# Patient Record
Sex: Female | Born: 1935 | Race: White | Hispanic: No | Smoking: Former smoker
Health system: Southern US, Community
[De-identification: ages and names within clinical notes are randomized; demographics above are authoritative.]

## PROBLEM LIST (undated history)

## (undated) DIAGNOSIS — I255 Ischemic cardiomyopathy: Secondary | ICD-10-CM

## (undated) DIAGNOSIS — I4891 Unspecified atrial fibrillation: Secondary | ICD-10-CM

## (undated) DIAGNOSIS — J449 Chronic obstructive pulmonary disease, unspecified: Secondary | ICD-10-CM

## (undated) DIAGNOSIS — I219 Acute myocardial infarction, unspecified: Secondary | ICD-10-CM

## (undated) DIAGNOSIS — B009 Herpesviral infection, unspecified: Secondary | ICD-10-CM

## (undated) DIAGNOSIS — D649 Anemia, unspecified: Secondary | ICD-10-CM

## (undated) DIAGNOSIS — I2510 Atherosclerotic heart disease of native coronary artery without angina pectoris: Secondary | ICD-10-CM

## (undated) DIAGNOSIS — E785 Hyperlipidemia, unspecified: Secondary | ICD-10-CM

## (undated) HISTORY — DX: Chronic obstructive pulmonary disease, unspecified: J44.9

## (undated) HISTORY — DX: Herpesviral infection, unspecified: B00.9

## (undated) HISTORY — DX: Anemia, unspecified: D64.9

## (undated) HISTORY — DX: Atherosclerotic heart disease of native coronary artery without angina pectoris: I25.10

## (undated) HISTORY — DX: Acute myocardial infarction, unspecified: I21.9

## (undated) HISTORY — PX: CATARACT EXTRACTION: SUR2

## (undated) HISTORY — PX: BREAST EXCISIONAL BIOPSY: SUR124

## (undated) HISTORY — DX: Unspecified atrial fibrillation: I48.91

## (undated) HISTORY — DX: Hyperlipidemia, unspecified: E78.5

## (undated) HISTORY — DX: Ischemic cardiomyopathy: I25.5

---

## 1993-07-27 DIAGNOSIS — I2510 Atherosclerotic heart disease of native coronary artery without angina pectoris: Secondary | ICD-10-CM

## 1993-07-27 HISTORY — DX: Atherosclerotic heart disease of native coronary artery without angina pectoris: I25.10

## 2004-10-16 ENCOUNTER — Ambulatory Visit: Payer: Self-pay | Admitting: Ophthalmology

## 2004-10-22 ENCOUNTER — Ambulatory Visit: Payer: Self-pay | Admitting: Ophthalmology

## 2005-01-23 ENCOUNTER — Ambulatory Visit: Payer: Self-pay | Admitting: Unknown Physician Specialty

## 2005-07-23 ENCOUNTER — Ambulatory Visit: Payer: Self-pay | Admitting: Gastroenterology

## 2006-01-24 HISTORY — PX: CAROTID ENDARTERECTOMY: SUR193

## 2006-02-18 ENCOUNTER — Ambulatory Visit: Payer: Self-pay | Admitting: Cardiology

## 2006-03-31 ENCOUNTER — Ambulatory Visit: Payer: Self-pay | Admitting: Cardiology

## 2006-04-12 ENCOUNTER — Ambulatory Visit: Payer: Self-pay | Admitting: Vascular Surgery

## 2006-04-14 ENCOUNTER — Other Ambulatory Visit: Payer: Self-pay

## 2006-04-14 ENCOUNTER — Ambulatory Visit: Payer: Self-pay | Admitting: Vascular Surgery

## 2006-04-19 ENCOUNTER — Inpatient Hospital Stay: Payer: Self-pay | Admitting: Vascular Surgery

## 2006-05-25 ENCOUNTER — Ambulatory Visit: Payer: Self-pay | Admitting: Unknown Physician Specialty

## 2007-05-27 ENCOUNTER — Ambulatory Visit: Payer: Self-pay | Admitting: Unknown Physician Specialty

## 2008-01-24 ENCOUNTER — Ambulatory Visit: Payer: Self-pay | Admitting: Unknown Physician Specialty

## 2008-10-03 ENCOUNTER — Ambulatory Visit: Payer: Self-pay | Admitting: Unknown Physician Specialty

## 2010-01-10 ENCOUNTER — Ambulatory Visit: Payer: Self-pay | Admitting: Unknown Physician Specialty

## 2010-06-26 DIAGNOSIS — I255 Ischemic cardiomyopathy: Secondary | ICD-10-CM

## 2010-06-26 HISTORY — DX: Ischemic cardiomyopathy: I25.5

## 2011-04-14 ENCOUNTER — Ambulatory Visit: Payer: Self-pay | Admitting: Unknown Physician Specialty

## 2011-04-14 LAB — HM MAMMOGRAPHY: HM Mammogram: NORMAL

## 2011-07-31 ENCOUNTER — Ambulatory Visit (INDEPENDENT_AMBULATORY_CARE_PROVIDER_SITE_OTHER): Payer: Medicare Other | Admitting: Internal Medicine

## 2011-07-31 ENCOUNTER — Encounter: Payer: Self-pay | Admitting: Internal Medicine

## 2011-07-31 DIAGNOSIS — IMO0001 Reserved for inherently not codable concepts without codable children: Secondary | ICD-10-CM

## 2011-07-31 DIAGNOSIS — I252 Old myocardial infarction: Secondary | ICD-10-CM | POA: Insufficient documentation

## 2011-07-31 DIAGNOSIS — Z1239 Encounter for other screening for malignant neoplasm of breast: Secondary | ICD-10-CM

## 2011-07-31 DIAGNOSIS — N813 Complete uterovaginal prolapse: Secondary | ICD-10-CM

## 2011-07-31 DIAGNOSIS — Z1211 Encounter for screening for malignant neoplasm of colon: Secondary | ICD-10-CM

## 2011-07-31 DIAGNOSIS — I2510 Atherosclerotic heart disease of native coronary artery without angina pectoris: Secondary | ICD-10-CM | POA: Insufficient documentation

## 2011-07-31 DIAGNOSIS — M791 Myalgia, unspecified site: Secondary | ICD-10-CM

## 2011-07-31 DIAGNOSIS — B009 Herpesviral infection, unspecified: Secondary | ICD-10-CM

## 2011-07-31 DIAGNOSIS — Z124 Encounter for screening for malignant neoplasm of cervix: Secondary | ICD-10-CM

## 2011-07-31 DIAGNOSIS — E785 Hyperlipidemia, unspecified: Secondary | ICD-10-CM

## 2011-07-31 DIAGNOSIS — D649 Anemia, unspecified: Secondary | ICD-10-CM

## 2011-07-31 MED ORDER — LISINOPRIL 40 MG PO TABS: 40.0000 mg | ORAL_TABLET | Freq: Every day | ORAL | Status: DC

## 2011-07-31 MED ORDER — ESTRADIOL 0.1 MG/GM VA CREA: 2.0000 g | TOPICAL_CREAM | VAGINAL | Status: DC

## 2011-07-31 MED ORDER — VITAMIN D3 75 MCG (3000 UT) PO TABS: 1.0000 | ORAL_TABLET | Freq: Every day | ORAL | Status: DC

## 2011-07-31 MED ORDER — ACYCLOVIR 400 MG PO TABS: 400.0000 mg | ORAL_TABLET | Freq: Every day | ORAL | Status: DC

## 2011-07-31 MED ORDER — PRAVASTATIN SODIUM 40 MG PO TABS: 40.0000 mg | ORAL_TABLET | Freq: Every day | ORAL | Status: DC

## 2011-07-31 MED ORDER — ISOSORBIDE MONONITRATE ER 30 MG PO TB24: 30.0000 mg | ORAL_TABLET | Freq: Every day | ORAL | Status: DC

## 2011-07-31 MED ORDER — FUROSEMIDE 20 MG PO TABS: 20.0000 mg | ORAL_TABLET | Freq: Every day | ORAL | Status: DC

## 2011-07-31 NOTE — Assessment & Plan Note (Addendum)
Last PAP June 2012,  [place],  Normal.  Has a pessary,put in by Patton Salles.

## 2011-07-31 NOTE — Assessment & Plan Note (Addendum)
With prior breast cyst taken out by [facility],, Last mammogram June 2012 at her annual physical

## 2011-07-31 NOTE — Progress Notes (Signed)
  Subjective:    Patient ID: Audrey Duran, female    DOB: 19-Aug-1934, 77 y.o.   MRN: [id number]  HPI   Audrey Duran is a  77 yo white female with history of glaucoma and CAD s/p AMI in 1994, managed medically by cardiologist Edmonia Lynch,  who is transferring care from [facility] due to loss of PCP [name].  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 Patton Salles.  She had not considered surgical intervention until now. She denies hematuria , frequency and back pain, but is aware of the pessary constantly and did not anticipate this feeling or discomnfort.  She denies chest pain, dyspnea with exertion, nausea, joint pain, and vision changes.     Past Medical History  Diagnosis Date  . Coronary artery disease 1995    s/p AMI , no history of stents,  [name]  . Myocardial infarction   . Hyperlipidemia    No current outpatient prescriptions on file prior to visit.    Review of Systems  Constitutional: Negative for fever, chills and unexpected weight change.  HENT: Negative for hearing loss, ear pain, nosebleeds, congestion, sore throat, facial swelling, rhinorrhea, sneezing, mouth sores, trouble swallowing, neck pain, neck stiffness, voice change, postnasal drip, sinus pressure, tinnitus and ear discharge.   Eyes: Negative for pain, discharge, redness and visual disturbance.  Respiratory: Negative for cough, chest tightness, shortness of breath, wheezing and stridor.   Cardiovascular: Negative for chest pain, palpitations and leg swelling.  Genitourinary: Positive for pelvic pain.  Musculoskeletal: Negative for myalgias and arthralgias.  Skin: Negative for color change and rash.  Neurological: Negative for  dizziness, weakness, light-headedness and headaches.  Hematological: Negative for adenopathy.       Objective:   Physical Exam  Constitutional: She is oriented to person, place, and time. She appears well-developed and well-nourished.  HENT:  Mouth/Throat: Oropharynx is clear and moist.  Eyes: EOM are normal. Pupils are equal, round, and reactive to light. No scleral icterus.  Neck: Normal range of motion. Neck supple. No JVD present. No thyromegaly present.  Cardiovascular: Normal rate, regular rhythm, normal heart sounds and intact distal pulses.   Pulmonary/Chest: Effort normal and breath sounds normal.  Abdominal: Soft. Bowel sounds are normal. She exhibits no mass. There is no tenderness.  Musculoskeletal: Normal range of motion. She exhibits no edema.  Lymphadenopathy:    She has no cervical adenopathy.  Neurological: She is alert and oriented to person, place, and time.  Skin: Skin is warm and dry.  Psychiatric: She has a normal mood and affect.          Assessment & Plan:

## 2011-07-31 NOTE — Patient Instructions (Signed)
I [facility] because [facility]

## 2011-08-01 LAB — COMPREHENSIVE METABOLIC PANEL
ALT: 13 U/L (ref 0–35)
AST: 19 U/L (ref 0–37)
Albumin: 4.1 g/dL (ref 3.5–5.2)
CO2: 19 mEq/L (ref 19–32)
Calcium: 10.3 mg/dL (ref 8.4–10.5)
Creat: 1.38 mg/dL — ABNORMAL HIGH (ref 0.50–1.10)
Total Protein: 7.1 g/dL (ref 6.0–8.3)

## 2011-08-01 LAB — CBC WITH DIFFERENTIAL/PLATELET
Basophils Relative: 1 % (ref 0–1)
Eosinophils Absolute: 0.2 10*3/uL (ref 0.0–0.7)
Eosinophils Relative: 2 % (ref 0–5)
HCT: 39.2 % (ref 36.0–46.0)
Hemoglobin: 12 g/dL (ref 12.0–15.0)
MCH: 29.2 pg (ref 26.0–34.0)
MCHC: 30.6 g/dL (ref 30.0–36.0)
MCV: 95.4 fL (ref 78.0–100.0)
Monocytes Absolute: 0.8 10*3/uL (ref 0.1–1.0)
Monocytes Relative: 8 % (ref 3–12)

## 2011-08-01 LAB — LIPID PANEL
HDL: 53 mg/dL (ref >39)
LDL Cholesterol: 92 mg/dL (ref 0–99)
Total CHOL/HDL Ratio: 2.9 Ratio
Triglycerides: 57 mg/dL (ref <150)

## 2011-08-01 LAB — IRON AND TIBC: TIBC: 341 ug/dL (ref 250–470)

## 2011-08-02 ENCOUNTER — Encounter: Payer: Self-pay | Admitting: Internal Medicine

## 2011-08-02 DIAGNOSIS — N813 Complete uterovaginal prolapse: Secondary | ICD-10-CM | POA: Insufficient documentation

## 2011-08-02 NOTE — Assessment & Plan Note (Signed)
Managed with pessary placement Sept 2012, by Patton Salles. Due to constant discomfort, we discussed urogynecologic evaluation by Dr. Doy Hutching at [facility].

## 2011-08-02 NOTE — Assessment & Plan Note (Signed)
Colnoscopy was done in 2007 at [facility], records requested.

## 2011-08-02 NOTE — Assessment & Plan Note (Addendum)
She takes pravastatin and had fasting lipids done this summer at [facility], which were repeated today.  LDL is 92.  LFTS are normal.  Denies myalgias.  Will discuss increasing ose of pravastatin to 80 mg for goal of 70

## 2011-08-02 NOTE — Assessment & Plan Note (Signed)
With prior AMI, managed medically per patient.  Records requesting . She is taking an ACE Inhibitor, aspirin, a statin, and a venodilator (Imdur) but no beta blocker due to resting bradycardia . Records requested.

## 2011-08-03 ENCOUNTER — Telehealth: Payer: Self-pay | Admitting: *Deleted

## 2011-08-03 DIAGNOSIS — B009 Herpesviral infection, unspecified: Secondary | ICD-10-CM

## 2011-08-03 MED ORDER — ACYCLOVIR 400 MG PO TABS: 400.0000 mg | ORAL_TABLET | Freq: Every day | ORAL | Status: DC

## 2011-08-03 NOTE — Telephone Encounter (Signed)
Advised pharmacy.

## 2011-08-03 NOTE — Telephone Encounter (Signed)
Audrey Duran is asking for clarification on zovirax script.  Script that was sent in has to take one tablet by mouth daily and also says to take 2 daily. Please advise.

## 2011-08-03 NOTE — Telephone Encounter (Signed)
Should be on e daily .  thanks

## 2011-08-10 ENCOUNTER — Other Ambulatory Visit: Payer: Self-pay | Admitting: *Deleted

## 2011-08-10 MED ORDER — PRAVASTATIN SODIUM 80 MG PO TABS: 80.0000 mg | ORAL_TABLET | Freq: Every day | ORAL | Status: DC

## 2011-08-18 ENCOUNTER — Other Ambulatory Visit: Payer: Self-pay | Admitting: *Deleted

## 2011-08-18 MED ORDER — PRAVASTATIN SODIUM 80 MG PO TABS: 80.0000 mg | ORAL_TABLET | Freq: Every day | ORAL | Status: DC

## 2011-08-18 NOTE — Telephone Encounter (Signed)
Faxed request from [facility] for 90 day supply.

## 2011-08-24 ENCOUNTER — Telehealth: Payer: Self-pay | Admitting: *Deleted

## 2011-08-24 NOTE — Telephone Encounter (Signed)
Pt called stating that when she got her pravastatin script it was for 80 mg's.  She says she has always taken 40 mg's.  Advised her that per her last office note Dr. Darrick Huntsman had wanted her to increase to 17 mg's daily to get her LDL down to 70.  Pt agreed, will take 80 mg's.

## 2011-10-08 ENCOUNTER — Other Ambulatory Visit: Payer: Self-pay | Admitting: Internal Medicine

## 2011-10-08 DIAGNOSIS — B009 Herpesviral infection, unspecified: Secondary | ICD-10-CM

## 2011-10-08 MED ORDER — ACYCLOVIR 400 MG PO TABS: 400.0000 mg | ORAL_TABLET | Freq: Every day | ORAL | Status: DC

## 2011-11-09 ENCOUNTER — Other Ambulatory Visit (INDEPENDENT_AMBULATORY_CARE_PROVIDER_SITE_OTHER): Payer: Medicare Other | Admitting: *Deleted

## 2011-11-09 DIAGNOSIS — E785 Hyperlipidemia, unspecified: Secondary | ICD-10-CM

## 2011-11-09 DIAGNOSIS — N289 Disorder of kidney and ureter, unspecified: Secondary | ICD-10-CM

## 2011-11-09 LAB — LIPID PANEL
Cholesterol: 131 mg/dL (ref 0–200)
LDL Cholesterol: 79 mg/dL (ref 0–99)
Triglycerides: 56 mg/dL (ref 0.0–149.0)
VLDL: 11.2 mg/dL (ref 0.0–40.0)

## 2011-11-09 LAB — COMPREHENSIVE METABOLIC PANEL
AST: 15 U/L (ref 0–37)
Alkaline Phosphatase: 57 U/L (ref 39–117)
BUN: 26 mg/dL — ABNORMAL HIGH (ref 6–23)
Creatinine, Ser: 1.1 mg/dL (ref 0.4–1.2)
Total Bilirubin: 0.4 mg/dL (ref 0.3–1.2)

## 2011-12-03 ENCOUNTER — Telehealth: Payer: Self-pay | Admitting: Internal Medicine

## 2011-12-03 DIAGNOSIS — K649 Unspecified hemorrhoids: Secondary | ICD-10-CM

## 2011-12-03 MED ORDER — HYDROCORTISONE ACETATE 25 MG RE SUPP
25.0000 mg | Freq: Two times a day (BID) | RECTAL | Status: AC
Start: 2011-12-03 — End: 2011-12-13

## 2011-12-03 NOTE — Telephone Encounter (Signed)
I have reviewed her prior office visit from January  and there is no mention of zanax on her medication list.  So I do not give out prescriptions for controlled substances over the phone.  I will be happy to dicuss this at her next visit. I will send an rx for suppositories to her pharmacy,

## 2011-12-03 NOTE — Telephone Encounter (Signed)
Patient is needing suppositories for hemorrhoids and a prescription for xanax 0.5 mg .

## 2011-12-04 MED ORDER — ALPRAZOLAM 0.5 MG PO TABS: 0.5000 mg | ORAL_TABLET | Freq: Every evening | ORAL | Status: DC | PRN

## 2011-12-04 NOTE — Telephone Encounter (Signed)
Patient stated Dr. Lin Givens prescribed xanax and she had a refill at the pharmacy so she picked that up as well as the suppositories.  I have added it to her med list.

## 2011-12-09 ENCOUNTER — Telehealth: Payer: Self-pay | Admitting: Internal Medicine

## 2011-12-09 MED ORDER — ISOSORBIDE MONONITRATE ER 60 MG PO TB24: 60.0000 mg | ORAL_TABLET | Freq: Every day | ORAL | Status: DC

## 2011-12-09 NOTE — Telephone Encounter (Signed)
Patient is asking for a refill, but has been taking two a day instead of one a day. Is it okay to fill?

## 2011-12-09 NOTE — Telephone Encounter (Signed)
Yes, i changed dose to 60 mg ,  #90 sent to [facility]

## 2011-12-09 NOTE — Telephone Encounter (Signed)
[phone] Pt called she is out of her isosorpmonotab 30mg   The rx says to take 1 day but pt has been taking 2 a day Pt is completely out of her meds [facility] [address] Please let pt know when this is called in

## 2011-12-10 ENCOUNTER — Telehealth: Payer: Self-pay | Admitting: Internal Medicine

## 2011-12-10 MED ORDER — ISOSORBIDE MONONITRATE ER 60 MG PO TB24: 60.0000 mg | ORAL_TABLET | Freq: Every day | ORAL | Status: DC

## 2011-12-10 NOTE — Telephone Encounter (Signed)
I tried calling patient to let her know I have sent in her Rx to [facility], but wanted to speak with her to ask if she needed a 30 day supply sent to [facility].  A voicemail was not set up, I will try calling again.

## 2011-12-10 NOTE — Telephone Encounter (Signed)
Patient has been taking her Generic Imdur 60 mg 24 hr. tab twice a day because she has always taken them like that ,but the prescription was written for daily and she has ran out of her medication and would like someone to call [facility] to let them know either the prescription was written wrong or let her know that the prescription has been changed and to take only one a day. Either way she will need a refill.

## 2011-12-16 NOTE — Telephone Encounter (Signed)
Patient stated the mail order has already come so she did not need the 30 day supply

## 2012-01-08 ENCOUNTER — Other Ambulatory Visit (INDEPENDENT_AMBULATORY_CARE_PROVIDER_SITE_OTHER): Payer: Medicare Other | Admitting: *Deleted

## 2012-01-08 DIAGNOSIS — N390 Urinary tract infection, site not specified: Secondary | ICD-10-CM

## 2012-01-08 LAB — POCT URINALYSIS DIPSTICK
Glucose, UA: NEGATIVE
Nitrite, UA: NEGATIVE
Urobilinogen, UA: 0.2

## 2012-01-10 LAB — URINE CULTURE

## 2012-01-11 ENCOUNTER — Other Ambulatory Visit: Payer: Self-pay | Admitting: Internal Medicine

## 2012-01-11 MED ORDER — CIPROFLOXACIN HCL 250 MG PO TABS: 250.0000 mg | ORAL_TABLET | Freq: Two times a day (BID) | ORAL | Status: AC

## 2012-01-13 ENCOUNTER — Encounter: Payer: Self-pay | Admitting: Internal Medicine

## 2012-01-13 ENCOUNTER — Ambulatory Visit (INDEPENDENT_AMBULATORY_CARE_PROVIDER_SITE_OTHER): Payer: Medicare Other | Admitting: Internal Medicine

## 2012-01-13 VITALS — BP 130/76 | HR 65 | Temp 97.5°F | Resp 16 | Wt 156.5 lb

## 2012-01-13 DIAGNOSIS — N390 Urinary tract infection, site not specified: Secondary | ICD-10-CM

## 2012-01-13 DIAGNOSIS — N760 Acute vaginitis: Secondary | ICD-10-CM

## 2012-01-13 MED ORDER — METRONIDAZOLE 500 MG PO TABS: 500.0000 mg | ORAL_TABLET | Freq: Two times a day (BID) | ORAL | Status: AC

## 2012-01-13 MED ORDER — ESTRADIOL 10 MCG VA TABS: 1.0000 | ORAL_TABLET | VAGINAL | Status: DC

## 2012-01-13 NOTE — Patient Instructions (Signed)
I am treating you with metronidazole for a vaginal infection for one week.  Once you have finished the antibiotic, you can start the estrogen vaginal tablet instead of the cream.

## 2012-01-14 LAB — POCT URINALYSIS DIPSTICK
Ketones, UA: NEGATIVE
Protein, UA: NEGATIVE
Spec Grav, UA: 1.01
pH, UA: 5.5

## 2012-01-17 ENCOUNTER — Encounter: Payer: Self-pay | Admitting: Internal Medicine

## 2012-01-17 DIAGNOSIS — I480 Paroxysmal atrial fibrillation: Secondary | ICD-10-CM | POA: Insufficient documentation

## 2012-01-17 DIAGNOSIS — D649 Anemia, unspecified: Secondary | ICD-10-CM | POA: Insufficient documentation

## 2012-01-17 DIAGNOSIS — B009 Herpesviral infection, unspecified: Secondary | ICD-10-CM | POA: Insufficient documentation

## 2012-01-17 DIAGNOSIS — J449 Chronic obstructive pulmonary disease, unspecified: Secondary | ICD-10-CM | POA: Insufficient documentation

## 2012-01-17 DIAGNOSIS — N952 Postmenopausal atrophic vaginitis: Secondary | ICD-10-CM | POA: Insufficient documentation

## 2012-01-17 DIAGNOSIS — I255 Ischemic cardiomyopathy: Secondary | ICD-10-CM | POA: Insufficient documentation

## 2012-01-17 NOTE — Assessment & Plan Note (Signed)
Secondary to fomite (pessary). Vaginal culture sent.  Will treat with metronidazole for one week.

## 2012-01-17 NOTE — Progress Notes (Signed)
Patient ID: Audrey Duran, female   DOB: May 18, 1935, 77 y.o.   MRN: [id number]   Patient Active Problem List  Diagnosis  . Coronary artery disease  . Myocardial infarction  . Hyperlipidemia  . Screening for breast cancer  . Screening for cervical cancer  . Screening for colon cancer  . 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 Vaginitis    Subjective:  CC:   Chief Complaint  Patient presents with  . Dysuria  . Urinary Frequency    HPI:   Audrey Duran a 77 y.o. female who presents Persistent vaginal discomfort .  Patient had her pessary removed by Dr. Greggory Keen and vaginal irritation was noted at the time but no antibiotcs were given. Patient has submitted a urine in recetn days which was negative for infection but returns for continued symptomss of burning .    Past Medical History  Diagnosis Date  . Coronary artery disease 1995    s/p AMI , no history of stents,  [name]  . Myocardial infarction   . Hyperlipidemia     History reviewed. No pertinent past surgical history.       The following portions of the patient's history were reviewed and updated as appropriate: Allergies, current medications, and problem list.    Review of Systems:  The remainder of a comprehensive review of systems was negative except those addressed in the HPI,     History   Social History  . Marital Status: Divorced    Spouse Name: N/A    Number of Children: N/A  . Years of Education: N/A   Occupational History  . Not on file.   Social History Main Topics  . Smoking status: Former Smoker    Quit date: 01/13/1992  . Smokeless tobacco: Never Used  . Alcohol Use: No  . Drug Use: No  . Sexually Active: Not on file   Other Topics Concern  . Not on file   Social History Narrative  . No narrative on file    Objective:  BP 130/76  Pulse 65  Temp 97.5 F (36.4 C) (Oral)  Resp 16  Wt 156 lb 8 oz (70.988 kg)  SpO2 97%  General appearance:  alert, cooperative and appears stated age Ears: normal TM's and external ear canals both ears Throat: lips, mucosa, and tongue normal; teeth and gums normal Neck: no adenopathy, no carotid bruit, supple, symmetrical, trachea midline and thyroid not enlarged, symmetric, no tenderness/mass/nodules Back: symmetric, no curvature. ROM normal. No CVA tenderness. Lungs: clear to auscultation bilaterally Heart: regular rate and rhythm, S1, S2 normal, no murmur, click, rub or gallop Abdomen: soft, non-tender; bowel sounds normal; no masses,  no organomegaly Pulses: 2+ and symmetric Skin: Skin color, texture, turgor normal. No rashes or lesions Lymph nodes: Cervical, supraclavicular, and axillary nodes normal. Pelvic,  Patchy erythema of vaginal mucosa with yellow discharge.   Assessment and Plan:  Vaginitis Secondary to fomite (pessary). Vaginal culture sent.  Will treat with metronidazole for one week.    Updated Medication List Outpatient Encounter Prescriptions as of 01/13/2012  Medication Sig Dispense Refill  . acyclovir (ZOVIRAX) 400 MG tablet Take 1 tablet (400 mg total) by mouth daily.  90 tablet  3  . aspirin 81 MG tablet Take 160 mg by mouth daily.        . carbonyl iron (FEOSOL) 45 MG TABS Take one by mouth daily      . Cholecalciferol (VITAMIN D3) 3000 UNITS TABS Take 1 tablet by  mouth daily. Take one by mouth daily  90 tablet  3  . ciprofloxacin (CIPRO) 250 MG tablet Take 1 tablet (250 mg total) by mouth 2 (two) times daily.  6 tablet  0  . cyanocobalamin 1000 MCG tablet Take one by mouth daily       . furosemide (LASIX) 20 MG tablet Take 1 tablet (20 mg total) by mouth daily. Take one half tablet by mouth daily  90 tablet  3  . isosorbide mononitrate (IMDUR) 60 MG 24 hr tablet Take 1 tablet (60 mg total) by mouth daily.  90 tablet  3  . lisinopril (PRINIVIL,ZESTRIL) 40 MG tablet Take 1 tablet (40 mg total) by mouth daily.  90 tablet  3  . magnesium oxide (MAG-OX) 400 MG tablet Take  400 mg by mouth daily.        . Omega-3 Fatty Acids (FISH OIL) 1200 MG CAPS Take one by mouth daily       . pravastatin (PRAVACHOL) 80 MG tablet Take 1 tablet (80 mg total) by mouth daily.  90 tablet  3  . TIMOLOL MALEATE PF OP Use one drop both eyes 2 times a day       . DISCONTD: estradiol (ESTRACE VAGINAL) 0.1 MG/GM vaginal cream Place 2 g vaginally 2 (two) times a week. Use 2 times a week  42.5 g  3  . Estradiol (VAGIFEM) 10 MCG TABS Place 1 tablet (10 mcg total) vaginally every 3 (three) days.  10 tablet  3  . metroNIDAZOLE (FLAGYL) 500 MG tablet Take 1 tablet (500 mg total) by mouth 2 (two) times daily.  14 tablet  0  . DISCONTD: pravastatin (PRAVACHOL) 80 MG tablet Take 1 tablet (80 mg total) by mouth daily.  90 tablet  3     Orders Placed This Encounter  Procedures  . Urine Culture  . Culture, routine-genital  . POCT Urinalysis Dipstick    No Follow-up on file.

## 2012-01-19 ENCOUNTER — Telehealth: Payer: Self-pay | Admitting: Internal Medicine

## 2012-01-19 NOTE — Telephone Encounter (Signed)
Pts drug store called and told her the med that dr Darrick Huntsman called in last Friday to [facility] would cost her 175  For 24 pills.  Pt wanted to know if she could use the estrogen cream that she already has or can you call her in something else

## 2012-01-19 NOTE — Telephone Encounter (Signed)
Yes use the estrogen cream she has.  3 times weekly

## 2012-01-19 NOTE — Telephone Encounter (Signed)
Patient notified

## 2012-01-25 HISTORY — PX: JOINT REPLACEMENT: SHX530

## 2012-02-16 ENCOUNTER — Inpatient Hospital Stay: Payer: Self-pay | Admitting: Specialist

## 2012-02-16 LAB — CBC WITH DIFFERENTIAL/PLATELET
Basophil #: 0 10*3/uL (ref 0.0–0.1)
HCT: 36.9 % (ref 35.0–47.0)
HGB: 11.7 g/dL — ABNORMAL LOW (ref 12.0–16.0)
Lymphocyte #: 0.9 10*3/uL — ABNORMAL LOW (ref 1.0–3.6)
MCH: 29.4 pg (ref 26.0–34.0)
MCV: 93 fL (ref 80–100)
Monocyte #: 0.6 x10 3/mm (ref 0.2–0.9)
Monocyte %: 3.5 %
Neutrophil #: 16.7 10*3/uL — ABNORMAL HIGH (ref 1.4–6.5)
Platelet: 182 10*3/uL (ref 150–440)
RDW: 13.7 % (ref 11.5–14.5)
WBC: 18.2 10*3/uL — ABNORMAL HIGH (ref 3.6–11.0)

## 2012-02-16 LAB — BASIC METABOLIC PANEL
Anion Gap: 6 — ABNORMAL LOW (ref 7–16)
BUN: 30 mg/dL — ABNORMAL HIGH (ref 7–18)
Chloride: 102 mmol/L (ref 98–107)
EGFR (Non-African Amer.): 43 — ABNORMAL LOW
Glucose: 148 mg/dL — ABNORMAL HIGH (ref 65–99)
Osmolality: 275 (ref 275–301)
Potassium: 4.8 mmol/L (ref 3.5–5.1)
Sodium: 133 mmol/L — ABNORMAL LOW (ref 136–145)

## 2012-02-16 LAB — CK: CK, Total: 79 U/L (ref 21–215)

## 2012-02-17 LAB — URINALYSIS, COMPLETE
Hyaline Cast: 4
Ketone: NEGATIVE
Nitrite: NEGATIVE
Ph: 5 (ref 4.5–8.0)
Protein: NEGATIVE
RBC,UR: 3 /HPF (ref 0–5)
WBC UR: 25 /HPF (ref 0–5)

## 2012-02-17 LAB — BASIC METABOLIC PANEL
BUN: 26 mg/dL — ABNORMAL HIGH (ref 7–18)
Creatinine: 1.3 mg/dL (ref 0.60–1.30)
EGFR (Non-African Amer.): 40 — ABNORMAL LOW
Glucose: 116 mg/dL — ABNORMAL HIGH (ref 65–99)
Osmolality: 270 (ref 275–301)

## 2012-02-17 LAB — CBC WITH DIFFERENTIAL/PLATELET
Eosinophil #: 0 10*3/uL (ref 0.0–0.7)
Eosinophil %: 0.3 %
HCT: 35.4 % (ref 35.0–47.0)
Lymphocyte #: 0.9 10*3/uL — ABNORMAL LOW (ref 1.0–3.6)
Lymphocyte %: 6.6 %
MCV: 91 fL (ref 80–100)
Monocyte %: 5.6 %
Neutrophil #: 11.2 10*3/uL — ABNORMAL HIGH (ref 1.4–6.5)
Neutrophil %: 87.5 %
RBC: 3.87 10*6/uL (ref 3.80–5.20)
RDW: 13.5 % (ref 11.5–14.5)
WBC: 12.8 10*3/uL — ABNORMAL HIGH (ref 3.6–11.0)

## 2012-02-17 LAB — HEMOGLOBIN A1C: Hemoglobin A1C: 5.7 % (ref 4.2–6.3)

## 2012-02-17 LAB — PROTIME-INR: Prothrombin Time: 13.7 secs (ref 11.5–14.7)

## 2012-02-17 LAB — CK TOTAL AND CKMB (NOT AT ARMC): CK-MB: 0.9 ng/mL (ref 0.5–3.6)

## 2012-02-17 LAB — MAGNESIUM: Magnesium: 1.5 mg/dL — ABNORMAL LOW

## 2012-02-17 LAB — TROPONIN I: Troponin-I: <0.02 ng/mL

## 2012-02-18 LAB — CBC WITH DIFFERENTIAL/PLATELET
Basophil %: 0.2 %
Eosinophil #: 0.1 10*3/uL (ref 0.0–0.7)
Eosinophil %: 0.9 %
HCT: 29.8 % — ABNORMAL LOW (ref 35.0–47.0)
HGB: 9.6 g/dL — ABNORMAL LOW (ref 12.0–16.0)
Lymphocyte #: 1 10*3/uL (ref 1.0–3.6)
Lymphocyte %: 7.5 %
MCHC: 32.2 g/dL (ref 32.0–36.0)
MCV: 93 fL (ref 80–100)
Monocyte %: 7.6 %
Neutrophil #: 11.3 10*3/uL — ABNORMAL HIGH (ref 1.4–6.5)
Neutrophil %: 83.8 %
RBC: 3.21 10*6/uL — ABNORMAL LOW (ref 3.80–5.20)

## 2012-02-18 LAB — BASIC METABOLIC PANEL
Calcium, Total: 8.5 mg/dL (ref 8.5–10.1)
Chloride: 102 mmol/L (ref 98–107)
Co2: 24 mmol/L (ref 21–32)
Creatinine: 1.27 mg/dL (ref 0.60–1.30)
EGFR (Non-African Amer.): 42 — ABNORMAL LOW
Glucose: 110 mg/dL — ABNORMAL HIGH (ref 65–99)
Potassium: 4.7 mmol/L (ref 3.5–5.1)
Sodium: 133 mmol/L — ABNORMAL LOW (ref 136–145)

## 2012-02-19 ENCOUNTER — Encounter: Payer: Self-pay | Admitting: Internal Medicine

## 2012-02-19 LAB — HEMOGLOBIN: HGB: 9.1 g/dL — ABNORMAL LOW (ref 12.0–16.0)

## 2012-02-25 ENCOUNTER — Encounter: Payer: Self-pay | Admitting: Internal Medicine

## 2012-04-11 ENCOUNTER — Encounter: Payer: Self-pay | Admitting: Specialist

## 2012-04-25 ENCOUNTER — Encounter: Payer: Self-pay | Admitting: Internal Medicine

## 2012-04-25 ENCOUNTER — Ambulatory Visit (INDEPENDENT_AMBULATORY_CARE_PROVIDER_SITE_OTHER): Payer: Medicare Other | Admitting: Internal Medicine

## 2012-04-25 VITALS — BP 138/72 | HR 60 | Temp 97.6°F | Ht 63.5 in | Wt 148.5 lb

## 2012-04-25 DIAGNOSIS — Z1239 Encounter for other screening for malignant neoplasm of breast: Secondary | ICD-10-CM

## 2012-04-25 DIAGNOSIS — E785 Hyperlipidemia, unspecified: Secondary | ICD-10-CM

## 2012-04-25 DIAGNOSIS — I2510 Atherosclerotic heart disease of native coronary artery without angina pectoris: Secondary | ICD-10-CM

## 2012-04-25 DIAGNOSIS — D649 Anemia, unspecified: Secondary | ICD-10-CM

## 2012-04-25 DIAGNOSIS — Z1211 Encounter for screening for malignant neoplasm of colon: Secondary | ICD-10-CM

## 2012-04-25 DIAGNOSIS — Z0000 Encounter for general adult medical examination without abnormal findings: Secondary | ICD-10-CM | POA: Insufficient documentation

## 2012-04-25 DIAGNOSIS — R5383 Other fatigue: Secondary | ICD-10-CM

## 2012-04-25 DIAGNOSIS — E559 Vitamin D deficiency, unspecified: Secondary | ICD-10-CM

## 2012-04-25 LAB — CBC WITH DIFFERENTIAL/PLATELET
Basophils Relative: 0.5 % (ref 0.0–3.0)
Eosinophils Absolute: 0.3 10*3/uL (ref 0.0–0.7)
Eosinophils Relative: 3.7 % (ref 0.0–5.0)
Hemoglobin: 12.1 g/dL (ref 12.0–15.0)
Lymphocytes Relative: 22.4 % (ref 12.0–46.0)
MCHC: 32.3 g/dL (ref 30.0–36.0)
MCV: 90.7 fl (ref 78.0–100.0)
Neutro Abs: 5.3 10*3/uL (ref 1.4–7.7)
RBC: 4.12 Mil/uL (ref 3.87–5.11)
WBC: 8.1 10*3/uL (ref 4.5–10.5)

## 2012-04-25 LAB — TSH: TSH: 3.28 u[IU]/mL (ref 0.35–5.50)

## 2012-04-25 LAB — COMPREHENSIVE METABOLIC PANEL
BUN: 15 mg/dL (ref 6–23)
CO2: 26 mEq/L (ref 19–32)
Creatinine, Ser: 1.2 mg/dL (ref 0.4–1.2)
GFR: 47.48 mL/min — ABNORMAL LOW (ref >60.00)
Glucose, Bld: 103 mg/dL — ABNORMAL HIGH (ref 70–99)
Total Bilirubin: 0.4 mg/dL (ref 0.3–1.2)
Total Protein: 7.1 g/dL (ref 6.0–8.3)

## 2012-04-25 LAB — LIPID PANEL
Cholesterol: 149 mg/dL (ref 0–200)
HDL: 48.9 mg/dL (ref >39.00)
Triglycerides: 75 mg/dL (ref 0.0–149.0)
VLDL: 15 mg/dL (ref 0.0–40.0)

## 2012-04-25 NOTE — Assessment & Plan Note (Signed)
She is currently asymptomatic and went to her total hip replacement in July with no perioperative complications.

## 2012-04-25 NOTE — Assessment & Plan Note (Signed)
Colonoscopy was normal except for large internal hemorrhoids December 2006. She will be due for repeat in 2016 if she chooses repeat

## 2012-04-25 NOTE — Assessment & Plan Note (Signed)
Breast exam was normal.  Mammogram ordered  

## 2012-04-25 NOTE — Progress Notes (Signed)
Patient ID: Audrey Duran, female   DOB: 1934/11/09, 77 y.o.   MRN: [id number]  The patient is here for annual Medicare wellness examination and management of other chronic and acute problems. She suffered a right fracture while filling up her lawn mower in July and underwent a ORIF on  02/16/12 Audrey Duran. The incident occurred at 7 PM and she was down  on the cement for 3 hrs until somebody came to her rescue. Still having pain in the morning worse when she first gets up a week and resolves by 10:30 or 11. She takes 2 ibuprofen and occasional nighttime Tylenol. Prior trial of tramadol caused nausea. She notes that her leg feels better when she has the electric blanket on but has not tried a heating pad. She gets in 3 weeks at [facility] for inpatient rehabilitation followed by home physical therapy and is still performing exercises given to her by the physical therapist. She has not fallen since July.  She does live alone has not purchased  A [employer] Alert yet.    The risk factors are reflected in the social history.  The roster of all physicians providing medical care to patient - is listed in the Snapshot section of the chart.  Activities of daily living:  The patient is 100% independent in all ADLs: dressing, toileting, feeding as well as independent mobility  Home safety : The patient has smoke detectors in the home. They wear seatbelts.  There are no firearms at home. There is no violence in the home.   There is no risks for hepatitis, STDs or HIV. There is no   history of blood transfusion. They have no travel history to infectious disease endemic areas of the world.  The patient has seen their dentist in the last six month. They have seen their eye doctor in the last year. They admit to slight hearing difficulty with regard to whispered voices and some television programs.  They have deferred audiologic testing in the last year.  They do not  have excessive sun exposure. Discussed the need for  sun protection: hats, long sleeves and use of sunscreen if there is significant sun exposure.   Diet: the importance of a healthy diet is discussed. They do have a healthy diet.  The benefits of regular aerobic exercise were discussed. She walks 4 times per week ,  20 minutes.   Depression screen: there are no signs or vegative symptoms of depression- irritability, change in appetite, anhedonia, sadness/tearfullness.  Cognitive assessment: the patient manages all their financial and personal affairs and is actively engaged. They could relate day,date,year and events; recalled 2/3 objects at 3 minutes; performed clock-face test normally.  The following portions of the patient's history were reviewed and updated as appropriate: allergies, current medications, past family history, past medical history,  past surgical history, past social history  and problem list.  Visual acuity was not assessed per patient preference since she has regular follow up with her ophthalmologist. Hearing and body mass index were assessed and reviewed.   During the course of the visit the patient was educated and counseled about appropriate screening and preventive services including : fall prevention , diabetes screening, nutrition counseling, colorectal cancer screening, and recommended immunizations.    Objective  BP 138/72  Pulse 60  Temp 97.6 F (36.4 C) (Oral)  Ht 5' 3.5" (1.613 m)  Wt 148 lb 8 oz (67.359 kg)  BMI 25.89 kg/m2  SpO2 95%  General appearance: alert, cooperative and appears  stated age Ears: normal TM's and external ear canals both ears Throat: lips, mucosa, and tongue normal; teeth and gums normal Neck: no adenopathy, no carotid bruit, supple, symmetrical, trachea midline and thyroid not enlarged, symmetric, no tenderness/mass/nodules Back: symmetric, no curvature. ROM normal. No CVA tenderness Breasts: breasts appear normal, no suspicious masses, no skin or nipple changes or axillary  nodes.. Lungs: clear to auscultation bilaterally Heart: regular rate and rhythm, S1, S2 normal, no murmur, click, rub or gallop Abdomen: soft, non-tender; bowel sounds normal; no masses,  no organomegaly Pulses: 2+ and symmetric Skin: Skin color, texture, turgor normal. No rashes or lesions Pelvic : deferred due to hip pain  Lymph nodes: Cervical, supraclavicular, and axillary nodes normal.  Assessment and plan  Hyperlipidemia  well controlled currently. LDL is 85. No changes to medication.  Coronary artery disease She is currently asymptomatic and went to her total hip replacement in July with no perioperative complications.  Screening for colon cancer Colonoscopy was normal except for large internal hemorrhoids December 2006. She will be due for repeat in 2016 if she chooses repeat  Routine general medical examination at a health care facility Breast exam was normal. Mammogram ordered.   Updated Medication List Outpatient Encounter Prescriptions as of 04/25/2012  Medication Sig Dispense Refill  . acyclovir (ZOVIRAX) 400 MG tablet Take 1 tablet (400 mg total) by mouth daily.  90 tablet  3  . aspirin 81 MG tablet Take 160 mg by mouth daily.        . carbonyl iron (FEOSOL) 45 MG TABS Take one by mouth daily      . Cholecalciferol (VITAMIN D3) 3000 UNITS TABS Take 1 tablet by mouth daily. Take one by mouth daily  90 tablet  3  . cyanocobalamin 1000 MCG tablet Take one by mouth daily       . Estradiol (VAGIFEM) 10 MCG TABS Place 1 tablet (10 mcg total) vaginally every 3 (three) days.  10 tablet  3  . furosemide (LASIX) 20 MG tablet Take 1 tablet (20 mg total) by mouth daily. Take one half tablet by mouth daily  90 tablet  3  . isosorbide mononitrate (IMDUR) 60 MG 24 hr tablet Take 1 tablet (60 mg total) by mouth daily.  90 tablet  3  . lisinopril (PRINIVIL,ZESTRIL) 40 MG tablet Take 1 tablet (40 mg total) by mouth daily.  90 tablet  3  . magnesium oxide (MAG-OX) 400 MG tablet Take 400  mg by mouth daily.        . Omega-3 Fatty Acids (FISH OIL) 1200 MG CAPS Take one by mouth daily       . pravastatin (PRAVACHOL) 80 MG tablet Take 1 tablet (80 mg total) by mouth daily.  90 tablet  3  . TIMOLOL MALEATE PF OP Use one drop both eyes 2 times a day       . traMADol (ULTRAM) 50 MG tablet

## 2012-04-25 NOTE — Assessment & Plan Note (Signed)
well controlled currently. LDL is 85. No changes to medication.

## 2012-04-25 NOTE — Patient Instructions (Signed)
You can try taking 3 ibuprofen and 1  tylenol at bedtime and once again during the day .  Please try using Debrox drops in your right ear to soften your ear wax.  Return for an ear wax flushing if no results in a week or so.

## 2012-04-26 ENCOUNTER — Other Ambulatory Visit: Payer: Medicare Other

## 2012-04-26 ENCOUNTER — Encounter: Payer: Self-pay | Admitting: Specialist

## 2012-04-26 NOTE — Addendum Note (Signed)
Addended by: Mauri Reading on: 04/26/2012 12:26 PM   Modules accepted: Orders

## 2012-04-28 LAB — URINE CULTURE

## 2012-04-29 NOTE — Progress Notes (Signed)
Quick Note:  Patient Informed and voiced understanding ______ 

## 2012-05-20 LAB — HM MAMMOGRAPHY: HM Mammogram: NORMAL

## 2012-05-25 ENCOUNTER — Ambulatory Visit: Payer: Self-pay | Admitting: Internal Medicine

## 2012-06-03 ENCOUNTER — Encounter: Payer: Self-pay | Admitting: Internal Medicine

## 2012-06-20 ENCOUNTER — Encounter: Payer: Self-pay | Admitting: Internal Medicine

## 2012-06-20 ENCOUNTER — Ambulatory Visit (INDEPENDENT_AMBULATORY_CARE_PROVIDER_SITE_OTHER): Payer: Medicare Other | Admitting: Internal Medicine

## 2012-06-20 VITALS — BP 138/64 | HR 58 | Temp 97.7°F | Resp 12 | Ht 64.0 in | Wt 148.8 lb

## 2012-06-20 DIAGNOSIS — I4891 Unspecified atrial fibrillation: Secondary | ICD-10-CM

## 2012-06-20 DIAGNOSIS — E785 Hyperlipidemia, unspecified: Secondary | ICD-10-CM

## 2012-06-20 DIAGNOSIS — S72009A Fracture of unspecified part of neck of unspecified femur, initial encounter for closed fracture: Secondary | ICD-10-CM

## 2012-06-20 DIAGNOSIS — Z13820 Encounter for screening for osteoporosis: Secondary | ICD-10-CM

## 2012-06-20 DIAGNOSIS — Z9181 History of falling: Secondary | ICD-10-CM

## 2012-06-20 DIAGNOSIS — M8580 Other specified disorders of bone density and structure, unspecified site: Secondary | ICD-10-CM | POA: Insufficient documentation

## 2012-06-20 MED ORDER — ALENDRONATE SODIUM 70 MG PO TABS
70.0000 mg | ORAL_TABLET | ORAL | Status: DC
Start: 2012-06-20 — End: 2014-11-07

## 2012-06-20 NOTE — Assessment & Plan Note (Signed)
Patient her strongly to obtain the  [employer] alert and to wear it  24/7 since she lives alone and spent 3 hours on the ground after fracturing her hip in September

## 2012-06-20 NOTE — Assessment & Plan Note (Signed)
LDL is 85 on current dose of statin. No changes today. Liver function tests are due.

## 2012-06-20 NOTE — Assessment & Plan Note (Signed)
Suspected, given her recent hip fracture which occurred while putting gas in her lawnmower. It is unclear whether he broke and which caused her fall or whether she fell and broke her hip. She is requesting therapy. We discussed starting alendronate. Baseline DEXA scan ordered. A

## 2012-06-20 NOTE — Assessment & Plan Note (Signed)
Rate control. No changes today. No anticoagulation given fall risk.

## 2012-06-20 NOTE — Progress Notes (Signed)
Patient ID: Audrey Duran, female   DOB: January 28, 1935, 77 y.o.   MRN: [id number] Patient Active Problem List  Diagnosis  . Coronary artery disease  . Myocardial infarction  . Hyperlipidemia  . Screening for breast cancer  . Screening for cervical cancer  . Screening for colon cancer  . 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 Vaginitis  . Atrial fibrillation  . Anemia  . Recurrent HSV (herpes simplex virus)  . COPD (chronic obstructive pulmonary disease)  . Ischemic cardiomyopathy  . Routine general medical examination at a health care facility  . Osteoporosis screening  . At high risk for falls    Subjective:  CC:   Chief Complaint  Patient presents with  . Follow-up    HPI:   Audrey Duran a 77 y.o. female who presents for follow up on chronic issues,  Including atrial fibrillation  cardiomyopathy and hyperlipidemia.  She is still limping from recent hip fracture in Sept which required ORIF and rehab,  Still doing home exercises. . Wants to start medication for osteoporosis.  Has not had a bone density test in several years.  Has not purchased a [employer] yet despite suffering for 3 hours  After her fall because she was alone and could not reach her phone.  Has no cell phone either .   Past Medical History  Diagnosis Date  . Coronary artery disease 1995    s/p AMI , no history of stents,  [name]  . Myocardial infarction   . Atrial fibrillation   . Anemia   . Hyperlipidemia   . Recurrent HSV (herpes simplex virus)   . COPD (chronic obstructive pulmonary disease)   . Ischemic cardiomyopathy Dec 2011    ETT Sestamibi study apical scar, no ischemia, [name]    Past Surgical History  Procedure Date  . Cataract extraction 2004, 2006  . Carotid endarterectomy July 2007    Dew, right carotid  . Joint replacement July 2013    total hip , Hyacinth Meeker    The following portions of the patient's history were reviewed and updated as appropriate:  Allergies, current medications, and problem list.    Review of Systems:   Patient denies headache, fevers, malaise, unintentional weight loss, skin rash, eye pain, sinus congestion and sinus pain, sore throat, dysphagia,  hemoptysis , cough, dyspnea, wheezing, chest pain, palpitations, orthopnea, edema, abdominal pain, nausea, melena, diarrhea, constipation, flank pain, dysuria, hematuria, urinary  Frequency, nocturia, numbness, tingling, seizures,  Focal weakness, Loss of consciousness,  Tremor, insomnia, depression, anxiety, and suicidal ideation.        History   Social History  . Marital Status: Divorced    Spouse Name: N/A    Number of Children: N/A  . Years of Education: N/A   Occupational History  . Not on file.   Social History Main Topics  . Smoking status: Former Smoker    Quit date: 01/13/1992  . Smokeless tobacco: Never Used  . Alcohol Use: No  . Drug Use: No  . Sexually Active: Not on file   Other Topics Concern  . Not on file   Social History Narrative  . No narrative on file    Objective:  BP 138/64  Pulse 58  Temp 97.7 F (36.5 C) (Oral)  Resp 12  Ht 5\' 4"  (1.626 m)  Wt 148 lb 12 oz (67.473 kg)  BMI 25.53 kg/m2  SpO2 97%  General appearance: alert, cooperative and appears stated age Ears: normal TM's and external ear  canals both ears Throat: lips, mucosa, and tongue normal; teeth and gums normal Neck: no adenopathy, no carotid bruit, supple, symmetrical, trachea midline and thyroid not enlarged, symmetric, no tenderness/mass/nodules Back: symmetric, no curvature. ROM normal. No CVA tenderness. Lungs: clear to auscultation bilaterally Heart: regular rate and rhythm, S1, S2 normal, no murmur, click, rub or gallop Abdomen: soft, non-tender; bowel sounds normal; no masses,  no organomegaly Pulses: 2+ and symmetric Skin: Skin color, texture, turgor normal. No rashes or lesions Lymph nodes: Cervical, supraclavicular, and axillary nodes  normal.  Assessment and Plan:  Atrial fibrillation Rate control. No changes today. No anticoagulation given fall risk.  Hyperlipidemia LDL is 85 on current dose of statin. No changes today. Liver function tests are due.  Osteoporosis screening Suspected, given her recent hip fracture which occurred while putting gas in her lawnmower. It is unclear whether he broke and which caused her fall or whether she fell and broke her hip. She is requesting therapy. We discussed starting alendronate. Baseline DEXA scan ordered. A  At high risk for falls Patient her strongly to obtain the  [employer] alert and to wear it  24/7 since she lives alone and spent 3 hours on the ground after fracturing her hip in September   Updated Medication List Outpatient Encounter Prescriptions as of 06/20/2012  Medication Sig Dispense Refill  . acyclovir (ZOVIRAX) 400 MG tablet Take 1 tablet (400 mg total) by mouth daily.  90 tablet  3  . aspirin 81 MG tablet Take 160 mg by mouth daily.        . carbonyl iron (FEOSOL) 45 MG TABS Take one by mouth daily      . Cholecalciferol (VITAMIN D3) 3000 UNITS TABS Take 1 tablet by mouth daily. Take one by mouth daily  90 tablet  3  . cyanocobalamin 1000 MCG tablet Take one by mouth daily       . Estradiol (VAGIFEM) 10 MCG TABS Place 1 tablet (10 mcg total) vaginally every 3 (three) days.  10 tablet  3  . furosemide (LASIX) 20 MG tablet Take 1 tablet (20 mg total) by mouth daily. Take one half tablet by mouth daily  90 tablet  3  . isosorbide mononitrate (IMDUR) 60 MG 24 hr tablet Take 1 tablet (60 mg total) by mouth daily.  90 tablet  3  . lisinopril (PRINIVIL,ZESTRIL) 40 MG tablet Take 1 tablet (40 mg total) by mouth daily.  90 tablet  3  . magnesium oxide (MAG-OX) 400 MG tablet Take 400 mg by mouth daily.        . Omega-3 Fatty Acids (FISH OIL) 1200 MG CAPS Take one by mouth daily       . pravastatin (PRAVACHOL) 80 MG tablet Take 1 tablet (80 mg total) by mouth daily.  90  tablet  3  . TIMOLOL MALEATE PF OP Use one drop both eyes 2 times a day       . traMADol (ULTRAM) 50 MG tablet       . alendronate (FOSAMAX) 70 MG tablet Take 1 tablet (70 mg total) by mouth every 7 (seven) days. Take with a full glass of water on an empty stomach.  4 tablet  11     Orders Placed This Encounter  Procedures  . DG Bone Density  . HM MAMMOGRAPHY    No Follow-up on file.

## 2012-08-01 ENCOUNTER — Other Ambulatory Visit: Payer: Self-pay | Admitting: Internal Medicine

## 2012-08-01 NOTE — Telephone Encounter (Signed)
Estrace Vag Cream 0.1 mg/gm  Place 2 grams vaginally 2 times a week  # 90 day supply   Pravastatin 80 mg tab   Take 1 by mouth daily  # 90

## 2012-08-02 NOTE — Telephone Encounter (Signed)
LMOVM for pt to return call regarding if patient is on estradiol or estrace.

## 2012-08-05 ENCOUNTER — Telehealth: Payer: Self-pay | Admitting: Internal Medicine

## 2012-08-05 NOTE — Telephone Encounter (Signed)
Have called the pt on three encounters to get this information.

## 2012-08-05 NOTE — Telephone Encounter (Signed)
[facility]  estradiol (ESTRACE VAGINAL) 0.1 MG/GM vaginal cream  #90  pravastatin (PRAVACHOL) 40 MG tablet   #90

## 2012-08-06 MED ORDER — ESTRADIOL 10 MCG VA TABS: 1.0000 | ORAL_TABLET | VAGINAL | Status: DC

## 2012-08-06 MED ORDER — PRAVASTATIN SODIUM 80 MG PO TABS: 80.0000 mg | ORAL_TABLET | Freq: Every day | ORAL | Status: DC

## 2012-08-06 NOTE — Telephone Encounter (Signed)
Ok to refill the estradiol cream for 90 days?

## 2012-08-06 NOTE — Telephone Encounter (Signed)
Refilled,

## 2012-08-12 ENCOUNTER — Other Ambulatory Visit: Payer: Self-pay | Admitting: Internal Medicine

## 2012-08-12 DIAGNOSIS — B009 Herpesviral infection, unspecified: Secondary | ICD-10-CM

## 2012-08-12 MED ORDER — FUROSEMIDE 20 MG PO TABS: 20.0000 mg | ORAL_TABLET | Freq: Every day | ORAL | Status: DC

## 2012-08-12 MED ORDER — LISINOPRIL 40 MG PO TABS: 40.0000 mg | ORAL_TABLET | Freq: Every day | ORAL | Status: DC

## 2012-08-12 MED ORDER — ACYCLOVIR 400 MG PO TABS: 400.0000 mg | ORAL_TABLET | Freq: Every day | ORAL | Status: DC

## 2012-08-12 NOTE — Telephone Encounter (Signed)
lisinopril (PRINIVIL,ZESTRIL) 40 MG tablet  # 90  furosemide (LASIX) 20 MG tablet  #90  acyclovir (ZOVIRAX) 400 MG tablet  #90

## 2012-08-12 NOTE — Telephone Encounter (Signed)
meds filled

## 2012-08-17 ENCOUNTER — Other Ambulatory Visit: Payer: Self-pay | Admitting: General Practice

## 2012-08-17 DIAGNOSIS — B009 Herpesviral infection, unspecified: Secondary | ICD-10-CM

## 2012-08-17 MED ORDER — ACYCLOVIR 400 MG PO TABS: 400.0000 mg | ORAL_TABLET | Freq: Every day | ORAL | Status: DC

## 2012-08-19 ENCOUNTER — Telehealth: Payer: Self-pay | Admitting: Internal Medicine

## 2012-08-19 NOTE — Telephone Encounter (Signed)
Ryan calling, needs verbal authorization/clarification for refill for Lasix 20 mg.  Instructions state "take 1 daily. Take 1/2 tablet daily".   [phone] or leave msg on [phone]

## 2012-08-19 NOTE — Telephone Encounter (Signed)
Form fille d out already 1/2 tablet daily

## 2012-08-19 NOTE — Telephone Encounter (Signed)
Please verify the dosage.

## 2012-09-20 ENCOUNTER — Ambulatory Visit (INDEPENDENT_AMBULATORY_CARE_PROVIDER_SITE_OTHER): Payer: Medicare Other | Admitting: Internal Medicine

## 2012-09-20 ENCOUNTER — Encounter: Payer: Self-pay | Admitting: Internal Medicine

## 2012-09-20 VITALS — BP 130/70 | HR 58 | Temp 97.9°F | Resp 16 | Wt 147.5 lb

## 2012-09-20 DIAGNOSIS — M810 Age-related osteoporosis without current pathological fracture: Secondary | ICD-10-CM

## 2012-09-20 DIAGNOSIS — E785 Hyperlipidemia, unspecified: Secondary | ICD-10-CM

## 2012-09-20 LAB — COMPREHENSIVE METABOLIC PANEL
AST: 15 U/L (ref 0–37)
Albumin: 3.6 g/dL (ref 3.5–5.2)
BUN: 35 mg/dL — ABNORMAL HIGH (ref 6–23)
CO2: 22 mEq/L (ref 19–32)
Calcium: 9.9 mg/dL (ref 8.4–10.5)
Chloride: 110 mEq/L (ref 96–112)
GFR: 38.3 mL/min — ABNORMAL LOW (ref >60.00)
Potassium: 5.7 mEq/L — ABNORMAL HIGH (ref 3.5–5.1)

## 2012-09-20 LAB — CBC WITH DIFFERENTIAL/PLATELET
Eosinophils Absolute: 0.8 10*3/uL — ABNORMAL HIGH (ref 0.0–0.7)
Eosinophils Relative: 8.9 % — ABNORMAL HIGH (ref 0.0–5.0)
MCHC: 33.3 g/dL (ref 30.0–36.0)
MCV: 91.4 fl (ref 78.0–100.0)
Monocytes Absolute: 0.6 10*3/uL (ref 0.1–1.0)
Neutrophils Relative %: 60.5 % (ref 43.0–77.0)
Platelets: 206 10*3/uL (ref 150.0–400.0)
WBC: 8.9 10*3/uL (ref 4.5–10.5)

## 2012-09-20 LAB — MAGNESIUM: Magnesium: 1.6 mg/dL (ref 1.5–2.5)

## 2012-09-20 MED ORDER — ESTRADIOL 10 MCG VA TABS: 1.0000 | ORAL_TABLET | VAGINAL | Status: DC

## 2012-09-20 MED ORDER — ALPRAZOLAM 0.5 MG PO TABS: 0.5000 mg | ORAL_TABLET | Freq: Every evening | ORAL | Status: DC | PRN

## 2012-09-20 MED ORDER — PRAVASTATIN SODIUM 80 MG PO TABS: 80.0000 mg | ORAL_TABLET | Freq: Every day | ORAL | Status: DC

## 2012-09-20 NOTE — Assessment & Plan Note (Addendum)
Currently asymptomatic .  Has appt with [name] for preoperative clearance.   Continue asa, statin and ARB

## 2012-09-20 NOTE — Patient Instructions (Addendum)
You need to walk every day to keep your hip joint in good condition and keep your muscles strong.   Take the 3 ibuprofen and a tylenol one hour before you exercise.   Pleas return the stool samples as directed.  Do not use immodium until we have contacted you with the results

## 2012-09-20 NOTE — Progress Notes (Signed)
Patient ID: Audrey Duran, female   DOB: 1934/11/25, 78 y.o.   MRN: [id number]  Patient Active Problem List  Diagnosis  . Coronary artery disease  . Myocardial infarction  . Hyperlipidemia  . Screening for breast cancer  . Screening for cervical cancer  . Screening for colon cancer  . 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 Vaginitis  . Atrial fibrillation  . Anemia  . Recurrent HSV (herpes simplex virus)  . COPD (chronic obstructive pulmonary disease)  . Ischemic cardiomyopathy  . Routine general medical examination at a health care facility  . Osteoporosis screening  . At high risk for falls  . Anxiety state, unspecified    Subjective:  CC:   Chief Complaint  Patient presents with  . Follow-up    HPI:   Audrey Duran a 78 y.o. female who presents 1) Hip pain. Since her fall resultlng in right hip replacement 6 months ago , she has not recovered fully.   With regard to walking  Still has a limp,  Trying to exercise but limited by pain in the right inguinal area which has been worse since the weather went bad for the past week..  She is taking  3 ibuprofren once or twice daily and 2 tylenol daily .  Has not been back to see the orthopedi t  2) emotional distress secondary to conflict with daughter.  She is divorced from dtrs father.  She has given her several family heirlooms that her daughter has sold rather than keep and she is very upset about it .  She has been having difficulty initiating sleep, and has tried benadryl without relief.  She is requesting  alprazolam.   3) Going to have a bladder surgery and hysterectomy by De[facility]co. .  Needs cardiology clearance,  Has appt with [name].  Has frequent UTIs .  Using  vagifem tablet started last week .  \\  4) CAD:  Asymptomatic, No recent chest pain.   5) Seen at walk in clinic for diarrhea including nocturnal episodes several weeks ago .  Still having loose stools , like water thus far today she  has had 2 occurring dailly  Since last week   Past Medical History  Diagnosis Date  . Coronary artery disease 1995    s/p AMI , no history of stents,  [name]  . Myocardial infarction   . Atrial fibrillation   . Anemia   . Hyperlipidemia   . Recurrent HSV (herpes simplex virus)   . COPD (chronic obstructive pulmonary disease)   . Ischemic cardiomyopathy Dec 2011    ETT Sestamibi study apical scar, no ischemia, [name]    Past Surgical History  Procedure Laterality Date  . Cataract extraction  2004, 2006  . Carotid endarterectomy  July 2007    Dew, right carotid  . Joint replacement  July 2013    total hip , Hyacinth Meeker       The following portions of the patient's history were reviewed and updated as appropriate: Allergies, current medications, and problem list.    Review of Systems:   Patient denies headache, fevers, malaise, unintentional weight loss, skin rash, eye pain, sinus congestion and sinus pain, sore throat, dysphagia,  hemoptysis , cough, dyspnea, wheezing, chest pain, palpitations, orthopnea, edema, abdominal pain, nausea, melena, diarrhea, constipation, flank pain, dysuria, hematuria, urinary  Frequency, nocturia, numbness, tingling, seizures,  Focal weakness, Loss of consciousness,  Tremor, insomnia, depression, anxiety, and suicidal ideation.     History  Social History  . Marital Status: Divorced    Spouse Name: N/A    Number of Children: N/A  . Years of Education: N/A   Occupational History  . Not on file.   Social History Main Topics  . Smoking status: Former Smoker    Quit date: 01/13/1992  . Smokeless tobacco: Never Used  . Alcohol Use: No  . Drug Use: No  . Sexually Active: Not on file   Other Topics Concern  . Not on file   Social History Narrative  . No narrative on file    Objective:  BP 130/70  Pulse 58  Temp(Src) 97.9 F (36.6 C) (Oral)  Resp 16  Wt 147 lb 8 oz (66.906 kg)  BMI 25.31 kg/m2  SpO2 96%  General  appearance: alert, cooperative and appears stated age Ears: normal TM's and external ear canals both ears Throat: lips, mucosa, and tongue normal; teeth and gums normal Neck: no adenopathy, no carotid bruit, supple, symmetrical, trachea midline and thyroid not enlarged, symmetric, no tenderness/mass/nodules Back: symmetric, no curvature. ROM normal. No CVA tenderness. Lungs: clear to auscultation bilaterally Heart: regular rate and rhythm, S1, S2 normal, no murmur, click, rub or gallop Abdomen: soft, non-tender; bowel sounds normal; no masses,  no organomegaly Pulses: 2+ and symmetric Skin: Skin color, texture, turgor normal. No rashes or lesions Lymph nodes: Cervical, supraclavicular, and axillary nodes normal.  Assessment and Plan:  Coronary artery disease Currently asymptomatic .  Has appt with [name] for preoperative clearance.   Continue asa, statin and ARB  Hyperlipidemia Currently on pravastatin. But according to new guidelines she needs atorvastatin or rosuvastatin but has a history of intolerance to simvastatin,  Cystocele or rectocele with complete uterine prolapse She is concerned about having her surgery on her bladder which will require hysterectomy as well.  Risks and benefits discussed   Anxiety state, unspecified Secondary to conflict with daughter.  Prn alprazolam   A total of 40 minutes was spent with patient more than half of which was spent in counseling, reviewing records from other providers and coordination of care.  Updated Medication List Outpatient Encounter Prescriptions as of 09/20/2012  Medication Sig Dispense Refill  . acyclovir (ZOVIRAX) 400 MG tablet Take 1 tablet (400 mg total) by mouth daily.  90 tablet  3  . alendronate (FOSAMAX) 70 MG tablet Take 1 tablet (70 mg total) by mouth every 7 (seven) days. Take with a full glass of water on an empty stomach.  4 tablet  11  . aspirin 81 MG tablet Take 160 mg by mouth daily.        . carbonyl iron  (FEOSOL) 45 MG TABS Take one by mouth daily      . Cholecalciferol (VITAMIN D3) 3000 UNITS TABS Take 1 tablet by mouth daily. Take one by mouth daily  90 tablet  3  . cyanocobalamin 1000 MCG tablet Take one by mouth daily       . Estradiol (VAGIFEM) 10 MCG TABS Place 1 tablet (10 mcg total) vaginally every 3 (three) days.  30 tablet  3  . furosemide (LASIX) 20 MG tablet Take 1 tablet (20 mg total) by mouth daily. Take one half tablet by mouth daily  90 tablet  3  . isosorbide mononitrate (IMDUR) 60 MG 24 hr tablet Take 1 tablet (60 mg total) by mouth daily.  90 tablet  3  . lisinopril (PRINIVIL,ZESTRIL) 40 MG tablet Take 1 tablet (40 mg total) by mouth daily.  90 tablet  3  . magnesium oxide (MAG-OX) 400 MG tablet Take 400 mg by mouth daily.        . Omega-3 Fatty Acids (FISH OIL) 1200 MG CAPS Take one by mouth daily       . pravastatin (PRAVACHOL) 80 MG tablet Take 1 tablet (80 mg total) by mouth daily.  90 tablet  3  . TIMOLOL MALEATE PF OP Use one drop both eyes 2 times a day       . traMADol (ULTRAM) 50 MG tablet       . [DISCONTINUED] pravastatin (PRAVACHOL) 80 MG tablet Take 1 tablet (80 mg total) by mouth daily.  90 tablet  3  . ALPRAZolam (XANAX) 0.5 MG tablet Take 1 tablet (0.5 mg total) by mouth at bedtime as needed for sleep.  30 tablet  3  . Estradiol 10 MCG TABS Place 1 tablet (10 mcg total) vaginally every 3 (three) days.  10 tablet  3   No facility-administered encounter medications on file as of 09/20/2012.     Orders Placed This Encounter  Procedures  . Fecal fat, qualtitative  . Stool C-Diff Toxin Assay  . Stool Giardia/Cryptosporidium  . Stool Culture  . Stool, WBC/Lactoferrin  . Comprehensive metabolic panel  . Sedimentation rate  . CBC with Differential  . Magnesium  . Vitamin D 25 hydroxy    Return in about 4 weeks (around 10/18/2012).

## 2012-09-20 NOTE — Assessment & Plan Note (Signed)
Secondary to conflict with daughter.  Prn alprazolam

## 2012-09-20 NOTE — Assessment & Plan Note (Addendum)
Currently on pravastatin. But according to new guidelines she needs atorvastatin or rosuvastatin but has a history of intolerance to simvastatin,

## 2012-09-20 NOTE — Assessment & Plan Note (Signed)
She is concerned about having her surgery on her bladder which will require hysterectomy as well.  Risks and benefits discussed

## 2012-09-21 ENCOUNTER — Other Ambulatory Visit: Payer: Self-pay | Admitting: *Deleted

## 2012-09-21 ENCOUNTER — Other Ambulatory Visit (INDEPENDENT_AMBULATORY_CARE_PROVIDER_SITE_OTHER): Payer: Medicare Other

## 2012-09-21 ENCOUNTER — Emergency Department: Payer: Self-pay | Admitting: Emergency Medicine

## 2012-09-21 DIAGNOSIS — E875 Hyperkalemia: Secondary | ICD-10-CM | POA: Insufficient documentation

## 2012-09-21 LAB — COMPREHENSIVE METABOLIC PANEL
Albumin: 3.2 g/dL — ABNORMAL LOW (ref 3.4–5.0)
Alkaline Phosphatase: 63 U/L (ref 50–136)
BUN: 48 mg/dL — ABNORMAL HIGH (ref 7–18)
Bilirubin,Total: 0.2 mg/dL (ref 0.2–1.0)
Chloride: 112 mmol/L — ABNORMAL HIGH (ref 98–107)
Co2: 21 mmol/L (ref 21–32)
EGFR (Non-African Amer.): 30 — ABNORMAL LOW
Osmolality: 288 (ref 275–301)
SGPT (ALT): 17 U/L (ref 12–78)
Sodium: 138 mmol/L (ref 136–145)
Total Protein: 6.9 g/dL (ref 6.4–8.2)

## 2012-09-21 LAB — CBC
HCT: 34.9 % — ABNORMAL LOW (ref 35.0–47.0)
HGB: 11.4 g/dL — ABNORMAL LOW (ref 12.0–16.0)
MCHC: 32.7 g/dL (ref 32.0–36.0)
Platelet: 195 10*3/uL (ref 150–440)
RBC: 3.75 10*6/uL — ABNORMAL LOW (ref 3.80–5.20)
RDW: 13.2 % (ref 11.5–14.5)
WBC: 8.4 10*3/uL (ref 3.6–11.0)

## 2012-09-21 LAB — BASIC METABOLIC PANEL
Calcium: 9.5 mg/dL (ref 8.4–10.5)
Creatinine, Ser: 1.6 mg/dL — ABNORMAL HIGH (ref 0.4–1.2)

## 2012-09-21 LAB — VITAMIN D 25 HYDROXY (VIT D DEFICIENCY, FRACTURES): Vit D, 25-Hydroxy: 45 ng/mL (ref 30–89)

## 2012-09-21 NOTE — Addendum Note (Signed)
Addended by: Montine Circle D on: 09/21/2012 10:12 AM   Modules accepted: Orders

## 2012-09-21 NOTE — Addendum Note (Signed)
Addended by: Sherlene Shams on: 09/21/2012 07:07 AM   Modules accepted: Orders

## 2012-09-22 LAB — FECAL LACTOFERRIN, QUANT: Lactoferrin: POSITIVE

## 2012-09-22 LAB — GIARDIA/CRYPTOSPORIDIUM (EIA): Giardia Screen (EIA): NEGATIVE

## 2012-09-23 LAB — FECAL FAT, QUALITATIVE
Fat Qual Neutral, Stl: NORMAL
Fat Qual Total, Stl: NORMAL

## 2012-09-25 LAB — STOOL CULTURE

## 2012-09-26 ENCOUNTER — Telehealth: Payer: Self-pay | Admitting: *Deleted

## 2012-09-26 ENCOUNTER — Ambulatory Visit: Payer: Self-pay | Admitting: Internal Medicine

## 2012-09-26 NOTE — Telephone Encounter (Signed)
Patient lab appointment scheduled for 09-30-12

## 2012-09-26 NOTE — Telephone Encounter (Signed)
Patient walked into office asking about her labs this morning will call after mammogram

## 2012-09-29 ENCOUNTER — Ambulatory Visit (INDEPENDENT_AMBULATORY_CARE_PROVIDER_SITE_OTHER): Payer: Medicare Other | Admitting: *Deleted

## 2012-09-29 ENCOUNTER — Other Ambulatory Visit (INDEPENDENT_AMBULATORY_CARE_PROVIDER_SITE_OTHER): Payer: Medicare Other

## 2012-09-29 DIAGNOSIS — R5381 Other malaise: Secondary | ICD-10-CM

## 2012-09-29 LAB — BASIC METABOLIC PANEL
Calcium: 9.5 mg/dL (ref 8.4–10.5)
GFR: 46.97 mL/min — ABNORMAL LOW (ref >60.00)
Potassium: 5.4 mEq/L — ABNORMAL HIGH (ref 3.5–5.1)
Sodium: 135 mEq/L (ref 135–145)

## 2012-09-29 MED ORDER — MAGNESIUM SULFATE 50 % IJ SOLN
1.0000 g | Freq: Once | INTRAMUSCULAR | Status: AC
  Administered 2012-09-29 (×2): 1 g via INTRAMUSCULAR

## 2012-09-30 ENCOUNTER — Other Ambulatory Visit: Payer: Medicare Other

## 2012-09-30 MED ORDER — SODIUM POLYSTYRENE SULFONATE 15 GM/60ML PO SUSP: 15.0000 g | Freq: Once | ORAL | Status: DC

## 2012-09-30 NOTE — Addendum Note (Signed)
Addended by: Sherlene Shams on: 09/30/2012 12:35 AM   Modules accepted: Orders

## 2012-10-03 ENCOUNTER — Encounter: Payer: Self-pay | Admitting: Internal Medicine

## 2012-10-03 DIAGNOSIS — Z13820 Encounter for screening for osteoporosis: Secondary | ICD-10-CM

## 2012-10-07 ENCOUNTER — Telehealth: Payer: Self-pay | Admitting: Internal Medicine

## 2012-10-07 MED ORDER — SODIUM POLYSTYRENE SULFONATE 15 GM/60ML PO SUSP: 15.0000 g | Freq: Once | ORAL | Status: DC

## 2012-10-07 NOTE — Telephone Encounter (Signed)
Med filled on 3/14.

## 2012-10-07 NOTE — Telephone Encounter (Signed)
Please call into [facility] on [address].  [facility] is having a problem getting this medication to her. Patient wants to pick up sometime today  sodium polystyrene (KAYEXALATE) 15 GM/60ML suspension

## 2012-10-10 ENCOUNTER — Telehealth: Payer: Self-pay | Admitting: Internal Medicine

## 2012-10-10 NOTE — Telephone Encounter (Signed)
Caller: Audrey Duran/Patient; Phone: [phone]; Reason for Call: She was verifying she was just to take her kayexelate one time I verified in EMR that is what she was to do.  She also said she had a couple loose stools I told her that is expected with that medicine She also wanted to verify her upcoming appts which we did

## 2012-10-14 ENCOUNTER — Other Ambulatory Visit (INDEPENDENT_AMBULATORY_CARE_PROVIDER_SITE_OTHER): Payer: Medicare Other

## 2012-10-14 DIAGNOSIS — E875 Hyperkalemia: Secondary | ICD-10-CM

## 2012-10-14 LAB — BASIC METABOLIC PANEL
Chloride: 111 mEq/L (ref 96–112)
GFR: 50.89 mL/min — ABNORMAL LOW (ref >60.00)
Potassium: 4.9 mEq/L (ref 3.5–5.1)
Sodium: 137 mEq/L (ref 135–145)

## 2012-10-18 ENCOUNTER — Other Ambulatory Visit: Payer: Self-pay | Admitting: General Practice

## 2012-10-18 ENCOUNTER — Encounter: Payer: Self-pay | Admitting: Internal Medicine

## 2012-10-18 ENCOUNTER — Ambulatory Visit (INDEPENDENT_AMBULATORY_CARE_PROVIDER_SITE_OTHER): Payer: Medicare Other | Admitting: Internal Medicine

## 2012-10-18 VITALS — BP 128/72 | HR 56 | Temp 97.5°F | Resp 15 | Wt 146.0 lb

## 2012-10-18 DIAGNOSIS — E875 Hyperkalemia: Secondary | ICD-10-CM

## 2012-10-18 DIAGNOSIS — B009 Herpesviral infection, unspecified: Secondary | ICD-10-CM

## 2012-10-18 DIAGNOSIS — T679XXA Effect of heat and light, unspecified, initial encounter: Secondary | ICD-10-CM

## 2012-10-18 DIAGNOSIS — N183 Chronic kidney disease, stage 3 unspecified: Secondary | ICD-10-CM

## 2012-10-18 DIAGNOSIS — I2510 Atherosclerotic heart disease of native coronary artery without angina pectoris: Secondary | ICD-10-CM

## 2012-10-18 LAB — BASIC METABOLIC PANEL
CO2: 19 mEq/L (ref 19–32)
Calcium: 9.6 mg/dL (ref 8.4–10.5)
Chloride: 109 mEq/L (ref 96–112)
Potassium: 4.8 mEq/L (ref 3.5–5.1)
Sodium: 136 mEq/L (ref 135–145)

## 2012-10-18 LAB — TSH: TSH: 1.67 u[IU]/mL (ref 0.35–5.50)

## 2012-10-18 MED ORDER — FUROSEMIDE 20 MG PO TABS: 20.0000 mg | ORAL_TABLET | Freq: Every day | ORAL | Status: DC

## 2012-10-18 MED ORDER — AMLODIPINE BESYLATE 5 MG PO TABS: 5.0000 mg | ORAL_TABLET | Freq: Every day | ORAL | Status: DC

## 2012-10-18 MED ORDER — ACYCLOVIR 400 MG PO TABS: 400.0000 mg | ORAL_TABLET | Freq: Every day | ORAL | Status: DC

## 2012-10-18 NOTE — Patient Instructions (Signed)
Stop the lisinopril bc it may be causing your potassium elevations.   I will send amlodipine to your pharmacy to take instead for your blood pressure 5 mg daily   We are rechecking your potassium today  We will send your lab results to Dr Darrold Junker   Return in one week for bmet

## 2012-10-18 NOTE — Patient Instructions (Addendum)
Stop the lisinopril bc it may be causing your potassium elevations.   I will send amlodipine to your pharmacy to take instead for your blood pressure 5 mg daily   We are rechecking your potassium today

## 2012-10-18 NOTE — Progress Notes (Signed)
Patient ID: Audrey Duran, female   DOB: 04-03-1935, 78 y.o.   MRN: [id number]    Patient Active Problem List  Diagnosis  . Coronary artery disease  . Myocardial infarction  . Hyperlipidemia  . Screening for breast cancer  . Screening for cervical cancer  . Screening for colon cancer  . 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 Vaginitis  . Atrial fibrillation  . Anemia  . Recurrent HSV (herpes simplex virus)  . COPD (chronic obstructive pulmonary disease)  . Ischemic cardiomyopathy  . Routine general medical examination at a health care facility  . Osteoporosis screening  . At high risk for falls  . Anxiety state, unspecified  . Hypomagnesemia  . Eosinophilia  . Hyperkalemia  . CKD (chronic kidney disease) stage 3, GFR 30-59 ml/min    Subjective:  CC:   Chief Complaint  Patient presents with  . Follow-up    HPI:   Audrey Duran a 78 y.o. female who presents Followup on multiple conditions including diarrhea and recurrent hyperkalemia.Marland Kitchen  she has not take any Kayexalate in over 3 days because her pharmacy ran out of it. Her last dose was last Saturday. She is taking lisinopril daily would not tramadol. She's been using ibuprofen and Tylenol for pain and taking magnesium oxide for leg cramps.   2) Diarrhea has finally resolved without intervention . First stool of the day sis soft but not liquid.  She denies abdominal cramping nausea and hematochezia. She has had no weight loss.    Past Medical History  Diagnosis Date  . Coronary artery disease 1995    s/p AMI , no history of stents,  [name]  . Myocardial infarction   . Atrial fibrillation   . Anemia   . Hyperlipidemia   . Recurrent HSV (herpes simplex virus)   . COPD (chronic obstructive pulmonary disease)   . Ischemic cardiomyopathy Dec 2011    ETT Sestamibi study apical scar, no ischemia, [name]    Past Surgical History  Procedure Laterality Date  . Cataract extraction  2004,  2006  . Carotid endarterectomy  July 2007    Dew, right carotid  . Joint replacement  July 2013    total hip , Hyacinth Meeker       The following portions of the patient's history were reviewed and updated as appropriate: Allergies, current medications, and problem list.    Review of Systems:   Patient denies headache, fevers, malaise, unintentional weight loss, skin rash, eye pain, sinus congestion and sinus pain, sore throat, dysphagia,  hemoptysis , cough, dyspnea, wheezing, chest pain, palpitations, orthopnea, edema, abdominal pain, nausea, melena, diarrhea, constipation, flank pain, dysuria, hematuria, urinary  Frequency, nocturia, numbness, tingling, seizures,  Focal weakness, Loss of consciousness,  Tremor, insomnia, depression, anxiety, and suicidal ideation.     History   Social History  . Marital Status: Divorced    Spouse Name: N/A    Number of Children: N/A  . Years of Education: N/A   Occupational History  . Not on file.   Social History Main Topics  . Smoking status: Former Smoker    Quit date: 01/13/1992  . Smokeless tobacco: Never Used  . Alcohol Use: No  . Drug Use: No  . Sexually Active: Not on file   Other Topics Concern  . Not on file   Social History Narrative  . No narrative on file    Objective:  BP 128/72  Pulse 56  Temp(Src) 97.5 F (36.4 C) (Oral)  Resp  15  Wt 146 lb (66.225 kg)  BMI 25.05 kg/m2  SpO2 98%  General appearance: alert, cooperative and appears stated age Ears: normal TM's and external ear canals both ears Throat: lips, mucosa, and tongue normal; teeth and gums normal Neck: no adenopathy, no carotid bruit, supple, symmetrical, trachea midline and thyroid not enlarged, symmetric, no tenderness/mass/nodules Back: symmetric, no curvature. ROM normal. No CVA tenderness. Lungs: clear to auscultation bilaterally Heart: regular rate and rhythm, S1, S2 normal, no murmur, click, rub or gallop Abdomen: soft, non-tender; bowel sounds  normal; no masses,  no organomegaly Pulses: 2+ and symmetric Skin: Skin color, texture, turgor normal. No rashes or lesions Lymph nodes: Cervical, supraclavicular, and axillary nodes normal.  Assessment and Plan:  Coronary artery disease Followed by Dr. Beola Cord shows. She's on the appropriate medications. However lisinopril had to be stopped today due to recurrent hyperkalemia.  Hyperkalemia Recurrent, first on the treated in ER second time with home Kayexalate. She is taking lisinopril which may be the cause. This is been stopped today. Repeat potassium to today was normal. She should stay off of the lisinopril and start amlodipine for blood pressure. Her kidney function has bumped a little bit. So I do want her to have a  repeat metabolic panel in one week   CKD (chronic kidney disease) stage 3, GFR 30-59 ml/min Review of past creatinines show that she has had a decreased GFR for some time. Referral to nephrology for evaluation.   Updated Medication List Outpatient Encounter Prescriptions as of 10/18/2012  Medication Sig Dispense Refill  . acyclovir (ZOVIRAX) 400 MG tablet Take 1 tablet (400 mg total) by mouth daily.  90 tablet  3  . alendronate (FOSAMAX) 70 MG tablet Take 1 tablet (70 mg total) by mouth every 7 (seven) days. Take with a full glass of water on an empty stomach.  4 tablet  11  . ALPRAZolam (XANAX) 0.5 MG tablet Take 1 tablet (0.5 mg total) by mouth at bedtime as needed for sleep.  30 tablet  3  . aspirin 81 MG tablet Take 160 mg by mouth daily.        . carbonyl iron (FEOSOL) 45 MG TABS Take one by mouth daily      . Cholecalciferol (VITAMIN D3) 3000 UNITS TABS Take 1 tablet by mouth daily. Take one by mouth daily  90 tablet  3  . cyanocobalamin 1000 MCG tablet Take one by mouth daily       . Estradiol (VAGIFEM) 10 MCG TABS Place 1 tablet (10 mcg total) vaginally every 3 (three) days.  30 tablet  3  . Estradiol 10 MCG TABS Place 1 tablet (10 mcg total) vaginally every 3  (three) days.  10 tablet  3  . isosorbide mononitrate (IMDUR) 60 MG 24 hr tablet Take 1 tablet (60 mg total) by mouth daily.  90 tablet  3  . magnesium oxide (MAG-OX) 400 MG tablet Take 400 mg by mouth daily.        . Omega-3 Fatty Acids (FISH OIL) 1200 MG CAPS Take one by mouth daily       . pravastatin (PRAVACHOL) 80 MG tablet Take 1 tablet (80 mg total) by mouth daily.  90 tablet  3  . sodium polystyrene (KAYEXALATE) 15 GM/60ML suspension Take 60 mLs (15 g total) by mouth once.  500 mL  0  . TIMOLOL MALEATE PF OP Use one drop both eyes 2 times a day       . [DISCONTINUED]  acyclovir (ZOVIRAX) 400 MG tablet Take 1 tablet (400 mg total) by mouth daily.  90 tablet  3  . [DISCONTINUED] furosemide (LASIX) 20 MG tablet Take 1 tablet (20 mg total) by mouth daily. Take one half tablet by mouth daily  90 tablet  3  . [DISCONTINUED] furosemide (LASIX) 20 MG tablet Take 1 tablet (20 mg total) by mouth daily. Take one half tablet by mouth daily  90 tablet  3  . [DISCONTINUED] furosemide (LASIX) 20 MG tablet Take 1 tablet (20 mg total) by mouth daily. Take one half tablet by mouth daily  90 tablet  3  . [DISCONTINUED] lisinopril (PRINIVIL,ZESTRIL) 40 MG tablet Take 1 tablet (40 mg total) by mouth daily.  90 tablet  3  . [DISCONTINUED] traMADol (ULTRAM) 50 MG tablet       . amLODipine (NORVASC) 5 MG tablet Take 1 tablet (5 mg total) by mouth daily.  30 tablet  0   No facility-administered encounter medications on file as of 10/18/2012.     Orders Placed This Encounter  Procedures  . Basic metabolic panel  . TSH  . Basic metabolic panel  . Ambulatory referral to Nephrology    No Follow-up on file.

## 2012-10-19 ENCOUNTER — Encounter: Payer: Self-pay | Admitting: Internal Medicine

## 2012-10-19 DIAGNOSIS — N1831 Chronic kidney disease, stage 3a: Secondary | ICD-10-CM | POA: Insufficient documentation

## 2012-10-19 NOTE — Assessment & Plan Note (Signed)
Followed by Dr. Beola Cord shows. She's on the appropriate medications. However lisinopril had to be stopped today due to recurrent hyperkalemia.

## 2012-10-19 NOTE — Assessment & Plan Note (Signed)
Review of past creatinines show that she has had a decreased GFR for some time. Referral to nephrology for evaluation.

## 2012-10-19 NOTE — Assessment & Plan Note (Addendum)
Recurrent, first on the treated in ER second time with home Kayexalate. She is taking lisinopril which may be the cause. This is been stopped today. Repeat potassium to today was normal. She should stay off of the lisinopril and start amlodipine for blood pressure. Her kidney function has bumped a little bit. So I do want her to have a  repeat metabolic panel in one week

## 2012-10-21 ENCOUNTER — Encounter: Payer: Self-pay | Admitting: Internal Medicine

## 2012-11-01 ENCOUNTER — Other Ambulatory Visit (INDEPENDENT_AMBULATORY_CARE_PROVIDER_SITE_OTHER): Payer: Medicare Other

## 2012-11-01 ENCOUNTER — Other Ambulatory Visit: Payer: Self-pay | Admitting: General Practice

## 2012-11-01 LAB — BASIC METABOLIC PANEL
Calcium: 9.5 mg/dL (ref 8.4–10.5)
Chloride: 110 mEq/L (ref 96–112)
Creatinine, Ser: 1 mg/dL (ref 0.4–1.2)
GFR: 54.85 mL/min — ABNORMAL LOW (ref >60.00)
Sodium: 138 mEq/L (ref 135–145)

## 2012-11-01 MED ORDER — FUROSEMIDE 20 MG PO TABS: 10.0000 mg | ORAL_TABLET | Freq: Every day | ORAL | Status: DC

## 2012-11-02 ENCOUNTER — Other Ambulatory Visit: Payer: Self-pay | Admitting: *Deleted

## 2012-11-02 DIAGNOSIS — B009 Herpesviral infection, unspecified: Secondary | ICD-10-CM

## 2012-11-02 MED ORDER — ACYCLOVIR 400 MG PO TABS: 400.0000 mg | ORAL_TABLET | Freq: Every day | ORAL | Status: DC

## 2012-11-02 MED ORDER — ISOSORBIDE MONONITRATE ER 60 MG PO TB24: 60.0000 mg | ORAL_TABLET | Freq: Every day | ORAL | Status: DC

## 2012-11-02 NOTE — Telephone Encounter (Signed)
Refill request  Estrace Vaginal cream 0.1 mg  Place 2 grams vaginally 2 times a week

## 2012-11-08 ENCOUNTER — Other Ambulatory Visit: Payer: Self-pay | Admitting: *Deleted

## 2012-11-08 DIAGNOSIS — B009 Herpesviral infection, unspecified: Secondary | ICD-10-CM

## 2012-11-08 NOTE — Telephone Encounter (Signed)
Refill Request  Estrace Vaginal cream 0.1 MG / GM  Place 2 Grams vaginally 2 times a week

## 2012-11-09 MED ORDER — ACYCLOVIR 400 MG PO TABS: 400.0000 mg | ORAL_TABLET | Freq: Every day | ORAL | Status: DC

## 2012-11-09 MED ORDER — ESTRADIOL 0.1 MG/GM VA CREA: 1.0000 g | TOPICAL_CREAM | VAGINAL | Status: DC

## 2012-11-09 NOTE — Telephone Encounter (Signed)
Vaginal cream refill sent to mail order pharmacy

## 2012-11-09 NOTE — Addendum Note (Signed)
Addended by: Sherlene Shams on: 11/09/2012 03:32 PM   Modules accepted: Orders, Medications

## 2012-11-14 ENCOUNTER — Encounter: Payer: Self-pay | Admitting: *Deleted

## 2012-11-17 ENCOUNTER — Telehealth: Payer: Self-pay | Admitting: Internal Medicine

## 2012-11-17 ENCOUNTER — Other Ambulatory Visit: Payer: Self-pay | Admitting: Internal Medicine

## 2012-11-17 DIAGNOSIS — B009 Herpesviral infection, unspecified: Secondary | ICD-10-CM

## 2012-11-17 MED ORDER — ISOSORBIDE MONONITRATE ER 60 MG PO TB24: 60.0000 mg | ORAL_TABLET | Freq: Every day | ORAL | Status: DC

## 2012-11-17 MED ORDER — ACYCLOVIR 400 MG PO TABS: 400.0000 mg | ORAL_TABLET | Freq: Every day | ORAL | Status: DC

## 2012-11-17 MED ORDER — AMLODIPINE BESYLATE 5 MG PO TABS: 5.0000 mg | ORAL_TABLET | Freq: Every day | ORAL | Status: DC

## 2012-11-17 NOTE — Telephone Encounter (Signed)
Patient would like refill amlodipine sent to [facility] for thirty days and to mail order for other refills.

## 2012-11-17 NOTE — Telephone Encounter (Signed)
Medication sent to pharmacy electronically tried to notify patient left voice mail to call office.

## 2012-11-17 NOTE — Telephone Encounter (Signed)
[facility] [address]  °Pt needs her bp meds Didn't know the name  °Pt stated she is completely out of her meds  °Please advise pt when this is called in  ° ° °

## 2012-11-17 NOTE — Telephone Encounter (Signed)
Patient notified script sent to local pharmacy.

## 2012-11-17 NOTE — Telephone Encounter (Signed)
[facility] [address]  Pt needs her bp meds Didn't know the name  Pt stated she is completely out of her meds  Please advise pt when this is called in

## 2012-11-18 ENCOUNTER — Telehealth: Payer: Self-pay | Admitting: Internal Medicine

## 2012-11-18 DIAGNOSIS — R9439 Abnormal result of other cardiovascular function study: Secondary | ICD-10-CM

## 2012-11-18 MED ORDER — ACYCLOVIR 400 MG PO TABS: 400.0000 mg | ORAL_TABLET | Freq: Every day | ORAL | Status: DC

## 2012-11-18 MED ORDER — ISOSORBIDE MONONITRATE ER 60 MG PO TB24: 60.0000 mg | ORAL_TABLET | Freq: Every day | ORAL | Status: DC

## 2012-11-18 NOTE — Telephone Encounter (Signed)
Notified pt of results. She had not been contacted by Dr. Briscoe Burns office, but has a follow up appointment scheduled with him on 11/21/12.

## 2012-11-18 NOTE — Telephone Encounter (Signed)
Has Dr. Darrold Junker contacted her about her abnormal stress test? It showed a small reversible defect. Please have her contact his office for close followup and

## 2012-11-21 ENCOUNTER — Telehealth: Payer: Self-pay | Admitting: *Deleted

## 2012-11-21 NOTE — Telephone Encounter (Signed)
Patient notified per DPR left detailed message on voicemail.

## 2012-11-21 NOTE — Telephone Encounter (Signed)
Yes, continue the amlodipine ,  rrefill if needed

## 2012-11-21 NOTE — Telephone Encounter (Signed)
Medication concern is Amlodipine the medication you would like her to continue?

## 2012-11-21 NOTE — Telephone Encounter (Signed)
No, I did not request a right heart cath.  It may have been Dr. Cherylann Ratel, her nephrologist.

## 2012-11-21 NOTE — Telephone Encounter (Signed)
Marie from cardiologist( [name]) office called stating that patient was scheduled today for an evaluation for a catherization due to pulmonary hypertension. Hilda Lias wants to know if you had requested an evaluation for a right heart cath?  The nurse saw a note tacked on to a stress test they were evaluating and is not sure whom requested a right heart cath.

## 2012-11-21 NOTE — Telephone Encounter (Signed)
Marie notified as requested.

## 2012-12-07 ENCOUNTER — Other Ambulatory Visit: Payer: Self-pay | Admitting: *Deleted

## 2012-12-07 MED ORDER — AMLODIPINE BESYLATE 5 MG PO TABS: 5.0000 mg | ORAL_TABLET | Freq: Every day | ORAL | Status: DC

## 2012-12-25 HISTORY — PX: ABDOMINAL HYSTERECTOMY: SHX81

## 2013-01-26 ENCOUNTER — Ambulatory Visit: Payer: Self-pay | Admitting: Obstetrics and Gynecology

## 2013-01-26 LAB — BASIC METABOLIC PANEL
BUN: 15 mg/dL (ref 7–18)
Calcium, Total: 10 mg/dL (ref 8.5–10.1)
Chloride: 106 mmol/L (ref 98–107)
Co2: 29 mmol/L (ref 21–32)
Creatinine: 1.07 mg/dL (ref 0.60–1.30)
EGFR (African American): 59 — ABNORMAL LOW
Glucose: 101 mg/dL — ABNORMAL HIGH (ref 65–99)
Sodium: 141 mmol/L (ref 136–145)

## 2013-01-26 LAB — CBC
HCT: 38.2 % (ref 35.0–47.0)
HGB: 12.7 g/dL (ref 12.0–16.0)
MCH: 30.1 pg (ref 26.0–34.0)
MCV: 90 fL (ref 80–100)
RBC: 4.24 10*6/uL (ref 3.80–5.20)
RDW: 12.9 % (ref 11.5–14.5)
WBC: 8.2 10*3/uL (ref 3.6–11.0)

## 2013-01-27 LAB — HM PAP SMEAR

## 2013-01-30 ENCOUNTER — Ambulatory Visit: Payer: Self-pay | Admitting: Obstetrics and Gynecology

## 2013-01-31 LAB — HEMOGLOBIN: HGB: 10.9 g/dL — ABNORMAL LOW (ref 12.0–16.0)

## 2013-02-01 LAB — CBC WITH DIFFERENTIAL/PLATELET
Basophil #: 0.1 10*3/uL (ref 0.0–0.1)
Eosinophil #: 0 10*3/uL (ref 0.0–0.7)
Eosinophil %: 0.2 %
HCT: 32.4 % — ABNORMAL LOW (ref 35.0–47.0)
Lymphocyte #: 0.9 10*3/uL — ABNORMAL LOW (ref 1.0–3.6)
Lymphocyte %: 6.5 %
MCH: 29.6 pg (ref 26.0–34.0)
MCHC: 33.3 g/dL (ref 32.0–36.0)
Monocyte #: 0.8 x10 3/mm (ref 0.2–0.9)
Monocyte %: 5.4 %
Neutrophil #: 12.4 10*3/uL — ABNORMAL HIGH (ref 1.4–6.5)
Neutrophil %: 87.5 %
Platelet: 164 10*3/uL (ref 150–440)
RDW: 12.9 % (ref 11.5–14.5)
WBC: 14.1 10*3/uL — ABNORMAL HIGH (ref 3.6–11.0)

## 2013-02-01 LAB — BASIC METABOLIC PANEL
Anion Gap: 6 — ABNORMAL LOW (ref 7–16)
BUN: 7 mg/dL (ref 7–18)
Calcium, Total: 9.3 mg/dL (ref 8.5–10.1)
Chloride: 103 mmol/L (ref 98–107)
Creatinine: 0.93 mg/dL (ref 0.60–1.30)
EGFR (African American): >60
Osmolality: 275 (ref 275–301)
Sodium: 137 mmol/L (ref 136–145)

## 2013-05-30 LAB — HM MAMMOGRAPHY: HM MAMMO: NORMAL

## 2013-06-14 ENCOUNTER — Ambulatory Visit: Payer: Self-pay | Admitting: Internal Medicine

## 2013-07-03 ENCOUNTER — Encounter: Payer: Self-pay | Admitting: Internal Medicine

## 2013-07-28 ENCOUNTER — Telehealth: Payer: Self-pay | Admitting: Internal Medicine

## 2013-07-28 NOTE — Telephone Encounter (Signed)
Pt states with her insurance changing to [facility] her prescriptions will have to be sent to mail order.  Pt thinks that they will go to [facility], but is not positive.  States she needs her prescriptions sent over now.  Copy of card scanned.

## 2013-07-28 NOTE — Telephone Encounter (Signed)
She is on several medications that require 6 monthoffice visit or labs surveillance and the last labs I have on her were from April.  Dr Rolly Salter sent them over, so she cannot have refills on pravastatin, furosemide, alprazolam and alendronate

## 2013-07-28 NOTE — Telephone Encounter (Signed)
Pt brought in new insurance card.  Scanned.  Pt states she is to have referrals and was told to bring in the new card.

## 2013-07-28 NOTE — Telephone Encounter (Signed)
Pt called she has an appointment with dr Ted Mcalpine office 08/10/13 she has [employer] and needs a referral i ask to bring card so we can do referral

## 2013-07-28 NOTE — Telephone Encounter (Signed)
Pt last seen in March. Okay to refill meds?

## 2013-07-31 NOTE — Telephone Encounter (Signed)
The patient will see Dr. Saralyn Pilar the  [facility] Dr. 1.15.15. Will need a referral

## 2013-07-31 NOTE — Telephone Encounter (Signed)
Referral underway. 

## 2013-07-31 NOTE — Telephone Encounter (Signed)
Referral for [facility] is under way

## 2013-08-07 ENCOUNTER — Telehealth: Payer: Self-pay | Admitting: Emergency Medicine

## 2013-08-07 NOTE — Telephone Encounter (Signed)
Pt has been approved to see [facility] with 4 visits, exp 10/31/13. Auth # [id number]

## 2013-08-10 ENCOUNTER — Other Ambulatory Visit: Payer: Self-pay | Admitting: *Deleted

## 2013-08-10 DIAGNOSIS — B009 Herpesviral infection, unspecified: Secondary | ICD-10-CM

## 2013-08-10 DIAGNOSIS — E785 Hyperlipidemia, unspecified: Secondary | ICD-10-CM

## 2013-08-11 MED ORDER — PRAVASTATIN SODIUM 80 MG PO TABS: 80.0000 mg | ORAL_TABLET | Freq: Every day | ORAL | Status: DC

## 2013-08-11 MED ORDER — ISOSORBIDE MONONITRATE ER 60 MG PO TB24: 60.0000 mg | ORAL_TABLET | Freq: Every day | ORAL | Status: DC

## 2013-08-11 MED ORDER — FUROSEMIDE 20 MG PO TABS: 10.0000 mg | ORAL_TABLET | Freq: Every day | ORAL | Status: DC

## 2013-08-11 MED ORDER — ACYCLOVIR 400 MG PO TABS: 400.0000 mg | ORAL_TABLET | Freq: Every day | ORAL | Status: DC

## 2013-08-14 ENCOUNTER — Telehealth: Payer: Self-pay | Admitting: Internal Medicine

## 2013-08-14 DIAGNOSIS — B009 Herpesviral infection, unspecified: Secondary | ICD-10-CM

## 2013-08-14 DIAGNOSIS — E785 Hyperlipidemia, unspecified: Secondary | ICD-10-CM

## 2013-08-14 NOTE — Telephone Encounter (Signed)
States her scripts were sent to [facility], should have been [facility].  Says that [facility] told her to tell us we can call them to get the prescriptions set up to send to the pt.  Ph:  [phone].  Fx:  [phone]

## 2013-08-15 MED ORDER — ACYCLOVIR 400 MG PO TABS: 400.0000 mg | ORAL_TABLET | Freq: Every day | ORAL | Status: DC

## 2013-08-15 MED ORDER — ISOSORBIDE MONONITRATE ER 60 MG PO TB24: 60.0000 mg | ORAL_TABLET | Freq: Every day | ORAL | Status: DC

## 2013-08-15 MED ORDER — PRAVASTATIN SODIUM 80 MG PO TABS: 80.0000 mg | ORAL_TABLET | Freq: Every day | ORAL | Status: DC

## 2013-08-15 MED ORDER — FUROSEMIDE 20 MG PO TABS: 10.0000 mg | ORAL_TABLET | Freq: Every day | ORAL | Status: DC

## 2013-08-15 NOTE — Telephone Encounter (Signed)
Refill sent electronically and patient notified.

## 2013-08-16 ENCOUNTER — Telehealth: Payer: Self-pay | Admitting: *Deleted

## 2013-08-16 NOTE — Telephone Encounter (Signed)
Pharmacy Note:  New Rx for pravastatin, patient reports HMG-CO-A reductase inhibitor allergy and there is a potential for cross-sensitivity with pravastatin, please confirm if ok to fill this medication

## 2013-08-16 NOTE — Telephone Encounter (Signed)
Patient has been tolerating pravastatin,according to notes,  She has been on it  For a while.  .. Clarify patient's concerns

## 2013-08-17 NOTE — Telephone Encounter (Signed)
Left message for patient to return call to office. 

## 2013-08-18 NOTE — Telephone Encounter (Signed)
Pharmacy and patient notified.

## 2013-10-09 ENCOUNTER — Telehealth: Payer: Self-pay | Admitting: Internal Medicine

## 2013-10-09 NOTE — Telephone Encounter (Signed)
Patient called to let us know that she has 2 upcoming appointments one is with Dr. Aretta Nip on 3.30.15 and one with Dr. Wallace Going at [facility] on 11/09/13. I have filled out and faxed the [employer] referral sheet for both providers.

## 2013-10-11 NOTE — Telephone Encounter (Signed)
Per [employer] no referral is needed for [facility] as they have [facility]

## 2013-10-12 ENCOUNTER — Telehealth: Payer: Self-pay | Admitting: Internal Medicine

## 2013-10-12 DIAGNOSIS — E559 Vitamin D deficiency, unspecified: Secondary | ICD-10-CM

## 2013-10-12 DIAGNOSIS — E875 Hyperkalemia: Secondary | ICD-10-CM

## 2013-10-12 DIAGNOSIS — I2510 Atherosclerotic heart disease of native coronary artery without angina pectoris: Secondary | ICD-10-CM

## 2013-10-12 DIAGNOSIS — E785 Hyperlipidemia, unspecified: Secondary | ICD-10-CM

## 2013-10-12 NOTE — Telephone Encounter (Signed)
The patient called wanting a lab appointment ,but I don't have any lab orders. Please advise.

## 2013-10-12 NOTE — Telephone Encounter (Signed)
Fasting labs ordered

## 2013-10-12 NOTE — Telephone Encounter (Signed)
Please advise if patient needs labs patient has appointment with you 10/26/13.

## 2013-10-12 NOTE — Telephone Encounter (Signed)
Left message on patient's home phone to call back to schedule fasting labs.

## 2013-10-13 ENCOUNTER — Other Ambulatory Visit: Payer: Self-pay | Admitting: Internal Medicine

## 2013-10-13 NOTE — Telephone Encounter (Signed)
Per [employer] [facility] pt does not need referral for Dr. Enzo Bi.

## 2013-10-18 ENCOUNTER — Other Ambulatory Visit (INDEPENDENT_AMBULATORY_CARE_PROVIDER_SITE_OTHER): Payer: Medicare HMO

## 2013-10-18 DIAGNOSIS — E785 Hyperlipidemia, unspecified: Secondary | ICD-10-CM

## 2013-10-18 DIAGNOSIS — E875 Hyperkalemia: Secondary | ICD-10-CM

## 2013-10-18 DIAGNOSIS — E559 Vitamin D deficiency, unspecified: Secondary | ICD-10-CM

## 2013-10-18 LAB — COMPREHENSIVE METABOLIC PANEL
ALT: 16 U/L (ref 0–35)
AST: 15 U/L (ref 0–37)
Albumin: 3.6 g/dL (ref 3.5–5.2)
Alkaline Phosphatase: 66 U/L (ref 39–117)
BUN: 16 mg/dL (ref 6–23)
CALCIUM: 10 mg/dL (ref 8.4–10.5)
CHLORIDE: 105 meq/L (ref 96–112)
CO2: 28 meq/L (ref 19–32)
Creatinine, Ser: 1.1 mg/dL (ref 0.4–1.2)
GFR: 54.11 mL/min — ABNORMAL LOW (ref >60.00)
Glucose, Bld: 114 mg/dL — ABNORMAL HIGH (ref 70–99)
POTASSIUM: 3.9 meq/L (ref 3.5–5.1)
Sodium: 138 mEq/L (ref 135–145)
Total Bilirubin: 0.4 mg/dL (ref 0.3–1.2)
Total Protein: 6.9 g/dL (ref 6.0–8.3)

## 2013-10-18 LAB — TSH: TSH: 2.87 u[IU]/mL (ref 0.35–5.50)

## 2013-10-18 LAB — LIPID PANEL
CHOL/HDL RATIO: 3
Cholesterol: 156 mg/dL (ref 0–200)
HDL: 52.4 mg/dL (ref >39.00)
LDL Cholesterol: 89 mg/dL (ref 0–99)
Triglycerides: 74 mg/dL (ref 0.0–149.0)
VLDL: 14.8 mg/dL (ref 0.0–40.0)

## 2013-10-18 LAB — MAGNESIUM: Magnesium: 2 mg/dL (ref 1.5–2.5)

## 2013-10-19 ENCOUNTER — Encounter: Payer: Self-pay | Admitting: *Deleted

## 2013-10-19 LAB — VITAMIN D 25 HYDROXY (VIT D DEFICIENCY, FRACTURES): VIT D 25 HYDROXY: 53 ng/mL (ref 30–89)

## 2013-10-26 ENCOUNTER — Ambulatory Visit (INDEPENDENT_AMBULATORY_CARE_PROVIDER_SITE_OTHER): Payer: Medicare HMO | Admitting: Internal Medicine

## 2013-10-26 ENCOUNTER — Encounter: Payer: Self-pay | Admitting: Internal Medicine

## 2013-10-26 VITALS — BP 142/78 | HR 56 | Temp 97.8°F | Resp 16 | Ht 63.9 in | Wt 151.0 lb

## 2013-10-26 DIAGNOSIS — Z90722 Acquired absence of ovaries, bilateral: Secondary | ICD-10-CM

## 2013-10-26 DIAGNOSIS — I2589 Other forms of chronic ischemic heart disease: Secondary | ICD-10-CM

## 2013-10-26 DIAGNOSIS — E785 Hyperlipidemia, unspecified: Secondary | ICD-10-CM

## 2013-10-26 DIAGNOSIS — Z90710 Acquired absence of both cervix and uterus: Secondary | ICD-10-CM

## 2013-10-26 DIAGNOSIS — Z23 Encounter for immunization: Secondary | ICD-10-CM

## 2013-10-26 DIAGNOSIS — I255 Ischemic cardiomyopathy: Secondary | ICD-10-CM

## 2013-10-26 DIAGNOSIS — R300 Dysuria: Secondary | ICD-10-CM

## 2013-10-26 DIAGNOSIS — E875 Hyperkalemia: Secondary | ICD-10-CM

## 2013-10-26 DIAGNOSIS — Z9079 Acquired absence of other genital organ(s): Secondary | ICD-10-CM

## 2013-10-26 DIAGNOSIS — N183 Chronic kidney disease, stage 3 unspecified: Secondary | ICD-10-CM

## 2013-10-26 DIAGNOSIS — Z0000 Encounter for general adult medical examination without abnormal findings: Secondary | ICD-10-CM

## 2013-10-26 DIAGNOSIS — N813 Complete uterovaginal prolapse: Secondary | ICD-10-CM

## 2013-10-26 LAB — POCT URINALYSIS DIPSTICK
Bilirubin, UA: NEGATIVE
Glucose, UA: NEGATIVE
KETONES UA: NEGATIVE
Leukocytes, UA: NEGATIVE
Nitrite, UA: NEGATIVE
PH UA: 5.5
PROTEIN UA: NEGATIVE
RBC UA: NEGATIVE
Spec Grav, UA: 1.01
Urobilinogen, UA: 0.2

## 2013-10-26 MED ORDER — ALPRAZOLAM 0.5 MG PO TABS: 0.5000 mg | ORAL_TABLET | Freq: Every evening | ORAL | Status: DC | PRN

## 2013-10-26 NOTE — Telephone Encounter (Signed)
Patient called back I advised her of the appointment with Dr. Enzo Bi, I also explained how the referral process worked. That she would need a referral sent in and [facility] would approve so many visits within 3 months time. She did say that she has an appointment coming up with Dr. Lavonia Dana at [facility] has an appt on 01/01/2014 at 10:00. Please send in referral information to [facility] on this appointment and let the patient know once we have received information back from them.

## 2013-10-26 NOTE — Progress Notes (Signed)
Pre-visit discussion using our clinic review tool. No additional management support is needed unless otherwise documented below in the visit note.  

## 2013-10-26 NOTE — Telephone Encounter (Signed)
Patient came in today for a visit. She didn't see dr. Enzo Bi on 3.30.15 since we didn't call her to let her know what [employer] said about her referrral. I have made her another appointment for 11/02/13 at 9:15 if we have the authorization number from [employer] we need to fax it to [facility] so they can put in patients records. They don't have the confirmation number. I have also called patient to let her know about the new appointment.

## 2013-10-26 NOTE — Patient Instructions (Addendum)
You had your annual Medicare wellness exam today  We will schedule your mammogram soon.  You will be due for colonoscopy next year   You received the pneumonia vaccine today.  Your bloodwork was all excellent  Return in 6 months for nonfasting labs and office visit   Larene Beach, our office manager will confirm your referral status to Dr Enzo Bi and Dr Wallace Going   If your urine test is normal,  We will refill your premarin cream.

## 2013-10-26 NOTE — Progress Notes (Signed)
Patient ID: Audrey Duran, female   DOB: 11-12-1934, 79 y.o.   MRN: [id number]  The patient is here for annual Medicare wellness examination and management of other chronic and acute problems.  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  No litfing  But lots of bumps and bruises from bsket balls.    No recent illnesses    Cardiology followup: saw [name]  For 6 month follow up ; no deterioration in systolic function  S/p TAH/BSO  July 2014    6 month follow up appt with DeFrancesco was wasted bc our office did not  Call her to confirm that no referral was necessary , so when she got to the appt and was told we had not called,  She did not press the issue . worried about the same thing  happening with [place]ton appt.     The risk factors are reflected in the social history.  The roster of all physicians providing medical care to patient - is listed in the Snapshot section of the chart.  Activities of daily living:  The patient is 100% independent in all ADLs: dressing, toileting, feeding as well as independent mobility  Home safety : The patient has smoke detectors in the home. They wear seatbelts.  There are no firearms at home. There is no violence in the home.   There is no risks for hepatitis, STDs or HIV. There is no   history of blood transfusion. They have no travel history to infectious disease endemic areas of the world.  The patient has seen their dentist in the last six month. They have seen their eye doctor in the last year. They admit to slight hearing difficulty with regard to whispered voices and some television programs.  They have deferred audiologic testing in the last year.  They do not  have excessive sun exposure. Discussed the need for sun protection: hats, long sleeves and use of sunscreen if there is significant sun exposure.   Diet: the importance of a healthy diet is discussed. They do have a healthy diet.  The benefits of regular aerobic exercise were  discussed. She walks 4 times per week ,  20 minutes.   Depression screen: there are no signs or vegative symptoms of depression- irritability, change in appetite, anhedonia, sadness/tearfullness.  Cognitive assessment: the patient manages all their financial and personal affairs and is actively engaged. They could relate day,date,year and events; recalled 2/3 objects at 3 minutes; performed clock-face test normally.  The following portions of the patient's history were reviewed and updated as appropriate: allergies, current medications, past family history, past medical history,  past surgical history, past social history  and problem list.  Visual acuity was not assessed per patient preference since she has regular follow up with her ophthalmologist. Hearing and body mass index were assessed and reviewed.   During the course of the visit the patient was educated and counseled about appropriate screening and preventive services including : fall prevention , diabetes screening, nutrition counseling, colorectal cancer screening, and recommended immunizations.    Objective:  BP 142/78  Pulse 56  Temp(Src) 97.8 F (36.6 C) (Oral)  Resp 16  Ht 5' 3.9" (1.623 m)  Wt 151 lb (68.493 kg)  BMI 26.00 kg/m2  SpO2 98%  General appearance: alert, cooperative and appears stated age Head: Normocephalic, without obvious abnormality, atraumatic Eyes: conjunctivae/corneas clear. PERRL, EOM's intact. Fundi benign. Ears: normal TM's and external ear canals both ears Nose: Nares normal. Septum  midline. Mucosa normal. No drainage or sinus tenderness. Throat: lips, mucosa, and tongue normal; teeth and gums normal Neck: no adenopathy, no carotid bruit, no JVD, supple, symmetrical, trachea midline and thyroid not enlarged, symmetric, no tenderness/mass/nodules Lungs: clear to auscultation bilaterally Breasts: normal appearance, no masses or tenderness Heart: regular rate and rhythm, S1, S2 normal, no murmur,  click, rub or gallop Abdomen: soft, non-tender; bowel sounds normal; no masses,  no organomegaly Extremities: extremities normal, atraumatic, no cyanosis or edema Pulses: 2+ and symmetric Skin: Skin color, texture, turgor normal. No rashes or lesions Neurologic: Alert and oriented X 3, normal strength and tone. Normal symmetric reflexes. Normal coordination and gait.   Assessment and Plan:  CKD (chronic kidney disease) stage 3, GFR 30-59 ml/min Review of past creatinines show that she has had a decreased GFR for some time. Cr is stable .  Sees Nephrology bid .  Avoiding NSAID  Lab Results  Component Value Date   CREATININE 1.1 10/18/2013     Hyperlipidemia Currently managed with pravastatin. She has a history of intolerance to simvastatin.  Lab Results  Component Value Date   CHOL 156 10/18/2013   HDL 52.40 10/18/2013   LDLCALC 89 10/18/2013   TRIG 74.0 10/18/2013   CHOLHDL 3 10/18/2013       Hyperkalemia Recurrent, first time treated in ER second time with home Kayexalate. Secondary to lisinopril which  Has  been stopped .     Cystocele or rectocele with complete uterine prolapse S/p TVH/BSO with anterior colporrhaphy July 2014 for pelvic organ prolapse  Routine general medical examination at a health care facility Annual comprehensive exam was done including breast, excluding pelvic and PAP smear. All screenings have been addressed .   Ischemic cardiomyopathy Followed by Dr. Saralyn Pilar, Asymptomatic.  . She's on the appropriate medications. However lisinopril had to be stopped due to recurrent hyperkalemia.     Updated Medication List Outpatient Encounter Prescriptions as of 10/26/2013  Medication Sig  . acyclovir (ZOVIRAX) 400 MG tablet Take 1 tablet (400 mg total) by mouth daily.  Marland Kitchen alendronate (FOSAMAX) 70 MG tablet Take 1 tablet (70 mg total) by mouth every 7 (seven) days. Take with a full glass of water on an empty stomach.  . ALPRAZolam (XANAX) 0.5 MG tablet Take  1 tablet (0.5 mg total) by mouth at bedtime as needed for sleep.  Marland Kitchen aspirin 81 MG tablet Take 160 mg by mouth daily.    . carbonyl iron (FEOSOL) 45 MG TABS Take one by mouth daily  . Cholecalciferol (VITAMIN D3) 3000 UNITS TABS Take 1 tablet by mouth daily. Take one by mouth daily  . cyanocobalamin 1000 MCG tablet Take one by mouth daily   . furosemide (LASIX) 20 MG tablet Take 0.5 tablets (10 mg total) by mouth daily.  . isosorbide mononitrate (IMDUR) 60 MG 24 hr tablet Take 1 tablet (60 mg total) by mouth daily.  . magnesium oxide (MAG-OX) 400 MG tablet Take 400 mg by mouth daily.    . Omega-3 Fatty Acids (FISH OIL) 1200 MG CAPS Take one by mouth daily   . pravastatin (PRAVACHOL) 80 MG tablet TAKE 1 TABLET EVERY DAY  . TIMOLOL MALEATE PF OP Use one drop both eyes 2 times a day   . [DISCONTINUED] ALPRAZolam (XANAX) 0.5 MG tablet Take 1 tablet (0.5 mg total) by mouth at bedtime as needed for sleep.  Marland Kitchen amLODipine (NORVASC) 5 MG tablet Take 1 tablet (5 mg total) by mouth daily.  Marland Kitchen estradiol (ESTRACE)  0.1 MG/GM vaginal cream Place 3.38 Applicatorfuls vaginally 3 (three) times a week.  . Estradiol 10 MCG TABS Place 1 tablet (10 mcg total) vaginally every 3 (three) days.  . sodium polystyrene (KAYEXALATE) 15 GM/60ML suspension Take 60 mLs (15 g total) by mouth once.

## 2013-10-28 NOTE — Assessment & Plan Note (Addendum)
Review of past creatinines show that she has had a decreased GFR for some time. Cr is stable .  Sees Nephrology bid .  Avoiding NSAID  Lab Results  Component Value Date   CREATININE 1.1 10/18/2013

## 2013-10-28 NOTE — Assessment & Plan Note (Addendum)
S/p TVH/BSO with anterior colporrhaphy July 2014 for pelvic organ prolapse

## 2013-10-28 NOTE — Assessment & Plan Note (Addendum)
Currently managed with pravastatin. She has a history of intolerance to simvastatin.  Lab Results  Component Value Date   CHOL 156 10/18/2013   HDL 52.40 10/18/2013   LDLCALC 89 10/18/2013   TRIG 74.0 10/18/2013   CHOLHDL 3 10/18/2013

## 2013-10-28 NOTE — Assessment & Plan Note (Signed)
Followed by Dr. [name], Asymptomatic.  . She's on the appropriate medications. However lisinopril had to be stopped due to recurrent hyperkalemia.     

## 2013-10-28 NOTE — Assessment & Plan Note (Signed)
Annual comprehensive exam was done including breast, excluding pelvic and PAP smear. All screenings have been addressed .  

## 2013-10-28 NOTE — Assessment & Plan Note (Signed)
Recurrent, first time treated in ER second time with home Kayexalate. Secondary to lisinopril which  Has  been stopped .

## 2013-10-31 NOTE — Telephone Encounter (Signed)
Pt aware.

## 2013-10-31 NOTE — Telephone Encounter (Signed)
[facility] and [facility] both do not need referral as they are [facility].

## 2013-11-20 ENCOUNTER — Telehealth: Payer: Self-pay | Admitting: Internal Medicine

## 2013-11-20 NOTE — Telephone Encounter (Signed)
FYI- see below note

## 2013-11-20 NOTE — Telephone Encounter (Signed)
Patient Information:  Caller Name: Sabirin  Phone: [phone]  Patient: Audrey Duran, Audrey Duran  Gender: Female  DOB: 01/25/1935  Age: 79 Years  PCP: Deborra Medina (Adults only)  Office Follow Up:  Does the office need to follow up with this patient?: No  Instructions For The Office: N/A   Symptoms  Reason For Call & Symptoms: Headache with forehead pressure noted Saturday 4/25 and noted slight dizziness.  If sitting felt okay.  Some dizziness on Sunday 4/26 but able to do usual activities.  Noting runny nose worse if outside Sat and Sun.    Took 1/2 Sinus pill yesterday then symptoms went away.  Decided to monitor BP and found Np 142/64 on Sunday, 150/44 this am.  Feeling no symptoms at all this am 4/27.  Reviewed Health History In EMR: Yes  Reviewed Medications In EMR: Yes  Reviewed Allergies In EMR: Yes  Reviewed Surgeries / Procedures: Yes  Date of Onset of Symptoms: 11/18/2013  Treatments Tried: 1/2 sinus tablet  Treatments Tried Worked: Yes  Guideline(s) Used:  Colds  Hay Fever - Nasal Allergies  Disposition Per Guideline:   See Within 2 Weeks in Office  Reason For Disposition Reached:   Nasal allergies occur only certain times of year and diagnosis of hay fever has never been confirmed by a physician  Advice Given:  Wash off Pollen Daily:  Remove pollen from the body with hair washing and a shower, especially before bedtime.  Wash off Pollen Daily:  Remove pollen from the body with hair washing and a shower, especially before bedtime.  Avoiding Pollen:  Stay indoors on windy days  Keep windows closed in home, at least in bedroom; use air conditioner  Use a high efficiency house air filter (HEPA or electrostatic)  Keep windows closed in car, turn AC on recirculate  Call Back If:  You become worse  Patient Will Follow Care Advice:  [place] for evaluation in the next 2 weeks.

## 2013-12-13 ENCOUNTER — Other Ambulatory Visit: Payer: Self-pay | Admitting: Internal Medicine

## 2013-12-28 ENCOUNTER — Other Ambulatory Visit: Payer: Self-pay | Admitting: Internal Medicine

## 2014-02-15 DIAGNOSIS — I429 Cardiomyopathy, unspecified: Secondary | ICD-10-CM | POA: Insufficient documentation

## 2014-02-15 DIAGNOSIS — Z9889 Other specified postprocedural states: Secondary | ICD-10-CM | POA: Insufficient documentation

## 2014-02-15 DIAGNOSIS — I509 Heart failure, unspecified: Secondary | ICD-10-CM | POA: Insufficient documentation

## 2014-05-01 ENCOUNTER — Telehealth: Payer: Self-pay | Admitting: *Deleted

## 2014-05-01 DIAGNOSIS — E785 Hyperlipidemia, unspecified: Secondary | ICD-10-CM

## 2014-05-01 DIAGNOSIS — Z79899 Other long term (current) drug therapy: Secondary | ICD-10-CM

## 2014-05-01 NOTE — Telephone Encounter (Signed)
Pt is coming in tomorrow what labs and dx?  

## 2014-05-02 ENCOUNTER — Other Ambulatory Visit (INDEPENDENT_AMBULATORY_CARE_PROVIDER_SITE_OTHER): Payer: Medicare HMO

## 2014-05-02 DIAGNOSIS — Z79899 Other long term (current) drug therapy: Secondary | ICD-10-CM

## 2014-05-02 DIAGNOSIS — E785 Hyperlipidemia, unspecified: Secondary | ICD-10-CM

## 2014-05-02 LAB — LIPID PANEL
CHOL/HDL RATIO: 3
Cholesterol: 142 mg/dL (ref 0–200)
HDL: 45.2 mg/dL (ref >39.00)
LDL Cholesterol: 86 mg/dL (ref 0–99)
NonHDL: 96.8
Triglycerides: 56 mg/dL (ref 0.0–149.0)
VLDL: 11.2 mg/dL (ref 0.0–40.0)

## 2014-05-02 LAB — COMPREHENSIVE METABOLIC PANEL
ALBUMIN: 3.3 g/dL — AB (ref 3.5–5.2)
ALT: 15 U/L (ref 0–35)
AST: 18 U/L (ref 0–37)
Alkaline Phosphatase: 65 U/L (ref 39–117)
BUN: 19 mg/dL (ref 6–23)
CO2: 24 meq/L (ref 19–32)
CREATININE: 1 mg/dL (ref 0.4–1.2)
Calcium: 10.3 mg/dL (ref 8.4–10.5)
Chloride: 104 mEq/L (ref 96–112)
GFR: 58.51 mL/min — AB (ref >60.00)
Glucose, Bld: 104 mg/dL — ABNORMAL HIGH (ref 70–99)
POTASSIUM: 4.4 meq/L (ref 3.5–5.1)
Sodium: 137 mEq/L (ref 135–145)
TOTAL PROTEIN: 7.5 g/dL (ref 6.0–8.3)
Total Bilirubin: 0.5 mg/dL (ref 0.2–1.2)

## 2014-05-03 ENCOUNTER — Encounter: Payer: Self-pay | Admitting: *Deleted

## 2014-05-07 ENCOUNTER — Ambulatory Visit (INDEPENDENT_AMBULATORY_CARE_PROVIDER_SITE_OTHER): Payer: Medicare HMO | Admitting: Internal Medicine

## 2014-05-07 ENCOUNTER — Encounter: Payer: Self-pay | Admitting: Internal Medicine

## 2014-05-07 VITALS — BP 124/72 | HR 59 | Temp 97.8°F | Resp 14 | Ht 63.9 in | Wt 142.8 lb

## 2014-05-07 DIAGNOSIS — I482 Chronic atrial fibrillation, unspecified: Secondary | ICD-10-CM

## 2014-05-07 DIAGNOSIS — E785 Hyperlipidemia, unspecified: Secondary | ICD-10-CM

## 2014-05-07 DIAGNOSIS — N183 Chronic kidney disease, stage 3 unspecified: Secondary | ICD-10-CM

## 2014-05-07 DIAGNOSIS — R296 Repeated falls: Secondary | ICD-10-CM

## 2014-05-07 DIAGNOSIS — Z79899 Other long term (current) drug therapy: Secondary | ICD-10-CM

## 2014-05-07 DIAGNOSIS — E559 Vitamin D deficiency, unspecified: Secondary | ICD-10-CM

## 2014-05-07 DIAGNOSIS — I255 Ischemic cardiomyopathy: Secondary | ICD-10-CM

## 2014-05-07 DIAGNOSIS — Z9181 History of falling: Secondary | ICD-10-CM

## 2014-05-07 DIAGNOSIS — Z90710 Acquired absence of both cervix and uterus: Secondary | ICD-10-CM

## 2014-05-07 MED ORDER — PRAVASTATIN SODIUM 80 MG PO TABS: ORAL_TABLET | ORAL | Status: DC

## 2014-05-07 MED ORDER — OSELTAMIVIR PHOSPHATE 75 MG PO CAPS: 75.0000 mg | ORAL_CAPSULE | Freq: Two times a day (BID) | ORAL | Status: DC

## 2014-05-07 NOTE — Patient Instructions (Signed)
You are doing well!  Save the tamiflu for if you develop sigsn or symptoms of the flu  Influenza Influenza ("the flu") is a viral infection of the respiratory tract. It occurs more often in winter months because people spend more time in close contact with one another. Influenza can make you feel very sick. Influenza easily spreads from person to person (contagious). CAUSES  Influenza is caused by a virus that infects the respiratory tract. You can catch the virus by breathing in droplets from an infected person's cough or sneeze. You can also catch the virus by touching something that was recently contaminated with the virus and then touching your mouth, nose, or eyes. RISKS AND COMPLICATIONS You may be at risk for a more severe case of influenza if you smoke cigarettes, have diabetes, have chronic heart disease (such as heart failure) or lung disease (such as asthma), or if you have a weakened immune system. Elderly people and pregnant women are also at risk for more serious infections. The most common problem of influenza is a lung infection (pneumonia). Sometimes, this problem can require emergency medical care and may be life threatening. SIGNS AND SYMPTOMS  Symptoms typically last 4 to 10 days and may include:  Fever.  Chills.  Headache, body aches, and muscle aches.  Sore throat.  Chest discomfort and cough.  Poor appetite.  Weakness or feeling tired.  Dizziness.  Nausea or vomiting. DIAGNOSIS  Diagnosis of influenza is often made based on your history and a physical exam. A nose or throat swab test can be done to confirm the diagnosis. TREATMENT  In mild cases, influenza goes away on its own. Treatment is directed at relieving symptoms. For more severe cases, your health care provider may prescribe antiviral medicines to shorten the sickness. Antibiotic medicines are not effective because the infection is caused by a virus, not by bacteria. HOME CARE INSTRUCTIONS  Take  medicines only as directed by your health care provider.  Use a cool mist humidifier to make breathing easier.  Get plenty of rest until your temperature returns to normal. This usually takes 3 to 4 days.  Drink enough fluid to keep your urine clear or pale yellow.  Cover yourmouth and nosewhen coughing or sneezing,and wash your handswellto prevent thevirusfrom spreading.  Stay homefromwork orschool untilthe fever is gonefor at least 8full day. PREVENTION  An annual influenza vaccination (flu shot) is the best way to avoid getting influenza. An annual flu shot is now routinely recommended for all adults in the [place] IF:  You experiencechest pain, yourcough worsens,or you producemore mucus.  Youhave nausea,vomiting, ordiarrhea.  Your fever returns or gets worse. SEEK IMMEDIATE MEDICAL CARE IF:  You havetrouble breathing, you become short of breath,or your skin ornails becomebluish.  You have severe painor stiffnessin the neck.  You develop a sudden headache, or pain in the face or ear.  You have nausea or vomiting that you cannot control. MAKE SURE YOU:   Understand these instructions.  Will watch your condition.  Will get help right away if you are not doing well or get worse. Document Released: 07/10/2000 Document Revised: 11/27/2013 Document Reviewed: 10/12/2011 [facility] Patient Information 2015 [place], [place]. This information is not intended to replace advice given to you by your health care provider. Make sure you discuss any questions you have with your health care provider.

## 2014-05-07 NOTE — Progress Notes (Signed)
Patient ID: Audrey Duran, female   DOB: 1935/02/14, 79 y.o.   MRN: [id number]   Patient Active Problem List   Diagnosis Date Noted  . Long-term use of high-risk medication 05/08/2014  . S/P TAH-BSO (total abdominal hysterectomy and bilateral salpingo-oophorectomy) 10/26/2013  . Vitamin D deficiency 10/12/2013  . Abnormal stress test 11/18/2012  . CKD (chronic kidney disease) stage 3, GFR 30-59 ml/min 10/19/2012  . Hypomagnesemia 09/21/2012  . Eosinophilia 09/21/2012  . Hyperkalemia 09/21/2012  . Anxiety state, unspecified 09/20/2012  . Osteopenia of the elderly 06/20/2012  . At high risk for falls 06/20/2012  . Routine general medical examination at a health care facility 04/25/2012  . Vaginitis 01/17/2012  . Atrial fibrillation   . Anemia   . Recurrent HSV (herpes simplex virus)   . COPD (chronic obstructive pulmonary disease)   . Ischemic cardiomyopathy   . Cystocele or rectocele with complete uterine prolapse 08/02/2011  . Screening for breast cancer 07/31/2011  . Screening for cervical cancer 07/31/2011  . Screening for colon cancer 07/31/2011  . Coronary artery disease   . Myocardial infarction   . Hyperlipidemia     Subjective:  CC:   Chief Complaint  Patient presents with  . Follow-up    6 month gneral follow up. Discuss different brand of iron.    HPI:   Audrey Duran is a 79 y.o. 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    No back pain ,  sleeping well.  Feels very busy all the time . Kids are ill behaved and intolerant of even minimal hardships.   No falls, has seen an improvement in  her balance since participating in SS.    Seeing nephrology every 6 months,  Due in Christmas    Past Medical History  Diagnosis Date  . Coronary artery disease 1995    s/p AMI , no history of stents,  [name]  .  Myocardial infarction   . Atrial fibrillation   . Anemia   . Hyperlipidemia   . Recurrent HSV (herpes simplex virus)   . COPD (chronic obstructive pulmonary disease)   . Ischemic cardiomyopathy Dec 2011    ETT Sestamibi study apical scar, no ischemia, [name]    Past Surgical History  Procedure Laterality Date  . Cataract extraction  2004, 2006  . Carotid endarterectomy  July 2007    Dew, right carotid  . Joint replacement  July 2013    total hip , Sabra Heck  . Abdominal hysterectomy  june 2014       The following portions of the patient's history were reviewed and updated as appropriate: Allergies, current medications, and problem list.    Review of Systems:   Patient denies headache, fevers, malaise, unintentional weight loss, skin rash, eye pain, sinus congestion and sinus pain, sore throat, dysphagia,  hemoptysis , cough, dyspnea, wheezing, chest pain, palpitations, orthopnea, edema, abdominal pain, nausea, melena, diarrhea, constipation, flank pain, dysuria, hematuria, urinary  Frequency, nocturia, numbness, tingling, seizures,  Focal weakness, Loss of consciousness,  Tremor, insomnia, depression, anxiety, and suicidal ideation.     History   Social History  . Marital Status: Divorced    Spouse Name: N/A    Number of Children: N/A  . Years of Education: N/A   Occupational History  . Not  on file.   Social History Main Topics  . Smoking status: Former Smoker    Quit date: 01/13/1992  . Smokeless tobacco: Never Used  . Alcohol Use: No  . Drug Use: No  . Sexual Activity: Not Currently   Other Topics Concern  . Not on file   Social History Narrative  . No narrative on file    Objective:  Filed Vitals:   05/07/14 0939  BP: 124/72  Pulse: 59  Temp: 97.8 F (36.6 C)  Resp: 14     General appearance: alert, cooperative and appears stated age Ears: normal TM's and external ear canals both ears Throat: lips, mucosa, and tongue normal; teeth and gums  normal Neck: no adenopathy, no carotid bruit, supple, symmetrical, trachea midline and thyroid not enlarged, symmetric, no tenderness/mass/nodules Back: symmetric, no curvature. ROM normal. No CVA tenderness. Lungs: clear to auscultation bilaterally Heart: regular rate and rhythm, S1, S2 normal, no murmur, click, rub or gallop Abdomen: soft, non-tender; bowel sounds normal; no masses,  no organomegaly Pulses: 2+ and symmetric Skin: Skin color, texture, turgor normal. No rashes or lesions Lymph nodes: Cervical, supraclavicular, and axillary nodes normal.  Assessment and Plan:  Ischemic cardiomyopathy Followed by Dr. Saralyn Pilar, Asymptomatic.  . She's on the appropriate medications. However lisinopril had to be stopped due to recurrent hyperkalemia.      Atrial fibrillation Rate controlled. No changes today. No anticoagulation given fall risk.    CKD (chronic kidney disease) stage 3, GFR 30-59 ml/min Review of past creatinines show that she has had a decreased GFR for some time. Cr is stable .  Sees Nephrology bid .  Avoiding NSAIDs  Lab Results  Component Value Date   CREATININE 1.0 05/02/2014       Vitamin D deficiency Normal with replacement last March.   Hyperlipidemia Currently managed with pravastatin. LFTS are normal. She has a history of intolerance to simvastatin.  Lab Results  Component Value Date   CHOL 142 05/02/2014   HDL 45.20 05/02/2014   LDLCALC 86 05/02/2014   TRIG 56.0 05/02/2014   CHOLHDL 3 05/02/2014    Lab Results  Component Value Date   ALT 15 05/02/2014   AST 18 05/02/2014   ALKPHOS 65 05/02/2014   BILITOT 0.5 05/02/2014        Long-term use of high-risk medication  on current statin therapy.   Liver enzymes are normal , no changes today.  Lab Results  Component Value Date   ALT 15 05/02/2014   AST 18 05/02/2014   ALKPHOS 65 05/02/2014   BILITOT 0.5 05/02/2014     At high risk for falls History of hip fracture Sept 2013.  Wears  Lifeline.  Balance improved with participation inf Silver Sneakers 3/week,  No recent falls.    A total of 25 minutes of face to face time was spent with patient more than half of which was spent in counselling and coordination of care  .   Updated Medication List Outpatient Encounter Prescriptions as of 05/07/2014  Medication Sig  . acyclovir (ZOVIRAX) 400 MG tablet Take 1 tablet (400 mg total) by mouth daily.  Marland Kitchen alendronate (FOSAMAX) 70 MG tablet Take 1 tablet (70 mg total) by mouth every 7 (seven) days. Take with a full glass of water on an empty stomach.  . ALPRAZolam (XANAX) 0.5 MG tablet Take 1 tablet (0.5 mg total) by mouth at bedtime as needed for sleep.  Marland Kitchen amLODipine (NORVASC) 5 MG tablet Take 1 tablet (5 mg  total) by mouth daily.  Marland Kitchen aspirin 81 MG tablet Take 160 mg by mouth daily.    . carbonyl iron (FEOSOL) 45 MG TABS Take one by mouth daily  . Cholecalciferol (VITAMIN D3) 3000 UNITS TABS Take 1 tablet by mouth daily. Take one by mouth daily  . cyanocobalamin 1000 MCG tablet Take one by mouth daily   . estradiol (ESTRACE) 0.1 MG/GM vaginal cream Place 1.91 Applicatorfuls vaginally 3 (three) times a week.  . Estradiol 10 MCG TABS Place 1 tablet (10 mcg total) vaginally every 3 (three) days.  . furosemide (LASIX) 20 MG tablet TAKE 1/2 TABLET (10 MG TOTAL) BY MOUTH DAILY.  . isosorbide mononitrate (IMDUR) 60 MG 24 hr tablet TAKE 1 TABLET EVERY DAY  . magnesium oxide (MAG-OX) 400 MG tablet Take 400 mg by mouth daily.    . Omega-3 Fatty Acids (FISH OIL) 1200 MG CAPS Take one by mouth daily   . oseltamivir (TAMIFLU) 75 MG capsule Take 1 capsule (75 mg total) by mouth 2 (two) times daily.  . pravastatin (PRAVACHOL) 80 MG tablet TAKE 1 TABLET EVERY DAY  . sodium polystyrene (KAYEXALATE) 15 GM/60ML suspension Take 60 mLs (15 g total) by mouth once.  Marland Kitchen TIMOLOL MALEATE PF OP Use one drop both eyes 2 times a day   . [DISCONTINUED] pravastatin (PRAVACHOL) 80 MG tablet TAKE 1 TABLET EVERY  DAY     No orders of the defined types were placed in this encounter.    No Follow-up on file.

## 2014-05-08 DIAGNOSIS — Z79899 Other long term (current) drug therapy: Secondary | ICD-10-CM | POA: Insufficient documentation

## 2014-05-08 NOTE — Assessment & Plan Note (Signed)
Rate controlled. No changes today. No anticoagulation given fall risk.

## 2014-05-08 NOTE — Assessment & Plan Note (Signed)
History of hip fracture Sept 2013.  Wears Lifeline.  Balance improved with participation inf Silver Sneakers 3/week,  No recent falls.

## 2014-05-08 NOTE — Assessment & Plan Note (Signed)
Currently managed with pravastatin. LFTS are normal. She has a history of intolerance to simvastatin.  Lab Results  Component Value Date   CHOL 142 05/02/2014   HDL 45.20 05/02/2014   LDLCALC 86 05/02/2014   TRIG 56.0 05/02/2014   CHOLHDL 3 05/02/2014    Lab Results  Component Value Date   ALT 15 05/02/2014   AST 18 05/02/2014   ALKPHOS 65 05/02/2014   BILITOT 0.5 05/02/2014

## 2014-05-08 NOTE — Assessment & Plan Note (Signed)
on current statin therapy.   Liver enzymes are normal , no changes today.  Lab Results  Component Value Date   ALT 15 05/02/2014   AST 18 05/02/2014   ALKPHOS 65 05/02/2014   BILITOT 0.5 05/02/2014

## 2014-05-08 NOTE — Assessment & Plan Note (Signed)
Followed by Dr. Saralyn Pilar, Asymptomatic.  . She's on the appropriate medications. However lisinopril had to be stopped due to recurrent hyperkalemia.

## 2014-05-08 NOTE — Assessment & Plan Note (Signed)
Review of past creatinines show that she has had a decreased GFR for some time. Cr is stable .  Sees Nephrology bid .  Avoiding NSAIDs  Lab Results  Component Value Date   CREATININE 1.0 05/02/2014

## 2014-05-08 NOTE — Assessment & Plan Note (Signed)
Normal with replacement last March.

## 2014-07-12 ENCOUNTER — Other Ambulatory Visit: Payer: Self-pay | Admitting: Internal Medicine

## 2014-07-24 ENCOUNTER — Ambulatory Visit: Payer: Self-pay | Admitting: Internal Medicine

## 2014-07-24 LAB — HM MAMMOGRAPHY: HM MAMMO: NEGATIVE

## 2014-08-09 LAB — CBC AND DIFFERENTIAL
Hemoglobin: 14.2 g/dL (ref 12.0–16.0)
Platelets: 228 10*3/uL (ref 150–399)

## 2014-08-09 LAB — BASIC METABOLIC PANEL
BUN: 14 mg/dL (ref 4–21)
CREATININE: 1 mg/dL (ref 0.5–1.1)
POTASSIUM: 5.1 mmol/L (ref 3.4–5.3)
SODIUM: 139 mmol/L (ref 137–147)

## 2014-08-28 ENCOUNTER — Other Ambulatory Visit: Payer: Self-pay | Admitting: *Deleted

## 2014-08-28 ENCOUNTER — Telehealth: Payer: Self-pay | Admitting: Internal Medicine

## 2014-08-28 DIAGNOSIS — B009 Herpesviral infection, unspecified: Secondary | ICD-10-CM

## 2014-08-28 MED ORDER — ACYCLOVIR 400 MG PO TABS: 400.0000 mg | ORAL_TABLET | Freq: Every day | ORAL | Status: DC

## 2014-08-28 NOTE — Telephone Encounter (Signed)
acyclovir (ZOVIRAX) 400 MG tablet

## 2014-08-28 NOTE — Telephone Encounter (Signed)
Spoke with pt, rx sent to [facility]

## 2014-11-07 ENCOUNTER — Encounter: Payer: Self-pay | Admitting: Internal Medicine

## 2014-11-07 ENCOUNTER — Ambulatory Visit (INDEPENDENT_AMBULATORY_CARE_PROVIDER_SITE_OTHER): Payer: PPO | Admitting: Internal Medicine

## 2014-11-07 VITALS — BP 136/74 | HR 58 | Temp 97.6°F | Resp 14 | Ht 63.5 in | Wt 145.5 lb

## 2014-11-07 DIAGNOSIS — I2510 Atherosclerotic heart disease of native coronary artery without angina pectoris: Secondary | ICD-10-CM

## 2014-11-07 DIAGNOSIS — M899 Disorder of bone, unspecified: Secondary | ICD-10-CM | POA: Diagnosis not present

## 2014-11-07 DIAGNOSIS — N183 Chronic kidney disease, stage 3 unspecified: Secondary | ICD-10-CM

## 2014-11-07 DIAGNOSIS — R9439 Abnormal result of other cardiovascular function study: Secondary | ICD-10-CM

## 2014-11-07 DIAGNOSIS — I255 Ischemic cardiomyopathy: Secondary | ICD-10-CM

## 2014-11-07 DIAGNOSIS — E785 Hyperlipidemia, unspecified: Secondary | ICD-10-CM

## 2014-11-07 DIAGNOSIS — Z1159 Encounter for screening for other viral diseases: Secondary | ICD-10-CM

## 2014-11-07 DIAGNOSIS — B009 Herpesviral infection, unspecified: Secondary | ICD-10-CM

## 2014-11-07 DIAGNOSIS — R5383 Other fatigue: Secondary | ICD-10-CM

## 2014-11-07 DIAGNOSIS — E559 Vitamin D deficiency, unspecified: Secondary | ICD-10-CM

## 2014-11-07 DIAGNOSIS — Z1211 Encounter for screening for malignant neoplasm of colon: Secondary | ICD-10-CM

## 2014-11-07 DIAGNOSIS — Z0000 Encounter for general adult medical examination without abnormal findings: Secondary | ICD-10-CM

## 2014-11-07 DIAGNOSIS — Z79899 Other long term (current) drug therapy: Secondary | ICD-10-CM

## 2014-11-07 DIAGNOSIS — M8580 Other specified disorders of bone density and structure, unspecified site: Secondary | ICD-10-CM

## 2014-11-07 LAB — TSH: TSH: 2.82 u[IU]/mL (ref 0.35–4.50)

## 2014-11-07 LAB — COMPREHENSIVE METABOLIC PANEL
ALT: 14 U/L (ref 0–35)
AST: 14 U/L (ref 0–37)
Albumin: 4 g/dL (ref 3.5–5.2)
Alkaline Phosphatase: 79 U/L (ref 39–117)
BUN: 19 mg/dL (ref 6–23)
CHLORIDE: 104 meq/L (ref 96–112)
CO2: 25 mEq/L (ref 19–32)
CREATININE: 1.03 mg/dL (ref 0.40–1.20)
Calcium: 10.4 mg/dL (ref 8.4–10.5)
GFR: 55.17 mL/min — ABNORMAL LOW (ref >60.00)
Glucose, Bld: 109 mg/dL — ABNORMAL HIGH (ref 70–99)
Potassium: 4.3 mEq/L (ref 3.5–5.1)
Sodium: 140 mEq/L (ref 135–145)
Total Bilirubin: 0.5 mg/dL (ref 0.2–1.2)
Total Protein: 7 g/dL (ref 6.0–8.3)

## 2014-11-07 LAB — CBC WITH DIFFERENTIAL/PLATELET
Basophils Absolute: 0 10*3/uL (ref 0.0–0.1)
Basophils Relative: 0.5 % (ref 0.0–3.0)
EOS ABS: 0.2 10*3/uL (ref 0.0–0.7)
EOS PCT: 2.4 % (ref 0.0–5.0)
HCT: 42.8 % (ref 36.0–46.0)
Hemoglobin: 14.4 g/dL (ref 12.0–15.0)
Lymphocytes Relative: 21.8 % (ref 12.0–46.0)
Lymphs Abs: 1.9 10*3/uL (ref 0.7–4.0)
MCHC: 33.5 g/dL (ref 30.0–36.0)
MCV: 89.6 fl (ref 78.0–100.0)
MONO ABS: 0.6 10*3/uL (ref 0.1–1.0)
MONOS PCT: 7.3 % (ref 3.0–12.0)
NEUTROS ABS: 6 10*3/uL (ref 1.4–7.7)
NEUTROS PCT: 68 % (ref 43.0–77.0)
PLATELETS: 212 10*3/uL (ref 150.0–400.0)
RBC: 4.78 Mil/uL (ref 3.87–5.11)
RDW: 13.5 % (ref 11.5–15.5)
WBC: 8.9 10*3/uL (ref 4.0–10.5)

## 2014-11-07 LAB — LIPID PANEL
CHOLESTEROL: 164 mg/dL (ref 0–200)
HDL: 59.1 mg/dL (ref >39.00)
LDL Cholesterol: 92 mg/dL (ref 0–99)
NONHDL: 104.9
Total CHOL/HDL Ratio: 3
Triglycerides: 64 mg/dL (ref 0.0–149.0)
VLDL: 12.8 mg/dL (ref 0.0–40.0)

## 2014-11-07 LAB — VITAMIN D 25 HYDROXY (VIT D DEFICIENCY, FRACTURES): VITD: 45.39 ng/mL (ref 30.00–100.00)

## 2014-11-07 MED ORDER — ACYCLOVIR 400 MG PO TABS: 400.0000 mg | ORAL_TABLET | Freq: Every day | ORAL | Status: DC

## 2014-11-07 MED ORDER — FUROSEMIDE 20 MG PO TABS: ORAL_TABLET | ORAL | Status: DC

## 2014-11-07 NOTE — Patient Instructions (Signed)
Please talk to your son and daughter about your desire NOT TO BE RESUSCITATED before you display them in your home  [place] that specify your wishes when the time comes,   [place] Maintenance Adopting a healthy lifestyle and getting preventive care can go a long way to promote health and wellness. Talk with your health care provider about what schedule of regular examinations is right for you. This is a good chance for you to check in with your provider about disease prevention and staying healthy. In between checkups, there are plenty of things you can do on your own. Experts have done a lot of research about which lifestyle changes and preventive measures are most likely to keep you healthy. Ask your health care provider for more information. WEIGHT AND DIET  Eat a healthy diet  Be sure to include plenty of vegetables, fruits, low-fat dairy products, and lean protein.  Do not eat a lot of foods high in solid fats, added sugars, or salt.  Get regular exercise. This is one of the most important things you can do for your health.  Most adults should exercise for at least 150 minutes each week. The exercise should increase your heart rate and make you sweat (moderate-intensity exercise).  Most adults should also do strengthening exercises at least twice a week. This is in addition to the moderate-intensity exercise.  Maintain a healthy weight  Body mass index (BMI) is a measurement that can be used to identify possible weight problems. It estimates body fat based on height and weight. Your health care provider can help determine your BMI and help you achieve or maintain a healthy weight.  For females 16 years of age and older:   A BMI below 18.5 is considered underweight.  A BMI of 18.5 to 24.9 is normal.  A BMI of 25 to 29.9 is considered overweight.  A BMI of 30 and above is considered obese.  Watch levels of  cholesterol and blood lipids  You should start having your blood tested for lipids and cholesterol at 77 years of age, then have this test every 5 years.  You may need to have your cholesterol levels checked more often if:  Your lipid or cholesterol levels are high.  You are older than 78 years of age.  You are at high risk for heart disease.  CANCER SCREENING   Lung Cancer  Lung cancer screening is recommended for adults 25-72 years old who are at high risk for lung cancer because of a history of smoking.  A yearly low-dose CT scan of the lungs is recommended for people who:  Currently smoke.  Have quit within the past 15 years.  Have at least a 30-pack-year history of smoking. A pack year is smoking an average of one pack of cigarettes a day for 1 year.  Yearly screening should continue until it has been 15 years since you quit.  Yearly screening should stop if you develop a health problem that would prevent you from having lung cancer treatment.  Breast Cancer  Practice breast self-awareness. This means understanding how your breasts normally appear and feel.  It also means doing regular breast self-exams. Let your health care provider know about any changes, no matter how small.  If you are in your 20s or 30s, you should have a clinical breast exam (CBE) by a health care provider every 1-3 years as part  of a regular health exam.  If you are 40 or older, have a CBE every year. Also consider having a breast X-ray (mammogram) every year.  If you have a family history of breast cancer, talk to your health care provider about genetic screening.  If you are at high risk for breast cancer, talk to your health care provider about having an MRI and a mammogram every year.  Breast cancer gene (BRCA) assessment is recommended for women who have family members with BRCA-related cancers. BRCA-related cancers include:  Breast.  Ovarian.  Tubal.  Peritoneal  cancers.  Results of the assessment will determine the need for genetic counseling and BRCA1 and BRCA2 testing. Cervical Cancer Routine pelvic examinations to screen for cervical cancer are no longer recommended for nonpregnant women who are considered low risk for cancer of the pelvic organs (ovaries, uterus, and vagina) and who do not have symptoms. A pelvic examination may be necessary if you have symptoms including those associated with pelvic infections. Ask your health care provider if a screening pelvic exam is right for you.   The Pap test is the screening test for cervical cancer for women who are considered at risk.  If you had a hysterectomy for a problem that was not cancer or a condition that could lead to cancer, then you no longer need Pap tests.  If you are older than 65 years, and you have had normal Pap tests for the past 10 years, you no longer need to have Pap tests.  If you have had past treatment for cervical cancer or a condition that could lead to cancer, you need Pap tests and screening for cancer for at least 20 years after your treatment.  If you no longer get a Pap test, assess your risk factors if they change (such as having a new sexual partner). This can affect whether you should start being screened again.  Some women have medical problems that increase their chance of getting cervical cancer. If this is the case for you, your health care provider may recommend more frequent screening and Pap tests.  The human papillomavirus (HPV) test is another test that may be used for cervical cancer screening. The HPV test looks for the virus that can cause cell changes in the cervix. The cells collected during the Pap test can be tested for HPV.  The HPV test can be used to screen women 30 years of age and older. Getting tested for HPV can extend the interval between normal Pap tests from three to five years.  An HPV test also should be used to screen women of any age who  have unclear Pap test results.  After 78 years of age, women should have HPV testing as often as Pap tests.  Colorectal Cancer  This type of cancer can be detected and often prevented.  Routine colorectal cancer screening usually begins at 78 years of age and continues through 78 years of age.  Your health care provider may recommend screening at an earlier age if you have risk factors for colon cancer.  Your health care provider may also recommend using home test kits to check for hidden blood in the stool.  A small camera at the end of a tube can be used to examine your colon directly (sigmoidoscopy or colonoscopy). This is done to check for the earliest forms of colorectal cancer.  Routine screening usually begins at age 50.  Direct examination of the colon should be repeated every 5-10 years   through 78 years of age. However, you may need to be screened more often if early forms of precancerous polyps or small growths are found. Skin Cancer  Check your skin from head to toe regularly.  Tell your health care provider about any new moles or changes in moles, especially if there is a change in a mole's shape or color.  Also tell your health care provider if you have a mole that is larger than the size of a pencil eraser.  Always use sunscreen. Apply sunscreen liberally and repeatedly throughout the day.  Protect yourself by wearing long sleeves, pants, a wide-brimmed hat, and sunglasses whenever you are outside. HEART DISEASE, DIABETES, AND HIGH BLOOD PRESSURE   Have your blood pressure checked at least every 1-2 years. High blood pressure causes heart disease and increases the risk of stroke.  If you are between 55 years and 79 years old, ask your health care provider if you should take aspirin to prevent strokes.  Have regular diabetes screenings. This involves taking a blood sample to check your fasting blood sugar level.  If you are at a normal weight and have a low risk for  diabetes, have this test once every three years after 78 years of age.  If you are overweight and have a high risk for diabetes, consider being tested at a younger age or more often. PREVENTING INFECTION  Hepatitis B  If you have a higher risk for hepatitis B, you should be screened for this virus. You are considered at high risk for hepatitis B if:  You were born in a country where hepatitis B is common. Ask your health care provider which countries are considered high risk.  Your parents were born in a high-risk country, and you have not been immunized against hepatitis B (hepatitis B vaccine).  You have HIV or AIDS.  You use needles to inject street drugs.  You live with someone who has hepatitis B.  You have had sex with someone who has hepatitis B.  You get hemodialysis treatment.  You take certain medicines for conditions, including cancer, organ transplantation, and autoimmune conditions. Hepatitis C  Blood testing is recommended for:  Everyone born from 1945 through 1965.  Anyone with known risk factors for hepatitis C. Sexually transmitted infections (STIs)  You should be screened for sexually transmitted infections (STIs) including gonorrhea and chlamydia if:  You are sexually active and are younger than 78 years of age.  You are older than 78 years of age and your health care provider tells you that you are at risk for this type of infection.  Your sexual activity has changed since you were last screened and you are at an increased risk for chlamydia or gonorrhea. Ask your health care provider if you are at risk.  If you do not have HIV, but are at risk, it may be recommended that you take a prescription medicine daily to prevent HIV infection. This is called pre-exposure prophylaxis (PrEP). You are considered at risk if:  You are sexually active and do not regularly use condoms or know the HIV status of your partner(s).  You take drugs by injection.  You are  sexually active with a partner who has HIV. Talk with your health care provider about whether you are at high risk of being infected with HIV. If you choose to begin PrEP, you should first be tested for HIV. You should then be tested every 3 months for as long as you are taking   PrEP.  PREGNANCY   If you are premenopausal and you may become pregnant, ask your health care provider about preconception counseling.  If you may become pregnant, take 400 to 800 micrograms (mcg) of folic acid every day.  If you want to prevent pregnancy, talk to your health care provider about birth control (contraception). OSTEOPOROSIS AND MENOPAUSE   Osteoporosis is a disease in which the bones lose minerals and strength with aging. This can result in serious bone fractures. Your risk for osteoporosis can be identified using a bone density scan.  If you are 65 years of age or older, or if you are at risk for osteoporosis and fractures, ask your health care provider if you should be screened.  Ask your health care provider whether you should take a calcium or vitamin D supplement to lower your risk for osteoporosis.  Menopause may have certain physical symptoms and risks.  Hormone replacement therapy may reduce some of these symptoms and risks. Talk to your health care provider about whether hormone replacement therapy is right for you.  HOME CARE INSTRUCTIONS   Schedule regular health, dental, and eye exams.  Stay current with your immunizations.   Do not use any tobacco products including cigarettes, chewing tobacco, or electronic cigarettes.  If you are pregnant, do not drink alcohol.  If you are breastfeeding, limit how much and how often you drink alcohol.  Limit alcohol intake to no more than 1 drink per day for nonpregnant women. One drink equals 12 ounces of beer, 5 ounces of wine, or 1 ounces of hard liquor.  Do not use street drugs.  Do not share needles.  Ask your health care provider for  help if you need support or information about quitting drugs.  Tell your health care provider if you often feel depressed.  Tell your health care provider if you have ever been abused or do not feel safe at home. Document Released: 01/26/2011 Document Revised: 11/27/2013 Document Reviewed: 06/14/2013 Exit[employer] Patient Information 2015 [employer]. This information is not intended to replace advice given to you by your health care provider. Make sure you discuss any questions you have with your health care provider.  
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
## 2014-11-08 LAB — HEPATITIS C ANTIBODY: HCV Ab: NEGATIVE

## 2014-11-10 NOTE — Assessment & Plan Note (Signed)
She is asymptomatic.   [facility] in 2014 saw an area of ischemia consistent with prior MI.  She has biannual follow up Miquel Dunn.

## 2014-11-10 NOTE — Assessment & Plan Note (Signed)
cholesterol, liver and kidney function are normal.

## 2014-11-10 NOTE — Assessment & Plan Note (Addendum)
Repeat level  Is normal.  Continue current daily supplement

## 2014-11-10 NOTE — Assessment & Plan Note (Addendum)
Currently managed with pravastatin. LFTS are normal. And LDL is < 100.   She has a history of intolerance to more potent statins including simvastatin.  Lab Results  Component Value Date   CHOL 164 11/07/2014   HDL 59.10 11/07/2014   LDLCALC 92 11/07/2014   TRIG 64.0 11/07/2014   CHOLHDL 3 11/07/2014    Lab Results  Component Value Date   ALT 14 11/07/2014   AST 14 11/07/2014   ALKPHOS 79 11/07/2014   BILITOT 0.5 11/07/2014

## 2014-11-10 NOTE — Assessment & Plan Note (Signed)
She has deferred continued screening, per discussion today

## 2014-11-10 NOTE — Assessment & Plan Note (Signed)
She is avoiding use od NSAIDs and her cr is stable .  Sees Nephrology twice annually.   Lab Results  Component Value Date   CREATININE 1.03 11/07/2014

## 2014-11-10 NOTE — Assessment & Plan Note (Signed)

## 2014-11-11 NOTE — Addendum Note (Signed)
Addended by: Crecencio Mc on: 11/11/2014 02:33 PM   Modules accepted: Level of Service

## 2014-11-12 ENCOUNTER — Encounter: Payer: Self-pay | Admitting: *Deleted

## 2014-11-13 NOTE — H&P (Signed)
PATIENT NAME:  Audrey Duran, Audrey Duran MR#:  [id number] DATE OF BIRTH:  11-07-1934  DATE OF ADMISSION:  02/16/2012  PRIMARY CARE PHYSICIAN: Dr. Gorden Harms PRIMARY CARDIOLOGIST: Dr. Isaias Cowman    SUBJECTIVE: This is a 80 year old female who presents to the ER after sustaining a fall. The patient was trying to mow her lawn and trying to pour fuel into her lawn mower when the patient turned around and fell and she could not get up from the ground. The patient kept hollering for about three hours before somebody responded to her calls. She was on the ground for about three hours. The patient denies any syncopal episodes. Denies any chest pain or shortness of breath today. Upon further questioning the patient states that she has had angina on and off,  brought on by exertion, relieved with rest. exercise capacity of climbing more than 4 to 5 stairs at a time and is unable to climb a flight of stairs. She states that she had a cardiac catheterization done in 1995 and since then has been on medical treatment under Dr. Saralyn Pilar for her coronary artery disease and her chronic stable angina. She has a remote history of smoking. Denies any fevers, chills or rigors or any intercurrent illness.   REVIEW OF SYSTEMS: CONSTITUTIONAL: No fever, fatigue, weakness, pain, weight loss or weight gain. EYES: No blurry vision, double vision, inflammation, glaucoma, cataracts. ENT: No tinnitus, ear pain, hearing loss, seasonal allergies, epistaxis. RESPIRATORY: No cough, wheezing, hemoptysis, dyspnea. CARDIOVASCULAR: No chest pain, orthopnea, edema, arrhythmia. GASTROINTESTINAL: No nausea, vomiting, abdominal pain, hematemesis. GENITOURINARY: No dysuria, hematuria, renal calculi. ENDOCRINE: No polyuria, nocturia, thyroid problems. HEME: No anemia, easy bruising, bleeding, or swollen glands. MUSCULOSKELETAL: Pain in the right hip area otherwise no neck pain, shoulder pain. She is also complaining of right wrist pain. NEURO: No  numbness, weakness, dysarthria, epilepsy, tremor, vertigo. PSYCHIATRIC: No anxiety, insomnia, bipolar disorder.   HOME MEDICATIONS:  1. Pravastatin 40 mg daily. 2. Lisinopril 40 mg p.o. daily.  3. Acyclovir 400 mg p.o. 3 times a day.  4. Isosorbide 30 mg p.o. daily.  5. Lasix 20 mg p.o. daily.  6. Estrace 0.1 mg daily.   ALLERGIES: Macrobid and Biaxin.   PHYSICAL EXAMINATION:  VITAL SIGNS: Blood pressure 154/65, pulse 72, temperature 96.3, 94% on room air.   GENERAL: Currently comfortable, in no acute cardiopulmonary distress.   HEENT: Pupils equal and reactive to light. Extraocular movements intact.   NECK: Supple. No JVD.   LUNGS: Clear to auscultation bilaterally. No wheezes or crackles.   CARDIOVASCULAR: Regular rate and rhythm. No murmurs, rubs, or gallops.   ABDOMEN: Obese, soft, nontender, nondistended.   EXTREMITIES: External rotation of right lower extremity and leg length discrepancy.   NEUROLOGIC: Cranial nerves II through XII grossly intact. Deep reflexes 2+ bilaterally. Motor strength decreased on the right side, otherwise intact in all four extremities.   PSYCHIATRIC: Appropriate mood and affect.   LYMPHATIC: No inguinal, cervical lymphadenopathy.   PAST MEDICAL HISTORY:  1. History of coronary artery disease with an MI in 1995.  2. Hypertension.  3. Dyslipidemia.   PAST SURGICAL HISTORY:  1. History of endarterectomy on the right. 2. Cataract surgeries in both eyes.   SOCIAL HISTORY: The patient quit smoking several years ago, before that she was a heavy smoker. She denies use of alcohol or illicit drugs. She lives alone with an aunt living close by.    FAMILY HISTORY: Negative for coronary artery disease, diabetes.   LABORATORY, DIAGNOSTIC AND  RADIOLOGICAL DATA: WBC 18.2, hemoglobin 11.7, hematocrit 36.9, platelet count 182.   Serum glucose 148, BUN 30, creatinine 1.23.  Sodium 133, potassium 4.8, chloride 102, bicarbonate 25, CK 79. X-ray of the  hip shows an acute intertrochanteric fracture of the right hip. X-ray of the elbow shows no acute bony abnormality of the right elbow. Chest x-ray shows mild hyperinflation with underlying chronic obstructive pulmonary disease and subtle density adjacent to the left cardiac apex. If the patient can tolerate the procedure PA and lateral chest x-ray would be of value.   ASSESSMENT:  1. Acute intertrochanteric fracture.  2. Hypertension.  3. Coronary artery disease with chronic stable angina.  4. Leukocytosis secondary to stress margination versus urinary tract infection.   PLAN:  1. The patient will be admitted to the medical service. Orthopedic consultation has been obtained. Cardiology consultation will be obtained because of patient's history for coronary artery disease as well as significant activity limitation and interrmittent chronic stable angina .  2. Will start her on perioperative beta blocker, continue aspirin and statin.  3. Will obtain a 2-D echo for preoperative clearance.  4. Will keep her n.p.o. in anticipation of surgery either tomorrow or day after tomorrow. This was discussed with the patient's daughter who is currently present by the bedside.  5. CODE STATUS: She is a FULL CODE.   time spent 70 mins   ____________________________ Reyne Dumas, MD na:cms D: 02/17/2012 00:04:46 ET T: 02/17/2012 05:47:06 ET JOB#: [id number]  cc: Reyne Dumas, MD, <Dictator> Cheryl L. Gorden Harms, MD Reyne Dumas MD ELECTRONICALLY SIGNED 02/24/2012 21:42

## 2014-11-13 NOTE — Consult Note (Signed)
PATIENT NAME:  Audrey Duran, Audrey Duran MR#:  [id number] DATE OF BIRTH:  Dec 09, 1934  DATE OF CONSULTATION:  02/17/2012  REFERRING PHYSICIAN:  Dr. Mack Guise CONSULTING PHYSICIAN:  Corey Skains, MD  PRIMARY CARE PHYSICIAN: Dr. Derrel Nip   REASON FOR CONSULTATION: Known cardiovascular disease with previous myocardial infarction having hip surgery.   CHIEF COMPLAINT: "My hip hurts."   HISTORY OF PRESENT ILLNESS: This is a 80 year old female with known coronary artery disease and previous minimal myocardial infarction in 1995 with some diffuse minimal coronary artery disease. At that time she has been placed on appropriate medications and has had no further evidence of significant myocardial infarction. The patient does have occasional episodes of chest discomfort, atypical in nature and not occurring all the time. She is doing lots of physical activity prior to her hip injury for which she has not had any chest discomfort or other significant shortness of breath. The patient was mowing her lawn when she was trying to work on her mower and slipped and fell. She has an injury at this time. EKG at this time shows normal sinus rhythm with nonspecific ST changes but no evidence of myocardial infarction. She does have some hypertension well controlled on current medical regimen and hyperlipidemia on pravastatin. Patient otherwise is feeling well from a cardiac standpoint.   REVIEW OF SYSTEMS: Remainder of her review of systems negative for vision change, ringing in the ears, hearing loss, cough, congestion, heartburn, nausea, vomiting, diarrhea, bloody stools, stomach pain, extremity pain, leg weakness, cramping of the buttocks, known blood clots, headaches, blackouts, dizzy spells, nosebleeds, congestion, trouble swallowing, frequent urination, urination at night, muscle weakness, numbness, anxiety, depression, skin lesions, skin rashes.   PAST MEDICAL HISTORY:  1. Coronary artery disease.  2. Hypertension.   3. Hyperlipidemia.   FAMILY HISTORY: Son and father had cardiovascular disease at an early age.   SOCIAL HISTORY: Currently denies alcohol or tobacco use.   ALLERGIES: No known drug allergies.   CURRENT MEDICATIONS: As listed.   PHYSICAL EXAMINATION:  VITAL SIGNS: Blood pressure 126/68 bilaterally, heart rate 72 upright, reclining, and regular.   GENERAL: She is a well appearing female in no acute distress.   HEENT: No icterus, thyromegaly, ulcers, hemorrhage, or xanthelasma.   CARDIOVASCULAR: Regular rate and rhythm with normal S1 and S2 without murmur, gallop, rub. Point of maximal impulse is normal size and placement. Carotid upstroke normal without bruit. Jugular venous pressure normal.   LUNGS: Lungs are clear to auscultation with normal respirations.   ABDOMEN: Soft, nontender without hepatosplenomegaly or masses. Abdominal aorta is normal size without bruit.   EXTREMITIES: 2+ bilateral pulses in dorsal, pedal, radial, and femoral arteries without lower extremity edema, cyanosis, clubbing, ulcers.   NEUROLOGIC: She is oriented to time, place, and person with normal mood and affect.   ASSESSMENT: 80 year old female with known coronary artery disease, previous myocardial infarction, hypertension, hyperlipidemia well controlled at this time with only atypical chest pain on rare occasion at lower risk for cardiovascular complication with surgery if medically managed.   RECOMMENDATIONS:  1. Continue hypertension control with a goal systolic blood pressure below 140 mm with current medical regimen.  2. Would consider nitrate topically 1 inch to reduce the possibility of any ischemic nature abnormality as well as for any atypical chest discomfort.  3. Perioperative telemetry and troponin, CK-MB with EKG to assess for any changes concerning for cardiovascular disease and/or myocardial infarction.  4. Aspirin 81 mg throughout surgery if able for further risk reduction.  5. Would  consider possibility of an echocardiogram prior to surgery for further evaluation of valvular heart disease, LV systolic dysfunction with history of myocardial infarction.    ____________________________ Corey Skains, MD bjk:cms D: 02/17/2012 08:56:17 ET T: 02/17/2012 09:58:20 ET JOB#: [id number]  cc: Corey Skains, MD, <Dictator> Corey Skains MD ELECTRONICALLY SIGNED 02/17/2012 13:39

## 2014-11-13 NOTE — Op Note (Signed)
PATIENT NAME:  Audrey Duran, BURNSTEIN MR#:  [id number] DATE OF BIRTH:  04-22-1935  DATE OF PROCEDURE:  02/17/2012  PREOPERATIVE DIAGNOSIS: Displaced intertrochanteric fracture of the right hip.   POSTOPERATIVE DIAGNOSIS: Displaced intertrochanteric fracture of the right hip.   PROCEDURE PERFORMED: Open reduction and internal fixation of right hip with a Synthes DHS 145 degree 4-hole compression plate, 95 mm lag screw.   SURGEON: Park Breed, M.D.   ANESTHESIA: Spinal.   COMPLICATIONS: None.   DRAINS: One Hemovac.   ESTIMATED BLOOD LOSS: 250 mL.  REPLACEMENTS: None.   DESCRIPTION OF PROCEDURE: The patient was brought to the Operating Room where she underwent satisfactory spinal anesthesia and was placed on the fracture table. The left leg was flexed and abducted. The right leg was manipulated to reduce the displacement and placed in gentle traction with internal rotation. Fluoroscopy showed anatomic reduction of the fracture. The hip was prepped and draped in sterile fashion. A longitudinal incision was made laterally. Dissection was carried out sharply through subcutaneous tissue and fascia. The vastus lateralis muscle was elevated anteriorly exposing the lateral shaft. A 1/4 inch drill hole was made in the lateral shaft and a guidepin inserted at a 145 degree angle under fluoroscopic control into good position of the head on AP and lateral views. This measured a depth appropriate for about a 95 mm screw. A step-cup reamer was used to enlarge the opening and the tap was used on the tract for the screw. A 95 mm lag screw with a 145 degree four hole plate was inserted and fit snugly. The traction was released. The plate was fixed with four cortical screws. Fluoroscopy showed the fracture to be in excellent position and the hardware to be also as well. The wounds were irrigated and one medium Hemovac left deep in the wound. There was minimal bleeding at that time. The fascia was closed with a  running 0 Vicryl and the subcutaneous tissue was closed with a running 2-0 Vicryl. The skin was closed with staples. A dry sterile dressing was applied and then the Hemovac was taped securely. The patient was then transferred to her hospital bed and taken to recovery in good condition. There was excellent range of motion of the hip and alignment.  ____________________________ Park Breed, MD hem:slb D: 02/17/2012 15:23:31 ET T: 02/17/2012 15:55:56 ET JOB#: [id number]  cc: Park Breed, MD, <Dictator> Park Breed MD ELECTRONICALLY SIGNED 02/18/2012 13:12

## 2014-11-13 NOTE — Consult Note (Signed)
Brief Consult Note: Diagnosis: Right intertrochanteric hip fracture.   Patient was seen by consultant.   Recommend to proceed with surgery or procedure.   Orders entered.   Comments: Patient has a right intertrochanteric hip fracture.  She will need surgical fixation for this injury. Patient is being admitted by the hospitalist and a cardiology consult is pending.  I would like to proceed with surgery tomorrow if cleared by cardiology.  Patient is NPO as of now.  I am putting ASA and lovenox on hold in preparation for surgery tomorrow.  Labs ordered.  Patient is requesting Dr. Sabra Heck do the sugery.  I will discuss the case with him in the morning.  Electronic Signatures: Thornton Park (MD)  (Signed 24-Jul-13 00:51)  Authored: Brief Consult Note   Last Updated: 24-Jul-13 00:51 by Thornton Park (MD)

## 2014-11-13 NOTE — Discharge Summary (Signed)
PATIENT NAME:  Audrey Duran, Audrey Duran MR#:  [id number] DATE OF BIRTH:  Oct 28, 1934  DATE OF ADMISSION:  02/16/2012 DATE OF DISCHARGE:  02/20/2012   FINAL DIAGNOSES:  1. Displaced intertrochanteric fracture, right hip.   2. Coronary artery disease.  3. Hypertension.  4. Hyperlipidemia.   OPERATION: 02/17/2012 ORIF right hip fracture with a Synthes compression hip nail.   COMPLICATIONS: None.   CONSULTATIONS:  1. [facility]   2. Dr. Nehemiah Massed of Cardiology    DISCHARGE MEDICATIONS:  1. Fosamax 70 mg weekly.   2. Calcium with Vitamin D twice a day. 3. Cipro 250 mg q.12 hours for 10 days. 4. Imdur 30 mg daily.  5. Zestril 10 mg daily. 6. Pantoprazole 40 mg q.a.m.  7. Pravachol 80 mg at bedtime.  8. Eyedrops twice a day.  9. Norco p.r.n. pain.  10. Enteric-coated aspirin 1 p.o. b.i.d.   11. Iron 1 p.o. daily for four weeks.   HISTORY OF PRESENT ILLNESS: The patient is a 80 year old female who fell the day of admission trying to put gas in her lawnmower. She turned around and fell and laid on the ground for about three hours before she was found. She was brought to the hospital Emergency Room where exam and x-rays revealed a displaced intertrochanteric fracture of the right hip. She had a history of significant cardiac disease and was admitted to the medical service for medical evaluation prior to surgery. She was seen in consultation by Dr. Nehemiah Massed preoperatively. She had an echo done as well which was satisfactory. She was cleared for surgery. The patient was admitted to Dr. Harden Mo service and per family request was transferred to me for surgery. Risks and benefits and postop protocol were discussed with them at length preoperatively.   PAST MEDICAL HISTORY: As above.   MEDICATIONS: As above.   ALLERGIES: None.   REVIEW OF SYSTEMS: Unremarkable.   FAMILY HISTORY: Son and father had cardiovascular disease at an early age.   SOCIAL HISTORY: The patient does not smoke or drink. She  lives at home alone.   PHYSICAL EXAMINATION: The patient is alert and cooperative. The right leg was painful to range of motion. The hip was slightly shortened and externally rotated. No other orthopedic injuries were noted. Neurovascular status was good.   LABORATORY DATA: Laboratory data on admission was satisfactory.   HOSPITAL COURSE: On 01/18/2012 she underwent open reduction internal fixation of the right hip with a Synthes compression hip nail. Postoperatively she did very well. Hemoglobin was 9.1 on the second postop day. She was beginning ambulation and was comfortable. Plan is for discharge to skilled nursing facility on 02/20/2012 if she remains stable. Bed offer has been obtained for the patient. She will return to my office in two weeks for exam and x-ray. She will be partial weightbearing on a walker.   REHABILITATION POTENTIAL: Good.   ____________________________ Park Breed, MD hem:drc D: 02/19/2012 11:10:00 ET T: 02/19/2012 11:38:38 ET JOB#: [id number]  cc: Park Breed, MD, <Dictator>, Deborra Medina, MD, Corey Skains, MD Park Breed MD ELECTRONICALLY SIGNED 02/22/2012 15:22

## 2014-11-16 NOTE — Op Note (Signed)
PATIENT NAME:  Audrey Duran, Audrey Duran MR#:  [id number] DATE OF BIRTH:  03-17-1935  DATE OF PROCEDURE:  01/30/2013  PREOPERATIVE DIAGNOSES: 1.  Uterine prolapse.  2.  Traction cystocele.   POSTOPERATIVE DIAGNOSES: 1.  Uterine prolapse. 2.  Traction cystocele.   OPERATIVE PROCEDURE: Transvaginal hysterectomy, bilateral salpingo-oophorectomy and anterior colporrhaphy.   SURGEON: Brayton Mars, MD   FIRST ASSISTANT: Herbert Moors, FNP and Shyrl Numbers, physician assistant student    ANESTHESIA: Spinal.   INDICATIONS: The patient is a 80 year old white female, menopausal, having used a Gellhorn pessary for long-term management of pelvic organ prolapse, presents for definitive surgery. The patient had a traction cystocele with uterine prolapse for which she desires surgical correction.   FINDINGS AT SURGERY: Revealed a small uterus which had moderate prolapse to the distal vagina. There was a second-to-third-degree traction cystocele. The ovaries were atrophic bilaterally.   DESCRIPTION OF PROCEDURE: The patient was brought to the operating room where she was placed in a sitting position. Spinal anesthetic was introduced without difficulty. She was placed in dorsal lithotomy position using the candy cane stirrups. A Betadine perineal intravaginal prep and drape was performed in the standard fashion. A Foley catheter was placed and was draining clear yellow urine from the bladder. A weighted speculum was placed into the vagina, and a double-tooth tenaculum was placed on the cervix. A posterior colpotomy was made with Mayo scissors. Uterosacral ligaments were clamped, cut and stick tied. The cervix was circumscribed using the Bovie cautery, and the bladder was dissected off of the lower uterine segment through sharp and blunt dissection. Eventually, the anterior cul-de-sac was entered. The cardinal broad ligament complexes were clamped, cut, and stick tied using 0 Vicryl suture. Once all the  pedicles were ligated and transected, the uterus was removed from the operative field. The adnexa were then grasped with Babcock clamps sequentially. The ovary and tube were then isolated and cross-clamped using a curved Heaney clamp. The adnexa was excised, and a free tie was followed by a stick tie for hemostasis of the infundibulopelvic ligament. A similar procedure was then carried out on the contralateral side. The posterior cuff was then run using a 0 Vicryl suture in a simple baseball stitch manner. The peritoneum was reapproximated using a pursestring stitch of 0 Vicryl. Following completion of the TVH, BSO, the anterior colporrhaphy was then performed in routine manner. Allis clamps were used to grasp the angles of the vagina that had previously been dissected open. The vaginal mucosa was undermined with Metzenbaum scissors and incised in the midline. Allis-Adair clamps were used to facilitate exposure. Both sharp and blunt dissection were used to dissect the perivesical fascia off of the vagina. Once the bladder was adequately mobilized, the cystocele was reduced using vertical mattress sutures of 2-0 Vicryl. Upon completion of the cystocele reduction, the vaginal mucosa was trimmed and the vagina was reapproximated in the midline using 2-0 chromic suture in a simple interrupted manner. Upon completion of the procedure, the vagina was packed with Premarin cream-soaked gauze.  The patient was then mobilized and taken to the recovery room in satisfactory condition.   ESTIMATED BLOOD LOSS: 100 mL.   IV FLUIDS: 1200 mL.  URINE OUTPUT: Was 400 mL.   All instruments, needle, and sponge counts were verified as correct. The patient did receive gentamicin and clindamycin preoperative antibiotic prophylaxis.   ____________________________ Audrey Slim Alston Berrie, MD mad:cb D: 01/30/2013 15:00:36 ET T: 01/30/2013 21:54:11 ET JOB#: [id number]  cc: Hassell Done A. Janille Draughon, MD, <Dictator>  Audrey Slim Krisalyn Yankowski  MD ELECTRONICALLY SIGNED 02/05/2013 23:04

## 2014-11-29 ENCOUNTER — Other Ambulatory Visit: Payer: Self-pay | Admitting: Internal Medicine

## 2014-12-06 ENCOUNTER — Ambulatory Visit: Payer: PPO | Admitting: Internal Medicine

## 2014-12-11 ENCOUNTER — Telehealth: Payer: Self-pay | Admitting: *Deleted

## 2014-12-11 NOTE — Telephone Encounter (Signed)
Left message for patient to return call to office trying to call patient for more information as to Symptoms described diarrhea and emesis since Friday.

## 2014-12-11 NOTE — Telephone Encounter (Signed)
Have her come in fora BMET and Mg level  tomorrow morning if she is concerned,  And She can drink gatorade  Or pedialyte

## 2014-12-11 NOTE — Telephone Encounter (Signed)
Tried to return call to patient for more information no answer and voicemail full.

## 2014-12-11 NOTE — Telephone Encounter (Signed)
Pt called back again.  She states she has had diarrhea twice today and vomiting once.  States this started Thursday.  Pt reports no fever.  Further states she went to work today however had to leave because of weakness. Pt states she is concerned that her Potassium is low.  Please advise appointment.

## 2014-12-11 NOTE — Telephone Encounter (Signed)
Spoke with pt, scheduled lab appoint.  Pt verbalized understanding states she has plenty of gatorade to drink.

## 2014-12-12 ENCOUNTER — Telehealth: Payer: Self-pay | Admitting: *Deleted

## 2014-12-12 ENCOUNTER — Encounter: Payer: Self-pay | Admitting: Internal Medicine

## 2014-12-12 ENCOUNTER — Ambulatory Visit (INDEPENDENT_AMBULATORY_CARE_PROVIDER_SITE_OTHER): Payer: PPO | Admitting: Internal Medicine

## 2014-12-12 ENCOUNTER — Other Ambulatory Visit (INDEPENDENT_AMBULATORY_CARE_PROVIDER_SITE_OTHER): Payer: PPO

## 2014-12-12 VITALS — BP 106/60 | HR 60 | Temp 97.7°F | Resp 14 | Ht 63.5 in | Wt 141.5 lb

## 2014-12-12 DIAGNOSIS — A0472 Enterocolitis due to Clostridium difficile, not specified as recurrent: Secondary | ICD-10-CM

## 2014-12-12 DIAGNOSIS — R112 Nausea with vomiting, unspecified: Secondary | ICD-10-CM

## 2014-12-12 DIAGNOSIS — A047 Enterocolitis due to Clostridium difficile: Secondary | ICD-10-CM

## 2014-12-12 DIAGNOSIS — A09 Infectious gastroenteritis and colitis, unspecified: Secondary | ICD-10-CM

## 2014-12-12 LAB — BASIC METABOLIC PANEL
BUN: 24 mg/dL — ABNORMAL HIGH (ref 6–23)
CHLORIDE: 103 meq/L (ref 96–112)
CO2: 26 mEq/L (ref 19–32)
Calcium: 9.9 mg/dL (ref 8.4–10.5)
Creatinine, Ser: 1.13 mg/dL (ref 0.40–1.20)
GFR: 49.56 mL/min — AB (ref >60.00)
GLUCOSE: 111 mg/dL — AB (ref 70–99)
POTASSIUM: 3.7 meq/L (ref 3.5–5.1)
Sodium: 137 mEq/L (ref 135–145)

## 2014-12-12 LAB — MAGNESIUM: Magnesium: 1.8 mg/dL (ref 1.5–2.5)

## 2014-12-12 NOTE — Progress Notes (Signed)
Subjective:  Patient ID: Audrey Duran, female    DOB: [birth year]  Age: 80 y.o. MRN: [id number]  CC:    HPI Audrey Duran presents for evaluation and treatment of persistnet nausea vomiting and diarrhea . Started 2 am after eating at [employer] for dinner.6 days ago,  Ate turnip greens ,  Black eyed peas, salad, and stewed apples.  ,none of the kids she cares for has been ill.  Syptoms have persisted for 6 days , but she has not changed her diet to clear liquids and has had post prandial emesis after eating hamburger and french fries.  Currently still having vomiting and loose stools,    No fevers, body aches, no blood or mucus in stools,    No recent antibiotics    Outpatient Prescriptions Prior to Visit  Medication Sig Dispense Refill  . acyclovir (ZOVIRAX) 400 MG tablet Take 1 tablet (400 mg total) by mouth daily. 90 tablet 1  . ALPRAZolam (XANAX) 0.5 MG tablet Take 1 tablet (0.5 mg total) by mouth at bedtime as needed for sleep. 90 tablet 1  . aspirin 81 MG tablet Take 160 mg by mouth daily.      . carbonyl iron (FEOSOL) 45 MG TABS Take one by mouth daily    . Cholecalciferol (VITAMIN D3) 3000 UNITS TABS Take 1 tablet by mouth daily. Take one by mouth daily 90 tablet 3  . cyanocobalamin 1000 MCG tablet Take one by mouth daily     . furosemide (LASIX) 20 MG tablet TAKE 1/2 TABLET (10 MG TOTAL) BY MOUTH DAILY. 135 tablet 1  . isosorbide mononitrate (IMDUR) 60 MG 24 hr tablet Take 1 tablet by mouth every day 90 tablet 0  . magnesium oxide (MAG-OX) 400 MG tablet Take 400 mg by mouth daily.      . Omega-3 Fatty Acids (FISH OIL) 1200 MG CAPS Take one by mouth daily     . pravastatin (PRAVACHOL) 80 MG tablet TAKE 1 TABLET EVERY DAY 90 tablet 1  . TIMOLOL MALEATE PF OP Use one drop both eyes 2 times a day      No facility-administered medications prior to visit.     Review of Systems   Patient denies , fevers, malaise, , skin rash, eye pain, sinus congestion and sinus  pain, sore throat, dysphagia,  hemoptysis , cough, dyspnea, wheezing, chest pain, palpitations, orthopnea, edema,  melena, diarrhea, constipation, flank pain, dysuria, hematuria, urinary  Frequency, nocturia, numbness, tingling, seizures,  Focal weakness, Loss of consciousness,  Tremor, insomnia, depression, anxiety, and suicidal ideation.      Objective:  BP 106/60 mmHg  Pulse 60  Temp(Src) 97.7 F (36.5 C) (Oral)  Resp 14  Ht 5' 3.5" (1.613 m)  Wt 141 lb 8 oz (64.184 kg)  BMI 24.67 kg/m2  SpO2 97%  BP Readings from Last 3 Encounters:  12/12/14 106/60  11/07/14 136/74  05/07/14 124/72    Wt Readings from Last 3 Encounters:  12/12/14 141 lb 8 oz (64.184 kg)  11/07/14 145 lb 8 oz (65.998 kg)  05/07/14 142 lb 12 oz (64.751 kg)    Physical Exam   General appearance: alert, cooperative and appears stated age Ears: normal TM's and external ear canals both ears Throat: lips, mucosa, and tongue normal; teeth and gums normal Neck: no adenopathy, no carotid bruit, supple, symmetrical, trachea midline and thyroid not enlarged, symmetric, no tenderness/mass/nodules Back: symmetric, no curvature. ROM normal. No CVA tenderness. Lungs: clear to auscultation  bilaterally Heart: regular rate and rhythm, S1, S2 normal, no murmur, click, rub or gallop Abdomen: soft, non-tender; bowel sounds normal; no masses,  no organomegaly Pulses: 2+ and symmetric Skin: Skin color, texture, turgor normal. No rashes or lesions Lymph nodes: Cervical, supraclavicular, and axillary nodes normal. No results found for: HGBA1C  Lab Results  Component Value Date   CREATININE 1.13 12/12/2014   CREATININE 1.03 11/07/2014   CREATININE 1.0 05/02/2014    Lab Results  Component Value Date   WBC 8.2 12/12/2014   HGB 14.4 11/07/2014   HCT 40.3 12/12/2014   PLT 212.0 11/07/2014   GLUCOSE 111* 12/12/2014   CHOL 164 11/07/2014   TRIG 64.0 11/07/2014   HDL 59.10 11/07/2014   LDLCALC 92 11/07/2014   ALT 13  12/12/2014   AST 14 12/12/2014   NA 137 12/12/2014   K 3.7 12/12/2014   CL 103 12/12/2014   CREATININE 1.13 12/12/2014   BUN 24* 12/12/2014   CO2 26 12/12/2014   TSH 2.82 11/07/2014   INR 1.0 02/16/2012    No results found.  Assessment & Plan:   Problem List Items Addressed This Visit    Colitis, Clostridium difficile    Suggested by preliminary stool antigen  test and supported by symptoms persisting for > 5 days . Oral  metronidazole prescribed  Clear liquid diet,  Contact isolation until diarrhea has resolved.        Other Visit Diagnoses    Infectious colitis, enteritis and gastroenteritis    -  Primary    Relevant Orders    Clostridium difficile EIA    Ova and parasite examination    Stool culture    CBC With Differential (Completed)    Acute Hep Panel & Hep B Surface Ab (Completed)    Hepatic function panel (Completed)       I am having Ms. Larios maintain her TIMOLOL MALEATE PF OP, aspirin, carbonyl iron, magnesium oxide, Fish Oil, cyanocobalamin, Vitamin D3, ALPRAZolam, pravastatin, acyclovir, furosemide, and isosorbide mononitrate.  No orders of the defined types were placed in this encounter.    There are no discontinued medications.  Follow-up: No Follow-up on file.   Crecencio Mc, MD

## 2014-12-12 NOTE — Progress Notes (Signed)
Pre-visit discussion using our clinic review tool. No additional management support is needed unless otherwise documented below in the visit note.  

## 2014-12-12 NOTE — Telephone Encounter (Signed)
Patient in for lab draw and requested to see MD patient stated she has had diarrhea since Friday with vomiting. Patient stated if she drinks or eats immediately has episode of Diarrhea and then starts emesis. Patient denied any pain but is feeling weak from not being able to eat or drink skin tuggor checked inside arm and patient skin tuggor poor. BP 104/56 P64 02 sat @ 95 room air T98.4. Patient BMET sent stat advised lab and patient scheduled to see MD at 4.30

## 2014-12-12 NOTE — Telephone Encounter (Signed)
Labs and dX?  

## 2014-12-12 NOTE — Patient Instructions (Signed)
Food Choices to Help Relieve Diarrhea When you have diarrhea, the foods you eat and your eating habits are very important. Choosing the right foods and drinks can help relieve diarrhea. Also, because diarrhea can last up to 7 days, you need to replace lost fluids and electrolytes (such as sodium, potassium, and chloride) in order to help prevent dehydration.  WHAT GENERAL GUIDELINES DO I NEED TO FOLLOW?  Slowly drink 1 cup (8 oz) of fluid for each episode of diarrhea. If you are getting enough fluid, your urine will be clear or pale yellow.  Eat starchy foods. Some good choices include white rice, white toast, pasta, low-fiber cereal, baked potatoes (without the skin), saltine crackers, and bagels.  Avoid large servings of any cooked vegetables.  Limit fruit to two servings per day. A serving is  cup or 1 small piece.  Choose foods with less than 2 g of fiber per serving.  Limit fats to less than 8 tsp (38 g) per day.  Avoid fried foods.  Eat foods that have probiotics in them. Probiotics can be found in certain dairy products.  Avoid foods and beverages that may increase the speed at which food moves through the stomach and intestines (gastrointestinal tract). Things to avoid include:  High-fiber foods, such as dried fruit, raw fruits and vegetables, nuts, seeds, and whole grain foods.  Spicy foods and high-fat foods.  Foods and beverages sweetened with high-fructose corn syrup, honey, or sugar alcohols such as xylitol, sorbitol, and mannitol. WHAT FOODS ARE RECOMMENDED? Grains White rice. White, French, or pita breads (fresh or toasted), including plain rolls, buns, or bagels. White pasta. Saltine, soda, or graham crackers. Pretzels. Low-fiber cereal. Cooked cereals made with water (such as cornmeal, farina, or cream cereals). Plain muffins. Matzo. Melba toast. Zwieback.  Vegetables Potatoes (without the skin). Strained tomato and vegetable juices. Most well-cooked and canned  vegetables without seeds. Tender lettuce. Fruits Cooked or canned applesauce, apricots, cherries, fruit cocktail, grapefruit, peaches, pears, or plums. Fresh bananas, apples without skin, cherries, grapes, cantaloupe, grapefruit, peaches, oranges, or plums.  Meat and Other Protein Products Baked or boiled chicken. Eggs. Tofu. Fish. Seafood. Smooth peanut butter. Ground or well-cooked tender beef, ham, veal, lamb, pork, or poultry.  Dairy Plain yogurt, kefir, and unsweetened liquid yogurt. Lactose-free milk, buttermilk, or soy milk. Plain hard cheese. Beverages Sport drinks. Clear broths. Diluted fruit juices (except prune). Regular, caffeine-free sodas such as ginger ale. Water. Decaffeinated teas. Oral rehydration solutions. Sugar-free beverages not sweetened with sugar alcohols. Other Bouillon, broth, or soups made from recommended foods.  The items listed above may not be a complete list of recommended foods or beverages. Contact your dietitian for more options. WHAT FOODS ARE NOT RECOMMENDED? Grains Whole grain, whole wheat, bran, or rye breads, rolls, pastas, crackers, and cereals. Wild or brown rice. Cereals that contain more than 2 g of fiber per serving. Corn tortillas or taco shells. Cooked or dry oatmeal. Granola. Popcorn. Vegetables Raw vegetables. Cabbage, broccoli, Brussels sprouts, artichokes, baked beans, beet greens, corn, kale, legumes, peas, sweet potatoes, and yams. Potato skins. Cooked spinach and cabbage. Fruits Dried fruit, including raisins and dates. Raw fruits. Stewed or dried prunes. Fresh apples with skin, apricots, mangoes, pears, raspberries, and strawberries.  Meat and Other Protein Products Chunky peanut butter. Nuts and seeds. Beans and lentils. Bacon.  Dairy High-fat cheeses. Milk, chocolate milk, and beverages made with milk, such as milk shakes. Cream. Ice cream. Sweets and Desserts Sweet rolls, doughnuts, and sweet breads. Pancakes   and waffles. Fats and  Oils Butter. Cream sauces. Margarine. Salad oils. Plain salad dressings. Olives. Avocados.  Beverages Caffeinated beverages (such as coffee, tea, soda, or energy drinks). Alcoholic beverages. Fruit juices with pulp. Prune juice. Soft drinks sweetened with high-fructose corn syrup or sugar alcohols. Other Coconut. Hot sauce. Chili powder. Mayonnaise. Gravy. Cream-based or milk-based soups.  The items listed above may not be a complete list of foods and beverages to avoid. Contact your dietitian for more information. WHAT SHOULD I DO IF I BECOME DEHYDRATED? Diarrhea can sometimes lead to dehydration. Signs of dehydration include dark urine and dry mouth and skin. If you think you are dehydrated, you should rehydrate with an oral rehydration solution. These solutions can be purchased at pharmacies, retail stores, or online.  Drink -1 cup (120-240 mL) of oral rehydration solution each time you have an episode of diarrhea. If drinking this amount makes your diarrhea worse, try drinking smaller amounts more often. For example, drink 1-3 tsp (5-15 mL) every 5-10 minutes.  A general rule for staying hydrated is to drink 1-2 L of fluid per day. Talk to your health care provider about the specific amount you should be drinking each day. Drink enough fluids to keep your urine clear or pale yellow. Document Released: 10/03/2003 Document Revised: 07/18/2013 Document Reviewed: 06/05/2013 [facility] Patient Information 2015 [place], [place]. This information is not intended to replace advice given to you by your health care provider. Make sure you discuss any questions you have with your health care provider. Clear Liquid Diet A clear liquid diet is a short-term diet that is prescribed to provide the necessary fluid and basic energy you need when you can have nothing else. The clear liquid diet consists of liquids or solids that will become liquid at room temperature. You should be able to see through the liquid.  There are many reasons that you may be restricted to clear liquids, such as:  When you have a sudden-onset (acute) condition that occurs before or after surgery.  To help your body slowly get adjusted to food again after a long period when you were unable to have food.  Replacement of fluids when you have a diarrheal disease.  When you are going to have certain exams, such as a colonoscopy, in which instruments are inserted inside your body to look at parts of your digestive system. WHAT CAN I HAVE? A clear liquid diet does not provide all the nutrients you need. It is important to choose a variety of the following items to get as many nutrients as possible:  Vegetable juices that do not have pulp.  Fruit juices and fruit drinks that do not have pulp.  Coffee (regular or decaffeinated), tea, or soda at the discretion of your health care provider.  Clear bouillon, broth, or strained broth-based soups.  High-protein and flavored gelatins.  Sugar or honey.  Ices or frozen ice pops that do not contain milk. If you are not sure whether you can have certain items, you should ask your health care provider. You may also ask your health care provider if there are any other clear liquid options. Document Released: 07/13/2005 Document Revised: 07/18/2013 Document Reviewed: 06/09/2013 [facility] Patient Information 2015 [place], [place]. This information is not intended to replace advice given to you by your health care provider. Make sure you discuss any questions you have with your health care provider.

## 2014-12-13 ENCOUNTER — Other Ambulatory Visit: Payer: Self-pay | Admitting: Internal Medicine

## 2014-12-13 DIAGNOSIS — A0472 Enterocolitis due to Clostridium difficile, not specified as recurrent: Secondary | ICD-10-CM

## 2014-12-13 LAB — HEPATIC FUNCTION PANEL
ALBUMIN: 3.9 g/dL (ref 3.5–5.2)
ALK PHOS: 56 U/L (ref 39–117)
ALT: 13 U/L (ref 0–35)
AST: 14 U/L (ref 0–37)
Bilirubin, Direct: 0.2 mg/dL (ref 0.0–0.3)
Total Bilirubin: 0.7 mg/dL (ref 0.2–1.2)
Total Protein: 6.9 g/dL (ref 6.0–8.3)

## 2014-12-14 DIAGNOSIS — Z8619 Personal history of other infectious and parasitic diseases: Secondary | ICD-10-CM | POA: Insufficient documentation

## 2014-12-14 LAB — ACUTE HEP PANEL AND HEP B SURFACE AB
HCV Ab: NEGATIVE
HEP B S AB: NEGATIVE
Hep A IgM: NONREACTIVE
Hep B C IgM: NONREACTIVE
Hepatitis B Surface Ag: NEGATIVE

## 2014-12-14 LAB — CBC WITH DIFFERENTIAL
BASOS: 0 %
Basophils Absolute: 0 10*3/uL (ref 0.0–0.2)
EOS (ABSOLUTE): 0.2 10*3/uL (ref 0.0–0.4)
EOS: 2 %
Hematocrit: 40.3 % (ref 34.0–46.6)
Hemoglobin: 13.5 g/dL (ref 11.1–15.9)
IMMATURE GRANS (ABS): 0 10*3/uL (ref 0.0–0.1)
IMMATURE GRANULOCYTES: 0 %
LYMPHS: 27 %
Lymphocytes Absolute: 2.2 10*3/uL (ref 0.7–3.1)
MCH: 30.4 pg (ref 26.6–33.0)
MCHC: 33.5 g/dL (ref 31.5–35.7)
MCV: 91 fL (ref 79–97)
MONOS ABS: 0.9 10*3/uL (ref 0.1–0.9)
Monocytes: 11 %
NEUTROS PCT: 60 %
Neutrophils Absolute: 4.8 10*3/uL (ref 1.4–7.0)
RBC: 4.44 x10E6/uL (ref 3.77–5.28)
RDW: 13.1 % (ref 12.3–15.4)
WBC: 8.2 10*3/uL (ref 3.4–10.8)

## 2014-12-14 LAB — OVA AND PARASITE EXAMINATION: OP: NONE SEEN

## 2014-12-14 LAB — C. DIFFICILE GDH AND TOXIN A/B
C. difficile GDH: DETECTED — AB
C. difficile Toxin A/B: NOT DETECTED

## 2014-12-14 MED ORDER — METRONIDAZOLE 500 MG PO TABS: 500.0000 mg | ORAL_TABLET | Freq: Three times a day (TID) | ORAL | Status: DC

## 2014-12-14 NOTE — Addendum Note (Signed)
Addended by: Nanci Pina on: 12/14/2014 01:42 PM   Modules accepted: Orders

## 2014-12-14 NOTE — Addendum Note (Signed)
Addended by: Crecencio Mc on: 12/14/2014 01:16 PM   Modules accepted: Orders

## 2014-12-15 ENCOUNTER — Telehealth: Payer: Self-pay | Admitting: Family Medicine

## 2014-12-15 LAB — CLOSTRIDIUM DIFFICILE BY PCR: Toxigenic C. Difficile by PCR: DETECTED — CR

## 2014-12-15 NOTE — Telephone Encounter (Signed)
Call from [facility] regarding pos c diff test  Called pt - she already has flagyl and has taken 2 doses and feels better  Urged her to contact us if any problems

## 2014-12-15 NOTE — Assessment & Plan Note (Signed)
Suggested by preliminary stool antigen  test and supported by symptoms persisting for > 5 days . Oral  metronidazole prescribed  Clear liquid diet,  Contact isolation until diarrhea has resolved.

## 2015-03-12 ENCOUNTER — Other Ambulatory Visit: Payer: Self-pay | Admitting: Internal Medicine

## 2015-03-19 ENCOUNTER — Other Ambulatory Visit: Payer: Self-pay | Admitting: *Deleted

## 2015-03-19 MED ORDER — PRAVASTATIN SODIUM 80 MG PO TABS: ORAL_TABLET | ORAL | Status: DC

## 2015-04-13 ENCOUNTER — Encounter: Payer: Self-pay | Admitting: Emergency Medicine

## 2015-04-13 ENCOUNTER — Emergency Department: Payer: PPO

## 2015-04-13 ENCOUNTER — Emergency Department
Admission: EM | Admit: 2015-04-13 | Discharge: 2015-04-13 | Disposition: A | Discharge status: Home / Self Care | Payer: PPO | Attending: Emergency Medicine | Admitting: Emergency Medicine

## 2015-04-13 DIAGNOSIS — Z87891 Personal history of nicotine dependence: Secondary | ICD-10-CM | POA: Diagnosis not present

## 2015-04-13 DIAGNOSIS — Z880 Allergy status to penicillin: Secondary | ICD-10-CM | POA: Insufficient documentation

## 2015-04-13 DIAGNOSIS — R197 Diarrhea, unspecified: Secondary | ICD-10-CM | POA: Insufficient documentation

## 2015-04-13 DIAGNOSIS — R1110 Vomiting, unspecified: Secondary | ICD-10-CM | POA: Diagnosis not present

## 2015-04-13 DIAGNOSIS — R42 Dizziness and giddiness: Secondary | ICD-10-CM | POA: Diagnosis not present

## 2015-04-13 LAB — COMPREHENSIVE METABOLIC PANEL
ALBUMIN: 4.1 g/dL (ref 3.5–5.0)
ALK PHOS: 72 U/L (ref 38–126)
ALT: 18 U/L (ref 14–54)
ANION GAP: 3 — AB (ref 5–15)
AST: 19 U/L (ref 15–41)
BUN: 16 mg/dL (ref 6–20)
CALCIUM: 10 mg/dL (ref 8.9–10.3)
CHLORIDE: 109 mmol/L (ref 101–111)
CO2: 29 mmol/L (ref 22–32)
Creatinine, Ser: 0.91 mg/dL (ref 0.44–1.00)
GFR calc Af Amer: >60 mL/min (ref >60)
GFR calc non Af Amer: 59 mL/min — ABNORMAL LOW (ref >60)
GLUCOSE: 108 mg/dL — AB (ref 65–99)
Potassium: 4.1 mmol/L (ref 3.5–5.1)
SODIUM: 141 mmol/L (ref 135–145)
Total Bilirubin: 0.7 mg/dL (ref 0.3–1.2)
Total Protein: 7.7 g/dL (ref 6.5–8.1)

## 2015-04-13 LAB — CBC WITH DIFFERENTIAL/PLATELET
BASOS PCT: 1 %
Basophils Absolute: 0.1 10*3/uL (ref 0–0.1)
EOS ABS: 0.1 10*3/uL (ref 0–0.7)
EOS PCT: 1 %
HCT: 44.3 % (ref 35.0–47.0)
HEMOGLOBIN: 14.9 g/dL (ref 12.0–16.0)
Lymphocytes Relative: 16 %
Lymphs Abs: 1.5 10*3/uL (ref 1.0–3.6)
MCH: 30.8 pg (ref 26.0–34.0)
MCHC: 33.7 g/dL (ref 32.0–36.0)
MCV: 91.4 fL (ref 80.0–100.0)
Monocytes Absolute: 0.6 10*3/uL (ref 0.2–0.9)
Monocytes Relative: 6 %
NEUTROS PCT: 76 %
Neutro Abs: 6.9 10*3/uL — ABNORMAL HIGH (ref 1.4–6.5)
PLATELETS: 204 10*3/uL (ref 150–440)
RBC: 4.84 MIL/uL (ref 3.80–5.20)
RDW: 13.5 % (ref 11.5–14.5)
WBC: 9.1 10*3/uL (ref 3.6–11.0)

## 2015-04-13 LAB — URINALYSIS COMPLETE WITH MICROSCOPIC (ARMC ONLY)
Bilirubin Urine: NEGATIVE
Glucose, UA: NEGATIVE mg/dL
HGB URINE DIPSTICK: NEGATIVE
KETONES UR: NEGATIVE mg/dL
NITRITE: NEGATIVE
PH: 7 (ref 5.0–8.0)
PROTEIN: NEGATIVE mg/dL
SPECIFIC GRAVITY, URINE: 1.006 (ref 1.005–1.030)

## 2015-04-13 LAB — TROPONIN I: Troponin I: <0.03 ng/mL (ref <0.031)

## 2015-04-13 MED ORDER — MECLIZINE HCL 25 MG PO TABS
25.0000 mg | ORAL_TABLET | Freq: Once | ORAL | Status: AC
  Administered 2015-04-13: 25 mg via ORAL
  Filled 2015-04-13: qty 1

## 2015-04-13 MED ORDER — QUETIAPINE FUMARATE 25 MG PO TABS
ORAL_TABLET | ORAL | Status: AC
  Filled 2015-04-13: qty 4

## 2015-04-13 MED ORDER — ONDANSETRON HCL 4 MG PO TABS: 4.0000 mg | ORAL_TABLET | Freq: Every day | ORAL | Status: DC | PRN

## 2015-04-13 MED ORDER — QUETIAPINE FUMARATE 200 MG PO TABS
ORAL_TABLET | ORAL | Status: AC
  Filled 2015-04-13: qty 1

## 2015-04-13 MED ORDER — MECLIZINE HCL 25 MG PO TABS: 50.0000 mg | ORAL_TABLET | Freq: Three times a day (TID) | ORAL | Status: DC | PRN

## 2015-04-13 MED ORDER — SULFAMETHOXAZOLE-TRIMETHOPRIM 800-160 MG PO TABS: 1.0000 | ORAL_TABLET | Freq: Two times a day (BID) | ORAL | Status: DC

## 2015-04-13 MED ORDER — ONDANSETRON HCL 4 MG PO TABS
4.0000 mg | ORAL_TABLET | Freq: Once | ORAL | Status: AC
  Administered 2015-04-13: 4 mg via ORAL
  Filled 2015-04-13: qty 1

## 2015-04-13 NOTE — ED Notes (Signed)
Patient c/o vertigo sxs.  Not currently dizzy while sitting.  Pt states she woke up at 3am and felt "the earth spinning so fast."  She tried to stand up and was still dizzy and then after sitting for a few minutes she got up to BR and had a BM. Pt also reports vomiting this morning as well some food and a bile.  By this morning the dizziness improved, but was still a little dizzy. Patient states she feels ok now.

## 2015-04-13 NOTE — ED Notes (Signed)
States woke at 3am, felt dizzy, went to BR for loose stool,nausea and vomiting, states still feels weak, no CP or SOB.

## 2015-04-13 NOTE — ED Provider Notes (Signed)
[facility] Emergency Department Provider Note     Time seen: ----------------------------------------- 10:18 AM on 04/13/2015 -----------------------------------------    I have reviewed the triage vital signs and the nursing notes.   HISTORY  Chief Complaint Emesis    HPI Audrey Duran is a 80 y.o. female who presents to ER for dizziness. Patient states she went to the bathroom and had loose stool as well as nausea vomiting 1. States she still feels a little weak, denies fevers chills, chest pain, shortness of breath. She states her stomach feels a little bit upset and she still has some room spinning sensation.   Past Medical History  Diagnosis Date  . Coronary artery disease 1995    s/p AMI , no history of stents,  [name]  . Myocardial infarction   . Atrial fibrillation   . Anemia   . Hyperlipidemia   . Recurrent HSV (herpes simplex virus)   . COPD (chronic obstructive pulmonary disease)   . Ischemic cardiomyopathy Dec 2011    ETT Sestamibi study apical scar, no ischemia, [name]    Patient Active Problem List   Diagnosis Date Noted  . Colitis, Clostridium difficile 12/14/2014  . Long-term use of high-risk medication 05/08/2014  . S/P TAH-BSO (total abdominal hysterectomy and bilateral salpingo-oophorectomy) 10/26/2013  . Vitamin D deficiency 10/12/2013  . Abnormal stress test 11/18/2012  . CKD (chronic kidney disease) stage 3, GFR 30-59 ml/min 10/19/2012  . Eosinophilia 09/21/2012  . Hyperkalemia 09/21/2012  . Anxiety state, unspecified 09/20/2012  . Osteopenia of the elderly 06/20/2012  . At high risk for falls 06/20/2012  . Medicare annual wellness visit, subsequent 04/25/2012  . Atrial fibrillation   . Recurrent HSV (herpes simplex virus)   . COPD (chronic obstructive pulmonary disease)   . Ischemic cardiomyopathy   . Cystocele or rectocele with complete uterine prolapse 08/02/2011  . Screening for breast cancer  07/31/2011  . Screening for cervical cancer 07/31/2011  . Screening for colon cancer 07/31/2011  . Coronary artery disease   . History of myocardial infarction     Past Surgical History  Procedure Laterality Date  . Cataract extraction  2004, 2006  . Carotid endarterectomy  July 2007    Dew, right carotid  . Joint replacement  July 2013    total hip , [place] Heck  . Abdominal hysterectomy  june 2014    Allergies Azithromycin; Clarithromycin; Diclofenac; Lisinopril; Meloxicam; Nitrofurantoin monohyd macro; Zocor; and Penicillins  Social History Social History  Substance Use Topics  . Smoking status: Former Smoker    Quit date: 01/13/1992  . Smokeless tobacco: Never Used  . Alcohol Use: No    Review of Systems Constitutional: Negative for fever. Eyes: Negative for visual changes. ENT: Negative for sore throat. Cardiovascular: Negative for chest pain. Respiratory: Negative for shortness of breath. Gastrointestinal: Negative for abdominal pain, positive for vomiting and diarrhea 1 Genitourinary: Negative for dysuria. Musculoskeletal: Negative for back pain. Skin: Negative for rash. Neurological: Negative for headaches, focal weakness or numbness. Positive for vertigo  10-point ROS otherwise negative.  ____________________________________________   PHYSICAL EXAM:  VITAL SIGNS: ED Triage Vitals  Enc Vitals Group     BP 04/13/15 1005 145/108 mmHg     Pulse Rate 04/13/15 1005 57     Resp 04/13/15 1005 18     Temp 04/13/15 1005 97.6 F (36.4 C)     Temp Source 04/13/15 1005 Oral     SpO2 04/13/15 1005 98 %     Weight 04/13/15  1005 141 lb (63.957 kg)     Height 04/13/15 1005 5\' 3"  (1.6 m)     Head Cir --      Peak Flow --      Pain Score --      Pain Loc --      Pain Edu? --      Excl. in [place]? --     Constitutional: Alert and oriented. Well appearing and in no distress. Eyes: Conjunctivae are normal. PERRL. Normal extraocular movements. ENT   Head:  Normocephalic and atraumatic.   Nose: No congestion/rhinnorhea.   Mouth/Throat: Mucous membranes are moist.   Neck: No stridor. Cardiovascular: Normal rate, regular rhythm. Normal and symmetric distal pulses are present in all extremities. No murmurs, rubs, or gallops. Respiratory: Normal respiratory effort without tachypnea nor retractions. Breath sounds are clear and equal bilaterally. No wheezes/rales/rhonchi. Gastrointestinal: Soft and nontender. No distention. No abdominal bruits.  Musculoskeletal: Nontender with normal range of motion in all extremities. No joint effusions.  No lower extremity tenderness nor edema. Neurologic:  Normal speech and language. No gross focal neurologic deficits are appreciated. Speech is normal. No gait instability. Skin:  Skin is warm, dry and intact. No rash noted. Psychiatric: Mood and affect are normal. Speech and behavior are normal. Patient exhibits appropriate insight and judgment. ____________________________________________  EKG: Interpreted by me. Sinus bradycardia with rate of 54 bpm, normal PR interval, normal QRS with, normal QT interval.  ____________________________________________  ED COURSE:  Pertinent labs & imaging results that were available during my care of the patient were reviewed by me and considered in my medical decision making (see chart for details). Patient is in no acute distress, check basic labs, give oral meclizine and Zofran IMPRESSION: 1. No acute intracranial pathology. 2. Chronic microvascular disease and cerebral atrophy. ____________________________________________    LABS (pertinent positives/negatives)  Labs Reviewed  CBC WITH DIFFERENTIAL/PLATELET - Abnormal; Notable for the following:    Neutro Abs 6.9 (*)    All other components within normal limits  COMPREHENSIVE METABOLIC PANEL - Abnormal; Notable for the following:    Glucose, Bld 108 (*)    GFR calc non Af Amer 59 (*)    Anion gap 3 (*)     All other components within normal limits  URINALYSIS COMPLETEWITH MICROSCOPIC ([facility] ONLY) - Abnormal; Notable for the following:    Color, Urine YELLOW (*)    APPearance HAZY (*)    Leukocytes, UA 2+ (*)    Bacteria, UA RARE (*)    Squamous Epithelial / LPF 6-30 (*)    All other components within normal limits  TROPONIN I    ____________________________________________  FINAL ASSESSMENT AND PLAN  Vertigo  Plan: Patient with labs and imaging as dictated above. Patient likely with peripheral vertigo. Workup to this point has been unremarkable. Should be on antibiotics for several days for UTI. She discharged with meclizine and Zofran.   Earleen Newport, MD   Earleen Newport, MD 04/13/15 [phone]

## 2015-04-13 NOTE — Discharge Instructions (Signed)

## 2015-04-16 ENCOUNTER — Ambulatory Visit (INDEPENDENT_AMBULATORY_CARE_PROVIDER_SITE_OTHER): Payer: PPO | Admitting: Nurse Practitioner

## 2015-04-16 VITALS — BP 100/60 | HR 52 | Temp 97.7°F | Resp 14 | Ht 65.0 in | Wt 143.4 lb

## 2015-04-16 DIAGNOSIS — Z09 Encounter for follow-up examination after completed treatment for conditions other than malignant neoplasm: Secondary | ICD-10-CM | POA: Diagnosis not present

## 2015-04-16 NOTE — Progress Notes (Signed)
Patient ID: Audrey Duran, female    DOB: 05/10/1935  Age: 80 y.o. MRN: [id number]  CC: ER Follow Up   HPI Audrey Duran presents for ED follow up for vertigo.   1) Vertigo- Saturday really dizzy, couldn't get out of bed for awhile, finally got up and had a loose stool and vomiting.  Went to Walk-in clinic  EKG NSR PO Meclizine and Zofran   UTI- Bactrim course completed   Not using or have needed the Meclizine   History [name] has a past medical history of Coronary artery disease (1995); Myocardial infarction; Atrial fibrillation; Anemia; Hyperlipidemia; Recurrent HSV (herpes simplex virus); COPD (chronic obstructive pulmonary disease); and Ischemic cardiomyopathy (Dec 2011).   She has past surgical history that includes Cataract extraction (2004, 2006); Carotid endarterectomy (July 2007); Joint replacement (July 2013); and Abdominal hysterectomy (june 2014).   Her family history includes Cancer (age of onset: 18) in her sister; Cancer (age of onset: 42) in her mother; Heart disease in her father.She reports that she quit smoking about 23 years ago. She has never used smokeless tobacco. She reports that she does not drink alcohol or use illicit drugs.  Outpatient Prescriptions Prior to Visit  Medication Sig Dispense Refill  . acyclovir (ZOVIRAX) 400 MG tablet Take 1 tablet (400 mg total) by mouth daily. 90 tablet 1  . ALPRAZolam (XANAX) 0.5 MG tablet Take 1 tablet (0.5 mg total) by mouth at bedtime as needed for sleep. 90 tablet 1  . aspirin 81 MG tablet Take 160 mg by mouth daily.      . carbonyl iron (FEOSOL) 45 MG TABS Take one by mouth daily    . Cholecalciferol (VITAMIN D3) 3000 UNITS TABS Take 1 tablet by mouth daily. Take one by mouth daily 90 tablet 3  . cyanocobalamin 1000 MCG tablet Take one by mouth daily     . furosemide (LASIX) 20 MG tablet TAKE 1/2 TABLET (10 MG TOTAL) BY MOUTH DAILY. 135 tablet 1  . isosorbide mononitrate (IMDUR) 60 MG 24 hr tablet Take 1  tablet by mouth every day 90 tablet 0  . magnesium oxide (MAG-OX) 400 MG tablet Take 400 mg by mouth daily.      . meclizine (ANTIVERT) 25 MG tablet Take 2 tablets (50 mg total) by mouth 3 (three) times daily as needed for dizziness or nausea. 30 tablet 1  . metroNIDAZOLE (FLAGYL) 500 MG tablet Take 1 tablet (500 mg total) by mouth 3 (three) times daily. 21 tablet 0  . Omega-3 Fatty Acids (FISH OIL) 1200 MG CAPS Take one by mouth daily     . ondansetron (ZOFRAN) 4 MG tablet Take 1 tablet (4 mg total) by mouth daily as needed for nausea or vomiting. 20 tablet 1  . pravastatin (PRAVACHOL) 80 MG tablet TAKE 1 TABLET EVERY DAY 90 tablet 1  . sulfamethoxazole-trimethoprim (BACTRIM DS) 800-160 MG per tablet Take 1 tablet by mouth 2 (two) times daily. 6 tablet 0  . TIMOLOL MALEATE PF OP Use one drop both eyes 2 times a day      No facility-administered medications prior to visit.    ROS Review of Systems  Constitutional: Negative for fever, chills, diaphoresis and fatigue.  Respiratory: Negative for chest tightness, shortness of breath and wheezing.   Cardiovascular: Negative for chest pain, palpitations and leg swelling.  Gastrointestinal: Negative for nausea, vomiting and diarrhea.  Skin: Negative for rash.  Neurological: Positive for dizziness. Negative for weakness, numbness and headaches.  Psychiatric/Behavioral: The  patient is not nervous/anxious.     Objective:  BP 100/60 mmHg  Pulse 52  Temp(Src) 97.7 F (36.5 C)  Resp 14  Ht 5\' 5"  (1.651 m)  Wt 143 lb 6.4 oz (65.046 kg)  BMI 23.86 kg/m2  SpO2 96%  Physical Exam  Constitutional: She is oriented to person, place, and time. She appears well-developed and well-nourished. No distress.  HENT:  Head: Normocephalic and atraumatic.  Right Ear: External ear normal.  Left Ear: External ear normal.  Eyes:  Negative for nystagmus  Cardiovascular: Normal rate, regular rhythm, normal heart sounds and intact distal pulses.  Exam reveals  no gallop and no friction rub.   No murmur heard. Pulmonary/Chest: Effort normal and breath sounds normal. No respiratory distress. She has no wheezes. She has no rales. She exhibits no tenderness.  Neurological: She is alert and oriented to person, place, and time. No cranial nerve deficit. She exhibits normal muscle tone. Coordination normal.  Skin: Skin is warm and dry. No rash noted. She is not diaphoretic.  Psychiatric: She has a normal mood and affect. Her behavior is normal. Judgment and thought content normal.   Assessment & Plan:   Keirsten was seen today for er follow up.  Diagnoses and all orders for this visit:  Hospital discharge follow-up   I am having [name] maintain her TIMOLOL MALEATE PF OP, aspirin, carbonyl iron, magnesium oxide, Fish Oil, cyanocobalamin, Vitamin D3, ALPRAZolam, acyclovir, furosemide, metroNIDAZOLE, isosorbide mononitrate, pravastatin, meclizine, ondansetron, and sulfamethoxazole-trimethoprim.  No orders of the defined types were placed in this encounter.     Follow-up: Return if symptoms worsen or fail to improve.

## 2015-04-16 NOTE — Patient Instructions (Addendum)
For uti prevention- good water intake! If using cranberry use no added sugar (not cocktail- too much sugar).   Follow up with Dr. Derrel Nip if needed.

## 2015-04-19 ENCOUNTER — Encounter: Payer: Self-pay | Admitting: Nurse Practitioner

## 2015-04-19 DIAGNOSIS — Z09 Encounter for follow-up examination after completed treatment for conditions other than malignant neoplasm: Secondary | ICD-10-CM | POA: Insufficient documentation

## 2015-04-19 NOTE — Assessment & Plan Note (Signed)
ER visit for vertigo. Meclizine given, but not used. Pt completed treatment for UTI. FU prn worsening/failure to improve.

## 2015-05-23 ENCOUNTER — Other Ambulatory Visit: Payer: Self-pay

## 2015-05-23 DIAGNOSIS — B009 Herpesviral infection, unspecified: Secondary | ICD-10-CM

## 2015-05-23 MED ORDER — ACYCLOVIR 400 MG PO TABS: 400.0000 mg | ORAL_TABLET | Freq: Every day | ORAL | Status: DC

## 2015-05-23 MED ORDER — ISOSORBIDE MONONITRATE ER 60 MG PO TB24: 60.0000 mg | ORAL_TABLET | Freq: Every day | ORAL | Status: DC

## 2015-05-30 LAB — HM PAP SMEAR: HM PAP: NEGATIVE

## 2015-07-09 ENCOUNTER — Encounter: Payer: Self-pay | Admitting: Internal Medicine

## 2015-08-13 DIAGNOSIS — H8111 Benign paroxysmal vertigo, right ear: Secondary | ICD-10-CM | POA: Diagnosis not present

## 2015-08-27 DIAGNOSIS — I5022 Chronic systolic (congestive) heart failure: Secondary | ICD-10-CM | POA: Diagnosis not present

## 2015-08-27 DIAGNOSIS — J410 Simple chronic bronchitis: Secondary | ICD-10-CM | POA: Diagnosis not present

## 2015-08-27 DIAGNOSIS — Z9889 Other specified postprocedural states: Secondary | ICD-10-CM | POA: Diagnosis not present

## 2015-08-27 DIAGNOSIS — I214 Non-ST elevation (NSTEMI) myocardial infarction: Secondary | ICD-10-CM | POA: Diagnosis not present

## 2015-08-27 DIAGNOSIS — I255 Ischemic cardiomyopathy: Secondary | ICD-10-CM | POA: Diagnosis not present

## 2015-09-19 ENCOUNTER — Other Ambulatory Visit: Payer: Self-pay | Admitting: *Deleted

## 2015-09-19 DIAGNOSIS — B009 Herpesviral infection, unspecified: Secondary | ICD-10-CM

## 2015-09-19 MED ORDER — ACYCLOVIR 400 MG PO TABS: 400.0000 mg | ORAL_TABLET | Freq: Every day | ORAL | Status: DC

## 2015-09-19 MED ORDER — ISOSORBIDE MONONITRATE ER 60 MG PO TB24: 60.0000 mg | ORAL_TABLET | Freq: Every day | ORAL | Status: DC

## 2015-09-19 MED ORDER — FUROSEMIDE 20 MG PO TABS: ORAL_TABLET | ORAL | Status: DC

## 2015-09-19 NOTE — Telephone Encounter (Signed)
Pt has not had recent bloodwork, is this refill napropriate until you see her for an OV? Please advise, thanks

## 2015-09-19 NOTE — Telephone Encounter (Signed)
Medication refill for acyclovir,isosorbide, pravastatin and furosemide [facility]

## 2015-09-20 MED ORDER — PRAVASTATIN SODIUM 80 MG PO TABS: ORAL_TABLET | ORAL | Status: DC

## 2015-09-20 NOTE — Telephone Encounter (Signed)
Refilled pravastatin for 30 days only.  OFFICE VISIT and labs NEEDED prior to any more refills

## 2015-11-08 ENCOUNTER — Encounter: Payer: PPO | Admitting: Internal Medicine

## 2015-11-11 ENCOUNTER — Telehealth: Payer: Self-pay | Admitting: Internal Medicine

## 2015-11-11 ENCOUNTER — Other Ambulatory Visit: Payer: Self-pay | Admitting: Internal Medicine

## 2015-11-11 NOTE — Telephone Encounter (Signed)
These are both pending approval from Provider.  Thanks

## 2015-11-11 NOTE — Telephone Encounter (Signed)
pravastatin (PRAVACHOL) 80 MG tablet  isosorbide mononitrate (IMDUR) 60 MG 24 hr tablet

## 2015-11-11 NOTE — Telephone Encounter (Signed)
Refilled 09/19/15. No recent lab work. Please advise?

## 2015-11-12 NOTE — Telephone Encounter (Signed)
No labs needed,  OV needed for annual wellness and follow up ,  45 minutes,.  In late May oK

## 2015-11-14 ENCOUNTER — Ambulatory Visit (INDEPENDENT_AMBULATORY_CARE_PROVIDER_SITE_OTHER): Payer: PPO | Admitting: Internal Medicine

## 2015-11-14 ENCOUNTER — Encounter: Payer: Self-pay | Admitting: Internal Medicine

## 2015-11-14 VITALS — BP 120/76 | HR 54 | Temp 97.6°F | Resp 12 | Ht 65.0 in | Wt 151.0 lb

## 2015-11-14 DIAGNOSIS — Z1239 Encounter for other screening for malignant neoplasm of breast: Secondary | ICD-10-CM | POA: Diagnosis not present

## 2015-11-14 DIAGNOSIS — E559 Vitamin D deficiency, unspecified: Secondary | ICD-10-CM | POA: Diagnosis not present

## 2015-11-14 DIAGNOSIS — Z23 Encounter for immunization: Secondary | ICD-10-CM

## 2015-11-14 DIAGNOSIS — Z0000 Encounter for general adult medical examination without abnormal findings: Secondary | ICD-10-CM

## 2015-11-14 DIAGNOSIS — Z87898 Personal history of other specified conditions: Secondary | ICD-10-CM

## 2015-11-14 DIAGNOSIS — Z8669 Personal history of other diseases of the nervous system and sense organs: Secondary | ICD-10-CM

## 2015-11-14 DIAGNOSIS — E785 Hyperlipidemia, unspecified: Secondary | ICD-10-CM | POA: Diagnosis not present

## 2015-11-14 DIAGNOSIS — R300 Dysuria: Secondary | ICD-10-CM | POA: Diagnosis not present

## 2015-11-14 DIAGNOSIS — Z7289 Other problems related to lifestyle: Secondary | ICD-10-CM | POA: Diagnosis not present

## 2015-11-14 DIAGNOSIS — R5383 Other fatigue: Secondary | ICD-10-CM | POA: Diagnosis not present

## 2015-11-14 LAB — COMPREHENSIVE METABOLIC PANEL
ALBUMIN: 3.9 g/dL (ref 3.5–5.2)
ALK PHOS: 70 U/L (ref 39–117)
ALT: 18 U/L (ref 0–35)
AST: 16 U/L (ref 0–37)
BUN: 13 mg/dL (ref 6–23)
CALCIUM: 9.8 mg/dL (ref 8.4–10.5)
CHLORIDE: 107 meq/L (ref 96–112)
CO2: 27 mEq/L (ref 19–32)
CREATININE: 0.9 mg/dL (ref 0.40–1.20)
GFR: 64.3 mL/min (ref >60.00)
Glucose, Bld: 97 mg/dL (ref 70–99)
POTASSIUM: 4.4 meq/L (ref 3.5–5.1)
SODIUM: 140 meq/L (ref 135–145)
TOTAL PROTEIN: 6.6 g/dL (ref 6.0–8.3)
Total Bilirubin: 0.5 mg/dL (ref 0.2–1.2)

## 2015-11-14 LAB — VITAMIN D 25 HYDROXY (VIT D DEFICIENCY, FRACTURES): VITD: 43.57 ng/mL (ref 30.00–100.00)

## 2015-11-14 LAB — LIPID PANEL
CHOLESTEROL: 156 mg/dL (ref 0–200)
HDL: 54.9 mg/dL (ref >39.00)
LDL Cholesterol: 89 mg/dL (ref 0–99)
NonHDL: 101.49
TRIGLYCERIDES: 62 mg/dL (ref 0.0–149.0)
Total CHOL/HDL Ratio: 3
VLDL: 12.4 mg/dL (ref 0.0–40.0)

## 2015-11-14 LAB — CBC WITH DIFFERENTIAL/PLATELET
BASOS ABS: 0 10*3/uL (ref 0.0–0.1)
Basophils Relative: 0.3 % (ref 0.0–3.0)
EOS ABS: 0.3 10*3/uL (ref 0.0–0.7)
Eosinophils Relative: 2.9 % (ref 0.0–5.0)
HCT: 41.2 % (ref 36.0–46.0)
Hemoglobin: 13.8 g/dL (ref 12.0–15.0)
LYMPHS ABS: 2.3 10*3/uL (ref 0.7–4.0)
Lymphocytes Relative: 25.9 % (ref 12.0–46.0)
MCHC: 33.5 g/dL (ref 30.0–36.0)
MCV: 90.4 fl (ref 78.0–100.0)
Monocytes Absolute: 0.6 10*3/uL (ref 0.1–1.0)
Monocytes Relative: 7.1 % (ref 3.0–12.0)
NEUTROS ABS: 5.6 10*3/uL (ref 1.4–7.7)
NEUTROS PCT: 63.8 % (ref 43.0–77.0)
PLATELETS: 203 10*3/uL (ref 150.0–400.0)
RBC: 4.56 Mil/uL (ref 3.87–5.11)
RDW: 13.9 % (ref 11.5–15.5)
WBC: 8.7 10*3/uL (ref 4.0–10.5)

## 2015-11-14 LAB — TSH: TSH: 2.33 u[IU]/mL (ref 0.35–4.50)

## 2015-11-14 NOTE — Patient Instructions (Signed)
Vertigo Vertigo means that you feel like you are moving when you are not. Vertigo can also make you feel like things around you are moving when they are not. This feeling can come and go at any time. Vertigo often goes away on its own. HOME CARE  Avoid making fast movements.  Avoid driving.  Avoid using heavy machinery.  Avoid doing any task or activity that might cause danger to you or other people if you would have a vertigo attack while you are doing it.  Sit down right away if you feel dizzy or have trouble with your balance.  Take over-the-counter and prescription medicines only as told by your doctor.  Follow instructions from your doctor about which positions or movements you should avoid.  Drink enough fluid to keep your pee (urine) clear or pale yellow.  Keep all follow-up visits as told by your doctor. This is important. GET HELP IF:  Medicine does not help your vertigo.  You have a fever.  Your problems get worse or you have new symptoms.  Your family or friends see changes in your behavior.  You feel sick to your stomach (nauseous) or you throw up (vomit).  You have a "pins and needles" feeling or you are numb in part of your body. GET HELP RIGHT AWAY IF:  You have trouble moving or talking.  You are always dizzy.  You pass out (faint).  You get very bad headaches.  You feel weak or have trouble using your hands, arms, or legs.  You have changes in your hearing.  You have changes in your seeing (vision).  You get a stiff neck.  Bright light starts to bother you.   This information is not intended to replace advice given to you by your health care provider. Make sure you discuss any questions you have with your health care provider.   Document Released: 04/21/2008 Document Revised: 04/03/2015 Document Reviewed: 11/05/2014 Elsevier Interactive Patient Education [employer].  Menopause is a normal process in which your reproductive ability  comes to an end. This process happens gradually over a span of months to years, usually between the ages of 30 and 37. Menopause is complete when you have missed 12 consecutive menstrual periods. It is important to talk with your health care provider about some of the most common conditions that affect postmenopausal women, such as heart disease, cancer, and bone loss (osteoporosis). Adopting a healthy lifestyle and getting preventive care can help to promote your health and wellness. Those actions can also lower your chances of developing some of these common conditions. WHAT SHOULD I KNOW ABOUT MENOPAUSE? During menopause, you may experience a number of symptoms, such as:  Moderate-to-severe hot flashes.  Night sweats.  Decrease in sex drive.  Mood swings.  Headaches.  Tiredness.  Irritability.  Memory problems.  Insomnia. Choosing to treat or not to treat menopausal changes is an individual decision that you make with your health care provider. WHAT SHOULD I KNOW ABOUT HORMONE REPLACEMENT THERAPY AND SUPPLEMENTS? Hormone therapy products are effective for treating symptoms that are associated with menopause, such as hot flashes and night sweats. Hormone replacement carries certain risks, especially as you become older. If you are thinking about using estrogen or estrogen with progestin treatments, discuss the benefits and risks with your health care provider. WHAT SHOULD I KNOW ABOUT HEART DISEASE AND STROKE? Heart disease, heart attack, and stroke become more likely as you age. This may be due, in part, to the  hormonal changes that your body experiences during menopause. These can affect how your body processes dietary fats, triglycerides, and cholesterol. Heart attack and stroke are both medical emergencies. There are many things that you can do to help prevent heart disease and stroke:  Have your blood pressure checked at least every 1-2 years. High blood pressure causes heart  disease and increases the risk of stroke.  If you are 64-53 years old, ask your health care provider if you should take aspirin to prevent a heart attack or a stroke.  Do not use any tobacco products, including cigarettes, chewing tobacco, or electronic cigarettes. If you need help quitting, ask your health care provider.  It is important to eat a healthy diet and maintain a healthy weight.  Be sure to include plenty of vegetables, fruits, low-fat dairy products, and lean protein.  Avoid eating foods that are high in solid fats, added sugars, or salt (sodium).  Get regular exercise. This is one of the most important things that you can do for your health.  Try to exercise for at least 150 minutes each week. The type of exercise that you do should increase your heart rate and make you sweat. This is known as moderate-intensity exercise.  Try to do strengthening exercises at least twice each week. Do these in addition to the moderate-intensity exercise.  Know your numbers.Ask your health care provider to check your cholesterol and your blood glucose. Continue to have your blood tested as directed by your health care provider. WHAT SHOULD I KNOW ABOUT CANCER SCREENING? There are several types of cancer. Take the following steps to reduce your risk and to catch any cancer development as early as possible. Breast Cancer  Practice breast self-awareness.  This means understanding how your breasts normally appear and feel.  It also means doing regular breast self-exams. Let your health care provider know about any changes, no matter how small.  If you are 64 or older, have a clinician do a breast exam (clinical breast exam or CBE) every year. Depending on your age, family history, and medical history, it may be recommended that you also have a yearly breast X-ray (mammogram).  If you have a family history of breast cancer, talk with your health care provider about genetic screening.  If you  are at high risk for breast cancer, talk with your health care provider about having an MRI and a mammogram every year.  Breast cancer (BRCA) gene test is recommended for women who have family members with BRCA-related cancers. Results of the assessment will determine the need for genetic counseling and BRCA1 and for BRCA2 testing. BRCA-related cancers include these types:  Breast. This occurs in males or females.  Ovarian.  Tubal. This may also be called fallopian tube cancer.  Cancer of the abdominal or pelvic lining (peritoneal cancer).  Prostate.  Pancreatic. Cervical, Uterine, and Ovarian Cancer Your health care provider may recommend that you be screened regularly for cancer of the pelvic organs. These include your ovaries, uterus, and vagina. This screening involves a pelvic exam, which includes checking for microscopic changes to the surface of your cervix (Pap test).  For women ages 21-65, health care providers may recommend a pelvic exam and a Pap test every three years. For women ages 38-65, they may recommend the Pap test and pelvic exam, combined with testing for human papilloma virus (HPV), every five years. Some types of HPV increase your risk of cervical cancer. Testing for HPV may also be done  on women of any age who have unclear Pap test results.  Other health care providers may not recommend any screening for nonpregnant women who are considered low risk for pelvic cancer and have no symptoms. Ask your health care provider if a screening pelvic exam is right for you.  If you have had past treatment for cervical cancer or a condition that could lead to cancer, you need Pap tests and screening for cancer for at least 20 years after your treatment. If Pap tests have been discontinued for you, your risk factors (such as having a new sexual partner) need to be reassessed to determine if you should start having screenings again. Some women have medical problems that increase the  chance of getting cervical cancer. In these cases, your health care provider may recommend that you have screening and Pap tests more often.  If you have a family history of uterine cancer or ovarian cancer, talk with your health care provider about genetic screening.  If you have vaginal bleeding after reaching menopause, tell your health care provider.  There are currently no reliable tests available to screen for ovarian cancer. Lung Cancer Lung cancer screening is recommended for adults 78-51 years old who are at high risk for lung cancer because of a history of smoking. A yearly low-dose CT scan of the lungs is recommended if you:  Currently smoke.  Have a history of at least 30 pack-years of smoking and you currently smoke or have quit within the past 15 years. A pack-year is smoking an average of one pack of cigarettes per day for one year. Yearly screening should:  Continue until it has been 15 years since you quit.  Stop if you develop a health problem that would prevent you from having lung cancer treatment. Colorectal Cancer  This type of cancer can be detected and can often be prevented.  Routine colorectal cancer screening usually begins at age 73 and continues through age 46.  If you have risk factors for colon cancer, your health care provider may recommend that you be screened at an earlier age.  If you have a family history of colorectal cancer, talk with your health care provider about genetic screening.  Your health care provider may also recommend using home test kits to check for hidden blood in your stool.  A small camera at the end of a tube can be used to examine your colon directly (sigmoidoscopy or colonoscopy). This is done to check for the earliest forms of colorectal cancer.  Direct examination of the colon should be repeated every 5-10 years until age 7. However, if early forms of precancerous polyps or small growths are found or if you have a family  history or genetic risk for colorectal cancer, you may need to be screened more often. Skin Cancer  Check your skin from head to toe regularly.  Monitor any moles. Be sure to tell your health care provider:  About any new moles or changes in moles, especially if there is a change in a mole's shape or color.  If you have a mole that is larger than the size of a pencil eraser.  If any of your family members has a history of skin cancer, especially at a young age, talk with your health care provider about genetic screening.  Always use sunscreen. Apply sunscreen liberally and repeatedly throughout the day.  Whenever you are outside, protect yourself by wearing long sleeves, pants, a wide-brimmed hat, and sunglasses. WHAT SHOULD I  KNOW ABOUT OSTEOPOROSIS? Osteoporosis is a condition in which bone destruction happens more quickly than new bone creation. After menopause, you may be at an increased risk for osteoporosis. To help prevent osteoporosis or the bone fractures that can happen because of osteoporosis, the following is recommended:  If you are 61-69 years old, get at least 1,000 mg of calcium and at least 600 mg of vitamin D per day.  If you are older than age 67 but younger than age 19, get at least 1,200 mg of calcium and at least 600 mg of vitamin D per day.  If you are older than age 65, get at least 1,200 mg of calcium and at least 800 mg of vitamin D per day. Smoking and excessive alcohol intake increase the risk of osteoporosis. Eat foods that are rich in calcium and vitamin D, and do weight-bearing exercises several times each week as directed by your health care provider. WHAT SHOULD I KNOW ABOUT HOW MENOPAUSE AFFECTS [place]? Depression may occur at any age, but it is more common as you become older. Common symptoms of depression include:  Low or sad mood.  Changes in sleep patterns.  Changes in appetite or eating patterns.  Feeling an overall lack of motivation  or enjoyment of activities that you previously enjoyed.  Frequent crying spells. Talk with your health care provider if you think that you are experiencing depression. WHAT SHOULD I KNOW ABOUT IMMUNIZATIONS? It is important that you get and maintain your immunizations. These include:  Tetanus, diphtheria, and pertussis (Tdap) booster vaccine.  Influenza every year before the flu season begins.  Pneumonia vaccine.  Shingles vaccine. Your health care provider may also recommend other immunizations.   This information is not intended to replace advice given to you by your health care provider. Make sure you discuss any questions you have with your health care provider.   Document Released: 09/04/2005 Document Revised: 08/03/2014 Document Reviewed: 03/15/2014 Elsevier Interactive Patient Education [employer].

## 2015-11-14 NOTE — Progress Notes (Signed)
Patient ID: Audrey Duran, female    DOB: 15-Apr-1935  Age: 81 y.o. MRN: [id number]  The patient is here for annual Medicare wellness examination and management of other chronic and acute problems.  last seen by me in May .  Seen by Morey Hummingbird in September for ER follow up for vertigo and UTI.  Lasted a week,  Then Dr Pryor Ochoa saw her and resolved the vertigo with the Dixon Hallpike maneuver    Mammogram due osteopenia by 2014 DEXA history of heel fracture 5 years ago from a fall   Colonoscopy 2006 ,   Annual exam by [place], for glaucoma and macular degeneration both eyes.  Some recent changes in vision   The risk factors are reflected in the social history.  The roster of all physicians providing medical care to patient - is listed in the Snapshot section of the chart.  Activities of daily living:  The patient is 100% independent in all ADLs: dressing, toileting, feeding as well as independent mobility  Home safety : The patient has smoke detectors in the home. They wear seatbelts.  There are no firearms at home. There is no violence in the home.   There is no risks for hepatitis, STDs or HIV. There is no   history of blood transfusion. They have no travel history to infectious disease endemic areas of the world.  The patient has seen their dentist in the last six month. They have seen their eye doctor in the last year. They admit to slight hearing difficulty with regard to whispered voices and some television programs.  They have deferred audiologic testing in the last year.  They do not  have excessive sun exposure. Discussed the need for sun protection: hats, long sleeves and use of sunscreen if there is significant sun exposure.   Diet: the importance of a healthy diet is discussed. They do have a healthy diet.  The benefits of regular aerobic exercise were discussed. She walks 4 times per week ,  20 minutes.   Depression screen: there are no signs or vegative symptoms of depression-  irritability, change in appetite, anhedonia, sadness/tearfullness.  Cognitive assessment: the patient manages all their financial and personal affairs and is actively engaged. They could relate day,date,year and events; recalled 2/3 objects at 3 minutes; performed clock-face test normally.  The following portions of the patient's history were reviewed and updated as appropriate: allergies, current medications, past family history, past medical history,  past surgical history, past social history  and problem list.  Visual acuity was not assessed per patient preference since she has regular follow up with her ophthalmologist. Hearing and body mass index were assessed and reviewed.   During the course of the visit the patient was educated and counseled about appropriate screening and preventive services including : fall prevention , diabetes screening, nutrition counseling, colorectal cancer screening, and recommended immunizations.    CC: The primary encounter diagnosis was Other problems related to lifestyle. Diagnoses of Dysuria, Breast cancer screening, Vitamin D deficiency, Hyperlipidemia, Other fatigue, Need for prophylactic vaccination against Streptococcus pneumoniae (pneumococcus), Medicare annual wellness visit, subsequent, and History of vertigo were also pertinent to this visit.  Some dysuria started this morning.   History [place] has a past medical history of Coronary artery disease (1995); Myocardial infarction ([place]); Atrial fibrillation ([place]); Anemia; Hyperlipidemia; Recurrent HSV (herpes simplex virus); COPD (chronic obstructive pulmonary disease) ([place]); and Ischemic cardiomyopathy (Dec 2011).   She has past surgical history that includes Cataract extraction (2004, 2006);  Carotid endarterectomy (July 2007); Joint replacement (July 2013); and Abdominal hysterectomy (june 2014).   Her family history includes Cancer (age of onset: 72) in her sister; Cancer (age of onset: 69) in her  mother; Heart disease in her father.She reports that she quit smoking about 23 years ago. She has never used smokeless tobacco. She reports that she does not drink alcohol or use illicit drugs.  Outpatient Prescriptions Prior to Visit  Medication Sig Dispense Refill  . acyclovir (ZOVIRAX) 400 MG tablet Take 1 tablet (400 mg total) by mouth daily. 90 tablet 1  . ALPRAZolam (XANAX) 0.5 MG tablet Take 1 tablet (0.5 mg total) by mouth at bedtime as needed for sleep. 90 tablet 1  . aspirin 81 MG tablet Take 160 mg by mouth daily.      . carbonyl iron (FEOSOL) 45 MG TABS Take one by mouth daily    . Cholecalciferol (VITAMIN D3) 3000 UNITS TABS Take 1 tablet by mouth daily. Take one by mouth daily 90 tablet 3  . cyanocobalamin 1000 MCG tablet Take one by mouth daily     . furosemide (LASIX) 20 MG tablet TAKE 1/2 TABLET (10 MG TOTAL) BY MOUTH DAILY. 135 tablet 1  . isosorbide mononitrate (IMDUR) 60 MG 24 hr tablet Take 1 tablet by mouth daily 90 tablet 0  . magnesium oxide (MAG-OX) 400 MG tablet Take 400 mg by mouth daily.      . meclizine (ANTIVERT) 25 MG tablet Take 2 tablets (50 mg total) by mouth 3 (three) times daily as needed for dizziness or nausea. 30 tablet 1  . Omega-3 Fatty Acids (FISH OIL) 1200 MG CAPS Take one by mouth daily     . pravastatin (PRAVACHOL) 80 MG tablet Take 1 tablet by mouth every day (Office visit needed for further refills) 90 tablet 0  . TIMOLOL MALEATE PF OP Use one drop both eyes 2 times a day     . metroNIDAZOLE (FLAGYL) 500 MG tablet Take 1 tablet (500 mg total) by mouth 3 (three) times daily. (Patient not taking: Reported on 11/14/2015) 21 tablet 0  . ondansetron (ZOFRAN) 4 MG tablet Take 1 tablet (4 mg total) by mouth daily as needed for nausea or vomiting. 20 tablet 1  . sulfamethoxazole-trimethoprim (BACTRIM DS) 800-160 MG per tablet Take 1 tablet by mouth 2 (two) times daily. 6 tablet 0   No facility-administered medications prior to visit.    Review of Systems    Patient denies headache, fevers, malaise, unintentional weight loss, skin rash, eye pain, sinus congestion and sinus pain, sore throat, dysphagia,  hemoptysis , cough, dyspnea, wheezing, chest pain, palpitations, orthopnea, edema, abdominal pain, nausea, melena, diarrhea, constipation, flank pain, dysuria, hematuria, urinary  Frequency, nocturia, numbness, tingling, seizures,  Focal weakness, Loss of consciousness,  Tremor, insomnia, depression, anxiety, and suicidal ideation.      Objective:  BP 120/76 mmHg  Pulse 54  Temp(Src) 97.6 F (36.4 C) (Oral)  Resp 12  Ht 5\' 5"  (1.651 m)  Wt 151 lb (68.493 kg)  BMI 25.13 kg/m2  SpO2 96%  Physical Exam   General appearance: alert, cooperative and appears stated age Head: Normocephalic, without obvious abnormality, atraumatic Eyes: conjunctivae/corneas clear. PERRL, EOM's intact. Fundi benign. Ears: normal TM's and external ear canals both ears Nose: Nares normal. Septum midline. Mucosa normal. No drainage or sinus tenderness. Throat: lips, mucosa, and tongue normal; teeth and gums normal Neck: no adenopathy, no carotid bruit, no JVD, supple, symmetrical, trachea midline and thyroid not  enlarged, symmetric, no tenderness/mass/nodules Lungs: clear to auscultation bilaterally Breasts: normal appearance, no masses or tenderness Heart: regular rate and rhythm, S1, S2 normal, no murmur, click, rub or gallop Abdomen: soft, non-tender; bowel sounds normal; no masses,  no organomegaly Extremities: extremities normal, atraumatic, no cyanosis or edema Pulses: 2+ and symmetric Skin: Skin color, texture, turgor normal. No rashes or lesions Neurologic: Alert and oriented X 3, normal strength and tone. Normal symmetric reflexes. Normal coordination and gait.     Assessment & Plan:   Problem List Items Addressed This Visit    Medicare annual wellness visit, subsequent    Annual Medicare wellness  exam was done as well as a comprehensive physical  exam and management of acute and chronic conditions .  During the course of the visit the patient was educated and counseled about appropriate screening and preventive services including : fall prevention , diabetes screening, nutrition counseling, colorectal cancer screening, and recommended immunizations.  Printed recommendations for health maintenance screenings was given.       History of vertigo    resolved by ENT with DHP maneuver.  No recurrence thus far.       Dysuria    She was unable to provide a urine specimen for testing today.       Relevant Orders   POCT urinalysis dipstick   Vitamin D deficiency   Relevant Orders   VITAMIN D 25 Hydroxy (Vit-D Deficiency, Fractures) (Completed)    Other Visit Diagnoses    Other problems related to lifestyle    -  Primary    Relevant Orders    Hepatitis C antibody (Completed)    Breast cancer screening        Relevant Orders    MM DIGITAL SCREENING BILATERAL    Hyperlipidemia        Relevant Orders    Lipid panel (Completed)    Other fatigue        Relevant Orders    Comprehensive metabolic panel (Completed)    TSH (Completed)    CBC with Differential/Platelet (Completed)    Need for prophylactic vaccination against Streptococcus pneumoniae (pneumococcus)        Relevant Orders    Pneumococcal polysaccharide vaccine 23-valent greater than or equal to 2yo subcutaneous/IM (Completed)       I have discontinued Ms. Camberos's metroNIDAZOLE, ondansetron, and sulfamethoxazole-trimethoprim. I am also having her maintain her TIMOLOL MALEATE PF OP, aspirin, carbonyl iron, magnesium oxide, Fish Oil, cyanocobalamin, Vitamin D3, ALPRAZolam, meclizine, acyclovir, furosemide, pravastatin, and isosorbide mononitrate.  No orders of the defined types were placed in this encounter.    Medications Discontinued During This Encounter  Medication Reason  . metroNIDAZOLE (FLAGYL) 500 MG tablet Completed Course  . ondansetron (ZOFRAN) 4 MG tablet  Completed Course  . sulfamethoxazole-trimethoprim (BACTRIM DS) 800-160 MG per tablet Completed Course    Follow-up: Return in about 6 months (around 05/15/2016).   Crecencio Mc, MD

## 2015-11-14 NOTE — Progress Notes (Signed)
Pre-visit discussion using our clinic review tool. No additional management support is needed unless otherwise documented below in the visit note.  

## 2015-11-15 ENCOUNTER — Encounter: Payer: Self-pay | Admitting: *Deleted

## 2015-11-15 LAB — HEPATITIS C ANTIBODY: HCV Ab: NEGATIVE

## 2015-11-15 NOTE — Telephone Encounter (Signed)
Letter mailed to patient to schedule appt soon

## 2015-11-16 DIAGNOSIS — Z87898 Personal history of other specified conditions: Secondary | ICD-10-CM | POA: Insufficient documentation

## 2015-11-16 DIAGNOSIS — R300 Dysuria: Secondary | ICD-10-CM | POA: Insufficient documentation

## 2015-11-16 NOTE — Assessment & Plan Note (Signed)

## 2015-11-16 NOTE — Assessment & Plan Note (Signed)
resolved by ENT with DHP maneuver.  No recurrence thus far.

## 2015-11-16 NOTE — Assessment & Plan Note (Signed)
She was unable to provide a urine specimen for testing today.

## 2015-11-18 ENCOUNTER — Telehealth: Payer: Self-pay | Admitting: *Deleted

## 2015-11-18 NOTE — Telephone Encounter (Signed)
Patient requested lab results from 11/14/15

## 2015-11-18 NOTE — Telephone Encounter (Signed)
Pt notified of results-see result note for documentation details

## 2015-12-03 ENCOUNTER — Telehealth: Payer: Self-pay | Admitting: Internal Medicine

## 2015-12-03 ENCOUNTER — Other Ambulatory Visit (INDEPENDENT_AMBULATORY_CARE_PROVIDER_SITE_OTHER): Payer: PPO

## 2015-12-03 DIAGNOSIS — N3289 Other specified disorders of bladder: Secondary | ICD-10-CM

## 2015-12-03 DIAGNOSIS — R3989 Other symptoms and signs involving the genitourinary system: Secondary | ICD-10-CM

## 2015-12-03 LAB — POCT URINALYSIS DIPSTICK
Bilirubin, UA: NEGATIVE
GLUCOSE UA: NEGATIVE
Ketones, UA: NEGATIVE
NITRITE UA: NEGATIVE
PROTEIN UA: NEGATIVE
Spec Grav, UA: <=1.005
UROBILINOGEN UA: 0.2
pH, UA: 6

## 2015-12-03 LAB — URINALYSIS, ROUTINE W REFLEX MICROSCOPIC
BILIRUBIN URINE: NEGATIVE
Ketones, ur: NEGATIVE
NITRITE: NEGATIVE
Specific Gravity, Urine: <=1.005 — AB (ref 1.000–1.030)
TOTAL PROTEIN, URINE-UPE24: NEGATIVE
Urine Glucose: NEGATIVE
Urobilinogen, UA: 0.2 (ref 0.0–1.0)
pH: 6 (ref 5.0–8.0)

## 2015-12-03 NOTE — Addendum Note (Signed)
Addended by: Leeanne Rio on: 12/03/2015 09:44 AM   Modules accepted: Orders

## 2015-12-03 NOTE — Telephone Encounter (Signed)
Patient saw Dr. Derrel Nip on 11/14/15 and was suppose to leave a urine sample, but didn't, order is in the computer she is going to stop by today to give a sample. thanks

## 2015-12-03 NOTE — Telephone Encounter (Signed)
Caller name: Audrey Duran Relationship to patient: patient Can be reached:  [phone]  Reason for call: pt called requesting to bring a urine sample in today because she is feeling having. Please advise pt

## 2015-12-05 LAB — URINE CULTURE
COLONY COUNT: NO GROWTH
Organism ID, Bacteria: NO GROWTH

## 2015-12-06 ENCOUNTER — Telehealth: Payer: Self-pay | Admitting: *Deleted

## 2015-12-06 NOTE — Telephone Encounter (Signed)
There is no evidence of UTI by culture results.  If she is still having symptoms she should make appt for pelvic exam

## 2015-12-06 NOTE — Telephone Encounter (Signed)
Patient requested a call in regards to her on 12-03-15

## 2015-12-06 NOTE — Telephone Encounter (Signed)
Please advise for lab results from 5/9, thanks

## 2015-12-06 NOTE — Telephone Encounter (Signed)
Left message on voicemail.

## 2015-12-17 DIAGNOSIS — H40003 Preglaucoma, unspecified, bilateral: Secondary | ICD-10-CM | POA: Diagnosis not present

## 2016-01-07 ENCOUNTER — Telehealth: Payer: Self-pay | Admitting: *Deleted

## 2016-01-07 NOTE — Telephone Encounter (Signed)
Order is in, please assist in scheduling, thanks

## 2016-01-07 NOTE — Telephone Encounter (Signed)
Voicemail;Patient stated that she was to have a mammogram, but hasn't received a call in reference to the appt.  Pt contact [phone]

## 2016-01-24 ENCOUNTER — Ambulatory Visit
Admission: RE | Admit: 2016-01-24 | Discharge: 2016-01-24 | Disposition: A | Discharge status: Home / Self Care | Payer: PPO | Source: Ambulatory Visit | Attending: Internal Medicine | Admitting: Internal Medicine

## 2016-01-24 DIAGNOSIS — Z1239 Encounter for other screening for malignant neoplasm of breast: Secondary | ICD-10-CM

## 2016-02-04 ENCOUNTER — Other Ambulatory Visit: Payer: Self-pay | Admitting: Internal Medicine

## 2016-02-10 ENCOUNTER — Telehealth: Payer: Self-pay | Admitting: *Deleted

## 2016-02-10 NOTE — Telephone Encounter (Signed)
Patient was previously on Noravasc, it was discontinued in 2016.  Spoke with patient and advised her to get rid of it as she doesn't  Take it anymore. thanks

## 2016-02-10 NOTE — Telephone Encounter (Signed)
Pt stated that she found a bottle of pills behind her refrigerator Norvasc and questioned the reason for this medication, and if she should be taking this.  Pt contact [phone]

## 2016-02-24 DIAGNOSIS — I255 Ischemic cardiomyopathy: Secondary | ICD-10-CM | POA: Diagnosis not present

## 2016-02-24 DIAGNOSIS — I5022 Chronic systolic (congestive) heart failure: Secondary | ICD-10-CM | POA: Diagnosis not present

## 2016-02-24 DIAGNOSIS — I214 Non-ST elevation (NSTEMI) myocardial infarction: Secondary | ICD-10-CM | POA: Diagnosis not present

## 2016-02-24 DIAGNOSIS — R079 Chest pain, unspecified: Secondary | ICD-10-CM | POA: Diagnosis not present

## 2016-02-24 DIAGNOSIS — Z9889 Other specified postprocedural states: Secondary | ICD-10-CM | POA: Diagnosis not present

## 2016-02-24 DIAGNOSIS — J410 Simple chronic bronchitis: Secondary | ICD-10-CM | POA: Diagnosis not present

## 2016-03-03 DIAGNOSIS — R079 Chest pain, unspecified: Secondary | ICD-10-CM | POA: Diagnosis not present

## 2016-03-03 DIAGNOSIS — I5022 Chronic systolic (congestive) heart failure: Secondary | ICD-10-CM | POA: Diagnosis not present

## 2016-03-03 DIAGNOSIS — I214 Non-ST elevation (NSTEMI) myocardial infarction: Secondary | ICD-10-CM | POA: Diagnosis not present

## 2016-03-03 DIAGNOSIS — I255 Ischemic cardiomyopathy: Secondary | ICD-10-CM | POA: Diagnosis not present

## 2016-03-05 ENCOUNTER — Other Ambulatory Visit: Payer: Self-pay | Admitting: Internal Medicine

## 2016-03-05 DIAGNOSIS — B009 Herpesviral infection, unspecified: Secondary | ICD-10-CM

## 2016-03-10 DIAGNOSIS — R079 Chest pain, unspecified: Secondary | ICD-10-CM | POA: Diagnosis not present

## 2016-03-10 DIAGNOSIS — I729 Aneurysm of unspecified site: Secondary | ICD-10-CM | POA: Diagnosis not present

## 2016-03-10 DIAGNOSIS — I255 Ischemic cardiomyopathy: Secondary | ICD-10-CM | POA: Diagnosis not present

## 2016-03-10 DIAGNOSIS — I5022 Chronic systolic (congestive) heart failure: Secondary | ICD-10-CM | POA: Diagnosis not present

## 2016-03-10 DIAGNOSIS — I214 Non-ST elevation (NSTEMI) myocardial infarction: Secondary | ICD-10-CM | POA: Diagnosis not present

## 2016-03-10 DIAGNOSIS — Z9889 Other specified postprocedural states: Secondary | ICD-10-CM | POA: Diagnosis not present

## 2016-03-10 DIAGNOSIS — J410 Simple chronic bronchitis: Secondary | ICD-10-CM | POA: Diagnosis not present

## 2016-03-20 ENCOUNTER — Ambulatory Visit
Admission: RE | Admit: 2016-03-20 | Discharge: 2016-03-20 | Disposition: A | Discharge status: Home / Self Care | Payer: PPO | Source: Ambulatory Visit | Attending: Internal Medicine | Admitting: Internal Medicine

## 2016-03-20 ENCOUNTER — Other Ambulatory Visit: Payer: Self-pay | Admitting: Internal Medicine

## 2016-03-20 ENCOUNTER — Ambulatory Visit: Admission: RE | Admit: 2016-03-20 | Payer: PPO | Source: Ambulatory Visit

## 2016-03-20 DIAGNOSIS — Z1239 Encounter for other screening for malignant neoplasm of breast: Secondary | ICD-10-CM

## 2016-03-20 DIAGNOSIS — Z1231 Encounter for screening mammogram for malignant neoplasm of breast: Secondary | ICD-10-CM | POA: Diagnosis not present

## 2016-04-24 ENCOUNTER — Encounter (INDEPENDENT_AMBULATORY_CARE_PROVIDER_SITE_OTHER): Payer: Self-pay

## 2016-04-24 ENCOUNTER — Ambulatory Visit (INDEPENDENT_AMBULATORY_CARE_PROVIDER_SITE_OTHER): Payer: PPO

## 2016-04-24 ENCOUNTER — Ambulatory Visit (INDEPENDENT_AMBULATORY_CARE_PROVIDER_SITE_OTHER): Payer: PPO | Admitting: Family

## 2016-04-24 ENCOUNTER — Other Ambulatory Visit: Payer: Self-pay | Admitting: Family

## 2016-04-24 ENCOUNTER — Encounter: Payer: Self-pay | Admitting: Family

## 2016-04-24 ENCOUNTER — Ambulatory Visit: Payer: PPO | Admitting: Family

## 2016-04-24 VITALS — BP 128/80 | HR 64 | Temp 97.3°F | Wt 148.8 lb

## 2016-04-24 DIAGNOSIS — J181 Lobar pneumonia, unspecified organism: Secondary | ICD-10-CM | POA: Diagnosis not present

## 2016-04-24 DIAGNOSIS — J209 Acute bronchitis, unspecified: Secondary | ICD-10-CM | POA: Diagnosis not present

## 2016-04-24 DIAGNOSIS — J189 Pneumonia, unspecified organism: Secondary | ICD-10-CM

## 2016-04-24 MED ORDER — DOXYCYCLINE HYCLATE 100 MG PO TABS: 100.0000 mg | ORAL_TABLET | Freq: Two times a day (BID) | ORAL | 0 refills | Status: DC

## 2016-04-24 MED ORDER — BENZONATATE 100 MG PO CAPS: 100.0000 mg | ORAL_CAPSULE | Freq: Two times a day (BID) | ORAL | 0 refills | Status: DC | PRN

## 2016-04-24 NOTE — Progress Notes (Signed)
Subjective:    Patient ID: Audrey Duran, female    DOB: 19-Dec-1934, 81 y.o.   MRN: [id number]  CC: Audrey Duran is a 81 y.o. female who presents today for an acute visit.    HPI: Patient is here for an acute visit chief complaint of dry cough x one week, waxing and waning. Cough worse at night. She has a history of COPD. Tried mucinex with some relief. No SOB, wheezing, fever, nasal congestion. Endorses chills.   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     HISTORY:  Past Medical History:  Diagnosis Date  . Anemia   . Atrial fibrillation ([place])   . COPD (chronic obstructive pulmonary disease) ([place])   . Coronary artery disease 1995   s/p AMI , no history of stents,  [name]  . Hyperlipidemia   . Ischemic cardiomyopathy Dec 2011   ETT Sestamibi study apical scar, no ischemia, [name]  . Myocardial infarction ([place])   . Recurrent HSV (herpes simplex virus)    Past Surgical History:  Procedure Laterality Date  . ABDOMINAL HYSTERECTOMY  june 2014  . BREAST BIOPSY Left    neg  . 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 CATARACT EXTRACTION  2004, 2006  . JOINT REPLACEMENT  July 2013   total hip , [facility]   Family History  Problem Relation Age of Onset  . Cancer Mother 24    Pancreatic   . Heart disease Father   . Cancer Sister 53    Breast Cancer  . Breast cancer Sister 41    Allergies: Azithromycin; Clarithromycin; Diclofenac; Lisinopril; Meloxicam; Nitrofurantoin monohyd macro; Zocor [simvastatin]; and Penicillins Current Outpatient Prescriptions on File Prior to Visit  Medication Sig Dispense Refill  . acyclovir (ZOVIRAX) 400 MG tablet Take 1 tablet (400 mg total) by mouth daily. Must keep OV appt 90 tablet 1  . ALPRAZolam (XANAX) 0.5 MG tablet Take 1 tablet (0.5 mg total) by mouth at bedtime as needed for sleep. 90 tablet 1  . aspirin 81 MG tablet Take 160 mg by mouth daily.      . carbonyl iron  (FEOSOL) 45 MG TABS Take one by mouth daily    . Cholecalciferol (VITAMIN D3) 3000 UNITS TABS Take 1 tablet by mouth daily. Take one by mouth daily 90 tablet 3  . cyanocobalamin 1000 MCG tablet Take one by mouth daily     . furosemide (LASIX) 20 MG tablet Take 1/2 tablet by mouth daily (10mg  total) Must keep OV appt for further refills 135 tablet 1  . isosorbide mononitrate (IMDUR) 60 MG 24 hr tablet Take 1 tablet (60 mg total) by mouth daily. Must keep OV appt for further refills 90 tablet 1  . magnesium oxide (MAG-OX) 400 MG tablet Take 400 mg by mouth daily.      . meclizine (ANTIVERT) 25 MG tablet Take 2 tablets (50 mg total) by mouth 3 (three) times daily as needed for dizziness or nausea. 30 tablet 1  . Omega-3 Fatty Acids (FISH OIL) 1200 MG CAPS Take one by mouth daily     . pravastatin (PRAVACHOL) 80 MG tablet Take 1 tablet by mouth every day (Office visit needed for further refills) 90 tablet 2  . TIMOLOL MALEATE PF OP Use one drop both eyes 2 times a day      No current facility-administered medications on file prior to visit.  Social History  Substance Use Topics  . Smoking status: Former Smoker    Quit date: 01/13/1992  . Smokeless tobacco: Never Used  . Alcohol use No    Review of Systems  Constitutional: Positive for chills. Negative for fever.  HENT: Negative for congestion, ear pain, rhinorrhea, sinus pressure and sore throat.   Respiratory: Positive for cough. Negative for shortness of breath and wheezing.   Cardiovascular: Negative for chest pain and palpitations.  Gastrointestinal: Negative for nausea and vomiting.      Objective:    BP 128/80   Pulse 64   Temp 97.3 F (36.3 C) (Oral)   Wt 148 lb 12.8 oz (67.5 kg)   SpO2 92%   BMI 24.76 kg/m    Physical Exam  Constitutional: She appears well-developed and well-nourished.  HENT:  Head: Normocephalic and atraumatic.  Right Ear: Hearing, tympanic membrane, external ear and ear canal normal. No drainage,  swelling or tenderness. No foreign bodies. Tympanic membrane is not erythematous and not bulging. No middle ear effusion. No decreased hearing is noted.  Left Ear: Hearing, tympanic membrane, external ear and ear canal normal. No drainage, swelling or tenderness. No foreign bodies. Tympanic membrane is not erythematous and not bulging.  No middle ear effusion. No decreased hearing is noted.  Nose: Nose normal. No rhinorrhea. Right sinus exhibits no maxillary sinus tenderness and no frontal sinus tenderness. Left sinus exhibits no maxillary sinus tenderness and no frontal sinus tenderness.  Mouth/Throat: Uvula is midline, oropharynx is clear and moist and mucous membranes are normal. No oropharyngeal exudate, posterior oropharyngeal edema, posterior oropharyngeal erythema or tonsillar abscesses.  Eyes: Conjunctivae are normal.  Cardiovascular: Regular rhythm, normal heart sounds and normal pulses.   Pulmonary/Chest: Effort normal and breath sounds normal. She has no wheezes. She has no rhonchi. She has no rales.  Lymphadenopathy:       Head (right side): No submental, no submandibular, no tonsillar, no preauricular, no posterior auricular and no occipital adenopathy present.       Head (left side): No submental, no submandibular, no tonsillar, no preauricular, no posterior auricular and no occipital adenopathy present.    She has no cervical adenopathy.  Neurological: She is alert.  Skin: Skin is warm and dry.  Psychiatric: She has a normal mood and affect. Her speech is normal and behavior is normal. Thought content normal.  Vitals reviewed.      Assessment & Plan:   1. Acute bronchitis, unspecified organism Due to duration of symptoms and history of COPD, patient I jointly decided to start antibiotic therapy. Pending chest x-ray to evaluate for pneumonia. Afebrile today and no adventitious lung sounds. Return precautions given.  - DG Chest 2 View -Doxycycline -tessalon  I am having Ms.  Nack maintain her TIMOLOL MALEATE PF OP, aspirin, carbonyl iron, magnesium oxide, Fish Oil, cyanocobalamin, Vitamin D3, ALPRAZolam, meclizine, pravastatin, isosorbide mononitrate, furosemide, and acyclovir.   No orders of the defined types were placed in this encounter.   Return precautions given.   Risks, benefits, and alternatives of the medications and treatment plan prescribed today were discussed, and patient expressed understanding.   Education regarding symptom management and diagnosis given to patient on AVS.  Continue to follow with TULLO, Aris Everts, MD for routine health maintenance.   Audrey Duran and I agreed with plan.   Mable Paris, FNP

## 2016-04-24 NOTE — Progress Notes (Signed)
H/o ischemic cardiomyopathy; afib  Due to history and allergies, I decided to use doxycycline.

## 2016-04-24 NOTE — Progress Notes (Signed)
Pre visit review using our clinic review tool, if applicable. No additional management support is needed unless otherwise documented below in the visit note. 

## 2016-04-24 NOTE — Patient Instructions (Signed)

## 2016-04-27 ENCOUNTER — Telehealth: Payer: Self-pay | Admitting: *Deleted

## 2016-04-27 NOTE — Telephone Encounter (Signed)
Pt has requested her Xray results  Pt contact [phone]

## 2016-04-27 NOTE — Telephone Encounter (Signed)
Patient was informed of results.  Patient understood and no questions, comments, or concerns at this time.  

## 2016-05-07 ENCOUNTER — Encounter: Payer: Self-pay | Admitting: Family

## 2016-05-07 ENCOUNTER — Ambulatory Visit (INDEPENDENT_AMBULATORY_CARE_PROVIDER_SITE_OTHER): Payer: PPO | Admitting: Family

## 2016-05-07 VITALS — BP 124/60 | HR 64 | Temp 98.1°F | Resp 94 | Wt 147.8 lb

## 2016-05-07 DIAGNOSIS — J181 Lobar pneumonia, unspecified organism: Secondary | ICD-10-CM

## 2016-05-07 DIAGNOSIS — J189 Pneumonia, unspecified organism: Secondary | ICD-10-CM

## 2016-05-07 MED ORDER — DEXTROMETHORPHAN-GUAIFENESIN 5-100 MG/5ML PO LIQD
10.0000 mL | Freq: Every evening | ORAL | 0 refills | Status: DC | PRN
Start: 2016-05-07 — End: 2017-05-20

## 2016-05-07 MED ORDER — ALBUTEROL SULFATE HFA 108 (90 BASE) MCG/ACT IN AERS: 2.0000 | INHALATION_SPRAY | Freq: Four times a day (QID) | RESPIRATORY_TRACT | 1 refills | Status: DC | PRN

## 2016-05-07 NOTE — Progress Notes (Signed)
Subjective:    Patient ID: Audrey Duran, female    DOB: Apr 27, 1935, 81 y.o.   MRN: [id number]  CC: [name] is a 81 y.o. female who presents today for an acute visit.    HPI: Patient here for acute visit for reevaluation of wet cough, primarily bothering her at night when she lays down, unchanged. history of COPD. Last seen 2 weeks ago 9/29 for RLL PNA , started on doxycycline and tessalon perles with only minimal relief.  feeling like congestion 'has broken up.'  Cough is worse at night when she lays down. No fever, chills.       HISTORY:  Past Medical History:  Diagnosis Date  . Anemia   . Atrial fibrillation ([place])   . COPD (chronic obstructive pulmonary disease) ([place])   . Coronary artery disease 1995   s/p AMI , no history of stents,  [name]  . Hyperlipidemia   . Ischemic cardiomyopathy Dec 2011   ETT Sestamibi study apical scar, no ischemia, [name]  . Myocardial infarction   . Recurrent HSV (herpes simplex virus)    Past Surgical History:  Procedure Laterality Date  . ABDOMINAL HYSTERECTOMY  june 2014  . BREAST BIOPSY Left    neg  . 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 CATARACT EXTRACTION  2004, 2006  . JOINT REPLACEMENT  July 2013   total hip , [facility]   Family History  Problem Relation Age of Onset  . Cancer Mother 61    Pancreatic   . Heart disease Father   . Cancer Sister 64    Breast Cancer  . Breast cancer Sister 50    Allergies: Azithromycin; Clarithromycin; Diclofenac; Lisinopril; Meloxicam; Nitrofurantoin monohyd macro; Zocor [simvastatin]; and Penicillins Current Outpatient Prescriptions on File Prior to Visit  Medication Sig Dispense Refill  . acyclovir (ZOVIRAX) 400 MG tablet Take 1 tablet (400 mg total) by mouth daily. Must keep OV appt 90 tablet 1  . ALPRAZolam (XANAX) 0.5 MG tablet Take 1 tablet (0.5 mg total) by mouth at bedtime as needed for sleep. 90 tablet 1  . aspirin 81 MG tablet Take 160 mg by  mouth daily.      . benzonatate (TESSALON) 100 MG capsule Take 1 capsule (100 mg total) by mouth 2 (two) times daily as needed for cough. 20 capsule 0  . carbonyl iron (FEOSOL) 45 MG TABS Take one by mouth daily    . Cholecalciferol (VITAMIN D3) 3000 UNITS TABS Take 1 tablet by mouth daily. Take one by mouth daily 90 tablet 3  . cyanocobalamin 1000 MCG tablet Take one by mouth daily     . doxycycline (VIBRA-TABS) 100 MG tablet Take 1 tablet (100 mg total) by mouth 2 (two) times daily. 10 tablet 0  . furosemide (LASIX) 20 MG tablet Take 1/2 tablet by mouth daily (10mg  total) Must keep OV appt for further refills 135 tablet 1  . isosorbide mononitrate (IMDUR) 60 MG 24 hr tablet Take 1 tablet (60 mg total) by mouth daily. Must keep OV appt for further refills 90 tablet 1  . magnesium oxide (MAG-OX) 400 MG tablet Take 400 mg by mouth daily.      . meclizine (ANTIVERT) 25 MG tablet Take 2 tablets (50 mg total) by mouth 3 (three) times daily as needed for dizziness or nausea. 30 tablet 1  . Omega-3 Fatty Acids (FISH OIL) 1200 MG CAPS Take one by mouth daily     .  pravastatin (PRAVACHOL) 80 MG tablet Take 1 tablet by mouth every day (Office visit needed for further refills) 90 tablet 2  . TIMOLOL MALEATE PF OP Use one drop both eyes 2 times a day      No current facility-administered medications on file prior to visit.     Social History  Substance Use Topics  . Smoking status: Former Smoker    Quit date: 01/13/1992  . Smokeless tobacco: Never Used  . Alcohol use No    Review of Systems  Constitutional: Negative for chills and fever.  HENT: Negative for congestion, ear pain, sinus pressure and sore throat.   Respiratory: Positive for cough. Negative for shortness of breath and wheezing.   Cardiovascular: Negative for chest pain and palpitations.  Gastrointestinal: Negative for nausea and vomiting.  Neurological: Negative for headaches.      Objective:    BP 124/60   Pulse 64   Temp 98.1  F (36.7 C) (Oral)   Resp (!) 94   Wt 147 lb 12.8 oz (67 kg)   BMI 24.60 kg/m    Physical Exam  Constitutional: She appears well-developed and well-nourished.  HENT:  Head: Normocephalic and atraumatic.  Right Ear: Hearing, tympanic membrane, external ear and ear canal normal. No drainage, swelling or tenderness. No foreign bodies. Tympanic membrane is not erythematous and not bulging. No middle ear effusion. No decreased hearing is noted.  Left Ear: Hearing, tympanic membrane, external ear and ear canal normal. No drainage, swelling or tenderness. No foreign bodies. Tympanic membrane is not erythematous and not bulging.  No middle ear effusion. No decreased hearing is noted.  Nose: Nose normal. No rhinorrhea. Right sinus exhibits no maxillary sinus tenderness and no frontal sinus tenderness. Left sinus exhibits no maxillary sinus tenderness and no frontal sinus tenderness.  Mouth/Throat: Uvula is midline, oropharynx is clear and moist and mucous membranes are normal. No oropharyngeal exudate, posterior oropharyngeal edema, posterior oropharyngeal erythema or tonsillar abscesses.  Eyes: Conjunctivae are normal.  Cardiovascular: Regular rhythm, normal heart sounds and normal pulses.   Pulmonary/Chest: Effort normal and breath sounds normal. She has no wheezes. She has no rhonchi. She has no rales.  Lymphadenopathy:       Head (right side): No submental, no submandibular, no tonsillar, no preauricular, no posterior auricular and no occipital adenopathy present.       Head (left side): No submental, no submandibular, no tonsillar, no preauricular, no posterior auricular and no occipital adenopathy present.    She has no cervical adenopathy.  Neurological: She is alert.  Skin: Skin is warm and dry.  Psychiatric: She has a normal mood and affect. Her speech is normal and behavior is normal. Thought content normal.  Vitals reviewed.      Assessment & Plan:   1.  pneumonia of right lower  lobe, unspecified aspiration pneumonia type ([place]) SaO2 94%. Afebrile. Patient was treated with doxycycline which is an appropriate community-acquired pneumonia agent. Given cough syrup with mucinex to help further breakup congestion and allow patient some rest. She's not been using an inhaler and encouraged her to use that every 6 hours for the next day to further help with her cough in the context of h/o COPD. We decided to not repeat chest x-ray today as it would not change course of treatment. We will continue to monitor patient and recheck chest x-ray in 3-4 months. Patient verbalized understanding of plan. Return precautions given.  - albuterol (PROVENTIL HFA) 108 (90 Base) MCG/ACT inhaler; Inhale 2  puffs into the lungs every 6 (six) hours as needed for wheezing or shortness of breath.  Dispense: 1 Inhaler; Refill: 1 - Dextromethorphan-Guaifenesin 5-100 MG/5ML LIQD; Take 10 mLs by mouth at bedtime as needed (for cough).  Dispense: 1 Bottle; Refill: 0    I am having Ms. Imperato start on albuterol and Dextromethorphan-Guaifenesin. I am also having her maintain her TIMOLOL MALEATE PF OP, aspirin, carbonyl iron, magnesium oxide, Fish Oil, cyanocobalamin, Vitamin D3, ALPRAZolam, meclizine, pravastatin, isosorbide mononitrate, furosemide, acyclovir, benzonatate, and doxycycline.   Meds ordered this encounter  Medications  . albuterol (PROVENTIL HFA) 108 (90 Base) MCG/ACT inhaler    Sig: Inhale 2 puffs into the lungs every 6 (six) hours as needed for wheezing or shortness of breath.    Dispense:  1 Inhaler    Refill:  1    Order Specific Question:   Supervising Provider    Answer:   Derrel Nip, TERESA L [[id number]]  . Dextromethorphan-Guaifenesin 5-100 MG/5ML LIQD    Sig: Take 10 mLs by mouth at bedtime as needed (for cough).    Dispense:  1 Bottle    Refill:  0    Order Specific Question:   Supervising Provider    Answer:   Crecencio Mc [[id number]]    Return precautions given.   Risks, benefits,  and alternatives of the medications and treatment plan prescribed today were discussed, and patient expressed understanding.   Education regarding symptom management and diagnosis given to patient on AVS.  Continue to follow with TULLO, Aris Everts, MD for routine health maintenance.   Audrey Duran and I agreed with plan.   Mable Paris, FNP

## 2016-05-07 NOTE — Patient Instructions (Signed)
remember to come back for chest xray in 3-4 months.   Let me know if you are not better.   Increase intake of clear fluids. Congestion is best treated by hydration, when mucus is wetter, it is thinner, less sticky, and easier to expel from the body, either through coughing up drainage, or by blowing your nose.   Get plenty of rest.   Use saline nasal drops and blow your nose frequently. Run a humidifier at night and elevate the head of the bed. Vicks Vapor rub will help with congestion and cough. Steam showers and sinus massage for congestion.   Use Acetaminophen or Ibuprofen as needed for fever or pain. Avoid second hand smoke. Even the smallest exposure will worsen symptoms.   Over the counter medications you can try include Delsym for cough, a decongestant for congestion, and Mucinex or Robitussin as an expectorant. Be sure to just get the plain Mucinex or Robitussin that just has one medication (Guaifenesen). We don't recommend the combination products. Note, be sure to drink two glasses of water with each dose of Mucinex as the medication will not work well without adequate hydration.   You can also try a teaspoon of honey to see if this will help reduce cough. Throat lozenges can sometimes be beneficial as well.    This illness will typically last 7 - 10 days.   Please follow up with our clinic if you develop a fever greater than 101 F, symptoms worsen, or do not resolve in the next week.

## 2016-05-07 NOTE — Progress Notes (Signed)
Pre visit review using our clinic review tool, if applicable. No additional management support is needed unless otherwise documented below in the visit note. 

## 2016-05-12 NOTE — Progress Notes (Signed)
Left message for patient to return call back.  

## 2016-05-13 ENCOUNTER — Telehealth: Payer: Self-pay | Admitting: Internal Medicine

## 2016-05-13 NOTE — Telephone Encounter (Signed)
Pt called and left message stating that you had called and was returning your call. Thank you!  Call pt @ [phone]

## 2016-05-14 NOTE — Progress Notes (Signed)
Patient stated that she has cough still but she is getting better.

## 2016-05-14 NOTE — Telephone Encounter (Signed)
Patient stated she feels better but still has cough.

## 2016-05-18 ENCOUNTER — Telehealth: Payer: Self-pay

## 2016-05-18 DIAGNOSIS — D721 Eosinophilia, unspecified: Secondary | ICD-10-CM

## 2016-05-18 DIAGNOSIS — N183 Chronic kidney disease, stage 3 unspecified: Secondary | ICD-10-CM

## 2016-05-18 DIAGNOSIS — R5383 Other fatigue: Secondary | ICD-10-CM

## 2016-05-18 DIAGNOSIS — E559 Vitamin D deficiency, unspecified: Secondary | ICD-10-CM

## 2016-05-18 DIAGNOSIS — I2510 Atherosclerotic heart disease of native coronary artery without angina pectoris: Secondary | ICD-10-CM

## 2016-05-18 NOTE — Telephone Encounter (Signed)
Pt coming for labs 05/19/16. Please place future orders. Thank you.

## 2016-05-19 ENCOUNTER — Other Ambulatory Visit (INDEPENDENT_AMBULATORY_CARE_PROVIDER_SITE_OTHER): Payer: PPO

## 2016-05-19 DIAGNOSIS — I2510 Atherosclerotic heart disease of native coronary artery without angina pectoris: Secondary | ICD-10-CM | POA: Diagnosis not present

## 2016-05-19 DIAGNOSIS — R5383 Other fatigue: Secondary | ICD-10-CM | POA: Diagnosis not present

## 2016-05-19 DIAGNOSIS — N183 Chronic kidney disease, stage 3 unspecified: Secondary | ICD-10-CM

## 2016-05-19 DIAGNOSIS — E559 Vitamin D deficiency, unspecified: Secondary | ICD-10-CM

## 2016-05-19 DIAGNOSIS — D721 Eosinophilia, unspecified: Secondary | ICD-10-CM

## 2016-05-19 LAB — COMPREHENSIVE METABOLIC PANEL
ALBUMIN: 3.9 g/dL (ref 3.5–5.2)
ALT: 13 U/L (ref 0–35)
AST: 16 U/L (ref 0–37)
Alkaline Phosphatase: 66 U/L (ref 39–117)
BUN: 14 mg/dL (ref 6–23)
CHLORIDE: 106 meq/L (ref 96–112)
CO2: 29 mEq/L (ref 19–32)
Calcium: 10 mg/dL (ref 8.4–10.5)
Creatinine, Ser: 0.98 mg/dL (ref 0.40–1.20)
GFR: 58.2 mL/min — ABNORMAL LOW (ref >60.00)
Glucose, Bld: 116 mg/dL — ABNORMAL HIGH (ref 70–99)
POTASSIUM: 4.4 meq/L (ref 3.5–5.1)
SODIUM: 139 meq/L (ref 135–145)
Total Bilirubin: 0.5 mg/dL (ref 0.2–1.2)
Total Protein: 7.1 g/dL (ref 6.0–8.3)

## 2016-05-19 LAB — CBC WITH DIFFERENTIAL/PLATELET
BASOS ABS: 0.1 10*3/uL (ref 0.0–0.1)
Basophils Relative: 0.8 % (ref 0.0–3.0)
Eosinophils Absolute: 0.6 10*3/uL (ref 0.0–0.7)
Eosinophils Relative: 7.4 % — ABNORMAL HIGH (ref 0.0–5.0)
HEMATOCRIT: 39.7 % (ref 36.0–46.0)
HEMOGLOBIN: 13.7 g/dL (ref 12.0–15.0)
LYMPHS PCT: 22.7 % (ref 12.0–46.0)
Lymphs Abs: 2 10*3/uL (ref 0.7–4.0)
MCHC: 34.4 g/dL (ref 30.0–36.0)
MCV: 92.2 fl (ref 78.0–100.0)
MONOS PCT: 7.6 % (ref 3.0–12.0)
Monocytes Absolute: 0.7 10*3/uL (ref 0.1–1.0)
NEUTROS ABS: 5.3 10*3/uL (ref 1.4–7.7)
Neutrophils Relative %: 61.5 % (ref 43.0–77.0)
Platelets: 201 10*3/uL (ref 150.0–400.0)
RBC: 4.31 Mil/uL (ref 3.87–5.11)
RDW: 14.5 % (ref 11.5–15.5)
WBC: 8.6 10*3/uL (ref 4.0–10.5)

## 2016-05-19 LAB — LIPID PANEL
CHOL/HDL RATIO: 3
Cholesterol: 143 mg/dL (ref 0–200)
HDL: 48.5 mg/dL (ref >39.00)
LDL CALC: 82 mg/dL (ref 0–99)
NONHDL: 94.62
Triglycerides: 62 mg/dL (ref 0.0–149.0)
VLDL: 12.4 mg/dL (ref 0.0–40.0)

## 2016-05-19 LAB — VITAMIN D 25 HYDROXY (VIT D DEFICIENCY, FRACTURES): VITD: 42.45 ng/mL (ref 30.00–100.00)

## 2016-05-19 LAB — TSH: TSH: 1.86 u[IU]/mL (ref 0.35–4.50)

## 2016-05-20 ENCOUNTER — Ambulatory Visit (INDEPENDENT_AMBULATORY_CARE_PROVIDER_SITE_OTHER): Payer: PPO | Admitting: Internal Medicine

## 2016-05-20 ENCOUNTER — Encounter: Payer: Self-pay | Admitting: Internal Medicine

## 2016-05-20 ENCOUNTER — Ambulatory Visit (INDEPENDENT_AMBULATORY_CARE_PROVIDER_SITE_OTHER): Payer: PPO

## 2016-05-20 VITALS — BP 160/80 | HR 52 | Temp 97.6°F | Resp 14 | Ht 65.0 in | Wt 147.5 lb

## 2016-05-20 DIAGNOSIS — I482 Chronic atrial fibrillation, unspecified: Secondary | ICD-10-CM

## 2016-05-20 DIAGNOSIS — R7301 Impaired fasting glucose: Secondary | ICD-10-CM

## 2016-05-20 DIAGNOSIS — J439 Emphysema, unspecified: Secondary | ICD-10-CM

## 2016-05-20 DIAGNOSIS — J189 Pneumonia, unspecified organism: Secondary | ICD-10-CM

## 2016-05-20 DIAGNOSIS — N183 Chronic kidney disease, stage 3 unspecified: Secondary | ICD-10-CM

## 2016-05-20 DIAGNOSIS — J181 Lobar pneumonia, unspecified organism: Secondary | ICD-10-CM | POA: Diagnosis not present

## 2016-05-20 DIAGNOSIS — F17210 Nicotine dependence, cigarettes, uncomplicated: Secondary | ICD-10-CM | POA: Diagnosis not present

## 2016-05-20 LAB — HEMOGLOBIN A1C: HEMOGLOBIN A1C: 5.7 % (ref 4.6–6.5)

## 2016-05-20 MED ORDER — HYDROCOD POLST-CPM POLST ER 10-8 MG/5ML PO SUER: 5.0000 mL | Freq: Every evening | ORAL | 0 refills | Status: DC | PRN

## 2016-05-20 NOTE — Patient Instructions (Addendum)
You do not need to cough up your phlegm.  Forcing a cough to go "deep" just irritates your bronchial tubes and makes it harder for them to heal ,  So your cough will never resolve  I have prescribed a STRONG cough medicine to take 30 minutes before bedtime.  IT WILL MAKE YOU SLEEPY.    Continue to use the tessalon perles (cough capsules Joycelyn Schmid gave you) during the morning and afternoon  If your chest x ray TODAY  Looks worse,  I will add additional medication   When you are all better,  We need to get pulmonary function tests  And have you see a pulmonologist   Whenever you are treated with an antibiotic (such as doxycycline),  Please take a probiotic ( Align, Floraque or Culturelle), the generic version of one of these over the counter medications, or an alternative form (kombucha,  Activia Yogurt, or another dietary source) for a minimum of 3 weeks to prevent a serious antibiotic associated diarrhea  Called clostridium dificile colitis.  Taking a probiotic may also prevent vaginitis due to yeast infections and can be continued indefinitely if you feel that it improves your digestion or your elimination (bowels).    Return in one month for BP check. With me

## 2016-05-20 NOTE — Progress Notes (Signed)
Subjective:  Patient ID: Audrey Duran, female    DOB: 1935-03-10  Age: 81 y.o. MRN: [id number]  CC: The primary encounter diagnosis was Impaired fasting glucose. Diagnoses of Pneumonia of right lower lobe due to infectious organism [facility]), Pulmonary emphysema, unspecified emphysema type ([place]), CKD (chronic kidney disease) stage 3, GFR 30-59 ml/min, and Chronic atrial fibrillation ([place]) were also pertinent to this visit.  HPI Audrey Duran presents for follow up on RLL PNA ,treated Sept 29th  by Margarette rnette, NP with doxycycline and seen for repeat check on Oct 12th. She states that she finished the course of doxycycline without diarrhea ,   Worked right through the illness..  Still coughing despite using tessalon Perles.and albuterol MDI bid.cough is still present and worse at night when supine  .     Recent chest ray was read as RLL infiltrate with interval increase in interstitial prominence and miliary like densities suggesting either infectious or hypersensitivity pneumonitis.   She has a history of COPD , quit smoking in 1993 after her AMI  , started in her 45's    (30 pack years)     Recent labs reviewed,  All normal except for fasting glucose Of 116,  No recent steroid use  Outpatient Medications Prior to Visit  Medication Sig Dispense Refill  . acyclovir (ZOVIRAX) 400 MG tablet Take 1 tablet (400 mg total) by mouth daily. Must keep OV appt 90 tablet 1  . albuterol (PROVENTIL HFA) 108 (90 Base) MCG/ACT inhaler Inhale 2 puffs into the lungs every 6 (six) hours as needed for wheezing or shortness of breath. 1 Inhaler 1  . ALPRAZolam (XANAX) 0.5 MG tablet Take 1 tablet (0.5 mg total) by mouth at bedtime as needed for sleep. 90 tablet 1  . aspirin 81 MG tablet Take 160 mg by mouth daily.      . carbonyl iron (FEOSOL) 45 MG TABS Take one by mouth daily    . Cholecalciferol (VITAMIN D3) 3000 UNITS TABS Take 1 tablet by mouth daily. Take one by mouth daily 90 tablet 3  .  cyanocobalamin 1000 MCG tablet Take one by mouth daily     . Dextromethorphan-Guaifenesin 5-100 MG/5ML LIQD Take 10 mLs by mouth at bedtime as needed (for cough). 1 Bottle 0  . furosemide (LASIX) 20 MG tablet Take 1/2 tablet by mouth daily (10mg  total) Must keep OV appt for further refills 135 tablet 1  . isosorbide mononitrate (IMDUR) 60 MG 24 hr tablet Take 1 tablet (60 mg total) by mouth daily. Must keep OV appt for further refills 90 tablet 1  . magnesium oxide (MAG-OX) 400 MG tablet Take 400 mg by mouth daily.      . Omega-3 Fatty Acids (FISH OIL) 1200 MG CAPS Take one by mouth daily     . pravastatin (PRAVACHOL) 80 MG tablet Take 1 tablet by mouth every day (Office visit needed for further refills) 90 tablet 2  . TIMOLOL MALEATE PF OP Use one drop both eyes 2 times a day     . benzonatate (TESSALON) 100 MG capsule Take 1 capsule (100 mg total) by mouth 2 (two) times daily as needed for cough. (Patient not taking: Reported on 05/20/2016) 20 capsule 0  . doxycycline (VIBRA-TABS) 100 MG tablet Take 1 tablet (100 mg total) by mouth 2 (two) times daily. (Patient not taking: Reported on 05/20/2016) 10 tablet 0  . meclizine (ANTIVERT) 25 MG tablet Take 2 tablets (50 mg total) by mouth 3 (three)  times daily as needed for dizziness or nausea. (Patient not taking: Reported on 05/20/2016) 30 tablet 1   No facility-administered medications prior to visit.     Review of Systems;  Patient denies headache, fevers, malaise, unintentional weight loss, skin rash, eye pain, sinus congestion and sinus pain, sore throat, dysphagia,  hemoptysis , cough, dyspnea, wheezing, chest pain, palpitations, orthopnea, edema, abdominal pain, nausea, melena, diarrhea, constipation, flank pain, dysuria, hematuria, urinary  Frequency, nocturia, numbness, tingling, seizures,  Focal weakness, Loss of consciousness,  Tremor, insomnia, depression, anxiety, and suicidal ideation.      Objective:  BP (!) 160/80   Pulse (!) 52    Temp 97.6 F (36.4 C) (Oral)   Resp 14   Ht 5\' 5"  (1.651 m)   Wt 147 lb 8 oz (66.9 kg)   SpO2 96%   BMI 24.55 kg/m   BP Readings from Last 3 Encounters:  05/20/16 (!) 160/80  05/07/16 124/60  04/24/16 128/80    Wt Readings from Last 3 Encounters:  05/20/16 147 lb 8 oz (66.9 kg)  05/07/16 147 lb 12.8 oz (67 kg)  04/24/16 148 lb 12.8 oz (67.5 kg)    General appearance: alert, cooperative and appears stated age Ears: normal TM's and external ear canals both ears Throat: lips, mucosa, and tongue normal; teeth and gums normal Neck: no adenopathy, no carotid bruit, supple, symmetrical, trachea midline and thyroid not enlarged, symmetric, no tenderness/mass/nodules Back: symmetric, no curvature. ROM normal. No CVA tenderness. Lungs: clear to auscultation bilaterally Heart: regular rate and rhythm, S1, S2 normal, no murmur, click, rub or gallop Abdomen: soft, non-tender; bowel sounds normal; no masses,  no organomegaly Pulses: 2+ and symmetric Skin: Skin color, texture, turgor normal. No rashes or lesions Lymph nodes: Cervical, supraclavicular, and axillary nodes normal.  Lab Results  Component Value Date   HGBA1C 5.7 05/20/2016   HGBA1C 5.7 02/17/2012    Lab Results  Component Value Date   CREATININE 0.98 05/19/2016   CREATININE 0.90 11/14/2015   CREATININE 0.91 04/13/2015    Lab Results  Component Value Date   WBC 8.6 05/19/2016   HGB 13.7 05/19/2016   HCT 39.7 05/19/2016   PLT 201.0 05/19/2016   GLUCOSE 116 (H) 05/19/2016   CHOL 143 05/19/2016   TRIG 62.0 05/19/2016   HDL 48.50 05/19/2016   LDLCALC 82 05/19/2016   ALT 13 05/19/2016   AST 16 05/19/2016   NA 139 05/19/2016   K 4.4 05/19/2016   CL 106 05/19/2016   CREATININE 0.98 05/19/2016   BUN 14 05/19/2016   CO2 29 05/19/2016   TSH 1.86 05/19/2016   INR 1.0 02/16/2012   HGBA1C 5.7 05/20/2016     Assessment & Plan:   Problem List Items Addressed This Visit    Atrial fibrillation ([place])    Rate  controlled,  Not anticoagulated given high fall risk . However, her elevated fasting glucose today raises the possibility of Tpe 2 DM which if present raises her risk of embolic CVA.      COPD (chronic obstructive pulmonary disease) ([place])    Presumed given history of tobacco abuse.  Will send for pulmonary function tests in a few weeks.       Relevant Medications   chlorpheniramine-HYDROcodone (TUSSIONEX PENNKINETIC ER) 10-8 MG/5ML SUER   CKD (chronic kidney disease) stage 3, GFR 30-59 ml/min    She is avoiding use of NSAIDs and her cr is stable .  Sees Nephrology twice annually.   Lab Results  Component  Value Date   CREATININE 0.98 05/19/2016             Pneumonia    Clinically recovered, but chest x ray last week suggested a secondary process . Repeat chest x ray today shows interval clearing of infiltrate      Relevant Medications   chlorpheniramine-HYDROcodone (TUSSIONEX PENNKINETIC ER) 10-8 MG/5ML SUER   Other Relevant Orders   DG Chest 2 View (Completed)   Impaired fasting glucose - Primary    Her random glucose is again elevated but not diagnostic of diabetes . A1c is normal   Lab Results  Component Value Date   HGBA1C 5.7 05/20/2016         Relevant Orders   Hemoglobin A1c (Completed)    Other Visit Diagnoses   None.     I am having Audrey Duran start on chlorpheniramine-HYDROcodone. I am also having her maintain her TIMOLOL MALEATE PF OP, aspirin, carbonyl iron, magnesium oxide, Fish Oil, cyanocobalamin, Vitamin D3, ALPRAZolam, meclizine, pravastatin, isosorbide mononitrate, furosemide, acyclovir, benzonatate, doxycycline, albuterol, and Dextromethorphan-Guaifenesin.  Meds ordered this encounter  Medications  . chlorpheniramine-HYDROcodone (TUSSIONEX PENNKINETIC ER) 10-8 MG/5ML SUER    Sig: Take 5 mLs by mouth at bedtime as needed for cough.    Dispense:  140 mL    Refill:  0    There are no discontinued medications.  Follow-up: Return in about 4  weeks (around 06/17/2016), or BP follow up  with DR Derrel Nip.   Crecencio Mc, MD

## 2016-05-20 NOTE — Progress Notes (Signed)
Pre-visit discussion using our clinic review tool. No additional management support is needed unless otherwise documented below in the visit note.  

## 2016-05-21 DIAGNOSIS — J189 Pneumonia, unspecified organism: Secondary | ICD-10-CM | POA: Insufficient documentation

## 2016-05-21 DIAGNOSIS — R7303 Prediabetes: Secondary | ICD-10-CM | POA: Insufficient documentation

## 2016-05-21 NOTE — Assessment & Plan Note (Signed)
She is avoiding use of NSAIDs and her cr is stable .  Sees Nephrology twice annually.   Lab Results  Component Value Date   CREATININE 0.98 05/19/2016

## 2016-05-21 NOTE — Assessment & Plan Note (Signed)
Presumed given history of tobacco abuse.  Will send for pulmonary function tests in a few weeks.

## 2016-05-21 NOTE — Assessment & Plan Note (Signed)
Rate controlled,  Not anticoagulated given high fall risk . However, her elevated fasting glucose today raises the possibility of Tpe 2 DM which if present raises her risk of embolic CVA.

## 2016-05-21 NOTE — Assessment & Plan Note (Signed)
Her random glucose is again elevated but not diagnostic of diabetes . A1c is normal   Lab Results  Component Value Date   HGBA1C 5.7 05/20/2016

## 2016-05-21 NOTE — Assessment & Plan Note (Addendum)
Clinically recovered, but chest x ray last week suggested a secondary process . Repeat chest x ray today shows interval clearing of infiltrate

## 2016-05-25 ENCOUNTER — Telehealth: Payer: Self-pay | Admitting: *Deleted

## 2016-05-25 NOTE — Telephone Encounter (Signed)
Pt has requested Lab results  Pt contact [phone]

## 2016-05-26 NOTE — Telephone Encounter (Signed)
done

## 2016-06-11 DIAGNOSIS — I5022 Chronic systolic (congestive) heart failure: Secondary | ICD-10-CM | POA: Diagnosis not present

## 2016-06-11 DIAGNOSIS — J410 Simple chronic bronchitis: Secondary | ICD-10-CM | POA: Diagnosis not present

## 2016-06-11 DIAGNOSIS — Z9889 Other specified postprocedural states: Secondary | ICD-10-CM | POA: Diagnosis not present

## 2016-06-11 DIAGNOSIS — I214 Non-ST elevation (NSTEMI) myocardial infarction: Secondary | ICD-10-CM | POA: Diagnosis not present

## 2016-06-11 DIAGNOSIS — I255 Ischemic cardiomyopathy: Secondary | ICD-10-CM | POA: Diagnosis not present

## 2016-06-15 DIAGNOSIS — H40003 Preglaucoma, unspecified, bilateral: Secondary | ICD-10-CM | POA: Diagnosis not present

## 2016-06-22 DIAGNOSIS — H40003 Preglaucoma, unspecified, bilateral: Secondary | ICD-10-CM | POA: Diagnosis not present

## 2016-06-25 ENCOUNTER — Ambulatory Visit (INDEPENDENT_AMBULATORY_CARE_PROVIDER_SITE_OTHER): Payer: PPO

## 2016-06-25 ENCOUNTER — Encounter: Payer: Self-pay | Admitting: Internal Medicine

## 2016-06-25 ENCOUNTER — Ambulatory Visit (INDEPENDENT_AMBULATORY_CARE_PROVIDER_SITE_OTHER): Payer: PPO | Admitting: Internal Medicine

## 2016-06-25 VITALS — BP 116/66 | HR 62 | Temp 97.7°F | Resp 12 | Ht 65.0 in | Wt 148.0 lb

## 2016-06-25 DIAGNOSIS — N183 Chronic kidney disease, stage 3 unspecified: Secondary | ICD-10-CM

## 2016-06-25 DIAGNOSIS — J181 Lobar pneumonia, unspecified organism: Secondary | ICD-10-CM

## 2016-06-25 DIAGNOSIS — J189 Pneumonia, unspecified organism: Secondary | ICD-10-CM

## 2016-06-25 DIAGNOSIS — Z8619 Personal history of other infectious and parasitic diseases: Secondary | ICD-10-CM

## 2016-06-25 DIAGNOSIS — E875 Hyperkalemia: Secondary | ICD-10-CM | POA: Diagnosis not present

## 2016-06-25 NOTE — Patient Instructions (Signed)
I am repeating  Chest x ray to ensure resolution of the Right lower lobe infiltrate that was seen on the prior films

## 2016-06-25 NOTE — Progress Notes (Signed)
Subjective:  Patient ID: Audrey Duran, female    DOB: 06/27/1935  Age: 81 y.o. MRN: [id number]  CC: The primary encounter diagnosis was Pneumonia of right lower lobe due to infectious organism [facility]). Diagnoses of Hyperkalemia, History of Clostridium difficile colitis, and CKD (chronic kidney disease) stage 3, GFR 30-59 ml/min were also pertinent to this visit.  HPI Audrey Duran presents for FOLLOW UP ON RLL  pneumonia diagnosed in late September  and treated with  Multiple rounds of antibiotics.  Feels better but still has a productive cough. Last chest x ray Oct 29 showed interval improvement but not complete clearing of infiltrate and repeat film was advised.  Taking benadryl at night which helps the nocturnal cough,  Some rhinitis and sneezing .  Allegra didn't help.  Quit smoking in 1993    Outpatient Medications Prior to Visit  Medication Sig Dispense Refill  . acyclovir (ZOVIRAX) 400 MG tablet Take 1 tablet (400 mg total) by mouth daily. Must keep OV appt 90 tablet 1  . albuterol (PROVENTIL HFA) 108 (90 Base) MCG/ACT inhaler Inhale 2 puffs into the lungs every 6 (six) hours as needed for wheezing or shortness of breath. 1 Inhaler 1  . ALPRAZolam (XANAX) 0.5 MG tablet Take 1 tablet (0.5 mg total) by mouth at bedtime as needed for sleep. 90 tablet 1  . aspirin 81 MG tablet Take 160 mg by mouth daily.      . carbonyl iron (FEOSOL) 45 MG TABS Take one by mouth daily    . Cholecalciferol (VITAMIN D3) 3000 UNITS TABS Take 1 tablet by mouth daily. Take one by mouth daily 90 tablet 3  . cyanocobalamin 1000 MCG tablet Take one by mouth daily     . furosemide (LASIX) 20 MG tablet Take 1/2 tablet by mouth daily (10mg  total) Must keep OV appt for further refills 135 tablet 1  . isosorbide mononitrate (IMDUR) 60 MG 24 hr tablet Take 1 tablet (60 mg total) by mouth daily. Must keep OV appt for further refills 90 tablet 1  . magnesium oxide (MAG-OX) 400 MG tablet Take 400 mg by mouth daily.       . Omega-3 Fatty Acids (FISH OIL) 1200 MG CAPS Take one by mouth daily     . pravastatin (PRAVACHOL) 80 MG tablet Take 1 tablet by mouth every day (Office visit needed for further refills) 90 tablet 2  . TIMOLOL MALEATE PF OP Use one drop both eyes 2 times a day     . benzonatate (TESSALON) 100 MG capsule Take 1 capsule (100 mg total) by mouth 2 (two) times daily as needed for cough. (Patient not taking: Reported on 06/25/2016) 20 capsule 0  . chlorpheniramine-HYDROcodone (TUSSIONEX PENNKINETIC ER) 10-8 MG/5ML SUER Take 5 mLs by mouth at bedtime as needed for cough. (Patient not taking: Reported on 06/25/2016) 140 mL 0  . Dextromethorphan-Guaifenesin 5-100 MG/5ML LIQD Take 10 mLs by mouth at bedtime as needed (for cough). (Patient not taking: Reported on 06/25/2016) 1 Bottle 0  . meclizine (ANTIVERT) 25 MG tablet Take 2 tablets (50 mg total) by mouth 3 (three) times daily as needed for dizziness or nausea. (Patient not taking: Reported on 06/25/2016) 30 tablet 1  . doxycycline (VIBRA-TABS) 100 MG tablet Take 1 tablet (100 mg total) by mouth 2 (two) times daily. (Patient not taking: Reported on 06/25/2016) 10 tablet 0   No facility-administered medications prior to visit.     Review of Systems;  Patient denies headache, fevers,  malaise, unintentional weight loss, skin rash, eye pain, sinus congestion and sinus pain, sore throat, dysphagia,  hemoptysis ,  dyspnea, wheezing, chest pain, palpitations, orthopnea, edema, abdominal pain, nausea, melena, diarrhea, constipation, flank pain, dysuria, hematuria, urinary  Frequency, nocturia, numbness, tingling, seizures,  Focal weakness, Loss of consciousness,  Tremor, insomnia, depression, anxiety, and suicidal ideation.      Objective:  BP 116/66   Pulse 62   Temp 97.7 F (36.5 C) (Oral)   Resp 12   Ht 5\' 5"  (1.651 m)   Wt 148 lb (67.1 kg)   SpO2 95%   BMI 24.63 kg/m   BP Readings from Last 3 Encounters:  06/25/16 116/66  05/20/16 (!)  160/80  05/07/16 124/60    Wt Readings from Last 3 Encounters:  06/25/16 148 lb (67.1 kg)  05/20/16 147 lb 8 oz (66.9 kg)  05/07/16 147 lb 12.8 oz (67 kg)    General appearance: alert, cooperative and appears stated age Ears: normal TM's and external ear canals both ears Throat: lips, mucosa, and tongue normal; teeth and gums normal Neck: no adenopathy, no carotid bruit, supple, symmetrical, trachea midline and thyroid not enlarged, symmetric, no tenderness/mass/nodules Back: symmetric, no curvature. ROM normal. No CVA tenderness. Lungs: clear to auscultation bilaterally except for rales at right base Heart: regular rate and rhythm, S1, S2 normal, no murmur, click, rub or gallop Abdomen: soft, non-tender; bowel sounds normal; no masses,  no organomegaly Pulses: 2+ and symmetric Skin: Skin color, texture, turgor normal. No rashes or lesions Lymph nodes: Cervical, supraclavicular, and axillary nodes normal.  Lab Results  Component Value Date   HGBA1C 5.7 05/20/2016   HGBA1C 5.7 02/17/2012    Lab Results  Component Value Date   CREATININE 0.98 05/19/2016   CREATININE 0.90 11/14/2015   CREATININE 0.91 04/13/2015    Lab Results  Component Value Date   WBC 8.6 05/19/2016   HGB 13.7 05/19/2016   HCT 39.7 05/19/2016   PLT 201.0 05/19/2016   GLUCOSE 116 (H) 05/19/2016   CHOL 143 05/19/2016   TRIG 62.0 05/19/2016   HDL 48.50 05/19/2016   LDLCALC 82 05/19/2016   ALT 13 05/19/2016   AST 16 05/19/2016   NA 139 05/19/2016   K 4.4 05/19/2016   CL 106 05/19/2016   CREATININE 0.98 05/19/2016   BUN 14 05/19/2016   CO2 29 05/19/2016   TSH 1.86 05/19/2016   INR 1.0 02/16/2012   HGBA1C 5.7 05/20/2016    Mm Screening Breast Tomo Bilateral  Result Date: 03/20/2016 CLINICAL DATA:  Screening. EXAM: 2D DIGITAL SCREENING BILATERAL MAMMOGRAM WITH CAD AND ADJUNCT TOMO COMPARISON:  Previous exam(s). ACR Breast Density Category c: The breast tissue is heterogeneously dense, which may  obscure small masses. FINDINGS: There are no findings suspicious for malignancy. Images were processed with CAD. IMPRESSION: No mammographic evidence of malignancy. A result letter of this screening mammogram will be mailed directly to the patient. RECOMMENDATION: Screening mammogram in one year. (Code:SM-B-01Y) BI-RADS CATEGORY  1: Negative. Electronically Signed   By: Nolon Nations M.D.   On: 03/20/2016 10:14    Assessment & Plan:   Problem List Items Addressed This Visit    CKD (chronic kidney disease) stage 3, GFR 30-59 ml/min    She is avoiding use of NSAIDs and her cr is stable .  Sees Nephrology twice annually.   Lab Results  Component Value Date   CREATININE 0.98 05/19/2016             History  of Clostridium difficile colitis    She has been advised to continue probiotics indefinitely      RESOLVED: Hyperkalemia   Pneumonia - Primary     Still having cough productive of nonpurulent sputum.  Exam notable for rales right base.  repeat Chest x ray done today shows complete  resolution of the Right lower lobe infiltrate that was seen on the prior films       Relevant Orders   DG Chest 2 View (Completed)      I have discontinued Ms. Lenderman's doxycycline. I am also having her maintain her TIMOLOL MALEATE PF OP, aspirin, carbonyl iron, magnesium oxide, Fish Oil, cyanocobalamin, Vitamin D3, ALPRAZolam, meclizine, pravastatin, isosorbide mononitrate, furosemide, acyclovir, benzonatate, albuterol, Dextromethorphan-Guaifenesin, and chlorpheniramine-HYDROcodone.  No orders of the defined types were placed in this encounter.   Medications Discontinued During This Encounter  Medication Reason  . doxycycline (VIBRA-TABS) 100 MG tablet     Follow-up: Return in about 6 months (around 12/23/2016).   Crecencio Mc, MD

## 2016-06-25 NOTE — Assessment & Plan Note (Addendum)
Still having cough productive of nonpurulent sputum.  Exam notable for rales right base.  repeat Chest x ray done today shows complete  resolution of the Right lower lobe infiltrate that was seen on the prior films

## 2016-06-25 NOTE — Progress Notes (Signed)
Pre-visit discussion using our clinic review tool. No additional management support is needed unless otherwise documented below in the visit note.  

## 2016-06-28 ENCOUNTER — Encounter: Payer: Self-pay | Admitting: Internal Medicine

## 2016-06-28 NOTE — Assessment & Plan Note (Addendum)
She has been advised to continue probiotics indefinitely

## 2016-06-28 NOTE — Assessment & Plan Note (Signed)
She is avoiding use of NSAIDs and her cr is stable .  Sees Nephrology twice annually.   Lab Results  Component Value Date   CREATININE 0.98 05/19/2016

## 2016-07-09 ENCOUNTER — Telehealth: Payer: Self-pay | Admitting: Internal Medicine

## 2016-07-09 ENCOUNTER — Other Ambulatory Visit: Payer: Self-pay | Admitting: *Deleted

## 2016-07-09 DIAGNOSIS — B009 Herpesviral infection, unspecified: Secondary | ICD-10-CM

## 2016-07-09 MED ORDER — ACYCLOVIR 400 MG PO TABS: 400.0000 mg | ORAL_TABLET | Freq: Every day | ORAL | 1 refills | Status: DC

## 2016-07-09 MED ORDER — ISOSORBIDE MONONITRATE ER 60 MG PO TB24: 60.0000 mg | ORAL_TABLET | Freq: Every day | ORAL | 1 refills | Status: DC

## 2016-07-09 MED ORDER — PRAVASTATIN SODIUM 80 MG PO TABS: ORAL_TABLET | ORAL | 2 refills | Status: DC

## 2016-07-09 MED ORDER — FUROSEMIDE 20 MG PO TABS: ORAL_TABLET | ORAL | 3 refills | Status: DC

## 2016-07-09 NOTE — Telephone Encounter (Signed)
Refill sent as requested. 

## 2016-07-09 NOTE — Telephone Encounter (Signed)
Pt called about needing a refill for pravastatin (PRAVACHOL) 80 MG tablet, furosemide (LASIX) 20 MG tablet, isosorbide mononitrate (IMDUR) 60 MG 24 hr tablet and acyclovir (ZOVIRAX) 400 MG tablet.  Pharmacy is [facility], [place].  Call pt @ [phone] Thank you!

## 2016-07-10 MED ORDER — ACYCLOVIR 400 MG PO TABS: 400.0000 mg | ORAL_TABLET | Freq: Every day | ORAL | 1 refills | Status: DC

## 2016-10-08 DIAGNOSIS — I255 Ischemic cardiomyopathy: Secondary | ICD-10-CM | POA: Diagnosis not present

## 2016-10-08 DIAGNOSIS — I214 Non-ST elevation (NSTEMI) myocardial infarction: Secondary | ICD-10-CM | POA: Diagnosis not present

## 2016-10-08 DIAGNOSIS — Z9889 Other specified postprocedural states: Secondary | ICD-10-CM | POA: Diagnosis not present

## 2016-10-08 DIAGNOSIS — J449 Chronic obstructive pulmonary disease, unspecified: Secondary | ICD-10-CM | POA: Diagnosis not present

## 2016-10-08 DIAGNOSIS — I729 Aneurysm of unspecified site: Secondary | ICD-10-CM | POA: Diagnosis not present

## 2016-10-08 DIAGNOSIS — I5022 Chronic systolic (congestive) heart failure: Secondary | ICD-10-CM | POA: Diagnosis not present

## 2016-11-16 ENCOUNTER — Ambulatory Visit (INDEPENDENT_AMBULATORY_CARE_PROVIDER_SITE_OTHER): Payer: PPO | Admitting: Internal Medicine

## 2016-11-16 ENCOUNTER — Encounter: Payer: Self-pay | Admitting: Internal Medicine

## 2016-11-16 VITALS — BP 120/60 | HR 51 | Temp 97.5°F | Resp 15 | Ht 65.0 in | Wt 144.0 lb

## 2016-11-16 DIAGNOSIS — F411 Generalized anxiety disorder: Secondary | ICD-10-CM | POA: Diagnosis not present

## 2016-11-16 DIAGNOSIS — I480 Paroxysmal atrial fibrillation: Secondary | ICD-10-CM

## 2016-11-16 DIAGNOSIS — Z1231 Encounter for screening mammogram for malignant neoplasm of breast: Secondary | ICD-10-CM

## 2016-11-16 DIAGNOSIS — R7301 Impaired fasting glucose: Secondary | ICD-10-CM

## 2016-11-16 DIAGNOSIS — I10 Essential (primary) hypertension: Secondary | ICD-10-CM

## 2016-11-16 DIAGNOSIS — R300 Dysuria: Secondary | ICD-10-CM

## 2016-11-16 DIAGNOSIS — E2839 Other primary ovarian failure: Secondary | ICD-10-CM

## 2016-11-16 DIAGNOSIS — R7303 Prediabetes: Secondary | ICD-10-CM

## 2016-11-16 DIAGNOSIS — Z79899 Other long term (current) drug therapy: Secondary | ICD-10-CM | POA: Diagnosis not present

## 2016-11-16 DIAGNOSIS — N183 Chronic kidney disease, stage 3 unspecified: Secondary | ICD-10-CM

## 2016-11-16 DIAGNOSIS — Z1239 Encounter for other screening for malignant neoplasm of breast: Secondary | ICD-10-CM

## 2016-11-16 DIAGNOSIS — I2510 Atherosclerotic heart disease of native coronary artery without angina pectoris: Secondary | ICD-10-CM

## 2016-11-16 LAB — CBC WITH DIFFERENTIAL/PLATELET
Basophils Absolute: 0.1 10*3/uL (ref 0.0–0.1)
Basophils Relative: 0.9 % (ref 0.0–3.0)
EOS PCT: 2.7 % (ref 0.0–5.0)
Eosinophils Absolute: 0.2 10*3/uL (ref 0.0–0.7)
HCT: 42.5 % (ref 36.0–46.0)
Hemoglobin: 14 g/dL (ref 12.0–15.0)
LYMPHS ABS: 2.4 10*3/uL (ref 0.7–4.0)
Lymphocytes Relative: 27.3 % (ref 12.0–46.0)
MCHC: 32.8 g/dL (ref 30.0–36.0)
MCV: 92.6 fl (ref 78.0–100.0)
MONOS PCT: 7.8 % (ref 3.0–12.0)
Monocytes Absolute: 0.7 10*3/uL (ref 0.1–1.0)
NEUTROS ABS: 5.4 10*3/uL (ref 1.4–7.7)
NEUTROS PCT: 61.3 % (ref 43.0–77.0)
PLATELETS: 226 10*3/uL (ref 150.0–400.0)
RBC: 4.59 Mil/uL (ref 3.87–5.11)
RDW: 13.5 % (ref 11.5–15.5)
WBC: 8.7 10*3/uL (ref 4.0–10.5)

## 2016-11-16 LAB — LIPID PANEL
CHOL/HDL RATIO: 3
Cholesterol: 146 mg/dL (ref 0–200)
HDL: 55.3 mg/dL (ref >39.00)
LDL CALC: 75 mg/dL (ref 0–99)
NonHDL: 91.1
TRIGLYCERIDES: 83 mg/dL (ref 0.0–149.0)
VLDL: 16.6 mg/dL (ref 0.0–40.0)

## 2016-11-16 LAB — COMPREHENSIVE METABOLIC PANEL
ALT: 16 U/L (ref 0–35)
AST: 17 U/L (ref 0–37)
Albumin: 4.1 g/dL (ref 3.5–5.2)
Alkaline Phosphatase: 68 U/L (ref 39–117)
BUN: 19 mg/dL (ref 6–23)
CALCIUM: 10 mg/dL (ref 8.4–10.5)
CHLORIDE: 103 meq/L (ref 96–112)
CO2: 30 meq/L (ref 19–32)
CREATININE: 1.12 mg/dL (ref 0.40–1.20)
GFR: 49.83 mL/min — ABNORMAL LOW (ref >60.00)
Glucose, Bld: 102 mg/dL — ABNORMAL HIGH (ref 70–99)
POTASSIUM: 3.9 meq/L (ref 3.5–5.1)
Sodium: 139 mEq/L (ref 135–145)
Total Bilirubin: 0.4 mg/dL (ref 0.2–1.2)
Total Protein: 7.1 g/dL (ref 6.0–8.3)

## 2016-11-16 LAB — HEMOGLOBIN A1C: Hgb A1c MFr Bld: 6.1 % (ref 4.6–6.5)

## 2016-11-16 LAB — POCT URINALYSIS DIPSTICK
BILIRUBIN UA: NEGATIVE
GLUCOSE UA: NEGATIVE
Ketones, UA: NEGATIVE
NITRITE UA: NEGATIVE
Protein, UA: NEGATIVE
RBC UA: NEGATIVE
Urobilinogen, UA: 0.2 E.U./dL
pH, UA: 7 (ref 5.0–8.0)

## 2016-11-16 LAB — URINALYSIS, MICROSCOPIC ONLY
RBC / HPF: NONE SEEN (ref 0–?)
WBC, UA: NONE SEEN (ref 0–?)

## 2016-11-16 LAB — TSH: TSH: 2.81 u[IU]/mL (ref 0.35–4.50)

## 2016-11-16 MED ORDER — ALPRAZOLAM 0.5 MG PO TABS: 0.5000 mg | ORAL_TABLET | Freq: Every evening | ORAL | 5 refills | Status: DC | PRN

## 2016-11-16 NOTE — Progress Notes (Signed)
Patient ID: Audrey Duran, female    DOB: [birth year]  Age: 82 y.o. MRN: [id number]  The patient is here for follow up  and management of other chronic and acute problems.  Screening exams reviewed:  Mammogram due March 20 2017 Colon ca screening due  Last colonoscopy 2006. Discussed cologuard DEXA ordered    Lab Results  Component Value Date   HGBA1C 6.1 11/16/2016      The risk factors are reflected in the social history.  The roster of all physicians providing medical care to patient - is listed in the Snapshot section of the chart.  Activities of daily living:  The patient is 100% independent in all ADLs: dressing, toileting, feeding as well as independent mobility  Home safety : The patient has smoke detectors in the home. They wear seatbelts.  There are no firearms at home. There is no violence in the home.   There is no risks for hepatitis, STDs or HIV. There is no   history of blood transfusion. They have no travel history to infectious disease endemic areas of the world.  The patient has seen their dentist in the last six month. They have seen their eye doctor in the last year. They admit to slight hearing difficulty with regard to whispered voices and some television programs.  They have deferred audiologic testing in the last year.  They do not  have excessive sun exposure. Discussed the need for sun protection: hats, long sleeves and use of sunscreen if there is significant sun exposure.   Diet: the importance of a healthy diet is discussed. They do have a healthy diet.  The benefits of regular aerobic exercise were discussed. She walks 4 times per week ,  20 minutes.   Depression screen: there are no signs or vegative symptoms of depression- irritability, change in appetite, anhedonia, sadness/tearfullness.  Cognitive assessment: the patient manages all their financial and personal affairs and is actively engaged. They could relate day,date,year and events; recalled  2/3 objects at 3 minutes; performed clock-face test normally.  The following portions of the patient's history were reviewed and updated as appropriate: allergies, current medications, past family history, past medical history,  past surgical history, past social history  and problem list.  Visual acuity was not assessed per patient preference since she has regular follow up with her ophthalmologist. Hearing and body mass index were assessed and reviewed.   During the course of the visit the patient was educated and counseled about appropriate screening and preventive services including : fall prevention , diabetes screening, nutrition counseling, colorectal cancer screening, and recommended immunizations.    CC: The primary encounter diagnosis was Estrogen deficiency. Diagnoses of Impaired fasting glucose, Paroxysmal atrial fibrillation (HCC), Essential hypertension, Coronary artery disease involving native coronary artery of native heart without angina pectoris, Dysuria, Screening for breast cancer, Prediabetes, Anxiety state, and Long-term use of high-risk medication were also pertinent to this visit.  History of c dif colitis,  Treated to resolution without complications.  Denies any recent diarrhea,    HAS OCCASIONAL VAGINAL ITCHING occurring about ONCE A WEEK , WHICH IS CHRONIC INVOLVING THE LABIA.  USING VASELINE   BROTHER has been diagnosed with CHF and sick sinus syndrome.  Worried about him.  Wants refill on xanax.   Has been having dysuria and a "heavy feeling in her bladder"   Had a normal stress test recenlty ,  Normal (for evaluation of chest pain) in November Atrial fib,  Previously on warfarin  which was stopped over 5 years ago.  History of  hip fracture 4 years ago   Taking losartan prescribed by cardiologist AP 3 weeks ago , has not had BMET yet  Despite taking it for 3 weeks !! Dose was reduced from 50 mg to 25 mg due to intolerance.   History Larcenia has a past medical  history of Anemia; Atrial fibrillation ([place]); COPD (chronic obstructive pulmonary disease) ([place]); Coronary artery disease (1995); Hyperlipidemia; Ischemic cardiomyopathy (Dec 2011); Myocardial infarction ([place]); and Recurrent HSV (herpes simplex virus).   She has a past surgical history that includes Cataract extraction (2004, 2006); Carotid endarterectomy (July 2007); Joint replacement (July 2013); Abdominal hysterectomy (june 2014); and Breast biopsy ([place]).   Her family history includes Breast cancer (age of onset: 30) in her sister; Cancer (age of onset: 33) in her sister; Cancer (age of onset: 74) in her mother; Heart disease in her father.She reports that she quit smoking about 24 years ago. She has never used smokeless tobacco. She reports that she does not drink alcohol or use drugs.  Outpatient Medications Prior to Visit  Medication Sig Dispense Refill  . acyclovir (ZOVIRAX) 400 MG tablet Take 1 tablet (400 mg total) by mouth daily. Must keep OV appt 90 tablet 1  . albuterol (PROVENTIL HFA) 108 (90 Base) MCG/ACT inhaler Inhale 2 puffs into the lungs every 6 (six) hours as needed for wheezing or shortness of breath. 1 Inhaler 1  . aspirin 81 MG tablet Take 160 mg by mouth daily.      . benzonatate (TESSALON) 100 MG capsule Take 1 capsule (100 mg total) by mouth 2 (two) times daily as needed for cough. 20 capsule 0  . carbonyl iron (FEOSOL) 45 MG TABS Take one by mouth daily    . chlorpheniramine-HYDROcodone (TUSSIONEX PENNKINETIC ER) 10-8 MG/5ML SUER Take 5 mLs by mouth at bedtime as needed for cough. 140 mL 0  . Cholecalciferol (VITAMIN D3) 3000 UNITS TABS Take 1 tablet by mouth daily. Take one by mouth daily 90 tablet 3  . cyanocobalamin 1000 MCG tablet Take one by mouth daily     . Dextromethorphan-Guaifenesin 5-100 MG/5ML LIQD Take 10 mLs by mouth at bedtime as needed (for cough). 1 Bottle 0  . furosemide (LASIX) 20 MG tablet Take 1/2 tablet by mouth daily (10mg  total) 90 tablet 3  .  isosorbide mononitrate (IMDUR) 60 MG 24 hr tablet Take 1 tablet (60 mg total) by mouth daily. 90 tablet 1  . magnesium oxide (MAG-OX) 400 MG tablet Take 400 mg by mouth daily.      . meclizine (ANTIVERT) 25 MG tablet Take 2 tablets (50 mg total) by mouth 3 (three) times daily as needed for dizziness or nausea. 30 tablet 1  . Omega-3 Fatty Acids (FISH OIL) 1200 MG CAPS Take one by mouth daily     . pravastatin (PRAVACHOL) 80 MG tablet Take 1 tablet by mouth every day (Office visit needed for further refills) 90 tablet 2  . TIMOLOL MALEATE PF OP Use one drop both eyes 2 times a day     . ALPRAZolam (XANAX) 0.5 MG tablet Take 1 tablet (0.5 mg total) by mouth at bedtime as needed for sleep. 90 tablet 1  . acyclovir (ZOVIRAX) 400 MG tablet Take 1 tablet (400 mg total) by mouth daily. (Patient not taking: Reported on 11/16/2016) 90 tablet 1   No facility-administered medications prior to visit.     Review of Systems   Patient denies headache,  fevers, malaise, unintentional weight loss, skin rash, eye pain, sinus congestion and sinus pain, sore throat, dysphagia,  hemoptysis , cough, dyspnea, wheezing, chest pain, palpitations, orthopnea, edema, abdominal pain, nausea, melena, diarrhea, constipation, flank pain, dysuria, hematuria, urinary  Frequency, nocturia, numbness, tingling, seizures,  Focal weakness, Loss of consciousness,  Tremor, insomnia, depression, anxiety, and suicidal ideation.      Objective:  BP 120/60   Pulse (!) 51   Temp 97.5 F (36.4 C) (Oral)   Resp 15   Ht 5\' 5"  (1.651 m)   Wt 144 lb (65.3 kg)   SpO2 94%   BMI 23.96 kg/m   Physical Exam   General appearance: alert, cooperative and appears stated age Head: Normocephalic, without obvious abnormality, atraumatic Eyes: conjunctivae/corneas clear. PERRL, EOM's intact. Fundi benign. Ears: normal TM's and external ear canals both ears Nose: Nares normal. Septum midline. Mucosa normal. No drainage or sinus  tenderness. Throat: lips, mucosa, and tongue normal; teeth and gums normal Neck: no adenopathy, no carotid bruit, no JVD, supple, symmetrical, trachea midline and thyroid not enlarged, symmetric, no tenderness/mass/nodules Lungs: clear to auscultation bilaterally Breasts: normal appearance, no masses or tenderness Heart: regular rate and rhythm, S1, S2 normal, no murmur, click, rub or gallop Abdomen: soft, non-tender; bowel sounds normal; no masses,  no organomegaly Extremities: extremities normal, atraumatic, no cyanosis or edema Pulses: 2+ and symmetric Skin: Skin color, texture, turgor normal. No rashes or lesions Neurologic: Alert and oriented X 3, normal strength and tone. Normal symmetric reflexes. Normal coordination and gait.      Assessment & Plan:   Problem List Items Addressed This Visit    Anxiety state    Refill on alprazolam for prn use. The risks and benefits of benzodiazepine use were reviewed with patient today including excessive sedation leading to respiratory depression,  impaired thinking/driving, and addiction.  Patient was advised to avoid concurrent use with alcohol, to use medication only as needed and not to share with others  .       Relevant Medications   ALPRAZolam (XANAX) 0.5 MG tablet   Coronary artery disease   Relevant Medications   losartan (COZAAR) 25 MG tablet   Long-term use of high-risk medication    Renal function and potassium check due given initiation of arb. Liver enzymes due as well for monitoring of statin therapy .  Lab Results  Component Value Date   CREATININE 1.12 11/16/2016   Lab Results  Component Value Date   NA 139 11/16/2016   K 3.9 11/16/2016   CL 103 11/16/2016   CO2 30 11/16/2016   Lab Results  Component Value Date   ALT 16 11/16/2016   AST 17 11/16/2016   ALKPHOS 68 11/16/2016   BILITOT 0.4 11/16/2016         Paroxysmal atrial fibrillation (HCC)    Exam is normal today and today's egd has been reviewed and is   c /w sinus bradycardia       Relevant Medications   losartan (COZAAR) 25 MG tablet   Other Relevant Orders   EKG 12-Lead (Completed)   CBC with Differential/Platelet (Completed)   Prediabetes    LAST A1C WAS 5.7  RECHECK TODAY SUGGESTS PREDIABETES  Lab Results  Component Value Date   HGBA1C 6.1 11/16/2016         Screening for breast cancer    Breast exam done, Recommend continuining her annual mammogram        Other Visit Diagnoses    Estrogen deficiency    -  Primary   Relevant Orders   DG Bone Density   Lipid panel (Completed)   Essential hypertension       Relevant Medications   losartan (COZAAR) 25 MG tablet   Other Relevant Orders   Comprehensive metabolic panel (Completed)   TSH (Completed)   Dysuria       Relevant Orders   POCT urinalysis dipstick (Completed)   Urine Microscopic Only (Completed)   Urine culture (Completed)      I am having Ms. Tiberio maintain her TIMOLOL MALEATE PF OP, aspirin, carbonyl iron, magnesium oxide, Fish Oil, cyanocobalamin, Vitamin D3, meclizine, benzonatate, albuterol, Dextromethorphan-Guaifenesin, chlorpheniramine-HYDROcodone, isosorbide mononitrate, furosemide, pravastatin, acyclovir, losartan, and ALPRAZolam.  Meds ordered this encounter  Medications  . losartan (COZAAR) 25 MG tablet    Sig: Take 25 mg by mouth.   . ALPRAZolam (XANAX) 0.5 MG tablet    Sig: Take 1 tablet (0.5 mg total) by mouth at bedtime as needed for sleep.    Dispense:  30 tablet    Refill:  5    90 Day supply  A total of 40 minutes was spent with patient more than half of which was spent in counseling patient on the above mentioned issues , reviewing and explaining recent labs and imaging studies done, and coordination of care.  Medications Discontinued During This Encounter  Medication Reason  . acyclovir (ZOVIRAX) 956 MG tablet Duplicate  . ALPRAZolam (XANAX) 0.5 MG tablet Reorder    Follow-up: No Follow-up on file.   Crecencio Mc,  MD

## 2016-11-16 NOTE — Progress Notes (Signed)
Pre visit review using our clinic review tool, if applicable. No additional management support is needed unless otherwise documented below in the visit note. 

## 2016-11-16 NOTE — Assessment & Plan Note (Addendum)
LAST A1C WAS 5.7  RECHECK TODAY SUGGESTS PREDIABETES  Lab Results  Component Value Date   HGBA1C 6.1 11/16/2016

## 2016-11-16 NOTE — Patient Instructions (Addendum)
You are not in atrial fibrillation currently.  If you develop an irregular heart beat, or please contact us or Dr Saralyn Pilar ASAP  Health Maintenance for Postmenopausal Women Menopause is a normal process in which your reproductive ability comes to an end. This process happens gradually over a span of months to years, usually between the ages of 36 and 13. Menopause is complete when you have missed 12 consecutive menstrual periods. It is important to talk with your health care provider about some of the most common conditions that affect postmenopausal women, such as heart disease, cancer, and bone loss (osteoporosis). Adopting a healthy lifestyle and getting preventive care can help to promote your health and wellness. Those actions can also lower your chances of developing some of these common conditions. What should I know about menopause? During menopause, you may experience a number of symptoms, such as:  Moderate-to-severe hot flashes.  Night sweats.  Decrease in sex drive.  Mood swings.  Headaches.  Tiredness.  Irritability.  Memory problems.  Insomnia. Choosing to treat or not to treat menopausal changes is an individual decision that you make with your health care provider. What should I know about hormone replacement therapy and supplements? Hormone therapy products are effective for treating symptoms that are associated with menopause, such as hot flashes and night sweats. Hormone replacement carries certain risks, especially as you become older. If you are thinking about using estrogen or estrogen with progestin treatments, discuss the benefits and risks with your health care provider. What should I know about heart disease and stroke? Heart disease, heart attack, and stroke become more likely as you age. This may be due, in part, to the hormonal changes that your body experiences during menopause. These can affect how your body processes dietary fats, triglycerides, and  cholesterol. Heart attack and stroke are both medical emergencies. There are many things that you can do to help prevent heart disease and stroke:  Have your blood pressure checked at least every 1-2 years. High blood pressure causes heart disease and increases the risk of stroke.  If you are 9-59 years old, ask your health care provider if you should take aspirin to prevent a heart attack or a stroke.  Do not use any tobacco products, including cigarettes, chewing tobacco, or electronic cigarettes. If you need help quitting, ask your health care provider.  It is important to eat a healthy diet and maintain a healthy weight.  Be sure to include plenty of vegetables, fruits, low-fat dairy products, and lean protein.  Avoid eating foods that are high in solid fats, added sugars, or salt (sodium).  Get regular exercise. This is one of the most important things that you can do for your health.  Try to exercise for at least 150 minutes each week. The type of exercise that you do should increase your heart rate and make you sweat. This is known as moderate-intensity exercise.  Try to do strengthening exercises at least twice each week. Do these in addition to the moderate-intensity exercise.  Know your numbers.Ask your health care provider to check your cholesterol and your blood glucose. Continue to have your blood tested as directed by your health care provider. What should I know about cancer screening? There are several types of cancer. Take the following steps to reduce your risk and to catch any cancer development as early as possible. Breast Cancer  Practice breast self-awareness.  This means understanding how your breasts normally appear and feel.  It also means  doing regular breast self-exams. Let your health care provider know about any changes, no matter how small.  If you are 32 or older, have a clinician do a breast exam (clinical breast exam or CBE) every year. Depending on  your age, family history, and medical history, it may be recommended that you also have a yearly breast X-ray (mammogram).  If you have a family history of breast cancer, talk with your health care provider about genetic screening.  If you are at high risk for breast cancer, talk with your health care provider about having an MRI and a mammogram every year.  Breast cancer (BRCA) gene test is recommended for women who have family members with BRCA-related cancers. Results of the assessment will determine the need for genetic counseling and BRCA1 and for BRCA2 testing. BRCA-related cancers include these types:  Breast. This occurs in males or females.  Ovarian.  Tubal. This may also be called fallopian tube cancer.  Cancer of the abdominal or pelvic lining (peritoneal cancer).  Prostate.  Pancreatic. Cervical, Uterine, and Ovarian Cancer  Your health care provider may recommend that you be screened regularly for cancer of the pelvic organs. These include your ovaries, uterus, and vagina. This screening involves a pelvic exam, which includes checking for microscopic changes to the surface of your cervix (Pap test).  For women ages 21-65, health care providers may recommend a pelvic exam and a Pap test every three years. For women ages 87-65, they may recommend the Pap test and pelvic exam, combined with testing for human papilloma virus (HPV), every five years. Some types of HPV increase your risk of cervical cancer. Testing for HPV may also be done on women of any age who have unclear Pap test results.  Other health care providers may not recommend any screening for nonpregnant women who are considered low risk for pelvic cancer and have no symptoms. Ask your health care provider if a screening pelvic exam is right for you.  If you have had past treatment for cervical cancer or a condition that could lead to cancer, you need Pap tests and screening for cancer for at least 20 years after your  treatment. If Pap tests have been discontinued for you, your risk factors (such as having a new sexual partner) need to be reassessed to determine if you should start having screenings again. Some women have medical problems that increase the chance of getting cervical cancer. In these cases, your health care provider may recommend that you have screening and Pap tests more often.  If you have a family history of uterine cancer or ovarian cancer, talk with your health care provider about genetic screening.  If you have vaginal bleeding after reaching menopause, tell your health care provider.  There are currently no reliable tests available to screen for ovarian cancer. Lung Cancer  Lung cancer screening is recommended for adults 60-31 years old who are at high risk for lung cancer because of a history of smoking. A yearly low-dose CT scan of the lungs is recommended if you:  Currently smoke.  Have a history of at least 30 pack-years of smoking and you currently smoke or have quit within the past 15 years. A pack-year is smoking an average of one pack of cigarettes per day for one year. Yearly screening should:  Continue until it has been 15 years since you quit.  Stop if you develop a health problem that would prevent you from having lung cancer treatment. Colorectal Cancer  This type of cancer can be detected and can often be prevented.  Routine colorectal cancer screening usually begins at age 66 and continues through age 73.  If you have risk factors for colon cancer, your health care provider may recommend that you be screened at an earlier age.  If you have a family history of colorectal cancer, talk with your health care provider about genetic screening.  Your health care provider may also recommend using home test kits to check for hidden blood in your stool.  A small camera at the end of a tube can be used to examine your colon directly (sigmoidoscopy or colonoscopy). This is  done to check for the earliest forms of colorectal cancer.  Direct examination of the colon should be repeated every 5-10 years until age 31. However, if early forms of precancerous polyps or small growths are found or if you have a family history or genetic risk for colorectal cancer, you may need to be screened more often. Skin Cancer  Check your skin from head to toe regularly.  Monitor any moles. Be sure to tell your health care provider:  About any new moles or changes in moles, especially if there is a change in a mole's shape or color.  If you have a mole that is larger than the size of a pencil eraser.  If any of your family members has a history of skin cancer, especially at a young age, talk with your health care provider about genetic screening.  Always use sunscreen. Apply sunscreen liberally and repeatedly throughout the day.  Whenever you are outside, protect yourself by wearing long sleeves, pants, a wide-brimmed hat, and sunglasses. What should I know about osteoporosis? Osteoporosis is a condition in which bone destruction happens more quickly than new bone creation. After menopause, you may be at an increased risk for osteoporosis. To help prevent osteoporosis or the bone fractures that can happen because of osteoporosis, the following is recommended:  If you are 72-36 years old, get at least 1,000 mg of calcium and at least 600 mg of vitamin D per day.  If you are older than age 20 but younger than age 90, get at least 1,200 mg of calcium and at least 600 mg of vitamin D per day.  If you are older than age 5, get at least 1,200 mg of calcium and at least 800 mg of vitamin D per day. Smoking and excessive alcohol intake increase the risk of osteoporosis. Eat foods that are rich in calcium and vitamin D, and do weight-bearing exercises several times each week as directed by your health care provider. What should I know about how menopause affects my mental  health? Depression may occur at any age, but it is more common as you become older. Common symptoms of depression include:  Low or sad mood.  Changes in sleep patterns.  Changes in appetite or eating patterns.  Feeling an overall lack of motivation or enjoyment of activities that you previously enjoyed.  Frequent crying spells. Talk with your health care provider if you think that you are experiencing depression. What should I know about immunizations? It is important that you get and maintain your immunizations. These include:  Tetanus, diphtheria, and pertussis (Tdap) booster vaccine.  Influenza every year before the flu season begins.  Pneumonia vaccine.  Shingles vaccine. Your health care provider may also recommend other immunizations. This information is not intended to replace advice given to you by your health care provider. Make  sure you discuss any questions you have with your health care provider. Document Released: 09/04/2005 Document Revised: 01/31/2016 Document Reviewed: 04/16/2015 Elsevier Interactive Patient Education  2017 [employer].

## 2016-11-16 NOTE — Assessment & Plan Note (Addendum)
Exam is normal today and today's egd has been reviewed and is  c /w sinus bradycardia

## 2016-11-17 LAB — URINE CULTURE

## 2016-11-17 NOTE — Assessment & Plan Note (Signed)
She is avoiding use of NSAIDs and her cr is stable .  Sees Nephrology twice annually.   Lab Results  Component Value Date   CREATININE 1.12 11/16/2016

## 2016-11-17 NOTE — Assessment & Plan Note (Addendum)
Refill on alprazolam for prn use. The risks and benefits of benzodiazepine use were reviewed with patient today including excessive sedation leading to respiratory depression,  impaired thinking/driving, and addiction.  Patient was advised to avoid concurrent use with alcohol, to use medication only as needed and not to share with others  .

## 2016-11-17 NOTE — Assessment & Plan Note (Addendum)
Breast exam done, Recommend continuining her annual mammogram

## 2016-11-17 NOTE — Assessment & Plan Note (Signed)
SHE HAS A HEAVY FEELING currently.  Checking urine today

## 2016-11-17 NOTE — Assessment & Plan Note (Signed)
Renal function and potassium check due given initiation of arb. Liver enzymes due as well for monitoring of statin therapy .  Lab Results  Component Value Date   CREATININE 1.12 11/16/2016   Lab Results  Component Value Date   NA 139 11/16/2016   K 3.9 11/16/2016   CL 103 11/16/2016   CO2 30 11/16/2016   Lab Results  Component Value Date   ALT 16 11/16/2016   AST 17 11/16/2016   ALKPHOS 68 11/16/2016   BILITOT 0.4 11/16/2016

## 2016-11-19 ENCOUNTER — Telehealth: Payer: Self-pay

## 2016-11-19 DIAGNOSIS — R7303 Prediabetes: Secondary | ICD-10-CM

## 2016-11-19 DIAGNOSIS — E785 Hyperlipidemia, unspecified: Secondary | ICD-10-CM

## 2016-11-19 NOTE — Telephone Encounter (Signed)
Spoke with pt and informed her of her lab results. Pt gave a verbal understanding. Also scheduled pt both an office visit and a fasting lab appt. Pt is aware of appt date and time.

## 2016-11-19 NOTE — Telephone Encounter (Signed)
-----   Message from Crecencio Mc, MD sent at 11/17/2016  9:20 PM EDT ----- Your cholesterol, thyroid, cbc,  liver and kidney function are normal.  You have no signs of  a UTI.  Your  fasting glucose has never been  diagnostic of diabetes; but your A1c suggests that  you are at risk for developing type 2 Diabetes.     I recommend  Cutting back on desserts and starches and when your hip/knee allows, regular participation in a walking program for exercise .  I would like to see you again after we repeat your fasting labs in 6 months.    Regards,   Dr. Derrel Nip

## 2016-11-26 ENCOUNTER — Telehealth: Payer: Self-pay | Admitting: Internal Medicine

## 2016-11-26 MED ORDER — ALPRAZOLAM 0.5 MG PO TABS: 0.5000 mg | ORAL_TABLET | Freq: Every evening | ORAL | 1 refills | Status: DC | PRN

## 2016-11-26 NOTE — Telephone Encounter (Signed)
I will  Allow a  90 day refill of the alprazolam  If the quantity for 90 days is 30 tablets as  Stated on the order .  So once you confirm with the :"wrong" pharmacy that the previous rx was not picked up,  Cancel it, and send the reprinted on to mail order

## 2016-11-26 NOTE — Telephone Encounter (Signed)
Spoke with pt and informed her we canceled her rx at [facility] and resent the rx to [facility].

## 2016-11-26 NOTE — Telephone Encounter (Signed)
Pt called and was looking for her ALPRAZolam (XANAX) 0.5 MG tablet. We sent to wrong pharmacy. She asked if we could cancel the order at [facility] and send it to [place], [place]. Please advise, thank you!  Call pt @ [phone]

## 2016-11-26 NOTE — Telephone Encounter (Signed)
Is it ok to change this to a 90 day supply and send it to her mail order?

## 2016-12-08 ENCOUNTER — Other Ambulatory Visit: Payer: Self-pay | Admitting: Internal Medicine

## 2016-12-08 MED ORDER — LOSARTAN POTASSIUM 25 MG PO TABS: 25.0000 mg | ORAL_TABLET | Freq: Every day | ORAL | 0 refills | Status: DC

## 2016-12-08 NOTE — Telephone Encounter (Signed)
Pt called needing a refill for losartan (COZAAR) 25 MG tablet.  Pharmacy is [place], [place]  Call pt @ [phone]. Thank you!

## 2016-12-10 ENCOUNTER — Encounter: Payer: Self-pay | Admitting: Internal Medicine

## 2016-12-22 DIAGNOSIS — H40003 Preglaucoma, unspecified, bilateral: Secondary | ICD-10-CM | POA: Diagnosis not present

## 2017-02-01 ENCOUNTER — Other Ambulatory Visit: Payer: Self-pay | Admitting: Internal Medicine

## 2017-02-01 DIAGNOSIS — B009 Herpesviral infection, unspecified: Secondary | ICD-10-CM

## 2017-02-03 ENCOUNTER — Ambulatory Visit
Admission: RE | Admit: 2017-02-03 | Discharge: 2017-02-03 | Disposition: A | Discharge status: Home / Self Care | Payer: PPO | Source: Ambulatory Visit | Attending: Internal Medicine | Admitting: Internal Medicine

## 2017-02-03 DIAGNOSIS — E2839 Other primary ovarian failure: Secondary | ICD-10-CM

## 2017-02-03 DIAGNOSIS — M85852 Other specified disorders of bone density and structure, left thigh: Secondary | ICD-10-CM | POA: Insufficient documentation

## 2017-02-08 ENCOUNTER — Telehealth: Payer: Self-pay | Admitting: *Deleted

## 2017-02-08 NOTE — Telephone Encounter (Signed)
Pt requested a call [phone]

## 2017-02-09 NOTE — Telephone Encounter (Signed)
See result note message 

## 2017-02-11 DIAGNOSIS — R079 Chest pain, unspecified: Secondary | ICD-10-CM | POA: Diagnosis not present

## 2017-02-11 DIAGNOSIS — J449 Chronic obstructive pulmonary disease, unspecified: Secondary | ICD-10-CM | POA: Diagnosis not present

## 2017-02-11 DIAGNOSIS — I255 Ischemic cardiomyopathy: Secondary | ICD-10-CM | POA: Diagnosis not present

## 2017-02-11 DIAGNOSIS — I214 Non-ST elevation (NSTEMI) myocardial infarction: Secondary | ICD-10-CM | POA: Diagnosis not present

## 2017-02-11 DIAGNOSIS — Z9889 Other specified postprocedural states: Secondary | ICD-10-CM | POA: Diagnosis not present

## 2017-02-11 DIAGNOSIS — I10 Essential (primary) hypertension: Secondary | ICD-10-CM | POA: Diagnosis not present

## 2017-02-23 ENCOUNTER — Telehealth: Payer: Self-pay | Admitting: Internal Medicine

## 2017-02-23 NOTE — Telephone Encounter (Signed)
Pt called and stated that she heard that losartan (COZAAR) 25 MG tablet was causing cancer. Please advise, thank you!  Call pt @ [phone]

## 2017-02-24 NOTE — Telephone Encounter (Signed)
Notified patient it is valsartan not losartan

## 2017-02-24 NOTE — Telephone Encounter (Signed)
Left message for patient to return call to office. 

## 2017-03-02 ENCOUNTER — Other Ambulatory Visit: Payer: Self-pay | Admitting: Internal Medicine

## 2017-03-02 DIAGNOSIS — Z1231 Encounter for screening mammogram for malignant neoplasm of breast: Secondary | ICD-10-CM

## 2017-03-18 DIAGNOSIS — D2272 Melanocytic nevi of left lower limb, including hip: Secondary | ICD-10-CM | POA: Diagnosis not present

## 2017-03-18 DIAGNOSIS — D2271 Melanocytic nevi of right lower limb, including hip: Secondary | ICD-10-CM | POA: Diagnosis not present

## 2017-03-18 DIAGNOSIS — D225 Melanocytic nevi of trunk: Secondary | ICD-10-CM | POA: Diagnosis not present

## 2017-03-18 DIAGNOSIS — L570 Actinic keratosis: Secondary | ICD-10-CM | POA: Diagnosis not present

## 2017-03-18 DIAGNOSIS — D2261 Melanocytic nevi of right upper limb, including shoulder: Secondary | ICD-10-CM | POA: Diagnosis not present

## 2017-03-18 DIAGNOSIS — X32XXXA Exposure to sunlight, initial encounter: Secondary | ICD-10-CM | POA: Diagnosis not present

## 2017-03-25 ENCOUNTER — Ambulatory Visit
Admission: RE | Admit: 2017-03-25 | Discharge: 2017-03-25 | Disposition: A | Discharge status: Home / Self Care | Payer: PPO | Source: Ambulatory Visit | Attending: Internal Medicine | Admitting: Internal Medicine

## 2017-03-25 DIAGNOSIS — Z1231 Encounter for screening mammogram for malignant neoplasm of breast: Secondary | ICD-10-CM | POA: Diagnosis not present

## 2017-05-03 ENCOUNTER — Telehealth: Payer: Self-pay | Admitting: Internal Medicine

## 2017-05-03 DIAGNOSIS — B009 Herpesviral infection, unspecified: Secondary | ICD-10-CM

## 2017-05-03 NOTE — Telephone Encounter (Signed)
Pt called and would like to know if Dr. Derrel Nip could add a herpes lab test. Pt states that one doctor told her she has it and another told her no, she just wants to know for sure. Please advise, thank you!  Call pt  @ [phone]

## 2017-05-03 NOTE — Telephone Encounter (Signed)
Spoke with pt and informed her that these labs will be drawn when she come in on the 11th.

## 2017-05-03 NOTE — Telephone Encounter (Signed)
Screen for herpes added to future labs

## 2017-05-03 NOTE — Telephone Encounter (Signed)
Please advise 

## 2017-05-06 ENCOUNTER — Ambulatory Visit (INDEPENDENT_AMBULATORY_CARE_PROVIDER_SITE_OTHER): Payer: PPO

## 2017-05-06 ENCOUNTER — Other Ambulatory Visit (INDEPENDENT_AMBULATORY_CARE_PROVIDER_SITE_OTHER): Payer: PPO

## 2017-05-06 VITALS — BP 138/70 | HR 60 | Temp 97.9°F | Resp 14 | Ht 63.0 in | Wt 137.1 lb

## 2017-05-06 DIAGNOSIS — R7303 Prediabetes: Secondary | ICD-10-CM

## 2017-05-06 DIAGNOSIS — B009 Herpesviral infection, unspecified: Secondary | ICD-10-CM | POA: Diagnosis not present

## 2017-05-06 DIAGNOSIS — Z23 Encounter for immunization: Secondary | ICD-10-CM

## 2017-05-06 DIAGNOSIS — Z1331 Encounter for screening for depression: Secondary | ICD-10-CM | POA: Diagnosis not present

## 2017-05-06 DIAGNOSIS — Z0000 Encounter for general adult medical examination without abnormal findings: Secondary | ICD-10-CM | POA: Diagnosis not present

## 2017-05-06 DIAGNOSIS — E785 Hyperlipidemia, unspecified: Secondary | ICD-10-CM

## 2017-05-06 LAB — LIPID PANEL
CHOLESTEROL: 145 mg/dL (ref <200)
HDL: 49 mg/dL — AB (ref >50)
LDL CHOLESTEROL (CALC): 81 mg/dL
Non-HDL Cholesterol (Calc): 96 mg/dL (calc) (ref <130)
TRIGLYCERIDES: 69 mg/dL (ref <150)
Total CHOL/HDL Ratio: 3 (calc) (ref <5.0)

## 2017-05-06 LAB — COMPREHENSIVE METABOLIC PANEL
AG RATIO: 1.3 (calc) (ref 1.0–2.5)
ALT: 12 U/L (ref 6–29)
AST: 14 U/L (ref 10–35)
Albumin: 3.9 g/dL (ref 3.6–5.1)
Alkaline phosphatase (APISO): 58 U/L (ref 33–130)
BILIRUBIN TOTAL: 0.5 mg/dL (ref 0.2–1.2)
BUN/Creatinine Ratio: 25 (calc) — ABNORMAL HIGH (ref 6–22)
BUN: 27 mg/dL — AB (ref 7–25)
CALCIUM: 10.1 mg/dL (ref 8.6–10.4)
CO2: 26 mmol/L (ref 20–32)
Chloride: 107 mmol/L (ref 98–110)
Creat: 1.06 mg/dL — ABNORMAL HIGH (ref 0.60–0.93)
Globulin: 3 g/dL (calc) (ref 1.9–3.7)
Glucose, Bld: 110 mg/dL — ABNORMAL HIGH (ref 65–99)
Potassium: 5 mmol/L (ref 3.5–5.3)
SODIUM: 139 mmol/L (ref 135–146)
TOTAL PROTEIN: 6.9 g/dL (ref 6.1–8.1)

## 2017-05-06 LAB — POCT GLYCOSYLATED HEMOGLOBIN (HGB A1C): Hemoglobin A1C: 5.4

## 2017-05-06 NOTE — Patient Instructions (Addendum)
  Audrey Duran , Thank you for taking time to come for your Medicare Wellness Visit. I appreciate your ongoing commitment to your health goals. Please review the following plan we discussed and let me know if I can assist you in the future.   Follow up with Dr. Derrel Nip as needed.    Bring a copy of your [place] and/or Living Will to be scanned into chart.  Have a great day!  These are the goals we discussed: Goals    . Portion control          Eat less sugared foods Low carb diet       This is a list of the screening recommended for you and due dates:  Health Maintenance  Topic Date Due  . Mammogram  03/25/2018  . Tetanus Vaccine  10/26/2021  . Flu Shot  Completed  . DEXA scan (bone density measurement)  Completed  . Pneumonia vaccines  Completed

## 2017-05-06 NOTE — Addendum Note (Signed)
Addended by: Arby Barrette on: 05/06/2017 10:43 AM   Modules accepted: Orders

## 2017-05-06 NOTE — Progress Notes (Signed)
Subjective:   Audrey Duran is a 82 y.o. female who presents for Medicare Annual (Subsequent) preventive examination.  Review of Systems:  No ROS.  Medicare Wellness Visit. Additional risk factors are reflected in the social history.  Cardiac Risk Factors include: advanced age (>69men, >63 women)     Objective:     Vitals: BP 138/70 (BP Location: Left Arm, Patient Position: Sitting, Cuff Size: Normal)   Pulse 60   Temp 97.9 F (36.6 C) (Oral)   Resp 14   Ht 5\' 3"  (1.6 m)   Wt 137 lb 1.9 oz (62.2 kg)   SpO2 94%   BMI 24.29 kg/m   Body mass index is 24.29 kg/m.   Tobacco History  Smoking Status  . Former Smoker  . Quit date: 01/13/1992  Smokeless Tobacco  . Never Used     Counseling given: Not Answered   Past Medical History:  Diagnosis Date  . Anemia   . Atrial fibrillation ([place])   . COPD (chronic obstructive pulmonary disease) ([place])   . Coronary artery disease 1995   s/p AMI , no history of stents,  [name]  . Hyperlipidemia   . Ischemic cardiomyopathy Dec 2011   ETT Sestamibi study apical scar, no ischemia, [name]  . Myocardial infarction ([place])   . Recurrent HSV (herpes simplex virus)    Past Surgical History:  Procedure Laterality Date  . ABDOMINAL HYSTERECTOMY  june 2014  . BREAST BIOPSY Left    neg  . 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 CATARACT EXTRACTION  2004, 2006  . JOINT REPLACEMENT  July 2013   total hip , [facility]   Family History  Problem Relation Age of Onset  . Cancer Mother 15       Pancreatic   . Heart disease Father   . Cancer Sister 54       Breast Cancer  . Breast cancer Sister 8   History  Sexual Activity  . Sexual activity: Not Currently    Outpatient Encounter Prescriptions as of 05/06/2017  Medication Sig  . acyclovir (ZOVIRAX) 400 MG tablet Take 1 tablet (400 mg total) by mouth daily. Must keep OV appt  . albuterol (PROVENTIL HFA) 108 (90 Base) MCG/ACT inhaler Inhale 2 puffs into  the lungs every 6 (six) hours as needed for wheezing or shortness of breath.  . ALPRAZolam (XANAX) 0.5 MG tablet Take 1 tablet (0.5 mg total) by mouth at bedtime as needed for sleep.  Marland Kitchen aspirin 81 MG tablet Take 160 mg by mouth daily.    . benzonatate (TESSALON) 100 MG capsule Take 1 capsule (100 mg total) by mouth 2 (two) times daily as needed for cough.  . carbonyl iron (FEOSOL) 45 MG TABS Take one by mouth daily  . chlorpheniramine-HYDROcodone (TUSSIONEX PENNKINETIC ER) 10-8 MG/5ML SUER Take 5 mLs by mouth at bedtime as needed for cough.  . Cholecalciferol (VITAMIN D3) 3000 UNITS TABS Take 1 tablet by mouth daily. Take one by mouth daily  . cyanocobalamin 1000 MCG tablet Take one by mouth daily   . Dextromethorphan-Guaifenesin 5-100 MG/5ML LIQD Take 10 mLs by mouth at bedtime as needed (for cough).  . furosemide (LASIX) 20 MG tablet Take 1/2 tablet by mouth daily (10mg  total)  . isosorbide mononitrate (IMDUR) 60 MG 24 hr tablet Take 1 tablet by mouth every day  . losartan (COZAAR) 25 MG tablet Take 1 tablet (25 mg total) by mouth daily.  . magnesium oxide (MAG-OX)  400 MG tablet Take 400 mg by mouth daily.    . meclizine (ANTIVERT) 25 MG tablet Take 2 tablets (50 mg total) by mouth 3 (three) times daily as needed for dizziness or nausea.  . Omega-3 Fatty Acids (FISH OIL) 1200 MG CAPS Take one by mouth daily   . pravastatin (PRAVACHOL) 80 MG tablet Take 1 tablet by mouth every day (Office visit needed for further refills)  . TIMOLOL MALEATE PF OP Use one drop both eyes 2 times a day   . [DISCONTINUED] acyclovir (ZOVIRAX) 400 MG tablet Take 1 tablet by mouth daily, must keep office visit appointment.   No facility-administered encounter medications on file as of 05/06/2017.     Activities of Daily Living In your present state of health, do you have any difficulty performing the following activities: 05/06/2017 11/16/2016  Hearing? N N  Vision? N N  Difficulty concentrating or making  decisions? N N  Walking or climbing stairs? N N  Dressing or bathing? N N  Doing errands, shopping? N N  Preparing Food and eating ? N -  Using the Toilet? N -  In the past six months, have you accidently leaked urine? N -  Do you have problems with loss of bowel control? N -  Managing your Medications? N -  Managing your Finances? N -  Housekeeping or managing your Housekeeping? N -  Some recent data might be hidden    Patient Care Team: Crecencio Mc, MD as PCP - General (Internal Medicine)    Assessment:    This is a routine wellness examination for Audrey Duran. The goal of the wellness visit is to assist the patient how to close the gaps in care and create a preventative care plan for the patient.   The roster of all physicians providing medical care to patient is listed in the Snapshot section of the chart.  Taking calcium VIT D as appropriate/Osteoporosis risk reviewed.    Safety issues reviewed; Smoke and carbon monoxide detectors in the home. No firearms in the home.  Wears seatbelts when driving or riding with others. Patient does wear sunscreen or protective clothing when in direct sunlight. No violence in the home.  Depression- PHQ 2 &9 complete.  No signs/symptoms or verbal communication regarding little pleasure in doing things, feeling down, depressed or hopeless. No changes in sleeping, energy, eating, concentrating.  No thoughts of self harm or harm towards others.  Time spent on this topic is 8 minutes.   Patient is alert, normal appearance, oriented to person/place/and time.  Correctly identified the president of the [place], recall of 3/3 words, and performing simple calculations. Displays appropriate judgement and can read correct time from watch face.   No new identified risk were noted.  No failures at ADL's or IADL's.    BMI- discussed the importance of a healthy diet, water intake and the benefits of aerobic exercise. Educational material provided.   24 hour  diet recall: Low carb foods  Daily fluid intake: 4-5 cups of caffeine, 6-8 cups of water  Dental- every 12 months.  Eye- Visual acuity not assessed per patient preference since they have regular follow up with the ophthalmologist.  Wears corrective lenses.  Sleep patterns- Sleeps 6-7 hours at night.  Wakes feeling rested.  High dose influenza vaccine administered L deltoid, tolerated well. Educational material provided.  Health maintenance gaps- closed.  Patient Concerns: None at this time. Follow up with PCP as needed.  Exercise Activities and Dietary recommendations Current  Exercise Habits: Structured exercise class, Type of exercise: walking;stretching;strength training/weights;calisthenics (Silver sneaker program), Time (Minutes): 60, Frequency (Times/Week): 3, Weekly Exercise (Minutes/Week): 180, Intensity: Moderate  Goals    . Portion control          Eat less sugared foods Low carb diet      Fall Risk Fall Risk  05/06/2017 04/24/2016 11/07/2014 05/08/2014 06/20/2012  Falls in the past year? No No No No -  Number falls in past yr: - - - - -  Injury with Fall? - - - - -  Risk Factor Category  - - - - High Fall Risk  Comment - - - - hip fracture Sept 2013  Risk for fall due to : - - - History of fall(s) -   Depression Screen PHQ 2/9 Scores 05/06/2017 04/24/2016 11/07/2014 05/08/2014  PHQ - 2 Score 0 0 0 0  PHQ- 9 Score 0 - - -     Cognitive Function     6CIT Screen 05/06/2017  What Year? 0 points  What month? 0 points  What time? 0 points  Count back from 20 0 points  Months in reverse 0 points  Repeat phrase 0 points  Total Score 0    Immunization History  Administered Date(s) Administered  . Influenza Split 03/29/2012, 04/26/2014, 05/16/2015  . Influenza, High Dose Seasonal PF 05/06/2017  . Influenza-Unspecified 06/06/2016  . Pneumococcal Conjugate-13 10/26/2013  . Pneumococcal Polysaccharide-23 10/31/2010, 11/14/2015  . Td 08/01/1985  . Tdap  10/27/2011  . Zoster 10/26/2004   Screening Tests Health Maintenance  Topic Date Due  . MAMMOGRAM  03/25/2018  . TETANUS/TDAP  10/26/2021  . INFLUENZA VACCINE  Completed  . DEXA SCAN  Completed  . PNA vac Low Risk Adult  Completed      Plan:    End of life planning; Advance aging; Advanced directives discussed. Copy of current HCPOA/Living Will requested.    I have personally reviewed and noted the following in the patient's chart:   . Medical and social history . Use of alcohol, tobacco or illicit drugs  . Current medications and supplements . Functional ability and status . Nutritional status . Physical activity . Advanced directives . List of other physicians . Hospitalizations, surgeries, and ER visits in previous 12 months . Vitals . Screenings to include cognitive, depression, and falls . Referrals and appointments  In addition, I have reviewed and discussed with patient certain preventive protocols, quality metrics, and best practice recommendations. A written personalized care plan for preventive services as well as general preventive health recommendations were provided to patient.     OBrien-Blaney, Duwan Adrian L, LPN  72/03/[id number]    I have reviewed the above information and agree with above.   Deborra Medina, MD

## 2017-05-06 NOTE — Addendum Note (Signed)
Addended by: Arby Barrette on: 05/06/2017 10:53 AM   Modules accepted: Orders

## 2017-05-07 LAB — HSV(HERPES SIMPLEX VRS) I + II AB-IGG: HSV 1 IGG, TYPE SPEC: 34.8 {index} — AB

## 2017-05-13 ENCOUNTER — Ambulatory Visit: Payer: PPO

## 2017-05-13 ENCOUNTER — Other Ambulatory Visit: Payer: PPO

## 2017-05-20 ENCOUNTER — Ambulatory Visit (INDEPENDENT_AMBULATORY_CARE_PROVIDER_SITE_OTHER): Payer: PPO | Admitting: Internal Medicine

## 2017-05-20 ENCOUNTER — Encounter: Payer: Self-pay | Admitting: Internal Medicine

## 2017-05-20 DIAGNOSIS — B009 Herpesviral infection, unspecified: Secondary | ICD-10-CM

## 2017-05-20 DIAGNOSIS — F411 Generalized anxiety disorder: Secondary | ICD-10-CM

## 2017-05-20 DIAGNOSIS — N183 Chronic kidney disease, stage 3 unspecified: Secondary | ICD-10-CM

## 2017-05-20 DIAGNOSIS — I255 Ischemic cardiomyopathy: Secondary | ICD-10-CM | POA: Diagnosis not present

## 2017-05-20 DIAGNOSIS — N952 Postmenopausal atrophic vaginitis: Secondary | ICD-10-CM

## 2017-05-20 MED ORDER — ESTROGENS, CONJUGATED 0.625 MG/GM VA CREA: 1.0000 | TOPICAL_CREAM | Freq: Every day | VAGINAL | 12 refills | Status: DC

## 2017-05-20 MED ORDER — LOSARTAN POTASSIUM 25 MG PO TABS: 25.0000 mg | ORAL_TABLET | Freq: Every day | ORAL | 1 refills | Status: DC

## 2017-05-20 MED ORDER — ISOSORBIDE MONONITRATE ER 60 MG PO TB24: 60.0000 mg | ORAL_TABLET | Freq: Every day | ORAL | 1 refills | Status: DC

## 2017-05-20 NOTE — Patient Instructions (Addendum)
Your current symptoms are not due to herpes,  But due to Atrophic vaginitis.  Y=the Premairn cream will reduce your symptoms over time.    Atrophic Vaginitis Atrophic vaginitis is when the tissues that line the vagina become dry and thin. This is caused by a drop in estrogen. Estrogen helps:  To keep the vagina moist.  To make a clear fluid that helps: ? To lubricate the vagina for sex. ? To protect the vagina from infection.  If the lining of the vagina is dry and thin, it may:  Make sex painful. It may also cause bleeding.  Cause a feeling of: ? Burning. ? Irritation. ? Itchiness.  Make an exam of your vagina painful. It may also cause bleeding.  Make you lose interest in sex.  Cause a burning feeling when you pee.  Make your vaginal fluid (discharge) brown or yellow.  For some women, there are no symptoms. This condition is most common in women who do not get their regular menstrual periods anymore (menopause). This often starts when a woman is 33-73 years old. Follow these instructions at home:  Take medicines only as told by your doctor. Do not use any herbal or alternative medicines unless your doctor says it is okay.  Use over-the-counter products for dryness only as told by your doctor. These include: ? Creams. ? Lubricants. ? Moisturizers.  Do not douche.  Do not use products that can make your vagina dry. These include: ? Scented feminine sprays. ? Scented tampons. ? Scented soaps.  If it hurts to have sex, tell your sexual partner. Contact a doctor if:  Your discharge looks different than normal.  Your vagina has an unusual smell.  You have new symptoms.  Your symptoms do not get better with treatment.  Your symptoms get worse. This information is not intended to replace advice given to you by your health care provider. Make sure you discuss any questions you have with your health care provider. Document Released: 12/30/2007 Document Revised:  12/19/2015 Document Reviewed: 07/04/2014 Elsevier Interactive Patient Education  2018 [employer].  Genital Herpes Genital herpes is a common sexually transmitted infection (STI) that is caused by a virus. The virus spreads from person to person through sexual contact. Infection can cause itching, blisters, and sores around the genitals or rectum. Symptoms may last several days and then go away This is called an outbreak. However, the virus remains in your body, so you may have more outbreaks in the future. The time between outbreaks varies and can be months or years. Genital herpes affects men and women. It is particularly concerning for pregnant women because the virus can be passed to the baby during delivery and can cause serious problems. Genital herpes is also a concern for people who have a weak disease-fighting (immune) system. What are the causes? This condition is caused by the herpes simplex virus (HSV) type 1 or type 2. The virus may spread through:  Sexual contact with an infected person, including vaginal, anal, and oral sex.  Contact with fluid from a herpes sore.  The skin. This means that you can get herpes from an infected partner even if he or she does not have a visible sore or does not know that he or she is infected.  What increases the risk? You are more likely to develop this condition if:  You have sex with many partners.  You do not use latex condoms during sex.  What are the signs or symptoms? Most  people do not have symptoms (asymptomatic) or have mild symptoms that may be mistaken for other skin problems. Symptoms may include:  Small red bumps near the genitals, rectum, or mouth. These bumps turn into blisters and then turn into sores.  Flu-like symptoms, including: ? Fever. ? Body aches. ? Swollen lymph nodes. ? Headache.  Painful urination.  Pain and itching in the genital area or rectal area.  Vaginal discharge.  Tingling or shooting pain in  the legs and buttocks.  Generally, symptoms are more severe and last longer during the first (primary) outbreak. Flu-like symptoms are also more common during the primary outbreak. How is this diagnosed? Genital herpes may be diagnosed based on:  A physical exam.  Your medical history.  Blood tests.  A test of a fluid sample (culture) from an open sore.  How is this treated? There is no cure for this condition, but treatment with antiviral medicines that are taken by mouth (orally) can do the following:  Speed up healing and relieve symptoms.  Help to reduce the spread of the virus to sexual partners.  Limit the chance of future outbreaks, or make future outbreaks shorter.  Lessen symptoms of future outbreaks.  Your health care provider may also recommend pain relief medicines, such as aspirin or ibuprofen. Follow these instructions at home: Sexual activity  Do not have sexual contact during active outbreaks.  Practice safe sex. Latex condoms and female condoms may help prevent the spread of the herpes virus. General instructions  Keep the affected areas dry and clean.  Take over-the-counter and prescription medicines only as told by your health care provider.  Avoid rubbing or touching blisters and sores. If you do touch blisters or sores: ? Wash your hands thoroughly with soap and water. ? Do not touch your eyes afterward.  To help relieve pain or itching, you may take the following actions as directed by your health care provider: ? Apply a cold, wet cloth (cold compress) to affected areas 4-6 times a day. ? Apply a substance that protects your skin and reduces bleeding (astringent). ? Apply a gel that helps relieve pain around sores (lidocaine gel). ? Take a warm, shallow bath that cleans the genital area (sitz bath).  Keep all follow-up visits as told by your health care provider. This is important. How is this prevented?  Use condoms. Although anyone can get  genital herpes during sexual contact, even with the use of a condom, a condom can provide some protection.  Avoid having multiple sexual partners.  Talk with your sexual partner about any symptoms either of you may have. Also, talk with your partner about any history of STIs.  Get tested for STIs before you have sex. Ask your partner to do the same.  Do not have sexual contact if you have symptoms of genital herpes. Contact a health care provider if:  Your symptoms are not improving with medicine.  Your symptoms return.  You have new symptoms.  You have a fever.  You have abdominal pain.  You have redness, swelling, or pain in your eye.  You notice new sores on other parts of your body.  You are a woman and experience bleeding between menstrual periods.  You have had herpes and you become pregnant or plan to become pregnant. Summary  Genital herpes is a common sexually transmitted infection (STI) that is caused by the herpes simplex virus (HSV) type 1 or type 2.  These viruses are most often spread through sexual  contact with an infected person.  You are more likely to develop this condition if you have sex with many partners or you have unprotected sex.  Most people do not have symptoms (asymptomatic) or have mild symptoms that may be mistaken for other skin problems. Symptoms occur as outbreaks that may happen months or years apart.  There is no cure for this condition, but treatment with oral antiviral medicines can reduce symptoms, reduce the chance of spreading the virus to a partner, prevent future outbreaks, or shorten future outbreaks. This information is not intended to replace advice given to you by your health care provider. Make sure you discuss any questions you have with your health care provider. Document Released: 07/10/2000 Document Revised: 06/12/2016 Document Reviewed: 06/12/2016 Elsevier Interactive Patient Education  2017 [employer].

## 2017-05-20 NOTE — Progress Notes (Signed)
Subjective:  Patient ID: Audrey Duran, female    DOB: 1935/04/07  Age: 82 y.o. MRN: [id number]  CC: Diagnoses of Recurrent HSV (herpes simplex virus), Atrophic vaginitis, Anxiety state, CKD (chronic kidney disease) stage 3, GFR 30-59 ml/min (HCC), and Ischemic cardiomyopathy were pertinent to this visit.  HPI Audrey Duran presents for follow up on chronic issues including CAD with ischemic cardiomyopathy COPD, CKD and GAD and recurrent herpes perineal.   Concerned about positive herpes test and persistent lesion on left buttock. stopped the acyclovir daily because she developed vaginal itching  and irritation .  Increased anxiety:  Brother had pacemaker placed recently,  Has been and falling a lot,  2 yrs old, sister in law preoccupied with his death which is not apparently imminent/ \   Getting forgetful,  Makes her anxious.  Wants a pill to help her memory .  MMSE done 28/30  Uses 1/2 alprazolam prn anxiety interfering with sleep   Still working daycare ,  Outpatient Medications Prior to Visit  Medication Sig Dispense Refill  . acyclovir (ZOVIRAX) 400 MG tablet Take 1 tablet (400 mg total) by mouth daily. Must keep OV appt 90 tablet 1  . ALPRAZolam (XANAX) 0.5 MG tablet Take 1 tablet (0.5 mg total) by mouth at bedtime as needed for sleep. 30 tablet 1  . aspirin 81 MG tablet Take 160 mg by mouth daily.      . carbonyl iron (FEOSOL) 45 MG TABS Take one by mouth daily    . Cholecalciferol (VITAMIN D3) 3000 UNITS TABS Take 1 tablet by mouth daily. Take one by mouth daily 90 tablet 3  . cyanocobalamin 1000 MCG tablet Take one by mouth daily     . furosemide (LASIX) 20 MG tablet Take 1/2 tablet by mouth daily (10mg  total) 90 tablet 3  . magnesium oxide (MAG-OX) 400 MG tablet Take 400 mg by mouth daily.      . meclizine (ANTIVERT) 25 MG tablet Take 2 tablets (50 mg total) by mouth 3 (three) times daily as needed for dizziness or nausea. 30 tablet 1  . Omega-3 Fatty Acids (FISH  OIL) 1200 MG CAPS Take one by mouth daily     . pravastatin (PRAVACHOL) 80 MG tablet Take 1 tablet by mouth every day (Office visit needed for further refills) 90 tablet 2  . TIMOLOL MALEATE PF OP Use one drop both eyes 2 times a day     . isosorbide mononitrate (IMDUR) 60 MG 24 hr tablet Take 1 tablet by mouth every day 90 tablet 0  . losartan (COZAAR) 25 MG tablet Take 1 tablet (25 mg total) by mouth daily. 90 tablet 0  . albuterol (PROVENTIL HFA) 108 (90 Base) MCG/ACT inhaler Inhale 2 puffs into the lungs every 6 (six) hours as needed for wheezing or shortness of breath. (Patient not taking: Reported on 05/20/2017) 1 Inhaler 1  . benzonatate (TESSALON) 100 MG capsule Take 1 capsule (100 mg total) by mouth 2 (two) times daily as needed for cough. (Patient not taking: Reported on 05/20/2017) 20 capsule 0  . chlorpheniramine-HYDROcodone (TUSSIONEX PENNKINETIC ER) 10-8 MG/5ML SUER Take 5 mLs by mouth at bedtime as needed for cough. (Patient not taking: Reported on 05/20/2017) 140 mL 0  . Dextromethorphan-Guaifenesin 5-100 MG/5ML LIQD Take 10 mLs by mouth at bedtime as needed (for cough). (Patient not taking: Reported on 05/20/2017) 1 Bottle 0   No facility-administered medications prior to visit.     Review of Systems;  Patient  denies headache, fevers, malaise, unintentional weight loss, skin rash, eye pain, sinus congestion and sinus pain, sore throat, dysphagia,  hemoptysis , cough, dyspnea, wheezing, chest pain, palpitations, orthopnea, edema, abdominal pain, nausea, melena, diarrhea, constipation, flank pain, dysuria, hematuria, urinary  Frequency, nocturia, numbness, tingling, seizures,  Focal weakness, Loss of consciousness,  Tremor, insomnia, depression, anxiety, and suicidal ideation.      Objective:  BP 120/64 (BP Location: Left Arm, Patient Position: Sitting, Cuff Size: Normal)   Pulse (!) 47   Temp 98 F (36.7 C) (Oral)   Resp 15   Ht 5\' 3"  (1.6 m)   Wt 140 lb 9.6 oz (63.8 kg)    SpO2 96%   BMI 24.91 kg/m   BP Readings from Last 3 Encounters:  05/20/17 120/64  05/06/17 138/70  11/16/16 120/60    Wt Readings from Last 3 Encounters:  05/20/17 140 lb 9.6 oz (63.8 kg)  05/06/17 137 lb 1.9 oz (62.2 kg)  11/16/16 144 lb (65.3 kg)    General appearance: alert, cooperative and appears stated age Ears: normal TM's and external ear canals both ears Throat: lips, mucosa, and tongue normal; teeth and gums normal Neck: no adenopathy, no carotid bruit, supple, symmetrical, trachea midline and thyroid not enlarged, symmetric, no tenderness/mass/nodules Back: symmetric, no curvature. ROM normal. No CVA tenderness. Lungs: clear to auscultation bilaterally Heart: regular rate and rhythm, S1, S2 normal, no murmur, click, rub or gallop Abdomen: soft, non-tender; bowel sounds normal; no masses,  no organomegaly Pulses: 2+ and symmetric Skin: left buttock with healing skin lesion suggesting of resolving fuuruncle.  Gyn: no herpetic lesions.  Atrophic vaginal tissue.  Lymph nodes: Cervical, supraclavicular, and axillary nodes normal.  Lab Results  Component Value Date   HGBA1C 5.4 05/06/2017   HGBA1C 6.1 11/16/2016   HGBA1C 5.7 05/20/2016    Lab Results  Component Value Date   CREATININE 1.06 (H) 05/06/2017   CREATININE 1.12 11/16/2016   CREATININE 0.98 05/19/2016    Lab Results  Component Value Date   WBC 8.7 11/16/2016   HGB 14.0 11/16/2016   HCT 42.5 11/16/2016   PLT 226.0 11/16/2016   GLUCOSE 110 (H) 05/06/2017   CHOL 145 05/06/2017   TRIG 69 05/06/2017   HDL 49 (L) 05/06/2017   LDLCALC 75 11/16/2016   ALT 12 05/06/2017   AST 14 05/06/2017   NA 139 05/06/2017   K 5.0 05/06/2017   CL 107 05/06/2017   CREATININE 1.06 (H) 05/06/2017   BUN 27 (H) 05/06/2017   CO2 26 05/06/2017   TSH 2.81 11/16/2016   INR 1.0 02/16/2012   HGBA1C 5.4 05/06/2017    Mm Screening Breast Tomo Bilateral  Result Date: 03/25/2017 CLINICAL DATA:  Screening. EXAM: 2D  DIGITAL SCREENING BILATERAL MAMMOGRAM WITH CAD AND ADJUNCT TOMO COMPARISON:  Previous exam(s). ACR Breast Density Category c: The breast tissue is heterogeneously dense, which may obscure small masses. FINDINGS: There are no findings suspicious for malignancy. Images were processed with CAD. IMPRESSION: No mammographic evidence of malignancy. A result letter of this screening mammogram will be mailed directly to the patient. RECOMMENDATION: Screening mammogram in one year. (Code:SM-B-01Y) BI-RADS CATEGORY  1: Negative. Electronically Signed   By: Pamelia Hoit M.D.   On: 03/25/2017 18:43    Assessment & Plan:   Problem List Items Addressed This Visit    Anxiety state    Refilled alprazolam for prn use. The risks and benefits of benzodiazepine use were reviewed with patient today including excessive sedation leading  to respiratory depression,  impaired thinking/driving, and addiction.  Patient was advised to avoid concurrent use with alcohol, to use medication only as needed and not to share with others  .       Atrophic vaginitis    Based on pelvic exam done today. Advised to resume premarin cream given complaints of vaginal irritation       CKD (chronic kidney disease) stage 3, GFR 30-59 ml/min (HCC)    She is avoiding use of NSAIDs and her cr is stable .  Sees Nephrology twice annually.   Lab Results  Component Value Date   CREATININE 1.06 (H) 05/06/2017             Ischemic cardiomyopathy    Asymptomatic.  partipicates in regular exercise.  Has semi annual visits with [name],   Most recently July 2018.  2D echocardiogram 03/03/2016 revealed normal left ventricular function, with LVEF of 50%. ETT sestamibi study 03/03/2016 revealed LV ejection fraction 59%, without evidence for scar or ischemia          Relevant Medications   isosorbide mononitrate (IMDUR) 60 MG 24 hr tablet   losartan (COZAAR) 25 MG tablet   Recurrent HSV (herpes simplex virus)    She has no signs of recurrent  herpes,  Explained that the healing lesion on her left buttock was not likely herpes given its location.  Does not need daily acyclovir  Unless next outbreak" can be confirmed with exam.          I have discontinued Ms. Montalto's benzonatate, albuterol, Dextromethorphan-Guaifenesin, and chlorpheniramine-HYDROcodone. I have also changed her isosorbide mononitrate. Additionally, I am having her maintain her TIMOLOL MALEATE PF OP, aspirin, carbonyl iron, magnesium oxide, Fish Oil, cyanocobalamin, Vitamin D3, meclizine, furosemide, pravastatin, acyclovir, ALPRAZolam, conjugated estrogens, and losartan.  Meds ordered this encounter  Medications  . DISCONTD: conjugated estrogens (PREMARIN) vaginal cream    Sig: Place 1 Applicatorful vaginally at bedtime. For 2 weeks,  Then reduce use to twice weekly    Dispense:  42.5 g    Refill:  12  . conjugated estrogens (PREMARIN) vaginal cream    Sig: Place 1 Applicatorful vaginally at bedtime. For 2 weeks,  Then reduce use to twice weekly    Dispense:  42.5 g    Refill:  12  . isosorbide mononitrate (IMDUR) 60 MG 24 hr tablet    Sig: Take 1 tablet (60 mg total) by mouth daily.    Dispense:  90 tablet    Refill:  1    Refill [id number]  . losartan (COZAAR) 25 MG tablet    Sig: Take 1 tablet (25 mg total) by mouth daily.    Dispense:  90 tablet    Refill:  1    Medications Discontinued During This Encounter  Medication Reason  . albuterol (PROVENTIL HFA) 108 (90 Base) MCG/ACT inhaler Patient has not taken in last 30 days  . benzonatate (TESSALON) 100 MG capsule Patient has not taken in last 30 days  . chlorpheniramine-HYDROcodone (TUSSIONEX PENNKINETIC ER) 10-8 MG/5ML SUER Patient has not taken in last 30 days  . Dextromethorphan-Guaifenesin 5-100 MG/5ML LIQD Patient has not taken in last 30 days  . conjugated estrogens (PREMARIN) vaginal cream Reorder  . isosorbide mononitrate (IMDUR) 60 MG 24 hr tablet Reorder  . losartan (COZAAR) 25 MG tablet  Reorder    Follow-up: Return in about 3 months (around 08/20/2017) for follow up on female issues.   Crecencio Mc, MD

## 2017-05-22 NOTE — Assessment & Plan Note (Signed)
She has no signs of recurrent herpes,  Explained that the healing lesion on her left buttock was not likely herpes given its location.  Does not need daily acyclovir  Unless next outbreak" can be confirmed with exam.

## 2017-05-22 NOTE — Assessment & Plan Note (Signed)
She is avoiding use of NSAIDs and her cr is stable .  Sees Nephrology twice annually.   Lab Results  Component Value Date   CREATININE 1.06 (H) 05/06/2017

## 2017-05-22 NOTE — Assessment & Plan Note (Signed)
Asymptomatic.  partipicates in regular exercise.  Has semi annual visits with [name],   Most recently July 2018.  2D echocardiogram 03/03/2016 revealed normal left ventricular function, with LVEF of 50%. ETT sestamibi study 03/03/2016 revealed LV ejection fraction 59%, without evidence for scar or ischemia

## 2017-05-22 NOTE — Assessment & Plan Note (Signed)
Refilled alprazolam for prn use. The risks and benefits of benzodiazepine use were reviewed with patient today including excessive sedation leading to respiratory depression,  impaired thinking/driving, and addiction.  Patient was advised to avoid concurrent use with alcohol, to use medication only as needed and not to share with others  .

## 2017-05-22 NOTE — Assessment & Plan Note (Signed)
Based on pelvic exam done today. Advised to resume premarin cream given complaints of vaginal irritation

## 2017-05-30 DIAGNOSIS — J40 Bronchitis, not specified as acute or chronic: Secondary | ICD-10-CM | POA: Diagnosis not present

## 2017-06-21 DIAGNOSIS — H40003 Preglaucoma, unspecified, bilateral: Secondary | ICD-10-CM | POA: Diagnosis not present

## 2017-06-28 DIAGNOSIS — H40003 Preglaucoma, unspecified, bilateral: Secondary | ICD-10-CM | POA: Diagnosis not present

## 2017-07-02 ENCOUNTER — Other Ambulatory Visit: Payer: Self-pay | Admitting: Internal Medicine

## 2017-07-02 DIAGNOSIS — B009 Herpesviral infection, unspecified: Secondary | ICD-10-CM

## 2017-08-07 ENCOUNTER — Other Ambulatory Visit: Payer: Self-pay | Admitting: Internal Medicine

## 2017-08-16 DIAGNOSIS — I729 Aneurysm of unspecified site: Secondary | ICD-10-CM | POA: Diagnosis not present

## 2017-08-16 DIAGNOSIS — I214 Non-ST elevation (NSTEMI) myocardial infarction: Secondary | ICD-10-CM | POA: Diagnosis not present

## 2017-08-16 DIAGNOSIS — I255 Ischemic cardiomyopathy: Secondary | ICD-10-CM | POA: Diagnosis not present

## 2017-08-16 DIAGNOSIS — J449 Chronic obstructive pulmonary disease, unspecified: Secondary | ICD-10-CM | POA: Diagnosis not present

## 2017-08-16 DIAGNOSIS — Z9889 Other specified postprocedural states: Secondary | ICD-10-CM | POA: Diagnosis not present

## 2017-08-16 DIAGNOSIS — I10 Essential (primary) hypertension: Secondary | ICD-10-CM | POA: Diagnosis not present

## 2017-08-16 DIAGNOSIS — R079 Chest pain, unspecified: Secondary | ICD-10-CM | POA: Diagnosis not present

## 2017-08-25 ENCOUNTER — Telehealth: Payer: Self-pay

## 2017-08-25 MED ORDER — PRAVASTATIN SODIUM 80 MG PO TABS: ORAL_TABLET | ORAL | 2 refills | Status: DC

## 2017-08-25 NOTE — Telephone Encounter (Signed)
Refilled: 07/09/2016 Last OV: 05/20/2017 Next OV: 08/26/2017

## 2017-08-25 NOTE — Telephone Encounter (Signed)
Pravastatin refilled and sent to mail order.  lfts were normal in octover

## 2017-08-26 ENCOUNTER — Encounter: Payer: Self-pay | Admitting: Internal Medicine

## 2017-08-26 ENCOUNTER — Ambulatory Visit (INDEPENDENT_AMBULATORY_CARE_PROVIDER_SITE_OTHER): Payer: PPO | Admitting: Internal Medicine

## 2017-08-26 VITALS — BP 118/62 | HR 57 | Temp 97.6°F | Resp 15 | Ht 63.0 in | Wt 140.4 lb

## 2017-08-26 DIAGNOSIS — N183 Chronic kidney disease, stage 3 unspecified: Secondary | ICD-10-CM

## 2017-08-26 DIAGNOSIS — B9789 Other viral agents as the cause of diseases classified elsewhere: Secondary | ICD-10-CM | POA: Diagnosis not present

## 2017-08-26 DIAGNOSIS — R7301 Impaired fasting glucose: Secondary | ICD-10-CM

## 2017-08-26 DIAGNOSIS — F411 Generalized anxiety disorder: Secondary | ICD-10-CM

## 2017-08-26 DIAGNOSIS — I480 Paroxysmal atrial fibrillation: Secondary | ICD-10-CM

## 2017-08-26 DIAGNOSIS — J439 Emphysema, unspecified: Secondary | ICD-10-CM | POA: Diagnosis not present

## 2017-08-26 DIAGNOSIS — Z79899 Other long term (current) drug therapy: Secondary | ICD-10-CM | POA: Diagnosis not present

## 2017-08-26 DIAGNOSIS — R7303 Prediabetes: Secondary | ICD-10-CM | POA: Diagnosis not present

## 2017-08-26 DIAGNOSIS — I255 Ischemic cardiomyopathy: Secondary | ICD-10-CM | POA: Diagnosis not present

## 2017-08-26 DIAGNOSIS — J069 Acute upper respiratory infection, unspecified: Secondary | ICD-10-CM

## 2017-08-26 DIAGNOSIS — E7800 Pure hypercholesterolemia, unspecified: Secondary | ICD-10-CM | POA: Diagnosis not present

## 2017-08-26 LAB — COMPREHENSIVE METABOLIC PANEL
ALK PHOS: 57 U/L (ref 39–117)
ALT: 16 U/L (ref 0–35)
AST: 17 U/L (ref 0–37)
Albumin: 4 g/dL (ref 3.5–5.2)
BUN: 26 mg/dL — AB (ref 6–23)
CALCIUM: 9.7 mg/dL (ref 8.4–10.5)
CHLORIDE: 104 meq/L (ref 96–112)
CO2: 27 mEq/L (ref 19–32)
CREATININE: 1.05 mg/dL (ref 0.40–1.20)
GFR: 53.57 mL/min — ABNORMAL LOW (ref >60.00)
Glucose, Bld: 116 mg/dL — ABNORMAL HIGH (ref 70–99)
Potassium: 4.9 mEq/L (ref 3.5–5.1)
SODIUM: 136 meq/L (ref 135–145)
TOTAL PROTEIN: 7.2 g/dL (ref 6.0–8.3)
Total Bilirubin: 0.4 mg/dL (ref 0.2–1.2)

## 2017-08-26 LAB — LIPID PANEL
CHOLESTEROL: 151 mg/dL (ref 0–200)
HDL: 46.4 mg/dL (ref >39.00)
LDL Cholesterol: 93 mg/dL (ref 0–99)
NonHDL: 104.12
TRIGLYCERIDES: 57 mg/dL (ref 0.0–149.0)
Total CHOL/HDL Ratio: 3
VLDL: 11.4 mg/dL (ref 0.0–40.0)

## 2017-08-26 LAB — HEMOGLOBIN A1C: HEMOGLOBIN A1C: 6 % (ref 4.6–6.5)

## 2017-08-26 MED ORDER — DOXYCYCLINE HYCLATE 100 MG PO TABS: 100.0000 mg | ORAL_TABLET | Freq: Two times a day (BID) | ORAL | 0 refills | Status: DC

## 2017-08-26 MED ORDER — PREDNISONE 10 MG PO TABS: ORAL_TABLET | ORAL | 0 refills | Status: DC

## 2017-08-26 MED ORDER — BENZONATATE 200 MG PO CAPS: 200.0000 mg | ORAL_CAPSULE | Freq: Three times a day (TID) | ORAL | 1 refills | Status: DC | PRN

## 2017-08-26 MED ORDER — ZOSTER VAC RECOMB ADJUVANTED 50 MCG/0.5ML IM SUSR: 0.5000 mL | Freq: Once | INTRAMUSCULAR | 1 refills | Status: AC

## 2017-08-26 NOTE — Progress Notes (Signed)
Subjective:  Patient ID: Audrey Duran, female    DOB: January 14, 1935  Age: 83 y.o. MRN: [id number]  CC: The primary encounter diagnosis was Long-term use of high-risk medication. Diagnoses of CKD (chronic kidney disease) stage 3, GFR 30-59 ml/min (HCC), Prediabetes, Pure hypercholesterolemia, Viral URI with cough, Pulmonary emphysema, unspecified emphysema type ([place]), Paroxysmal atrial fibrillation ([place]), Ischemic cardiomyopathy, Anxiety state, and Impaired fasting glucose were also pertinent to this visit.  HPI Audrey Duran presents for follow up on GAD,  CKD,  hyperlipidemia with known CAD s/p NSTEMI in 1995  And resolved ischemic cardiomyaopthy last EF  EF 50% by 2017 ECHO.  CAD:  She has been asymptomatic and had Cardiology follow up last week with [facility]   Cc:  Cough started yesterday ,  Chest congestion,  .  Had a headache last week , thought she was developing sinus congestion so she  Took sinus decongestant for a day or two.  No fevers, body aches purulent sinus drainage, facial or ear pain, no  productive sputum  Requesting antibiotics  Because last year she had same symptoms and developed CAP.  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  Lots of sick contacts.   Had the flu vaccine,  And both pneumonia vaccines    Outpatient Medications Prior to Visit  Medication Sig Dispense Refill  . acyclovir (ZOVIRAX) 400 MG tablet Take 1 tablet by mouth daily, must keep office visit appointment. 90 tablet 0  . ALPRAZolam (XANAX) 0.5 MG tablet Take 1 tablet (0.5 mg total) by mouth at bedtime as needed for sleep. 30 tablet 1  . aspirin 81 MG tablet Take 160 mg by mouth daily.      . carbonyl iron (FEOSOL) 45 MG TABS Take one by mouth daily    . Cholecalciferol (VITAMIN D3) 3000 UNITS TABS Take 1 tablet by mouth daily. Take one by mouth daily 90 tablet 3  . conjugated estrogens (PREMARIN) vaginal cream Place 1 Applicatorful vaginally at bedtime. For 2 weeks,  Then reduce use to  twice weekly 42.5 g 12  . cyanocobalamin 1000 MCG tablet Take one by mouth daily     . furosemide (LASIX) 20 MG tablet Take 1/2 tablet by mouth daily (10mg  total) 45 tablet 2  . losartan (COZAAR) 25 MG tablet Take 1 tablet (25 mg total) by mouth daily. 90 tablet 1  . magnesium oxide (MAG-OX) 400 MG tablet Take 400 mg by mouth daily.      . meclizine (ANTIVERT) 25 MG tablet Take 2 tablets (50 mg total) by mouth 3 (three) times daily as needed for dizziness or nausea. 30 tablet 1  . Omega-3 Fatty Acids (FISH OIL) 1200 MG CAPS Take one by mouth daily     . pravastatin (PRAVACHOL) 80 MG tablet Take 1 tablet by mouth every day 90 tablet 2  . TIMOLOL MALEATE PF OP Use one drop both eyes 2 times a day     . isosorbide mononitrate (IMDUR) 60 MG 24 hr tablet Take 1 tablet (60 mg total) by mouth daily. (Patient not taking: Reported on 08/26/2017) 90 tablet 1   No facility-administered medications prior to visit.     Review of Systems;  Patient denies headache, fevers, malaise, unintentional weight loss, skin rash, eye pain, sinus congestion and sinus pain, sore throat, dysphagia,  hemoptysis ,dyspnea, wheezing, chest pain, palpitations, orthopnea, edema, abdominal pain, nausea, melena, diarrhea, constipation, flank pain, dysuria, hematuria, urinary  Frequency, nocturia, numbness, tingling, seizures,  Focal  weakness, Loss of consciousness,  Tremor, insomnia, depression, anxiety, and suicidal ideation.      Objective:  BP 118/62 (BP Location: Left Arm, Patient Position: Sitting, Cuff Size: Normal)   Pulse (!) 57   Temp 97.6 F (36.4 C) (Oral)   Resp 15   Ht 5\' 3"  (1.6 m)   Wt 140 lb 6.4 oz (63.7 kg)   SpO2 95%   BMI 24.87 kg/m   BP Readings from Last 3 Encounters:  08/26/17 118/62  05/20/17 120/64  05/06/17 138/70    Wt Readings from Last 3 Encounters:  08/26/17 140 lb 6.4 oz (63.7 kg)  05/20/17 140 lb 9.6 oz (63.8 kg)  05/06/17 137 lb 1.9 oz (62.2 kg)    General appearance: alert,  cooperative and appears stated age Ears: normal TM's and external ear canals both ears Throat: lips, mucosa, and tongue normal; teeth and gums normal Neck: no adenopathy, no carotid bruit, supple, symmetrical, trachea midline and thyroid not enlarged, symmetric, no tenderness/mass/nodules Back: symmetric, no curvature. ROM normal. No CVA tenderness. Lungs: clear to auscultation bilaterally Heart: regular rate and rhythm, S1, S2 normal, no murmur, click, rub or gallop Abdomen: soft, non-tender; bowel sounds normal; no masses,  no organomegaly Pulses: 2+ and symmetric Skin: Skin color, texture, turgor normal. No rashes or lesions Lymph nodes: Cervical, supraclavicular, and axillary nodes normal.  Lab Results  Component Value Date   HGBA1C 6.0 08/26/2017   HGBA1C 5.4 05/06/2017   HGBA1C 6.1 11/16/2016    Lab Results  Component Value Date   CREATININE 1.05 08/26/2017   CREATININE 1.06 (H) 05/06/2017   CREATININE 1.12 11/16/2016    Lab Results  Component Value Date   WBC 8.7 11/16/2016   HGB 14.0 11/16/2016   HCT 42.5 11/16/2016   PLT 226.0 11/16/2016   GLUCOSE 116 (H) 08/26/2017   CHOL 151 08/26/2017   TRIG 57.0 08/26/2017   HDL 46.40 08/26/2017   LDLCALC 93 08/26/2017   ALT 16 08/26/2017   AST 17 08/26/2017   NA 136 08/26/2017   K 4.9 08/26/2017   CL 104 08/26/2017   CREATININE 1.05 08/26/2017   BUN 26 (H) 08/26/2017   CO2 27 08/26/2017   TSH 2.81 11/16/2016   INR 1.0 02/16/2012   HGBA1C 6.0 08/26/2017    Mm Screening Breast Tomo Bilateral  Result Date: 03/25/2017 CLINICAL DATA:  Screening. EXAM: 2D DIGITAL SCREENING BILATERAL MAMMOGRAM WITH CAD AND ADJUNCT TOMO COMPARISON:  Previous exam(s). ACR Breast Density Category c: The breast tissue is heterogeneously dense, which may obscure small masses. FINDINGS: There are no findings suspicious for malignancy. Images were processed with CAD. IMPRESSION: No mammographic evidence of malignancy. A result letter of this  screening mammogram will be mailed directly to the patient. RECOMMENDATION: Screening mammogram in one year. (Code:SM-B-01Y) BI-RADS CATEGORY  1: Negative. Electronically Signed   By: Pamelia Hoit M.D.   On: 03/25/2017 18:43    Assessment & Plan:   Problem List Items Addressed This Visit    Long-term use of high-risk medication - Primary   Relevant Orders   Comprehensive metabolic panel (Completed)   Prediabetes   Relevant Orders   Hemoglobin A1c (Completed)   Anxiety state    Refilled alprazolam for prn use. The risks and benefits of benzodiazepine use were reviewed with patient today including excessive sedation leading to respiratory depression,  impaired thinking/driving, and addiction.  Patient was advised to avoid concurrent use with alcohol, to use medication only as needed and not to share with  others  .       CKD (chronic kidney disease) stage 3, GFR 30-59 ml/min (HCC)    She is avoiding use of NSAIDs and her cr is stable .  Sees Nephrology twice annually.   Lab Results  Component Value Date   CREATININE 1.05 08/26/2017             COPD (chronic obstructive pulmonary disease) (HCC)    Exam currently does not support a copd exacerbation,  But she is at high risk for progression.   prednisone taper,  Tessalon perles to start,  Adding doxycyline if she develops purulent sputum.       Relevant Medications   benzonatate (TESSALON) 200 MG capsule   predniSONE (DELTASONE) 10 MG tablet   Hyperlipidemia LDL goal <70    Managed with pravastatin due to intolerance of higher potency statins.  LDL is < 100 and LFTs are normal  Lab Results  Component Value Date   CHOL 151 08/26/2017   HDL 46.40 08/26/2017   LDLCALC 93 08/26/2017   TRIG 57.0 08/26/2017   CHOLHDL 3 08/26/2017   Lab Results  Component Value Date   ALT 16 08/26/2017   AST 17 08/26/2017   ALKPHOS 57 08/26/2017   BILITOT 0.4 08/26/2017  '      Impaired fasting glucose    Her  random glucose is not   elevated but her A1c suggests she is at risk for developing diabetes.  I recommend he follow a low glycemic index diet and particpate regularly in an aerobic  exercise activity.  We should check an A1c in 6 months.    Lab Results  Component Value Date   HGBA1C 6.0 08/26/2017         Ischemic cardiomyopathy    Asymptomatic.  partipicates in regular exercise.  Has semi annual visits with [name],   Most recently January 21019  2D echocardiogram 03/03/2016 revealed normal left ventricular function, with LVEF of 50%. ETT sestamibi study 03/03/2016 revealed LV ejection fraction 59%, without evidence for scar or ischemia          Paroxysmal atrial fibrillation (HCC)    Exam is normal today.       Viral URI with cough    Her symptoms do not suggest that she has a bacterial infection and therefore does not need to take an antibiotic at this time.  The complications of inappropriate antibiotic use were discussed with patientnet including risk of clostridium dificile colitis.  advised to cancontinue the allegra and tylenol, add  sudafed PE  Up to 30 mg every 6 hours as needed to manage the sinus congestion and substitute Afrin nasal spray for the evening dose to avoid insomnia. I'll call in a prednisone taper and a cough suppressant  .  rx for doxycycline to be started only if patient develops fever,  Purulent sputum ,  Or facial/ear pain.        A total of 25 minutes of face to face time was spent with patient more than half of which was spent in counselling about the above mentioned conditions  and coordination of care   I am having Rhaelyn L. Strutz start on benzonatate, doxycycline, predniSONE, and Zoster Vaccine Adjuvanted. I am also having her maintain her TIMOLOL MALEATE PF OP, aspirin, carbonyl iron, magnesium oxide, Fish Oil, cyanocobalamin, Vitamin D3, meclizine, ALPRAZolam, conjugated estrogens, isosorbide mononitrate, losartan, acyclovir, furosemide, and pravastatin.  Meds ordered  this encounter  Medications  . benzonatate (TESSALON) 200 MG capsule  Sig: Take 1 capsule (200 mg total) by mouth 3 (three) times daily as needed for cough.    Dispense:  60 capsule    Refill:  1  . doxycycline (VIBRA-TABS) 100 MG tablet    Sig: Take 1 tablet (100 mg total) by mouth 2 (two) times daily.    Dispense:  20 tablet    Refill:  0  . predniSONE (DELTASONE) 10 MG tablet    Sig: 6 tablets on Day 1 , then reduce by 1 tablet daily until gone    Dispense:  21 tablet    Refill:  0  . Zoster Vaccine Adjuvanted [facility]) injection    Sig: Inject 0.5 mLs into the muscle once for 1 dose.    Dispense:  1 each    Refill:  1    There are no discontinued medications.  Follow-up: Return in about 6 months (around 02/23/2018) for prediabets, hypertension .   Crecencio Mc, MD

## 2017-08-26 NOTE — Patient Instructions (Addendum)
You have a viral syndrome which is causing bronchitis.    The post nasal drip may also be contributing to  your  Cough.  Using antibiotics this early in an infection  that is probably viral increases your risk of a dangerous diarrheal infection called "c dificile colitis"   I am prescribing a prednisone  taper  And a cough suppressant   to manage the inflammation in your bronchial tubes   I also advise use of the following OTC meds to help with your other symptoms.   Continue generic OTC benadryl 25 mg  At bedtime for the drainage,,  tessalon perles or Delsym for daytime cough. flush your sinuses twice daily with Milta Deiters Meds sinus sinse   Your coughing may last 7 to 10 days.  If you develop a  fever.   ( T > 100.4, ) or sputum that is discolored,  Call the office and I will order a chest x ray.    You can  start the antibiotic doxycycline at that time  .   If you start the doxycycline,  Please take a probiotic ( Align, Floraque or Culturelle) while you are on the antibiotic to prevent the serious antibiotic associated dipredarrhea  Called clostridium dificile colitis and a vaginal yeast infection

## 2017-08-29 DIAGNOSIS — B9789 Other viral agents as the cause of diseases classified elsewhere: Secondary | ICD-10-CM

## 2017-08-29 DIAGNOSIS — J069 Acute upper respiratory infection, unspecified: Secondary | ICD-10-CM | POA: Insufficient documentation

## 2017-08-29 DIAGNOSIS — R7301 Impaired fasting glucose: Secondary | ICD-10-CM | POA: Insufficient documentation

## 2017-08-29 NOTE — Assessment & Plan Note (Addendum)
Her symptoms do not suggest that she has a bacterial infection and therefore does not need to take an antibiotic at this time.  The complications of inappropriate antibiotic use were discussed with patientnet including risk of clostridium dificile colitis.  advised to cancontinue the allegra and tylenol, add  sudafed PE  Up to 30 mg every 6 hours as needed to manage the sinus congestion and substitute Afrin nasal spray for the evening dose to avoid insomnia. I'll call in a prednisone taper and a cough suppressant  .  rx for doxycycline to be started only if patient develops fever,  Purulent sputum ,  Or facial/ear pain.

## 2017-08-29 NOTE — Assessment & Plan Note (Signed)
Refilled alprazolam for prn use. The risks and benefits of benzodiazepine use were reviewed with patient today including excessive sedation leading to respiratory depression,  impaired thinking/driving, and addiction.  Patient was advised to avoid concurrent use with alcohol, to use medication only as needed and not to share with others  .

## 2017-08-29 NOTE — Assessment & Plan Note (Signed)
Managed with pravastatin due to intolerance of higher potency statins.  LDL is < 100 and LFTs are normal  Lab Results  Component Value Date   CHOL 151 08/26/2017   HDL 46.40 08/26/2017   LDLCALC 93 08/26/2017   TRIG 57.0 08/26/2017   CHOLHDL 3 08/26/2017   Lab Results  Component Value Date   ALT 16 08/26/2017   AST 17 08/26/2017   ALKPHOS 57 08/26/2017   BILITOT 0.4 08/26/2017  '

## 2017-08-29 NOTE — Assessment & Plan Note (Signed)
Exam is normal today.

## 2017-08-29 NOTE — Assessment & Plan Note (Signed)
Asymptomatic.  partipicates in regular exercise.  Has semi annual visits with [name],   Most recently January 21019  2D echocardiogram 03/03/2016 revealed normal left ventricular function, with LVEF of 50%. ETT sestamibi study 03/03/2016 revealed LV ejection fraction 59%, without evidence for scar or ischemia

## 2017-08-29 NOTE — Assessment & Plan Note (Signed)
Exam currently does not support a copd exacerbation,  But she is at high risk for progression.   prednisone taper,  Tessalon perles to start,  Adding doxycyline if she develops purulent sputum.

## 2017-08-29 NOTE — Assessment & Plan Note (Signed)
Her  random glucose is not  elevated but her A1c suggests she is at risk for developing diabetes.  I recommend he follow a low glycemic index diet and particpate regularly in an aerobic  exercise activity.  We should check an A1c in 6 months.    Lab Results  Component Value Date   HGBA1C 6.0 08/26/2017

## 2017-08-29 NOTE — Assessment & Plan Note (Signed)
She is avoiding use of NSAIDs and her cr is stable .  Sees Nephrology twice annually.   Lab Results  Component Value Date   CREATININE 1.05 08/26/2017

## 2017-11-01 ENCOUNTER — Other Ambulatory Visit: Payer: Self-pay | Admitting: Internal Medicine

## 2017-11-01 DIAGNOSIS — B009 Herpesviral infection, unspecified: Secondary | ICD-10-CM

## 2017-11-24 ENCOUNTER — Telehealth: Payer: Self-pay | Admitting: Internal Medicine

## 2017-11-24 ENCOUNTER — Other Ambulatory Visit: Payer: Self-pay | Admitting: Internal Medicine

## 2017-11-24 NOTE — Telephone Encounter (Signed)
Spoke with pt and advised that even though she has had the old zostavax vaccine that she should still get the newest shingles vaccine. Advised the pt that she would need to get it at a pharmacy that her insurance will not pay for her to get it here. Pt gave a verbal understanding.

## 2017-11-24 NOTE — Telephone Encounter (Signed)
Please advise 

## 2017-11-24 NOTE — Telephone Encounter (Signed)
Copied from [place]. Topic: Quick Communication - See Telephone Encounter >> Nov 24, 2017  2:00 PM Cleaster Corin, NT wrote: CRM for notification. See Telephone encounter for: 11/24/17.  Pt. Needing to know if she needs to have the Shingrix pt. Would like for a nurse to give her a call back with that information.

## 2017-12-21 ENCOUNTER — Other Ambulatory Visit: Payer: Self-pay | Admitting: Internal Medicine

## 2017-12-21 DIAGNOSIS — B009 Herpesviral infection, unspecified: Secondary | ICD-10-CM

## 2017-12-23 ENCOUNTER — Other Ambulatory Visit: Payer: Self-pay

## 2017-12-23 MED ORDER — LOSARTAN POTASSIUM 25 MG PO TABS: 25.0000 mg | ORAL_TABLET | Freq: Every day | ORAL | 1 refills | Status: DC

## 2017-12-23 NOTE — Progress Notes (Unsigned)
Patient walked in stating that her medication was not at the pharmacy. I have resent her Losartan to [facility] on [address].

## 2017-12-28 ENCOUNTER — Other Ambulatory Visit: Payer: Self-pay | Admitting: Internal Medicine

## 2017-12-28 DIAGNOSIS — H40003 Preglaucoma, unspecified, bilateral: Secondary | ICD-10-CM | POA: Diagnosis not present

## 2017-12-28 MED ORDER — ALPRAZOLAM 0.5 MG PO TABS: 0.5000 mg | ORAL_TABLET | Freq: Every evening | ORAL | 3 refills | Status: DC | PRN

## 2017-12-28 NOTE — Telephone Encounter (Signed)
Spoke with [facility] and they stated that the last rx they received for Alprazolam was in January. Pt is requesting a refill.   Last OV: 08/26/2017 Next OV: 02/24/2018

## 2017-12-28 NOTE — Telephone Encounter (Signed)
Copied from [id number]. Topic: Quick Communication - See Telephone Encounter >> Dec 28, 2017  8:46 AM Ether Griffins B wrote: CRM for notification. See Telephone encounter for: 12/28/17.  Pt would like a refill on ALPRAZolam (XANAX) 0.5 MG tablet  sent to [facility] [place]

## 2017-12-29 NOTE — Telephone Encounter (Signed)
Printed, signed and faxed.  

## 2018-02-17 DIAGNOSIS — J449 Chronic obstructive pulmonary disease, unspecified: Secondary | ICD-10-CM | POA: Diagnosis not present

## 2018-02-17 DIAGNOSIS — I10 Essential (primary) hypertension: Secondary | ICD-10-CM | POA: Diagnosis not present

## 2018-02-17 DIAGNOSIS — I255 Ischemic cardiomyopathy: Secondary | ICD-10-CM | POA: Diagnosis not present

## 2018-02-17 DIAGNOSIS — Z9889 Other specified postprocedural states: Secondary | ICD-10-CM | POA: Diagnosis not present

## 2018-02-17 DIAGNOSIS — I214 Non-ST elevation (NSTEMI) myocardial infarction: Secondary | ICD-10-CM | POA: Diagnosis not present

## 2018-02-24 ENCOUNTER — Ambulatory Visit (INDEPENDENT_AMBULATORY_CARE_PROVIDER_SITE_OTHER): Payer: PPO | Admitting: Internal Medicine

## 2018-02-24 ENCOUNTER — Other Ambulatory Visit: Payer: Self-pay | Admitting: Internal Medicine

## 2018-02-24 ENCOUNTER — Encounter: Payer: Self-pay | Admitting: Internal Medicine

## 2018-02-24 VITALS — BP 136/78 | HR 59 | Temp 97.7°F | Resp 15 | Ht 63.0 in | Wt 134.4 lb

## 2018-02-24 DIAGNOSIS — M8580 Other specified disorders of bone density and structure, unspecified site: Secondary | ICD-10-CM | POA: Diagnosis not present

## 2018-02-24 DIAGNOSIS — M85851 Other specified disorders of bone density and structure, right thigh: Secondary | ICD-10-CM | POA: Diagnosis not present

## 2018-02-24 DIAGNOSIS — R7303 Prediabetes: Secondary | ICD-10-CM | POA: Diagnosis not present

## 2018-02-24 DIAGNOSIS — I480 Paroxysmal atrial fibrillation: Secondary | ICD-10-CM | POA: Diagnosis not present

## 2018-02-24 DIAGNOSIS — R5383 Other fatigue: Secondary | ICD-10-CM

## 2018-02-24 DIAGNOSIS — E785 Hyperlipidemia, unspecified: Secondary | ICD-10-CM | POA: Diagnosis not present

## 2018-02-24 DIAGNOSIS — Z1231 Encounter for screening mammogram for malignant neoplasm of breast: Secondary | ICD-10-CM

## 2018-02-24 DIAGNOSIS — R7301 Impaired fasting glucose: Secondary | ICD-10-CM

## 2018-02-24 DIAGNOSIS — E559 Vitamin D deficiency, unspecified: Secondary | ICD-10-CM

## 2018-02-24 DIAGNOSIS — Z0000 Encounter for general adult medical examination without abnormal findings: Secondary | ICD-10-CM | POA: Diagnosis not present

## 2018-02-24 LAB — LIPID PANEL
CHOLESTEROL: 145 mg/dL (ref 0–200)
HDL: 56 mg/dL (ref >39.00)
LDL CALC: 79 mg/dL (ref 0–99)
NonHDL: 89.39
Total CHOL/HDL Ratio: 3
Triglycerides: 50 mg/dL (ref 0.0–149.0)
VLDL: 10 mg/dL (ref 0.0–40.0)

## 2018-02-24 LAB — TSH: TSH: 2.69 u[IU]/mL (ref 0.35–4.50)

## 2018-02-24 LAB — COMPREHENSIVE METABOLIC PANEL
ALBUMIN: 4 g/dL (ref 3.5–5.2)
ALK PHOS: 54 U/L (ref 39–117)
ALT: 15 U/L (ref 0–35)
AST: 16 U/L (ref 0–37)
BUN: 20 mg/dL (ref 6–23)
CHLORIDE: 107 meq/L (ref 96–112)
CO2: 25 mEq/L (ref 19–32)
Calcium: 9.9 mg/dL (ref 8.4–10.5)
Creatinine, Ser: 0.96 mg/dL (ref 0.40–1.20)
GFR: 59.33 mL/min — AB (ref >60.00)
Glucose, Bld: 118 mg/dL — ABNORMAL HIGH (ref 70–99)
POTASSIUM: 4.1 meq/L (ref 3.5–5.1)
SODIUM: 139 meq/L (ref 135–145)
Total Bilirubin: 0.5 mg/dL (ref 0.2–1.2)
Total Protein: 7.2 g/dL (ref 6.0–8.3)

## 2018-02-24 LAB — CBC WITH DIFFERENTIAL/PLATELET
BASOS ABS: 0.1 10*3/uL (ref 0.0–0.1)
Basophils Relative: 0.7 % (ref 0.0–3.0)
Eosinophils Absolute: 0.2 10*3/uL (ref 0.0–0.7)
Eosinophils Relative: 1.9 % (ref 0.0–5.0)
HEMATOCRIT: 40.6 % (ref 36.0–46.0)
HEMOGLOBIN: 13.6 g/dL (ref 12.0–15.0)
LYMPHS PCT: 21 % (ref 12.0–46.0)
Lymphs Abs: 1.9 10*3/uL (ref 0.7–4.0)
MCHC: 33.6 g/dL (ref 30.0–36.0)
MCV: 93.4 fl (ref 78.0–100.0)
MONOS PCT: 6.5 % (ref 3.0–12.0)
Monocytes Absolute: 0.6 10*3/uL (ref 0.1–1.0)
NEUTROS ABS: 6.3 10*3/uL (ref 1.4–7.7)
Neutrophils Relative %: 69.9 % (ref 43.0–77.0)
Platelets: 177 10*3/uL (ref 150.0–400.0)
RBC: 4.34 Mil/uL (ref 3.87–5.11)
RDW: 13.6 % (ref 11.5–15.5)
WBC: 9 10*3/uL (ref 4.0–10.5)

## 2018-02-24 LAB — HEMOGLOBIN A1C: HEMOGLOBIN A1C: 6 % (ref 4.6–6.5)

## 2018-02-24 LAB — VITAMIN D 25 HYDROXY (VIT D DEFICIENCY, FRACTURES): VITD: 43.06 ng/mL (ref 30.00–100.00)

## 2018-02-24 MED ORDER — ALENDRONATE SODIUM 70 MG PO TABS: 70.0000 mg | ORAL_TABLET | ORAL | 3 refills | Status: DC

## 2018-02-24 NOTE — Patient Instructions (Addendum)
I am prescribing alendronate (generic for Fosamax)  ONCE A WEEK to help make your bones stronger  You should also take a calcium supplement daily along with Vitamin D  1000 Ius  Daily  (more if your level today is low)  Alendronate; Cholecalciferol tablets What is this medicine? ALENDRONATE; CHOLECALCIFEROL (a LEN droe nate; KOL e cal SIF er ol) has two medicines to help reduce calcium loss from bones and to increase the production of normal, healthy bone in patients with osteoporosis. This medicine may be used for other purposes; ask your health care provider or pharmacist if you have questions. COMMON BRAND NAME(S): Fosamax Plus D What should I tell my health care provider before I take this medicine? They need to know if you have any of these conditions: - dental disease -kidney disease -low level or high level of blood calcium -problems sitting or standing for 30 minutes -problems swallowing -stomach, intestine, or esophagus problems like acid reflux or GERD -vitamin D toxicity -an unusual or allergic reaction to alendronate, cholecalciferol, medicines, foods, lactose, dyes, or preservatives -pregnant or trying to get pregnant -breast-feeding How should I use this medicine? You must take this medicine exactly as directed or you will lower the amount of medicine you absorb into your body or you may cause yourself harm. Take your dose by mouth first thing in the morning, after you are up for the day. Do not eat or drink anything before you take this medicine. Swallow your medicine with a full glass (6 to 8 fluid ounces) of plain water. Do not take this tablet with any with any other drink. Follow the directions on the prescription label. After taking this medicine, do not eat breakfast, drink, or take any medicines or vitamins for at least 30 minutes. Stand or sit up for at least 30 minutes after you take this medicine; do not lie down. Take the medicine on the same day every week. Do not  take your medicine more often than directed. Do not stop taking except on your doctor's advice. Talk to your pediatrician regarding the use of this medicine in children. Special care may be needed. Overdosage: If you think you have taken too much of this medicine contact a poison control center or emergency room at once. NOTE: This medicine is only for you. Do not share this medicine with others. What if I miss a dose? If you miss a dose, take the dose on the morning after you remember. Take your next dose on your regular chosen day of the week, but do not take 2 tablets on the same day. Do not take double or extra doses. What may interact with this medicine? -antacids -aspirin and aspirin like drugs -calcium supplements, especially calcium with vitamin D -cholestyramine or colestipol -cimetidine, ranitidine, or other medicines used to decrease stomach acid -corticosteroids -iron supplements -magnesium supplements -mineral oil -NSAIDs, medicines for pain and inflammation, like ibuprofen or naproxen -orlistat -phenobarbital -phenytoin -phosphorous supplements -primidone -steroid medicines like prednisone or cortisone -thiazide diuretics -vitamins with minerals, especially with vitamin D This list may not describe all possible interactions. Give your health care provider a list of all the medicines, herbs, non-prescription drugs, or dietary supplements you use. Also tell them if you smoke, drink alcohol, or use illegal drugs. Some items may interact with your medicine. What should I watch for while using this medicine? Visit your doctor or health care professional for regular checkups. It may be some time before you see the benefit from this  medicine. Do not stop taking your medicine unless your doctor tells you to. Your doctor may order blood tests or other tests to see how you are doing. You should make sure that you get enough calcium and vitamin D while you are taking this medicine.  Discuss the foods you eat and the vitamins you take with your health care professional. If you have pain when swallowing, difficulty swallowing, heartburn, or stomach pain, immediately call your doctor or health care professional. If you are taking an antacid, a mineral supplement like calcium or iron, or a vitamin with minerals, wait to take them at least 30 minutes after you take this medicine. Do not take them at same time. This medicine can make you more sensitive to the sun. If you get a rash while taking this medicine, sunlight may cause the rash to get worse. Keep out of the sun. If you cannot avoid being in the sun, wear protective clothing and use sunscreen. Do not use sun lamps or tanning beds/booths. Some people who take this medicine have severe bone, joint, and/or muscle pain. This medicine may also increase your risk for a broken thigh bone. Tell your doctor right away if you have pain in your upper leg or groin. Tell your doctor if you have any pain that does not go away or that gets worse. What side effects may I notice from receiving this medicine? Side effects that you should report to your doctor or health care professional as soon as possible: -allergic reactions like skin rash, itching or hives, swelling of the face, lips, or tongue -bone, muscle, or joint pain -changes in vision -heartburn or chest pain -pain or difficulty swallowing -redness, blistering, peeling or loosening of the skin, including inside the mouth -stomach pain -unusual bleeding or bruising -unusually weak or tired -vomiting Side effects that usually do not require medical attention (report to your doctor or health care professional if they continue or are bothersome): -diarrhea or constipation -headache -nausea -stomach gas or fullness This list may not describe all possible side effects. Call your doctor for medical advice about side effects. You may report side effects to FDA at 1-800-FDA-1088. Where  should I keep my medicine? Keep out of the reach of children. Store at room temperature between 20 and 25 degrees C (68 and 77 degrees F). Protect the medicine from moisture and light. Throw away any unused medicine after the expiration date. NOTE: This sheet is a summary. It may not cover all possible information. If you have questions about this medicine, talk to your doctor, pharmacist, or health care provider.  2018 Elsevier/Gold Standard (2011-01-09 08:57:31)

## 2018-02-24 NOTE — Progress Notes (Signed)
Patient ID: Audrey Duran, female    DOB: 1935/02/18  Age: 83 y.o. MRN: [id number]  The patient is here for annual  Preventive examination and management of other chronic and acute problems.   The risk factors are reflected in the social history.  The roster of all physicians providing medical care to patient - is listed in the Snapshot section of the chart.  Activities of daily living:  The patient is 100% independent in all ADLs: dressing, toileting, feeding as well as independent mobility  Home safety : The patient has smoke detectors in the home. They wear seatbelts.  There are no firearms at home. There is no violence in the home.   There is no risks for hepatitis, STDs or HIV. There is no   history of blood transfusion. They have no travel history to infectious disease endemic areas of the world.  The patient has seen their dentist in the last six month. They have seen their eye doctor in the last year. They admit to slight hearing difficulty with regard to whispered voices and some television programs.  They have deferred audiologic testing in the last year.  They do not  have excessive sun exposure. Discussed the need for sun protection: hats, long sleeves and use of sunscreen if there is significant sun exposure.   Diet: the importance of a healthy diet is discussed. They do have a healthy diet.  The benefits of regular aerobic exercise were discussed. She walks 4 times per week ,  20 minutes.   Depression screen: there are no signs or vegative symptoms of depression- irritability, change in appetite, anhedonia, sadness/tearfullness.  Cognitive assessment: the patient manages all their financial and personal affairs and is actively engaged. They could relate day,date,year and events; recalled 2/3 objects at 3 minutes; performed clock-face test normally.  The following portions of the patient's history were reviewed and updated as appropriate: allergies, current medications, past  family history, past medical history,  past surgical history, past social history  and problem list.  Visual acuity was not assessed per patient preference since she has regular follow up with her ophthalmologist. Hearing and body mass index were assessed and reviewed.   During the course of the visit the patient was educated and counseled about appropriate screening and preventive services including : fall prevention , diabetes screening, nutrition counseling, colorectal cancer screening, and recommended immunizations.    CC: The primary encounter diagnosis was Encounter for preventive health examination. Diagnoses of Prediabetes, Hyperlipidemia LDL goal <70, Fatigue, unspecified type, Osteopenia of right hip, Vitamin D deficiency, Osteopenia of the elderly, Paroxysmal atrial fibrillation ([place]), and Impaired fasting glucose were also pertinent to this visit.   6 month follow upon hypertension, prediabetes and hyperlipidemia managed with statin therapy. Hypertension: patient checks blood pressure twice weekly at home.  Readings have been for the most part < 140/80 at rest.  She did not take her blood pressure medictions today. . 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  The children are K through 5th grade.   Has osteopenia   No change since 2014 . History of  right hip fracture 8 years ago , slipped while working with the lawn mower    History Georganne has a past medical history  of Anemia, Atrial fibrillation ([place]), COPD (chronic obstructive pulmonary disease) ([place]), Coronary artery disease (1995), Hyperlipidemia, Ischemic cardiomyopathy (Dec 2011), Myocardial infarction [facility]), and Recurrent HSV (herpes simplex virus).   She has a past surgical history that includes Cataract extraction (2004, 2006); Carotid endarterectomy  (July 2007); Joint replacement (July 2013); Abdominal hysterectomy (june 2014); and Breast biopsy (Left).   Her family history includes Breast cancer (age of onset: 55) in her sister; Cancer (age of onset: 46) in her sister; Cancer (age of onset: 26) in her mother; Heart disease in her father.She reports that she quit smoking about 26 years ago. She has never used smokeless tobacco. She reports that she does not drink alcohol or use drugs.  Outpatient Medications Prior to Visit  Medication Sig Dispense Refill  . acyclovir (ZOVIRAX) 400 MG tablet Take 1 tablet (400 mg total) by mouth daily. 90 tablet 1  . ALPRAZolam (XANAX) 0.5 MG tablet Take 1 tablet (0.5 mg total) by mouth at bedtime as needed for sleep. 30 tablet 3  . aspirin 81 MG tablet Take 160 mg by mouth daily.      . carbonyl iron (FEOSOL) 45 MG TABS Take one by mouth daily    . Cholecalciferol (VITAMIN D3) 3000 UNITS TABS Take 1 tablet by mouth daily. Take one by mouth daily 90 tablet 3  . conjugated estrogens (PREMARIN) vaginal cream Place 1 Applicatorful vaginally at bedtime. For 2 weeks,  Then reduce use to twice weekly 42.5 g 12  . cyanocobalamin 1000 MCG tablet Take one by mouth daily     . furosemide (LASIX) 20 MG tablet Take 1/2 tablet by mouth daily (10mg  total) 45 tablet 2  . isosorbide mononitrate (IMDUR) 60 MG 24 hr tablet Take 1 tablet by mouth every day 90 tablet 1  . losartan (COZAAR) 25 MG tablet Take 1 tablet by mouth once daily 90 tablet 1  . magnesium oxide (MAG-OX) 400 MG tablet Take 400 mg by mouth daily.      . Omega-3 Fatty Acids (FISH OIL) 1200 MG CAPS Take one by mouth daily     . pravastatin (PRAVACHOL) 80 MG tablet Take 1 tablet by mouth every day 90 tablet 2  . TIMOLOL MALEATE PF OP Use one drop both eyes 2 times a day     . meclizine (ANTIVERT) 25 MG tablet Take 2 tablets (50 mg total) by mouth 3 (three) times daily as needed for dizziness or nausea. (Patient not taking: Reported on 02/24/2018) 30 tablet 1  .  benzonatate (TESSALON) 200 MG capsule Take 1 capsule (200 mg total) by mouth 3 (three) times daily as needed for cough. (Patient not taking: Reported on 02/24/2018) 60 capsule 1  . doxycycline (VIBRA-TABS) 100 MG tablet Take 1 tablet (100 mg total) by mouth 2 (two) times daily. (Patient not taking: Reported on 02/24/2018) 20 tablet 0  . losartan (COZAAR) 25 MG tablet Take 1 tablet (25 mg total) by mouth daily. (Patient not taking: Reported on 02/24/2018) 90 tablet 1  . predniSONE (DELTASONE) 10 MG tablet 6 tablets on Day 1 , then reduce by 1 tablet daily until gone (Patient not taking: Reported on 02/24/2018) 21 tablet 0   No facility-administered medications prior to visit.     Review of Systems   Patient denies headache, fevers, malaise, unintentional weight loss, skin rash, eye pain, sinus congestion and sinus pain, sore throat, dysphagia,  hemoptysis , cough, dyspnea, wheezing, chest pain, palpitations, orthopnea, edema, abdominal pain, nausea, melena, diarrhea, constipation, flank  pain, dysuria, hematuria, urinary  Frequency, nocturia, numbness, tingling, seizures,  Focal weakness, Loss of consciousness,  Tremor, insomnia, depression, anxiety, and suicidal ideation.      Objective:  BP 136/78 (BP Location: Left Arm, Patient Position: Sitting, Cuff Size: Normal)   Pulse (!) 59   Temp 97.7 F (36.5 C) (Oral)   Resp 15   Ht 5\' 3"  (1.6 m)   Wt 134 lb 6.4 oz (61 kg)   SpO2 96%   BMI 23.81 kg/m   Physical Exam  General appearance: alert, cooperative and appears stated age Ears: normal TM's and external ear canals both ears Throat: lips, mucosa, and tongue normal; teeth and gums normal Neck: no adenopathy, no carotid bruit, supple, symmetrical, trachea midline and thyroid not enlarged, symmetric, no tenderness/mass/nodules Back: symmetric, no curvature. ROM normal. No CVA tenderness. Lungs: clear to auscultation bilaterally Heart: regular rate and rhythm, S1, S2 normal, no murmur, click, rub  or gallop Abdomen: soft, non-tender; bowel sounds normal; no masses,  no organomegaly Pulses: 2+ and symmetric Skin: Skin color, texture, turgor normal. No rashes or lesions Lymph nodes: Cervical, supraclavicular, and axillary nodes normal.   Assessment & Plan:   Problem List Items Addressed This Visit    Hyperlipidemia LDL goal <70    Managed with pravastatin due to intolerance of higher potency statins.  LDL is < 100 and LFTs are normal  Lab Results  Component Value Date   CHOL 145 02/24/2018   HDL 56.00 02/24/2018   LDLCALC 79 02/24/2018   TRIG 50.0 02/24/2018   CHOLHDL 3 02/24/2018   Lab Results  Component Value Date   ALT 15 02/24/2018   AST 16 02/24/2018   ALKPHOS 54 02/24/2018   BILITOT 0.5 02/24/2018  '      Relevant Orders   Lipid panel (Completed)   Paroxysmal atrial fibrillation ([place])    Exam is normal today.       Encounter for preventive health examination - Primary    Annual comprehensive preventive exam was done as well as an evaluation and management of chronic conditions .  During the course of the visit the patient was educated and counseled about appropriate screening and preventive services including :  diabetes screening, lipid analysis with projected  10 year  risk for CAD , nutrition counseling, breast, cervical and colorectal cancer screening, and recommended immunizations.  Printed recommendations for health maintenance screenings was given      Osteopenia of the elderly    With prior hip fracture.  Starting alendronate       Vitamin D deficiency   Relevant Orders   VITAMIN D 25 Hydroxy (Vit-D Deficiency, Fractures) (Completed)   Prediabetes   Relevant Orders   Comprehensive metabolic panel (Completed)   Hemoglobin A1c (Completed)   Impaired fasting glucose    Her  random glucose is not  elevated but her A1c suggests she is at risk for developing diabetes.  I recommend he follow a low glycemic index diet and particpate regularly in an  aerobic  exercise activity.  We should check an A1c in 6 months.    Lab Results  Component Value Date   HGBA1C 6.0 02/24/2018          Other Visit Diagnoses    Fatigue, unspecified type       Relevant Orders   TSH (Completed)   CBC with Differential/Platelet (Completed)   Osteopenia of right hip       Relevant Orders   VITAMIN D 25 Hydroxy (  Vit-D Deficiency, Fractures) (Completed)      I have discontinued Aijalon L. Grunwald's benzonatate, doxycycline, and predniSONE. I am also having her start on alendronate. Additionally, I am having her maintain her TIMOLOL MALEATE PF OP, aspirin, carbonyl iron, magnesium oxide, Fish Oil, cyanocobalamin, Vitamin D3, meclizine, conjugated estrogens, furosemide, pravastatin, isosorbide mononitrate, acyclovir, losartan, and ALPRAZolam.  Meds ordered this encounter  Medications  . alendronate (FOSAMAX) 70 MG tablet    Sig: Take 1 tablet (70 mg total) by mouth every 7 (seven) days. Take with a full glass of water on an empty stomach.    Dispense:  12 tablet    Refill:  3    Medications Discontinued During This Encounter  Medication Reason  . benzonatate (TESSALON) 200 MG capsule Completed Course  . doxycycline (VIBRA-TABS) 100 MG tablet Completed Course  . losartan (COZAAR) 25 MG tablet Duplicate  . predniSONE (DELTASONE) 10 MG tablet Completed Course    Follow-up: Return in about 6 months (around 08/27/2018) for cpe .   Crecencio Mc, MD

## 2018-02-25 NOTE — Assessment & Plan Note (Signed)
Exam is normal today.

## 2018-02-25 NOTE — Assessment & Plan Note (Signed)
Annual comprehensive preventive exam was done as well as an evaluation and management of chronic conditions .  During the course of the visit the patient was educated and counseled about appropriate screening and preventive services including :  diabetes screening, lipid analysis with projected  10 year  risk for CAD , nutrition counseling, breast, cervical and colorectal cancer screening, and recommended immunizations.  Printed recommendations for health maintenance screenings was given 

## 2018-02-25 NOTE — Assessment & Plan Note (Signed)
Managed with pravastatin due to intolerance of higher potency statins.  LDL is < 100 and LFTs are normal  Lab Results  Component Value Date   CHOL 145 02/24/2018   HDL 56.00 02/24/2018   LDLCALC 79 02/24/2018   TRIG 50.0 02/24/2018   CHOLHDL 3 02/24/2018   Lab Results  Component Value Date   ALT 15 02/24/2018   AST 16 02/24/2018   ALKPHOS 54 02/24/2018   BILITOT 0.5 02/24/2018  '

## 2018-02-25 NOTE — Assessment & Plan Note (Signed)
Her  random glucose is not  elevated but her A1c suggests she is at risk for developing diabetes.  I recommend he follow a low glycemic index diet and particpate regularly in an aerobic  exercise activity.  We should check an A1c in 6 months.    Lab Results  Component Value Date   HGBA1C 6.0 02/24/2018

## 2018-02-25 NOTE — Assessment & Plan Note (Signed)
With prior hip fracture.  Starting alendronate

## 2018-03-29 ENCOUNTER — Other Ambulatory Visit: Payer: Self-pay | Admitting: Internal Medicine

## 2018-03-29 ENCOUNTER — Ambulatory Visit
Admission: RE | Admit: 2018-03-29 | Discharge: 2018-03-29 | Disposition: A | Discharge status: Home / Self Care | Payer: PPO | Source: Ambulatory Visit | Attending: Internal Medicine | Admitting: Internal Medicine

## 2018-03-29 DIAGNOSIS — Z1231 Encounter for screening mammogram for malignant neoplasm of breast: Secondary | ICD-10-CM | POA: Diagnosis not present

## 2018-03-29 DIAGNOSIS — R928 Other abnormal and inconclusive findings on diagnostic imaging of breast: Secondary | ICD-10-CM

## 2018-03-29 DIAGNOSIS — R921 Mammographic calcification found on diagnostic imaging of breast: Secondary | ICD-10-CM

## 2018-04-01 ENCOUNTER — Encounter: Payer: Self-pay | Admitting: *Deleted

## 2018-04-08 ENCOUNTER — Encounter: Payer: Self-pay | Admitting: Internal Medicine

## 2018-04-08 ENCOUNTER — Ambulatory Visit
Admission: RE | Admit: 2018-04-08 | Discharge: 2018-04-08 | Disposition: A | Discharge status: Home / Self Care | Payer: PPO | Source: Ambulatory Visit | Attending: Internal Medicine | Admitting: Internal Medicine

## 2018-04-08 ENCOUNTER — Other Ambulatory Visit: Payer: Self-pay | Admitting: Internal Medicine

## 2018-04-08 DIAGNOSIS — R928 Other abnormal and inconclusive findings on diagnostic imaging of breast: Secondary | ICD-10-CM | POA: Diagnosis not present

## 2018-04-08 DIAGNOSIS — R921 Mammographic calcification found on diagnostic imaging of breast: Secondary | ICD-10-CM | POA: Diagnosis not present

## 2018-04-18 ENCOUNTER — Telehealth: Payer: Self-pay

## 2018-04-18 NOTE — Telephone Encounter (Signed)
LMTCB. PEC may speak with pt.  

## 2018-04-18 NOTE — Telephone Encounter (Signed)
Copied from [phone]. Topic: General - Other >> Apr 18, 2018 11:35 AM Carolyn Stare wrote:  Pt call to say she made the decision to wait 6 months and have another Mammogram cause the test said it was benign so she decided not to have a BX . She is asking if Dr Derrel Nip thinks she made the correct decision not to have BX

## 2018-04-18 NOTE — Telephone Encounter (Signed)
I would reconsider, given that your sister had breast cancer.

## 2018-05-10 ENCOUNTER — Ambulatory Visit: Payer: PPO

## 2018-05-10 ENCOUNTER — Other Ambulatory Visit: Payer: Self-pay | Admitting: Internal Medicine

## 2018-05-10 NOTE — Telephone Encounter (Signed)
Got in touch with pt and she stated that she received a letter stating that her insurance would not cover the biopsy and that is why she has chosen not to do the biopsy.

## 2018-05-31 ENCOUNTER — Encounter: Payer: Self-pay | Admitting: Internal Medicine

## 2018-06-21 ENCOUNTER — Telehealth: Payer: Self-pay

## 2018-06-21 NOTE — Telephone Encounter (Signed)
Pt had a dexa scan in 2018.

## 2018-06-21 NOTE — Telephone Encounter (Signed)
Copied from [place] [phone]. Topic: General - Inquiry >> Jun 21, 2018  8:24 AM Conception Chancy, NT wrote: Reason for CRM: patient is calling and states she tried to schedule her bone density with [facility] and they told her that she needed an order.

## 2018-06-21 NOTE — Telephone Encounter (Signed)
She should be takig alendronate.  And she does not need dexa  Every year

## 2018-06-21 NOTE — Telephone Encounter (Signed)
Copied from [place] [id number]. Topic: General - Inquiry >> Jun 21, 2018  8:24 AM Conception Chancy, NT wrote: Reason for CRM: patient is calling and states she tried to schedule her bone density with [facility] and they told her that she needed an order.

## 2018-06-28 MED ORDER — ALPRAZOLAM 0.5 MG PO TABS: 0.5000 mg | ORAL_TABLET | Freq: Every evening | ORAL | 3 refills | Status: DC | PRN

## 2018-06-28 NOTE — Addendum Note (Signed)
Addended by: Crecencio Mc on: 06/28/2018 11:14 AM   Modules accepted: Orders

## 2018-06-28 NOTE — Telephone Encounter (Signed)
Alprazolam refilled.  

## 2018-06-28 NOTE — Telephone Encounter (Signed)
Spoke with pt to let her know that she does need to be taking the fosamax and she stated that she is. Also let the pt know that she is not due for another dexa scan until July of 2020. Pt gave a verbal understanding. While on the phone with the pt she stated that he brother just passed away and she just had to refill her last prescription of alprazolam. Pt is wanting to know if she can get a more refills sent in.

## 2018-07-05 DIAGNOSIS — H40003 Preglaucoma, unspecified, bilateral: Secondary | ICD-10-CM | POA: Diagnosis not present

## 2018-07-14 ENCOUNTER — Other Ambulatory Visit: Payer: Self-pay | Admitting: Internal Medicine

## 2018-07-14 DIAGNOSIS — B009 Herpesviral infection, unspecified: Secondary | ICD-10-CM

## 2018-08-23 DIAGNOSIS — Z9889 Other specified postprocedural states: Secondary | ICD-10-CM | POA: Diagnosis not present

## 2018-08-23 DIAGNOSIS — R079 Chest pain, unspecified: Secondary | ICD-10-CM | POA: Diagnosis not present

## 2018-08-23 DIAGNOSIS — I214 Non-ST elevation (NSTEMI) myocardial infarction: Secondary | ICD-10-CM | POA: Diagnosis not present

## 2018-08-23 DIAGNOSIS — I255 Ischemic cardiomyopathy: Secondary | ICD-10-CM | POA: Diagnosis not present

## 2018-08-23 DIAGNOSIS — I10 Essential (primary) hypertension: Secondary | ICD-10-CM | POA: Diagnosis not present

## 2018-08-23 DIAGNOSIS — J449 Chronic obstructive pulmonary disease, unspecified: Secondary | ICD-10-CM | POA: Diagnosis not present

## 2018-08-29 ENCOUNTER — Ambulatory Visit: Payer: Self-pay

## 2018-08-29 ENCOUNTER — Ambulatory Visit: Payer: PPO | Admitting: Internal Medicine

## 2018-08-29 NOTE — Telephone Encounter (Signed)
Pt c/o intermittent chills that started last week. Pt does not have thermometer. Pt denies sinus pain or drainage, earache, chest congestion, sore throat. Pt denies any physical complaints except for a congested head. Advised pt to call back for fever or any new symptoms.   Reason for Disposition . Nursing judgment  Protocols used: NO GUIDELINE OR REFERENCE AVAILABLE-A-AH

## 2018-09-06 ENCOUNTER — Other Ambulatory Visit: Payer: Self-pay

## 2018-09-06 MED ORDER — PRAVASTATIN SODIUM 80 MG PO TABS: 80.0000 mg | ORAL_TABLET | Freq: Every day | ORAL | 1 refills | Status: DC

## 2018-09-06 MED ORDER — LOSARTAN POTASSIUM 25 MG PO TABS: 25.0000 mg | ORAL_TABLET | Freq: Every day | ORAL | 1 refills | Status: DC

## 2018-09-08 ENCOUNTER — Other Ambulatory Visit: Payer: Self-pay | Admitting: Internal Medicine

## 2018-09-08 DIAGNOSIS — B009 Herpesviral infection, unspecified: Secondary | ICD-10-CM

## 2018-09-08 MED ORDER — ACYCLOVIR 400 MG PO TABS: 400.0000 mg | ORAL_TABLET | Freq: Every day | ORAL | 0 refills | Status: DC

## 2018-09-08 NOTE — Telephone Encounter (Signed)
Office visit for further refills

## 2018-09-08 NOTE — Telephone Encounter (Signed)
Copied from [place] [id number]. Topic: Quick Communication - Rx Refill/Question >> Sep 08, 2018 11:53 AM Audrey Duran wrote: Medication: acyclovir (ZOVIRAX) 400 MG tablet  - pt has 2-3 weeks left and uses mail order - needs 90 day supply - she takes 1/day  Has the patient contacted their pharmacy? Yes - advised pt they did not receive reply from provider Preferred Pharmacy (with phone number or street name): [facility]) - [place], [place] [phone] (Phone) [phone] (Fax)

## 2018-09-12 ENCOUNTER — Other Ambulatory Visit: Payer: Self-pay | Admitting: Internal Medicine

## 2018-10-05 ENCOUNTER — Other Ambulatory Visit: Payer: Self-pay | Admitting: Internal Medicine

## 2018-10-05 ENCOUNTER — Telehealth: Payer: Self-pay | Admitting: Radiology

## 2018-10-05 DIAGNOSIS — N183 Chronic kidney disease, stage 3 unspecified: Secondary | ICD-10-CM

## 2018-10-05 DIAGNOSIS — R5383 Other fatigue: Secondary | ICD-10-CM

## 2018-10-05 DIAGNOSIS — E785 Hyperlipidemia, unspecified: Secondary | ICD-10-CM

## 2018-10-05 DIAGNOSIS — R7303 Prediabetes: Secondary | ICD-10-CM

## 2018-10-05 NOTE — Telephone Encounter (Signed)
Pt is coming in for labs tomorrow, please place future orders. Thank you.

## 2018-10-06 ENCOUNTER — Other Ambulatory Visit: Payer: Self-pay

## 2018-10-06 ENCOUNTER — Other Ambulatory Visit (INDEPENDENT_AMBULATORY_CARE_PROVIDER_SITE_OTHER): Payer: PPO

## 2018-10-06 DIAGNOSIS — R5383 Other fatigue: Secondary | ICD-10-CM

## 2018-10-06 DIAGNOSIS — R7303 Prediabetes: Secondary | ICD-10-CM

## 2018-10-06 DIAGNOSIS — E785 Hyperlipidemia, unspecified: Secondary | ICD-10-CM | POA: Diagnosis not present

## 2018-10-06 DIAGNOSIS — N183 Chronic kidney disease, stage 3 unspecified: Secondary | ICD-10-CM

## 2018-10-06 LAB — LIPID PANEL
CHOLESTEROL: 159 mg/dL (ref 0–200)
HDL: 51.1 mg/dL (ref >39.00)
LDL CALC: 91 mg/dL (ref 0–99)
NONHDL: 107.65
Total CHOL/HDL Ratio: 3
Triglycerides: 82 mg/dL (ref 0.0–149.0)
VLDL: 16.4 mg/dL (ref 0.0–40.0)

## 2018-10-06 LAB — COMPREHENSIVE METABOLIC PANEL
ALT: 16 U/L (ref 0–35)
AST: 18 U/L (ref 0–37)
Albumin: 4 g/dL (ref 3.5–5.2)
Alkaline Phosphatase: 55 U/L (ref 39–117)
BUN: 23 mg/dL (ref 6–23)
CHLORIDE: 105 meq/L (ref 96–112)
CO2: 27 meq/L (ref 19–32)
CREATININE: 1.04 mg/dL (ref 0.40–1.20)
Calcium: 10.1 mg/dL (ref 8.4–10.5)
GFR: 50.82 mL/min — ABNORMAL LOW (ref >60.00)
GLUCOSE: 105 mg/dL — AB (ref 70–99)
POTASSIUM: 4.6 meq/L (ref 3.5–5.1)
SODIUM: 139 meq/L (ref 135–145)
Total Bilirubin: 0.5 mg/dL (ref 0.2–1.2)
Total Protein: 6.5 g/dL (ref 6.0–8.3)

## 2018-10-06 LAB — HEMOGLOBIN A1C: Hgb A1c MFr Bld: 5.8 % (ref 4.6–6.5)

## 2018-10-06 LAB — TSH: TSH: 3.32 u[IU]/mL (ref 0.35–4.50)

## 2018-10-11 ENCOUNTER — Ambulatory Visit (INDEPENDENT_AMBULATORY_CARE_PROVIDER_SITE_OTHER): Payer: PPO | Admitting: Internal Medicine

## 2018-10-11 ENCOUNTER — Other Ambulatory Visit: Payer: Self-pay

## 2018-10-11 ENCOUNTER — Encounter: Payer: Self-pay | Admitting: Internal Medicine

## 2018-10-11 VITALS — BP 150/76 | HR 56 | Temp 98.1°F | Resp 15 | Ht 63.0 in | Wt 131.4 lb

## 2018-10-11 DIAGNOSIS — K921 Melena: Secondary | ICD-10-CM

## 2018-10-11 DIAGNOSIS — R42 Dizziness and giddiness: Secondary | ICD-10-CM

## 2018-10-11 DIAGNOSIS — R441 Visual hallucinations: Secondary | ICD-10-CM

## 2018-10-11 DIAGNOSIS — R410 Disorientation, unspecified: Secondary | ICD-10-CM | POA: Diagnosis not present

## 2018-10-11 DIAGNOSIS — R110 Nausea: Secondary | ICD-10-CM | POA: Diagnosis not present

## 2018-10-11 DIAGNOSIS — R1030 Lower abdominal pain, unspecified: Secondary | ICD-10-CM | POA: Diagnosis not present

## 2018-10-11 LAB — URINALYSIS, MICROSCOPIC ONLY: RBC / HPF: NONE SEEN (ref 0–?)

## 2018-10-11 LAB — POCT URINALYSIS DIPSTICK
BILIRUBIN UA: NEGATIVE
GLUCOSE UA: NEGATIVE
KETONES UA: NEGATIVE
Nitrite, UA: NEGATIVE
PH UA: 6 (ref 5.0–8.0)
Protein, UA: NEGATIVE
RBC UA: NEGATIVE
SPEC GRAV UA: 1.01 (ref 1.010–1.025)
UROBILINOGEN UA: 0.2 U/dL

## 2018-10-11 LAB — POC HEMOCCULT BLD/STL (OFFICE/1-CARD/DIAGNOSTIC)
Card #1 Date: NEGATIVE
Fecal Occult Blood, POC: NEGATIVE
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
## 2018-10-11 NOTE — Patient Instructions (Signed)
It sounds like you have had a viral infection that is getting better  I am checking a urine test to make sure you don't have an infection

## 2018-10-12 ENCOUNTER — Ambulatory Visit: Payer: PPO | Admitting: Internal Medicine

## 2018-10-12 ENCOUNTER — Other Ambulatory Visit (INDEPENDENT_AMBULATORY_CARE_PROVIDER_SITE_OTHER): Payer: PPO

## 2018-10-12 DIAGNOSIS — R110 Nausea: Secondary | ICD-10-CM

## 2018-10-12 DIAGNOSIS — R441 Visual hallucinations: Secondary | ICD-10-CM | POA: Insufficient documentation

## 2018-10-12 DIAGNOSIS — R42 Dizziness and giddiness: Secondary | ICD-10-CM

## 2018-10-12 LAB — CBC WITH DIFFERENTIAL/PLATELET
Basophils Absolute: 0.1 10*3/uL (ref 0.0–0.1)
Basophils Relative: 0.8 % (ref 0.0–3.0)
EOS PCT: 2.2 % (ref 0.0–5.0)
Eosinophils Absolute: 0.2 10*3/uL (ref 0.0–0.7)
HEMATOCRIT: 40.3 % (ref 36.0–46.0)
HEMOGLOBIN: 13.4 g/dL (ref 12.0–15.0)
Lymphocytes Relative: 23.1 % (ref 12.0–46.0)
Lymphs Abs: 2.1 10*3/uL (ref 0.7–4.0)
MCHC: 33.3 g/dL (ref 30.0–36.0)
MCV: 92.9 fl (ref 78.0–100.0)
MONOS PCT: 8 % (ref 3.0–12.0)
Monocytes Absolute: 0.7 10*3/uL (ref 0.1–1.0)
NEUTROS ABS: 6 10*3/uL (ref 1.4–7.7)
Neutrophils Relative %: 65.9 % (ref 43.0–77.0)
Platelets: 185 10*3/uL (ref 150.0–400.0)
RBC: 4.34 Mil/uL (ref 3.87–5.11)
RDW: 13 % (ref 11.5–15.5)
WBC: 9.1 10*3/uL (ref 4.0–10.5)

## 2018-10-12 LAB — URINE CULTURE
MICRO NUMBER:: 328448
SPECIMEN QUALITY: ADEQUATE

## 2018-10-12 LAB — B12 AND FOLATE PANEL
Folate: 12.1 ng/mL (ref >5.9)
Vitamin B-12: >1525 pg/mL — ABNORMAL HIGH (ref 211–911)

## 2018-10-12 NOTE — Assessment & Plan Note (Signed)
Combined with cognitive deficits and delusions. ,  Suspect frontotemporal dementia ,  Checking B12, thyroid,  Ruling out UTI,  And ordering MRI

## 2018-10-14 ENCOUNTER — Ambulatory Visit (INDEPENDENT_AMBULATORY_CARE_PROVIDER_SITE_OTHER): Payer: PPO | Admitting: Internal Medicine

## 2018-10-14 ENCOUNTER — Ambulatory Visit
Admission: RE | Admit: 2018-10-14 | Discharge: 2018-10-14 | Disposition: A | Discharge status: Home / Self Care | Payer: PPO | Source: Ambulatory Visit | Attending: Internal Medicine | Admitting: Internal Medicine

## 2018-10-14 ENCOUNTER — Encounter: Payer: Self-pay | Admitting: Internal Medicine

## 2018-10-14 ENCOUNTER — Other Ambulatory Visit: Payer: Self-pay

## 2018-10-14 DIAGNOSIS — R921 Mammographic calcification found on diagnostic imaging of breast: Secondary | ICD-10-CM | POA: Diagnosis not present

## 2018-10-14 DIAGNOSIS — R928 Other abnormal and inconclusive findings on diagnostic imaging of breast: Secondary | ICD-10-CM | POA: Diagnosis not present

## 2018-10-14 DIAGNOSIS — R441 Visual hallucinations: Secondary | ICD-10-CM

## 2018-10-14 MED ORDER — QUETIAPINE FUMARATE 25 MG PO TABS: ORAL_TABLET | ORAL | 1 refills | Status: DC
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
## 2018-10-14 NOTE — Patient Instructions (Signed)
  I am prescribing a medication to take after dinner to help your nighttime anxiety  You can start with 1/2 tablet daily after dinner for the first week

## 2018-10-16 ENCOUNTER — Encounter: Payer: Self-pay | Admitting: Internal Medicine

## 2018-10-16 NOTE — Assessment & Plan Note (Signed)
Trial of low dose seroquel in the evening, to be taken after dinner. MRI brain and neurology consult ordered.

## 2018-10-17 ENCOUNTER — Other Ambulatory Visit: Payer: Self-pay | Admitting: Internal Medicine

## 2018-10-17 DIAGNOSIS — R921 Mammographic calcification found on diagnostic imaging of breast: Secondary | ICD-10-CM

## 2018-10-18 ENCOUNTER — Telehealth: Payer: Self-pay | Admitting: Internal Medicine

## 2018-10-18 NOTE — Telephone Encounter (Signed)
Spoke to pt regarding her MRI being scheduled on 4/22. She states that she doesn't feel like she needs this at this time. I told her that I would cancel this for now since she doesn't want to do it and to please let us know if she changes her mind so that we can get this scheduled for her.

## 2018-10-24 ENCOUNTER — Telehealth: Payer: Self-pay | Admitting: Internal Medicine

## 2018-10-24 NOTE — Telephone Encounter (Signed)
Copied from [place] [phone]. Topic: Quick Communication - See Telephone Encounter >> Oct 24, 2018  1:30 PM Blase Mess A wrote: CRM for notification. See Telephone encounter for: 10/24/18.  Patient daughter, Willia Craze [phone] (Cell)  Concerned about her mother unsure if having hallucinations or  sundowners. In the evenings, see "sees" things. Wanting to know if Dr. Derrel Nip would recommend a referral for a psychiatrist. Ms. Saddie Benders is requesting a list a her mom's medication. Please advise on next steps.

## 2018-10-25 NOTE — Telephone Encounter (Signed)
Do not release information to daughter unless mother has given permission:  Patient has refused neurology referral and unless she has agreed to psychiatry referral, I will not waste Melissa's time in making the referral .  You can ask patient
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
## 2018-11-16 ENCOUNTER — Ambulatory Visit: Payer: PPO

## 2018-12-06 ENCOUNTER — Ambulatory Visit: Payer: Self-pay | Admitting: *Deleted
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
## 2018-12-06 NOTE — Telephone Encounter (Signed)
Patient says she has had chill s all morning has had Headache took tylenol , just feels cold. Does not hurt has no other problem, the chills stop when she takes tylenol. Says he feels fine now advised if she has any other symptoms she needs to be seen either virtual or phone visit to call office. Patient refused visit today.

## 2018-12-07 NOTE — Telephone Encounter (Signed)
She needs to buy a thermometer if she doesn't own one and start checking her temperature.  If she has evidence of fever she needs to contact us

## 2018-12-08 NOTE — Telephone Encounter (Signed)
Pt.notified

## 2019-01-02 DIAGNOSIS — H353131 Nonexudative age-related macular degeneration, bilateral, early dry stage: Secondary | ICD-10-CM | POA: Diagnosis not present

## 2019-01-03 ENCOUNTER — Telehealth: Payer: Self-pay
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
## 2019-01-05 ENCOUNTER — Telehealth: Payer: Self-pay | Admitting: Internal Medicine
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
## 2019-01-06 NOTE — Telephone Encounter (Signed)
Audrey Duran is on DPR.

## 2019-01-06 NOTE — Telephone Encounter (Signed)
Spoke with pt's daughter and informed her of Dr. Lupita Dawn message. The daughter stated that she is getting off work today and is going to stay with her mother this weekend. Daughter stated that she will find out if the pt is taking the seroquel or not and let us know.

## 2019-01-06 NOTE — Telephone Encounter (Signed)
I am aware of this problem. She has had 2 visits with me about this issue but  Patient refuses to consider that she is having hallucinations and refuses to have a brain MRI or see a neurologist ,  so there is very little I can do to persuade her unless the family can prove to her that there is nobody doing the things she says they ae doing . I prescribed medication at her last visit in March called Seroquel (quetiapine)  But I cannot know if she is taking it  Please encourage daughter to have a virtual visit with [place], or a face to face visit.

## 2019-01-06 NOTE — Telephone Encounter (Signed)
Spoke with pt and she stated that she just wants to have some kind of test done that will let her and her children know that she is okay and can go back to work if she wants too. The pt stated that she is "tried" of her children thinking that there is something wrong with her when it is just that they will not believe her when she she tells them that her neighbor does mean things to her. I explained to the pt that Dr. Derrel Nip had ordered an MRI of the Brain for her to have done so that Dr. Derrel Nip could see if there was any changes in the brain that may suggest anything. The pt stated that if that is what it takes for her children to leave her alone then she will do it as long as her insurance will cover it. The pt also stated that she does not want [place] to have any contact with her children any longer, she stated that they do not live around here and they will not leave her alone. I told pt that the next time she is in the office she will need to fill out a new DPR form and do not put any of her children on the form if that is what she wants.

## 2019-01-07 NOTE — Telephone Encounter (Signed)
Melissa do the orders for the MRI of the brain and the neurology referral that I placed in March  need to be repeated for Audrey Duran?  She is now willing to go.

## 2019-01-07 NOTE — Telephone Encounter (Signed)
The "test " will include an evaluation by a neurologist AFTER the MRI has been done.  These were both ordered in March.  I am checking with Audrey Duran to see if they have to be reordered

## 2019-01-25 ENCOUNTER — Other Ambulatory Visit: Payer: Self-pay

## 2019-01-25 DIAGNOSIS — B009 Herpesviral infection, unspecified: Secondary | ICD-10-CM

## 2019-01-25 MED ORDER — ALENDRONATE SODIUM 70 MG PO TABS: 70.0000 mg | ORAL_TABLET | ORAL | 3 refills | Status: DC

## 2019-01-25 MED ORDER — ACYCLOVIR 400 MG PO TABS: 400.0000 mg | ORAL_TABLET | Freq: Every day | ORAL | 0 refills | Status: DC

## 2019-01-25 MED ORDER — FUROSEMIDE 20 MG PO TABS: ORAL_TABLET | ORAL | 1 refills | Status: DC

## 2019-02-01 ENCOUNTER — Ambulatory Visit: Payer: PPO

## 2019-02-07 DIAGNOSIS — R079 Chest pain, unspecified: Secondary | ICD-10-CM | POA: Diagnosis not present

## 2019-02-07 DIAGNOSIS — Z9889 Other specified postprocedural states: Secondary | ICD-10-CM | POA: Diagnosis not present

## 2019-02-07 DIAGNOSIS — I255 Ischemic cardiomyopathy: Secondary | ICD-10-CM | POA: Diagnosis not present

## 2019-02-07 DIAGNOSIS — I214 Non-ST elevation (NSTEMI) myocardial infarction: Secondary | ICD-10-CM | POA: Diagnosis not present

## 2019-02-07 DIAGNOSIS — I10 Essential (primary) hypertension: Secondary | ICD-10-CM | POA: Diagnosis not present

## 2019-02-07 DIAGNOSIS — J449 Chronic obstructive pulmonary disease, unspecified: Secondary | ICD-10-CM | POA: Diagnosis not present

## 2019-02-22 ENCOUNTER — Other Ambulatory Visit: Payer: Self-pay

## 2019-03-14 ENCOUNTER — Telehealth: Payer: Self-pay | Admitting: *Deleted

## 2019-03-14 NOTE — Telephone Encounter (Signed)
Copied from [place] [id number]. Topic: General - Inquiry >> Mar 14, 2019  4:09 PM Audrey Duran, NT wrote: Reason for CRM: Patient called in stating she is not wanting to office to allow her children, Audrey Duran ([phone]) and Audrey Duran [phone]), to make any appointments for her or receive information. Stated the only person she wants to assist her with her medical information is her sister, Audrey Duran [phone]). Patient stated she does not want children informed of this change. Informed patient form is needing to be filled out and she stated she would like office to call if truly necessary. Please advise. Call back is [phone].

## 2019-03-15 ENCOUNTER — Other Ambulatory Visit: Payer: Self-pay | Admitting: Internal Medicine

## 2019-03-15 NOTE — Telephone Encounter (Signed)
Patient states she's completely out of ALPRAZolam (XANAX) 0.5 MG tablet informed please allow 48 to 72 hour turn around time.  Patient states she will never take QUEtiapine (SEROQUEL) 25 MG tablet again it makes her tired, drained and out of it  [place], [place] - [place] [phone] (Phone) [phone] (Fax)

## 2019-03-15 NOTE — Telephone Encounter (Signed)
Refilled: 06/28/2018 Last OV: 10/14/2018 Next OV: 04/13/2019

## 2019-03-22 NOTE — Telephone Encounter (Signed)
Pt called returning your phone call  

## 2019-03-22 NOTE — Telephone Encounter (Signed)
Attempted to call pt. No answer no voicemail.  

## 2019-03-30 ENCOUNTER — Other Ambulatory Visit: Payer: Self-pay

## 2019-03-30 MED ORDER — PRAVASTATIN SODIUM 80 MG PO TABS: 80.0000 mg | ORAL_TABLET | Freq: Every day | ORAL | 1 refills | Status: DC

## 2019-03-30 MED ORDER — ISOSORBIDE MONONITRATE ER 60 MG PO TB24: 60.0000 mg | ORAL_TABLET | Freq: Every day | ORAL | 1 refills | Status: DC

## 2019-03-30 NOTE — Telephone Encounter (Signed)
Spoke with pt to let her know that she would need to fill out a new DPR form with the people that she gives [place] permission to speak to. Told the pt that I would mail her the form so she could fill it out and then she could either mail it back to [place] or bring it to the office when completed. Pt gave a verbal understanding.

## 2019-03-31 ENCOUNTER — Other Ambulatory Visit: Payer: Self-pay

## 2019-03-31 DIAGNOSIS — B009 Herpesviral infection, unspecified: Secondary | ICD-10-CM

## 2019-03-31 MED ORDER — ACYCLOVIR 400 MG PO TABS: 400.0000 mg | ORAL_TABLET | Freq: Every day | ORAL | 0 refills | Status: DC

## 2019-04-04 ENCOUNTER — Ambulatory Visit
Admission: RE | Admit: 2019-04-04 | Discharge: 2019-04-04 | Disposition: A | Discharge status: Home / Self Care | Payer: PPO | Source: Ambulatory Visit | Attending: Internal Medicine | Admitting: Internal Medicine

## 2019-04-04 DIAGNOSIS — R921 Mammographic calcification found on diagnostic imaging of breast: Secondary | ICD-10-CM | POA: Insufficient documentation

## 2019-04-04 DIAGNOSIS — R922 Inconclusive mammogram: Secondary | ICD-10-CM | POA: Diagnosis not present

## 2019-04-06 ENCOUNTER — Telehealth: Payer: Self-pay

## 2019-04-06 NOTE — Telephone Encounter (Signed)
Copied from [place] [id number]. Topic: General - Other >> Apr 06, 2019 12:41 PM Rayann Heman wrote: Reason for CRM: pt called and stated that she would like a call from [place] regarding DPR. Please advise

## 2019-04-11 ENCOUNTER — Other Ambulatory Visit: Payer: Self-pay

## 2019-04-12 NOTE — Telephone Encounter (Signed)
Duplicate message. Already spoke with pt and she has decided not to change the DPR. I explained to the pt that it was completely up to her and if she chagned her mind to just fill out the form and mail it back to [place]. Pt had given a verbal understanding.

## 2019-04-13 ENCOUNTER — Ambulatory Visit (INDEPENDENT_AMBULATORY_CARE_PROVIDER_SITE_OTHER): Payer: PPO | Admitting: Internal Medicine

## 2019-04-13 ENCOUNTER — Other Ambulatory Visit: Payer: Self-pay

## 2019-04-13 ENCOUNTER — Encounter: Payer: Self-pay | Admitting: Internal Medicine

## 2019-04-13 VITALS — BP 120/58 | HR 51 | Temp 96.1°F | Resp 15 | Ht 63.0 in | Wt 127.0 lb

## 2019-04-13 DIAGNOSIS — N183 Chronic kidney disease, stage 3 unspecified: Secondary | ICD-10-CM

## 2019-04-13 DIAGNOSIS — Z23 Encounter for immunization: Secondary | ICD-10-CM | POA: Diagnosis not present

## 2019-04-13 DIAGNOSIS — R42 Dizziness and giddiness: Secondary | ICD-10-CM | POA: Diagnosis not present

## 2019-04-13 DIAGNOSIS — R7303 Prediabetes: Secondary | ICD-10-CM

## 2019-04-13 DIAGNOSIS — R634 Abnormal weight loss: Secondary | ICD-10-CM

## 2019-04-13 DIAGNOSIS — I255 Ischemic cardiomyopathy: Secondary | ICD-10-CM | POA: Diagnosis not present

## 2019-04-13 DIAGNOSIS — R441 Visual hallucinations: Secondary | ICD-10-CM | POA: Diagnosis not present

## 2019-04-13 DIAGNOSIS — E559 Vitamin D deficiency, unspecified: Secondary | ICD-10-CM

## 2019-04-13 DIAGNOSIS — J439 Emphysema, unspecified: Secondary | ICD-10-CM | POA: Diagnosis not present

## 2019-04-13 LAB — COMPREHENSIVE METABOLIC PANEL
ALT: 12 U/L (ref 0–35)
AST: 15 U/L (ref 0–37)
Albumin: 4 g/dL (ref 3.5–5.2)
Alkaline Phosphatase: 48 U/L (ref 39–117)
BUN: 18 mg/dL (ref 6–23)
CO2: 30 mEq/L (ref 19–32)
Calcium: 10.4 mg/dL (ref 8.4–10.5)
Chloride: 105 mEq/L (ref 96–112)
Creatinine, Ser: 0.97 mg/dL (ref 0.40–1.20)
GFR: 55.01 mL/min — ABNORMAL LOW (ref >60.00)
Glucose, Bld: 95 mg/dL (ref 70–99)
Potassium: 4.7 mEq/L (ref 3.5–5.1)
Sodium: 140 mEq/L (ref 135–145)
Total Bilirubin: 0.6 mg/dL (ref 0.2–1.2)
Total Protein: 6.5 g/dL (ref 6.0–8.3)

## 2019-04-13 LAB — VITAMIN D 25 HYDROXY (VIT D DEFICIENCY, FRACTURES): VITD: 43.71 ng/mL (ref 30.00–100.00)

## 2019-04-13 LAB — TSH: TSH: 1.53 u[IU]/mL (ref 0.35–4.50)

## 2019-04-13 LAB — HEMOGLOBIN A1C: Hgb A1c MFr Bld: 5.9 % (ref 4.6–6.5)

## 2019-04-13 LAB — VITAMIN B12: Vitamin B-12: 1471 pg/mL — ABNORMAL HIGH (ref 211–911)

## 2019-04-13 NOTE — Progress Notes (Signed)
Subjective:  Patient ID: Audrey Duran, female    DOB: 09/14/1934  Age: 84 y.o. MRN: [id number]  CC: The primary encounter diagnosis was Prediabetes. Diagnoses of Need for immunization against influenza, Vitamin D deficiency, Episodes of formed visual hallucinations, Dizziness, Pulmonary emphysema, unspecified emphysema type ([place]), Unintentional weight loss of more than 10 pounds, CKD (chronic kidney disease) stage 3, GFR 30-59 ml/min (HCC), and Ischemic cardiomyopathy were also pertinent to this visit.  HPI ORIE BRENNING presents for 6 MONTH FOLLOW up on multiple issues    She feels generally well.  She is [facility]ING TO THE Y 3 DAYS PER WEEK, participating in outside classes until last week, moved indoors last week and  Exercising without mask , but the gym limits classes to 15 people for silver sneakers classes  Feels more energetic than usual.  Averaging 6 hours per night. Feels rested.  Feels more productive than in her earlier years since she started going to the Y regularly  The patient has no signs or symptoms of COVID 19 infection (fever, cough, sore throat  or shortness of breath beyond what is typical for patient).  Patient denies contact with other persons with the above mentioned symptoms or with anyone confirmed to have COVID 19   Weight loss of 21 lbs since 2017,  10 lbs in the last year .  Cooks for herself  Mostly on the weekends and eats leftovers most of the week 3 meals  Daily,  .  Wt loss is unintentional,  "I just stay busy", states she spends most of the day trying to sell 2 acres of land and a building she has been emptying out.    CAD:: patient denies chest pain, shortness of breath and lower extremity edema. Patient is following a reduce salt diet most days and is taking medications as prescribed.  She continues to report sightings of  "cult activity":   people in her neighborhood who are making movies and using several of the homes in her neighborhood to film and  view the movies.  She endorses other  visual phenomena that sound a bit fantastic and have not been witnessed by her family  Who is concered that he is having visual hallucinations .  [name] and [name] (her daughter and son ) are upset with her because she will not see a neurologist.  She states that she is being threatened with loss of her home by one of them  Who is in love with her neighbor Peter Congo who wants to take her home.  She stats that there are 3 people( men and women)  stationed outside her home from 8 am to 8 pm  But do not speak to her and run away if she approaches them. They do not try to get into her house,  She keeps her doors locked and her shaes drawn.  She has called the police before and they have found nobody ,  The patient then sees them on the roof of her house.   She states that there are neighbors who are also witnesses to these strangers,  But will not speak of them.   She states that her daughter in law believes her,  Has 2 others that believe her without seeing the strangers  For themselves.   Patient has refused to have a brain MRI and a neurologist referral .  She denies headaches,  Vision changes.  No driving accidents,  An falls and is handling her own finances.  Outpatient Medications Prior to Visit  Medication Sig Dispense Refill  . acyclovir (ZOVIRAX) 400 MG tablet Take 1 tablet (400 mg total) by mouth daily. 90 tablet 0  . alendronate (FOSAMAX) 70 MG tablet Take 1 tablet (70 mg total) by mouth every 7 (seven) days. Take with a full glass of water on an empty stomach. 12 tablet 3  . ALPRAZolam (XANAX) 0.5 MG tablet TAKE 1 TABLET BY MOUTH AT BEDTIME AS NEEDED FOR SLEEP 30 tablet 2  . aspirin 81 MG tablet Take 160 mg by mouth daily.      . carbonyl iron (FEOSOL) 45 MG TABS Take one by mouth daily    . Cholecalciferol (VITAMIN D3) 3000 UNITS TABS Take 1 tablet by mouth daily. Take one by mouth daily 90 tablet 3  . cyanocobalamin 1000 MCG tablet Take one by mouth daily      . furosemide (LASIX) 20 MG tablet Take 1/2 tablet by mouth daily 45 tablet 1  . isosorbide mononitrate (IMDUR) 60 MG 24 hr tablet Take 1 tablet (60 mg total) by mouth daily. 90 tablet 1  . losartan (COZAAR) 25 MG tablet Take 1 tablet (25 mg total) by mouth daily. 90 tablet 1  . magnesium oxide (MAG-OX) 400 MG tablet Take 400 mg by mouth daily.      . Omega-3 Fatty Acids (FISH OIL) 1200 MG CAPS Take one by mouth daily     . pravastatin (PRAVACHOL) 80 MG tablet Take 1 tablet (80 mg total) by mouth daily. 90 tablet 1  . timolol (TIMOPTIC) 0.5 % ophthalmic solution     . conjugated estrogens (PREMARIN) vaginal cream Place 1 Applicatorful vaginally at bedtime. For 2 weeks,  Then reduce use to twice weekly (Patient not taking: Reported on 04/13/2019) 42.5 g 12  . meclizine (ANTIVERT) 25 MG tablet Take 2 tablets (50 mg total) by mouth 3 (three) times daily as needed for dizziness or nausea. (Patient not taking: Reported on 04/13/2019) 30 tablet 1  . QUEtiapine (SEROQUEL) 25 MG tablet One tablet  daily after dinner (Patient not taking: Reported on 04/13/2019) 30 tablet 1  . TIMOLOL MALEATE PF OP Use one drop both eyes 2 times a day      No facility-administered medications prior to visit.     Review of Systems;  Patient denies headache, fevers, malaise, unintentional weight loss, skin rash, eye pain, sinus congestion and sinus pain, sore throat, dysphagia,  hemoptysis , cough, dyspnea, wheezing, chest pain, palpitations, orthopnea, edema, abdominal pain, nausea, melena, diarrhea, constipation, flank pain, dysuria, hematuria, urinary  Frequency, nocturia, numbness, tingling, seizures,  Focal weakness, Loss of consciousness,  Tremor, insomnia, depression, anxiety, and suicidal ideation.      Objective:  BP (!) 120/58 (BP Location: Left Arm, Patient Position: Sitting, Cuff Size: Normal)   Pulse (!) 51   Temp (!) 96.1 F (35.6 C) (Temporal)   Resp 15   Ht 5\' 3"  (1.6 m)   Wt 127 lb (57.6 kg)   SpO2 96%    BMI 22.50 kg/m   BP Readings from Last 3 Encounters:  04/13/19 (!) 120/58  10/14/18 112/62  10/11/18 (!) 150/76    Wt Readings from Last 3 Encounters:  04/13/19 127 lb (57.6 kg)  10/14/18 131 lb 9.6 oz (59.7 kg)  10/11/18 131 lb 6.4 oz (59.6 kg)    General appearance: alert, cooperative and appears stated age Ears: normal TM's and external ear canals both ears Throat: lips, mucosa, and tongue normal; teeth and gums normal  Neck: no adenopathy, no carotid bruit, supple, symmetrical, trachea midline and thyroid not enlarged, symmetric, no tenderness/mass/nodules Back: symmetric, no curvature. ROM normal. No CVA tenderness. Lungs: clear to auscultation bilaterally Heart: regular rate and rhythm, S1, S2 normal, no murmur, click, rub or gallop Abdomen: soft, non-tender; bowel sounds normal; no masses,  no organomegaly Pulses: 2+ and symmetric Skin: Skin color, texture, turgor normal. No rashes or lesions Lymph nodes: Cervical, supraclavicular, and axillary nodes normal.  Lab Results  Component Value Date   HGBA1C 5.9 04/13/2019   HGBA1C 5.8 10/06/2018   HGBA1C 6.0 02/24/2018    Lab Results  Component Value Date   CREATININE 0.97 04/13/2019   CREATININE 1.04 10/06/2018   CREATININE 0.96 02/24/2018    Lab Results  Component Value Date   WBC 9.1 10/12/2018   HGB 13.4 10/12/2018   HCT 40.3 10/12/2018   PLT 185.0 10/12/2018   GLUCOSE 95 04/13/2019   CHOL 159 10/06/2018   TRIG 82.0 10/06/2018   HDL 51.10 10/06/2018   LDLCALC 91 10/06/2018   ALT 12 04/13/2019   AST 15 04/13/2019   NA 140 04/13/2019   K 4.7 04/13/2019   CL 105 04/13/2019   CREATININE 0.97 04/13/2019   BUN 18 04/13/2019   CO2 30 04/13/2019   TSH 1.53 04/13/2019   INR 1.0 02/16/2012   HGBA1C 5.9 04/13/2019    Mm Diag Breast Tomo Bilateral  Result Date: 04/04/2019 CLINICAL DATA:  Short-term follow-up for probably benign right breast calcifications. EXAM: DIGITAL DIAGNOSTIC BILATERAL MAMMOGRAM WITH  CAD AND TOMO COMPARISON:  Previous exam(s). ACR Breast Density Category c: The breast tissue is heterogeneously dense, which may obscure small masses. FINDINGS: No suspicious masses or calcifications are seen in either breast. Spot compression magnification views were performed over the lower slightly inner right breast. 0.5 cm group of coarse likely early dystrophic calcifications appear unchanged. Mammographic images were processed with CAD. IMPRESSION: Stable probably benign right breast calcifications. There is no mammographic evidence of malignancy. RECOMMENDATION: Bilateral diagnostic mammography with magnification views of the right breast in 12 months which will demonstrate 2 years of stability of the probably benign right breast calcifications. I have discussed the findings and recommendations with the patient. If applicable, a reminder letter will be sent to the patient regarding the next appointment. BI-RADS CATEGORY  3: Probably benign. Electronically Signed   By: Everlean Alstrom M.D.   On: 04/04/2019 10:58    Assessment & Plan:   Problem List Items Addressed This Visit      Unprioritized   Vitamin D deficiency   Relevant Orders   VITAMIN D 25 Hydroxy (Vit-D Deficiency, Fractures) (Completed)   COPD (chronic obstructive pulmonary disease) ([place])    Exam today is normal , flu vaccine advised and given.        Ischemic cardiomyopathy    Asymptomatic.  partipicates in regular exercise.  Has semi annual visits with [name],   Most recently January 21019  2D echocardiogram 03/03/2016 revealed normal left ventricular function, with LVEF of 50%. ETT sestamibi study 03/03/2016 revealed LV ejection fraction 59%, without evidence for scar or ischemia.  Continue Imdur,  Beta blocker and losartan           CKD (chronic kidney disease) stage 3, GFR 30-59 ml/min (HCC)    She is avoiding use of NSAIDs and her cr is stable .  Sees Nephrology twice annually.   Lab Results  Component Value Date    CREATININE 0.97 04/13/2019  Prediabetes - Primary    Her  random glucose is not  elevated but her A1c suggests she is at risk for developing diabetes.  I recommend he follow a low glycemic index diet and particpate regularly in an aerobic  exercise activity.  We should check an A1c in 6 months.    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  She refused the trial of low dose seroquel in the evening,. MRI brain and neurology consult .  She is only willing to have the MRI if it will prove that  She is right. Which it cannot do.       Relevant Orders   TSH (Completed)   RPR (Completed)   HIV antibody (with reflex) (Completed)   Unintentional weight loss of more than 10 pounds    I suspect due to FTD.  Checking for vitamin deficiencies.  I have reviewed her diet and recommended that she increase her protein and fat intake while monitoring her carbohydrates.   . Lab Results  Component Value Date   TSH 1.53 04/13/2019   Lab Results  Component Value Date   VITAMINB12 1,471 (H) 04/13/2019   No results found for: HIV1X2        Other Visit Diagnoses    Need for immunization against influenza       Relevant Orders   Flu Vaccine QUAD High Dose(Fluad) (Completed)   Dizziness       Relevant Orders   Vitamin B12 (Completed)     I provided  25 minutes of non-face-to-face time during this encounter reviewing patient's current problems and post surgeries.  Providing counseling on the above mentioned problems , and coordination  of care .  I have discontinued Winston L. Kartes's QUEtiapine. I am also having her maintain her aspirin, carbonyl iron, magnesium oxide, Fish Oil, cyanocobalamin, Vitamin D3, meclizine, conjugated estrogens, losartan, alendronate, furosemide, ALPRAZolam,  isosorbide mononitrate, pravastatin, acyclovir, and timolol.  No orders of the defined types were placed in this encounter.   Medications Discontinued During This Encounter  Medication Reason  . TIMOLOL MALEATE PF OP Duplicate  . QUEtiapine (SEROQUEL) 25 MG tablet     Follow-up: No follow-ups on file.   Crecencio Mc, MD

## 2019-04-13 NOTE — Patient Instructions (Signed)
I will talk to Audrey Duran about how she can confirm what you are seeing

## 2019-04-14 LAB — HIV ANTIBODY (ROUTINE TESTING W REFLEX): HIV 1&2 Ab, 4th Generation: NONREACTIVE

## 2019-04-14 LAB — RPR: RPR Ser Ql: NONREACTIVE

## 2019-04-16 DIAGNOSIS — R634 Abnormal weight loss: Secondary | ICD-10-CM | POA: Insufficient documentation

## 2019-04-16 NOTE — Assessment & Plan Note (Signed)
Her  random glucose is not  elevated but her A1c suggests she is at risk for developing diabetes.  I recommend he follow a low glycemic index diet and particpate regularly in an aerobic  exercise activity.  We should check an A1c in 6 months.    Lab Results  Component Value Date   HGBA1C 5.9 04/13/2019

## 2019-04-16 NOTE — Assessment & Plan Note (Addendum)
I suspect THAT SHE has FTD.  She refused the trial of low dose seroquel in the evening,. MRI brain and neurology consult .  She is only willing to have the MRI if it will prove that  She is right. Which it cannot do.

## 2019-04-16 NOTE — Assessment & Plan Note (Addendum)
Exam today is normal , flu vaccine advised and given.

## 2019-04-16 NOTE — Assessment & Plan Note (Signed)
Asymptomatic.  partipicates in regular exercise.  Has semi annual visits with [name],   Most recently January 21019  2D echocardiogram 03/03/2016 revealed normal left ventricular function, with LVEF of 50%. ETT sestamibi study 03/03/2016 revealed LV ejection fraction 59%, without evidence for scar or ischemia.  Continue Imdur,  Beta blocker and losartan

## 2019-04-16 NOTE — Assessment & Plan Note (Signed)
She is avoiding use of NSAIDs and her cr is stable .  Sees Nephrology twice annually.   Lab Results  Component Value Date   CREATININE 0.97 04/13/2019

## 2019-04-16 NOTE — Assessment & Plan Note (Addendum)
I suspect due to FTD.  Checking for vitamin deficiencies.  I have reviewed her diet and recommended that she increase her protein and fat intake while monitoring her carbohydrates.   . Lab Results  Component Value Date   TSH 1.53 04/13/2019   Lab Results  Component Value Date   [id number] (H) 04/13/2019   No results found for: HIV1X2

## 2019-04-17 ENCOUNTER — Other Ambulatory Visit: Payer: Self-pay

## 2019-04-17 MED ORDER — LOSARTAN POTASSIUM 25 MG PO TABS: 25.0000 mg | ORAL_TABLET | Freq: Every day | ORAL | 1 refills | Status: DC

## 2019-04-19 ENCOUNTER — Encounter: Payer: Self-pay | Admitting: *Deleted

## 2019-04-25 ENCOUNTER — Telehealth: Payer: Self-pay | Admitting: Internal Medicine

## 2019-04-25 NOTE — Telephone Encounter (Signed)
Pt called back and said disregard the previous message because she found the answer.

## 2019-04-25 NOTE — Telephone Encounter (Signed)
Pt states that she needs to speak with Janett Billow about one of the medications that was given to her last time.

## 2019-04-26 ENCOUNTER — Telehealth: Payer: Self-pay

## 2019-04-26 NOTE — Telephone Encounter (Signed)
Copied from [place] [phone]. Topic: General - Inquiry >> Apr 26, 2019  3:54 PM Percell Belt A wrote: Reason for CRM: pt called in and would like to talk to "Janett Billow" about a letter she rec'd in the mail.  She did not want to give me any other info Best number  [phone]

## 2019-04-28 NOTE — Telephone Encounter (Signed)
Patient called in about her HIV results and she is concerned about having hallucinations mentioned in her chart. She would like someone to give her a call back.

## 2019-05-01 NOTE — Telephone Encounter (Signed)
LMTCB

## 2019-05-02 NOTE — Telephone Encounter (Signed)
Patient returning your call.

## 2019-05-03 NOTE — Telephone Encounter (Signed)
Spoke with pt and she is concerned because she received a letter showing that she was tested for HIV and that there is documentation in the chart about her having hallucinations. I explained to pt that she does not have HIV and that it is just a screening test that is done on everyone. The pt does not like that it is done and feels like her reputation has been ruined because the test was performed. Pt stated that it will just have to be okay and hung up the phone.

## 2019-06-21 ENCOUNTER — Ambulatory Visit: Payer: Self-pay | Admitting: *Deleted

## 2019-06-21 NOTE — Telephone Encounter (Signed)
Pt has declined appt at this time.

## 2019-06-21 NOTE — Telephone Encounter (Signed)
Patient reports yesterday she was chilled- she worked through it.- cooking pies for Thanksgiving. At bedtime she did feel a little woozy. Patient does not have fever. Patient feels better today- should she get tested for COVID before her family comes tomorrow. Advised patient her results would not be back in time. Patient reports no exposure. Reviewed other things that can make her dizzy- dehydration, not eating enough, drop in pulse/BP. Patient has been advised to monitor BP/P, temperature (97.5 this morning), call back if she feels dizzy again.She declines to make 2 week appointment at this time.  Reason for Disposition . [1] MILD dizziness (e.g., walking normally) AND [2] has been evaluated by physician for this  Answer Assessment - Initial Assessment Questions 1. DESCRIPTION: "Describe your dizziness."     Last night - feels better today 2. LIGHTHEADED: "Do you feel lightheaded?" (e.g., somewhat faint, woozy, weak upon standing)     Patient is fine this morning- she has taken shower and eaten breakfast and feeling fine now 3. VERTIGO: "Do you feel like either you or the room is spinning or tilting?" (i.e. vertigo)     no 4. SEVERITY: "How bad is it?"  "Do you feel like you are going to faint?" "Can you stand and walk?"   - MILD - walking normally   - MODERATE - interferes with normal activities (e.g., work, school)    - SEVERE - unable to stand, requires support to walk, feels like passing out now.      No dizziness this morning-mild 5. ONSET:  "When did the dizziness begin?"     Last night patient leaned over and got woozy 6. AGGRAVATING FACTORS: "Does anything make it worse?" (e.g., standing, change in head position)     Dizziness when bending to pick over something 7. HEART RATE: "Can you tell me your heart rate?" "How many beats in 15 seconds?"  (Note: not all patients can do this)       Not at this 8. CAUSE: "What do you think is causing the dizziness?"     Not sure- busy yesterday  cooking 9. RECURRENT SYMPTOM: "Have you had dizziness before?" If so, ask: "When was the last time?" "What happened that time?"     no 10. OTHER SYMPTOMS: "Do you have any other symptoms?" (e.g., fever, chest pain, vomiting, diarrhea, bleeding)       No- chills  11. PREGNANCY: "Is there any chance you are pregnant?" "When was your last menstrual period?"       n/a  Protocols used: DIZZINESS [facility]

## 2019-06-26 ENCOUNTER — Other Ambulatory Visit: Payer: Self-pay | Admitting: Internal Medicine

## 2019-06-28 NOTE — Telephone Encounter (Signed)
Refilled: 03/15/2019 Last OV: 04/13/2019 Next OV: not scheduled

## 2019-07-03 DIAGNOSIS — H40003 Preglaucoma, unspecified, bilateral: Secondary | ICD-10-CM | POA: Diagnosis not present

## 2019-07-10 DIAGNOSIS — H40003 Preglaucoma, unspecified, bilateral: Secondary | ICD-10-CM | POA: Diagnosis not present

## 2019-08-07 ENCOUNTER — Telehealth: Payer: Self-pay | Admitting: Internal Medicine

## 2019-08-07 ENCOUNTER — Other Ambulatory Visit: Payer: Self-pay

## 2019-08-07 DIAGNOSIS — R6883 Chills (without fever): Secondary | ICD-10-CM

## 2019-08-07 MED ORDER — FUROSEMIDE 20 MG PO TABS: ORAL_TABLET | ORAL | 0 refills | Status: DC

## 2019-08-07 NOTE — Telephone Encounter (Signed)
Pt called about having chills for about over a month. Please advise and Thank you!  Call pt @ [phone].

## 2019-08-08 ENCOUNTER — Other Ambulatory Visit
Admission: RE | Admit: 2019-08-08 | Discharge: 2019-08-08 | Disposition: A | Discharge status: Home / Self Care | Payer: PPO | Source: Ambulatory Visit | Attending: Internal Medicine | Admitting: Internal Medicine

## 2019-08-08 ENCOUNTER — Other Ambulatory Visit: Payer: PPO

## 2019-08-08 DIAGNOSIS — R6883 Chills (without fever): Secondary | ICD-10-CM

## 2019-08-08 LAB — URINALYSIS, ROUTINE W REFLEX MICROSCOPIC
Bilirubin Urine: NEGATIVE
Glucose, UA: NEGATIVE mg/dL
Hgb urine dipstick: NEGATIVE
Ketones, ur: NEGATIVE mg/dL
Leukocytes,Ua: NEGATIVE
Nitrite: NEGATIVE
Protein, ur: NEGATIVE mg/dL
Specific Gravity, Urine: 1.006 (ref 1.005–1.030)
pH: 6 (ref 5.0–8.0)

## 2019-08-08 NOTE — Telephone Encounter (Signed)
Spoke with pt and scheduled her for a telephone visit with Dr. Derrel Nip on Friday the 15th. Also told pt that she would need to go over to [facility] lab at the [place] to give [place] a urine since she was having symptoms of chills. Pt gave a verbal understanding.  Labs have been ordered.

## 2019-08-08 NOTE — Addendum Note (Signed)
Addended by: Adair Laundry on: 08/08/2019 11:11 AM   Modules accepted: Orders

## 2019-08-09 ENCOUNTER — Telehealth: Payer: Self-pay | Admitting: Internal Medicine

## 2019-08-09 ENCOUNTER — Other Ambulatory Visit: Payer: Self-pay | Admitting: Internal Medicine

## 2019-08-09 DIAGNOSIS — M546 Pain in thoracic spine: Secondary | ICD-10-CM

## 2019-08-09 DIAGNOSIS — B009 Herpesviral infection, unspecified: Secondary | ICD-10-CM

## 2019-08-09 LAB — URINE CULTURE: Culture: NO GROWTH

## 2019-08-09 NOTE — Telephone Encounter (Signed)
Pt thinks she needs a xray of her back. States that she has pain in between her shoulder blades. Please advise

## 2019-08-09 NOTE — Telephone Encounter (Signed)
Refilled: 06/28/2019 Last OV: 04/13/2019 Next OV: 08/11/2019

## 2019-08-09 NOTE — Telephone Encounter (Signed)
She needs thoracic spine films  .  Send her back to [facility]

## 2019-08-09 NOTE — Telephone Encounter (Signed)
Pt has a virtual visit with you on Friday. Pt had to go to [facility] lab to have her urine collected because she is being seen on Friday for chills.

## 2019-08-09 NOTE — Telephone Encounter (Signed)
Spoke with pt and she stated that she thinks its from exercising but isn't for sure. She is going to go over to [place] tomorrow to have the xray done. Pt was advised to take ibuprofen or tylenol to help with the pain.

## 2019-08-10 ENCOUNTER — Ambulatory Visit
Admission: RE | Admit: 2019-08-10 | Discharge: 2019-08-10 | Disposition: A | Discharge status: Home / Self Care | Payer: PPO | Source: Ambulatory Visit | Attending: Internal Medicine | Admitting: Internal Medicine

## 2019-08-10 ENCOUNTER — Ambulatory Visit
Admission: RE | Admit: 2019-08-10 | Discharge: 2019-08-10 | Disposition: A | Discharge status: Home / Self Care | Payer: PPO | Attending: Internal Medicine | Admitting: Internal Medicine

## 2019-08-10 DIAGNOSIS — M546 Pain in thoracic spine: Secondary | ICD-10-CM | POA: Insufficient documentation

## 2019-08-10 DIAGNOSIS — M5134 Other intervertebral disc degeneration, thoracic region: Secondary | ICD-10-CM | POA: Diagnosis not present

## 2019-08-10 MED ORDER — ALPRAZOLAM 0.5 MG PO TABS: 0.5000 mg | ORAL_TABLET | Freq: Every evening | ORAL | 0 refills | Status: DC | PRN

## 2019-08-11 ENCOUNTER — Other Ambulatory Visit: Payer: Self-pay

## 2019-08-11 ENCOUNTER — Encounter: Payer: Self-pay | Admitting: Internal Medicine

## 2019-08-11 ENCOUNTER — Ambulatory Visit (INDEPENDENT_AMBULATORY_CARE_PROVIDER_SITE_OTHER): Payer: PPO | Admitting: Internal Medicine

## 2019-08-11 ENCOUNTER — Telehealth: Payer: Self-pay | Admitting: Internal Medicine

## 2019-08-11 VITALS — Ht 63.0 in | Wt 127.0 lb

## 2019-08-11 DIAGNOSIS — R7301 Impaired fasting glucose: Secondary | ICD-10-CM | POA: Diagnosis not present

## 2019-08-11 DIAGNOSIS — D7219 Other eosinophilia: Secondary | ICD-10-CM | POA: Diagnosis not present

## 2019-08-11 DIAGNOSIS — Z789 Other specified health status: Secondary | ICD-10-CM | POA: Diagnosis not present

## 2019-08-11 DIAGNOSIS — D508 Other iron deficiency anemias: Secondary | ICD-10-CM

## 2019-08-11 DIAGNOSIS — N1831 Chronic kidney disease, stage 3a: Secondary | ICD-10-CM

## 2019-08-11 DIAGNOSIS — R5383 Other fatigue: Secondary | ICD-10-CM | POA: Diagnosis not present

## 2019-08-11 DIAGNOSIS — M546 Pain in thoracic spine: Secondary | ICD-10-CM | POA: Diagnosis not present

## 2019-08-11 DIAGNOSIS — R7303 Prediabetes: Secondary | ICD-10-CM | POA: Diagnosis not present

## 2019-08-11 DIAGNOSIS — E559 Vitamin D deficiency, unspecified: Secondary | ICD-10-CM

## 2019-08-11 NOTE — Assessment & Plan Note (Signed)
Her  random glucose is not  elevated but her A1c suggests she is at risk for developing diabetes.  I recommended s he follow a low glycemic index diet and particpate regularly in an aerobic  exercise activity.  We should check an A1c   Lab Results  Component Value Date   HGBA1C 5.9 04/13/2019

## 2019-08-11 NOTE — Assessment & Plan Note (Signed)
Acute,  With no UTI or vertebral fractures on today's workup,  Likely muscle spasm secondary to recent yardwork picking up bricks.  Ibuprofen and tylenol bid

## 2019-08-11 NOTE — Telephone Encounter (Signed)
FYI

## 2019-08-11 NOTE — Telephone Encounter (Signed)
Pt called to let Dr. Derrel Nip know that she took her temp during a chill and it was 96.8

## 2019-08-11 NOTE — Assessment & Plan Note (Signed)
Borderline.  Repeat assessment due.   Lab Results  Component Value Date   CREATININE 0.97 04/13/2019

## 2019-08-11 NOTE — Assessment & Plan Note (Signed)
Has been taking FeoSol for the past 10-15 years despiite having no signs of anemia..  Due to histor yof anemia. Checking iron studies.

## 2019-08-11 NOTE — Progress Notes (Signed)
Telephone note  This visit type was conducted due to national recommendations for restrictions regarding the COVID-19 pandemic (e.g. social distancing).  This format is felt to be most appropriate for this patient at this time.  All issues noted in this document were discussed and addressed.  No physical exam was performed (except for noted visual exam findings with Video Visits).   I connected with@ on 08/11/19 at  1:30 PM EST by telephone and verified that I am speaking with the correct person using two identifiers. Location patient: home Location provider: work or home office Persons participating in the virtual visit: patient, provider  I discussed the limitations, risks, security and privacy concerns of performing an evaluation and management service by telephone and the availability of in person appointments. I also discussed with the patient that there may be a patient responsible charge related to this service. The patient expressed understanding and agreed to proceed.  Reason for visit: back pain   HPI:   Started after spending a few hours picking up wet bricks on a property her son owned after a demolishing of  the building .  The pain was very severe and sharp one time but has been more aching and dull since then.  She was  sent for thoracic spine films  Which were normal except for advanced degenerative changes.  She has been taking one advil at  Bedtime.  The pain is aggravated by housework (vacuuming)  Recurrent chills  for the past 8-9 months ,  Have decreased since she received  the COVID 19 vaccine .  Has not checked her temperature during the chills,  But has checked temperature other times and it has been normal.    [facility] but moving around the house and doing exercise.  Following a careful low glycemic index diet.    ROS: See pertinent positives and negatives per HPI.  Past Medical History:  Diagnosis Date  . Anemia   . Atrial  fibrillation ([place])   . COPD (chronic obstructive pulmonary disease) ([place])   . Coronary artery disease 1995   s/p AMI , no history of stents,  [name]  . Hyperlipidemia   . Ischemic cardiomyopathy Dec 2011   ETT Sestamibi study apical scar, no ischemia, [name]  . Myocardial infarction ([place])   . Recurrent HSV (herpes simplex virus)     Past Surgical History:  Procedure Laterality Date  . ABDOMINAL HYSTERECTOMY  june 2014  . BREAST EXCISIONAL BIOPSY Left 1990's   neg  . 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 CATARACT EXTRACTION  2004, 2006  . JOINT REPLACEMENT  July 2013   total hip , Sabra Heck    Family History  Problem Relation Age of Onset  . Cancer Mother 57       Pancreatic   . Heart disease Father   . Cancer Sister 13       Breast Cancer  . Breast cancer Sister 79    SOCIAL HX:  reports that she quit smoking about 27 years ago. She has never used smokeless tobacco. She reports that she does not drink alcohol or use drugs.   Current Outpatient Medications:  .  acyclovir (ZOVIRAX) 400 MG tablet, TAKE ONE TABLET BY MOUTH EVERY DAY, Disp: 90 tablet, Rfl: 1 .  alendronate (FOSAMAX) 70 MG tablet, Take 1 tablet (70 mg total) by mouth every 7 (seven) days. Take with a full glass of water on  an empty stomach., Disp: 12 tablet, Rfl: 3 .  ALPRAZolam (XANAX) 0.5 MG tablet, Take 1 tablet (0.5 mg total) by mouth at bedtime as needed. for sleep, Disp: 30 tablet, Rfl: 0 .  aspirin 81 MG tablet, Take 160 mg by mouth daily.  , Disp: , Rfl:  .  carbonyl iron (FEOSOL) 45 MG TABS, Take one by mouth daily, Disp: , Rfl:  .  Cholecalciferol (VITAMIN D3) 3000 UNITS TABS, Take 1 tablet by mouth daily. Take one by mouth daily, Disp: 90 tablet, Rfl: 3 .  cyanocobalamin 1000 MCG tablet, Take one by mouth daily , Disp: , Rfl:  .  furosemide (LASIX) 20 MG tablet, Take 1/2 tablet by mouth daily, Disp: 45 tablet, Rfl: 0 .  isosorbide mononitrate (IMDUR) 60 MG 24 hr tablet, Take 1  tablet (60 mg total) by mouth daily., Disp: 90 tablet, Rfl: 1 .  losartan (COZAAR) 25 MG tablet, Take 1 tablet (25 mg total) by mouth daily., Disp: 90 tablet, Rfl: 1 .  magnesium oxide (MAG-OX) 400 MG tablet, Take 400 mg by mouth daily.  , Disp: , Rfl:  .  Omega-3 Fatty Acids (FISH OIL) 1200 MG CAPS, Take one by mouth daily , Disp: , Rfl:  .  pravastatin (PRAVACHOL) 80 MG tablet, Take 1 tablet (80 mg total) by mouth daily., Disp: 90 tablet, Rfl: 1 .  timolol (TIMOPTIC) 0.5 % ophthalmic solution, , Disp: , Rfl:  .  meclizine (ANTIVERT) 25 MG tablet, Take 2 tablets (50 mg total) by mouth 3 (three) times daily as needed for dizziness or nausea. (Patient not taking: Reported on 04/13/2019), Disp: 30 tablet, Rfl: 1  EXAM:  VITALS per patient if applicable:  GENERAL: alert, oriented, appears well and in no acute distress  HEENT: atraumatic, conjunttiva clear, no obvious abnormalities on inspection of external nose and ears  NECK: normal movements of the head and neck  LUNGS: on inspection no signs of respiratory distress, breathing rate appears normal, no obvious gross SOB, gasping or wheezing  CV: no obvious cyanosis  MS: moves all visible extremities without noticeable abnormality  PSYCH/NEURO: pleasant and cooperative, no obvious depression or anxiety, speech and thought processing grossly intact  ASSESSMENT AND PLAN:  Discussed the following assessment and plan:  Stage 3a chronic kidney disease - Plan: Comprehensive metabolic panel  Eosinophilic leukocytosis, unspecified type - Plan: CBC with Differential/Platelet  Impaired fasting glucose - Plan: Hemoglobin A1c  Vitamin D deficiency - Plan: Vitamin D 25 hydroxy  Fatigue, unspecified type - Plan: TSH  Iron deficiency anemia secondary to inadequate dietary iron intake - Plan: Iron, TIBC and Ferritin Panel, CANCELED: Iron, TIBC and Ferritin Panel  Takes iron supplements  Acute midline thoracic back pain  Prediabetes  Takes  iron supplements Has been taking FeoSol for the past 10-15 years despiite having no signs of anemia..  Due to histor yof anemia. Checking iron studies.   Acute midline thoracic back pain Acute,  With no UTI or vertebral fractures on today's workup,  Likely muscle spasm secondary to recent yardwork picking up bricks.  Ibuprofen and tylenol bid    CKD (chronic kidney disease) stage 3, GFR 30-59 ml/min Borderline.  Repeat assessment due.   Lab Results  Component Value Date   CREATININE 0.97 04/13/2019     Prediabetes Her  random glucose is not  elevated but her A1c suggests she is at risk for developing diabetes.  I recommended s he follow a low glycemic index diet and particpate regularly in  an aerobic  exercise activity.  We should check an A1c   Lab Results  Component Value Date   HGBA1C 5.9 04/13/2019       I discussed the assessment and treatment plan with the patient. The patient was provided an opportunity to ask questions and all were answered. The patient agreed with the plan and demonstrated an understanding of the instructions.   The patient was advised to call back or seek an in-person evaluation if the symptoms worsen or if the condition fails to improve as anticipated.  I provided  25 minutes of non-face-to-face time during this encounter reviewing patient's current problems and past procedures/imaging studies, providing counseling on the above mentioned problems , and coordination  of care . Crecencio Mc, MD

## 2019-08-18 ENCOUNTER — Other Ambulatory Visit: Payer: Self-pay | Admitting: Lab

## 2019-08-18 ENCOUNTER — Telehealth: Payer: Self-pay | Admitting: Internal Medicine

## 2019-08-18 DIAGNOSIS — B009 Herpesviral infection, unspecified: Secondary | ICD-10-CM

## 2019-08-18 MED ORDER — PRAVASTATIN SODIUM 80 MG PO TABS: 80.0000 mg | ORAL_TABLET | Freq: Every day | ORAL | 1 refills | Status: DC

## 2019-08-18 MED ORDER — ACYCLOVIR 400 MG PO TABS: 400.0000 mg | ORAL_TABLET | Freq: Every day | ORAL | 1 refills | Status: DC

## 2019-08-18 MED ORDER — ISOSORBIDE MONONITRATE ER 60 MG PO TB24: 60.0000 mg | ORAL_TABLET | Freq: Every day | ORAL | 1 refills | Status: DC

## 2019-08-18 MED ORDER — LOSARTAN POTASSIUM 25 MG PO TABS: 25.0000 mg | ORAL_TABLET | Freq: Every day | ORAL | 1 refills | Status: DC

## 2019-08-18 NOTE — Telephone Encounter (Signed)
Patient is not febrile  She can come in for the labs as long as she is masked

## 2019-08-18 NOTE — Telephone Encounter (Signed)
Pt scheduled for lab work 08/21/19

## 2019-08-18 NOTE — Telephone Encounter (Signed)
Pt called office Pt called to follow up on lab orders and to sch. Pt is having chills. Pt cannot come in the ofc with the symp of chills. Unless Dr Derrel Nip says its ok. Please advise and Thank you!   Call pt @ [phone]

## 2019-08-18 NOTE — Telephone Encounter (Signed)
Pt called to follow up on lab orders and to sch. Pt is having chills. Pt cannot come in the ofc with the symp of chills. Unless Dr Derrel Nip says its ok. Please advise and Thank you!  Call pt @ [phone].

## 2019-08-18 NOTE — Telephone Encounter (Signed)
Refill sent in

## 2019-08-18 NOTE — Telephone Encounter (Signed)
Pt called about needing a new Rx for pravastatin (PRAVACHOL) 80 MG tablet, acyclovir (ZOVIRAX) 400 MG tablet.  Ph[facility] is [facility] - [place], [address]  Call pt @ [phone].

## 2019-08-21 ENCOUNTER — Other Ambulatory Visit: Payer: Self-pay

## 2019-08-21 ENCOUNTER — Other Ambulatory Visit (INDEPENDENT_AMBULATORY_CARE_PROVIDER_SITE_OTHER): Payer: PPO

## 2019-08-21 DIAGNOSIS — D508 Other iron deficiency anemias: Secondary | ICD-10-CM | POA: Diagnosis not present

## 2019-08-21 DIAGNOSIS — I214 Non-ST elevation (NSTEMI) myocardial infarction: Secondary | ICD-10-CM | POA: Diagnosis not present

## 2019-08-21 DIAGNOSIS — N1831 Chronic kidney disease, stage 3a: Secondary | ICD-10-CM

## 2019-08-21 DIAGNOSIS — D7219 Other eosinophilia: Secondary | ICD-10-CM

## 2019-08-21 DIAGNOSIS — R5383 Other fatigue: Secondary | ICD-10-CM | POA: Diagnosis not present

## 2019-08-21 DIAGNOSIS — J449 Chronic obstructive pulmonary disease, unspecified: Secondary | ICD-10-CM | POA: Diagnosis not present

## 2019-08-21 DIAGNOSIS — I255 Ischemic cardiomyopathy: Secondary | ICD-10-CM | POA: Diagnosis not present

## 2019-08-21 DIAGNOSIS — E559 Vitamin D deficiency, unspecified: Secondary | ICD-10-CM | POA: Diagnosis not present

## 2019-08-21 DIAGNOSIS — R7301 Impaired fasting glucose: Secondary | ICD-10-CM

## 2019-08-21 DIAGNOSIS — R079 Chest pain, unspecified: Secondary | ICD-10-CM | POA: Diagnosis not present

## 2019-08-21 DIAGNOSIS — Z9889 Other specified postprocedural states: Secondary | ICD-10-CM | POA: Diagnosis not present

## 2019-08-22 LAB — COMPREHENSIVE METABOLIC PANEL
ALT: 15 U/L (ref 0–35)
AST: 15 U/L (ref 0–37)
Albumin: 4 g/dL (ref 3.5–5.2)
Alkaline Phosphatase: 47 U/L (ref 39–117)
BUN: 24 mg/dL — ABNORMAL HIGH (ref 6–23)
CO2: 27 mEq/L (ref 19–32)
Calcium: 9.8 mg/dL (ref 8.4–10.5)
Chloride: 107 mEq/L (ref 96–112)
Creatinine, Ser: 1 mg/dL (ref 0.40–1.20)
GFR: 53.06 mL/min — ABNORMAL LOW (ref >60.00)
Glucose, Bld: 105 mg/dL — ABNORMAL HIGH (ref 70–99)
Potassium: 4.3 mEq/L (ref 3.5–5.1)
Sodium: 139 mEq/L (ref 135–145)
Total Bilirubin: 0.4 mg/dL (ref 0.2–1.2)
Total Protein: 6.6 g/dL (ref 6.0–8.3)

## 2019-08-22 LAB — TSH: TSH: 1.79 u[IU]/mL (ref 0.35–4.50)

## 2019-08-22 LAB — CBC WITH DIFFERENTIAL/PLATELET
Basophils Absolute: 0.1 10*3/uL (ref 0.0–0.1)
Basophils Relative: 1 % (ref 0.0–3.0)
Eosinophils Absolute: 0.2 10*3/uL (ref 0.0–0.7)
Eosinophils Relative: 2.6 % (ref 0.0–5.0)
HCT: 39.7 % (ref 36.0–46.0)
Hemoglobin: 13.3 g/dL (ref 12.0–15.0)
Lymphocytes Relative: 25.7 % (ref 12.0–46.0)
Lymphs Abs: 2.1 10*3/uL (ref 0.7–4.0)
MCHC: 33.5 g/dL (ref 30.0–36.0)
MCV: 92.7 fl (ref 78.0–100.0)
Monocytes Absolute: 0.7 10*3/uL (ref 0.1–1.0)
Monocytes Relative: 8.1 % (ref 3.0–12.0)
Neutro Abs: 5.1 10*3/uL (ref 1.4–7.7)
Neutrophils Relative %: 62.6 % (ref 43.0–77.0)
Platelets: 176 10*3/uL (ref 150.0–400.0)
RBC: 4.28 Mil/uL (ref 3.87–5.11)
RDW: 13.3 % (ref 11.5–15.5)
WBC: 8.2 10*3/uL (ref 4.0–10.5)

## 2019-08-22 LAB — IBC + FERRITIN
Ferritin: 455.3 ng/mL — ABNORMAL HIGH (ref 10.0–291.0)
Iron: 135 ug/dL (ref 42–145)
Saturation Ratios: 53.3 % — ABNORMAL HIGH (ref 20.0–50.0)
Transferrin: 181 mg/dL — ABNORMAL LOW (ref 212.0–360.0)

## 2019-08-22 LAB — VITAMIN D 25 HYDROXY (VIT D DEFICIENCY, FRACTURES): VITD: 39.91 ng/mL (ref 30.00–100.00)

## 2019-08-22 LAB — HEMOGLOBIN A1C: Hgb A1c MFr Bld: 5.8 % (ref 4.6–6.5)

## 2019-09-27 ENCOUNTER — Other Ambulatory Visit: Payer: Self-pay | Admitting: Internal Medicine

## 2019-10-02 ENCOUNTER — Telehealth: Payer: Self-pay | Admitting: Internal Medicine

## 2019-10-02 DIAGNOSIS — R441 Visual hallucinations: Secondary | ICD-10-CM

## 2019-10-02 NOTE — Telephone Encounter (Signed)
Pt wants to speak to you but would not tell me why

## 2019-10-04 NOTE — Telephone Encounter (Signed)
Spoke with pt and she stated that her children are after her again about something being wrong with her and her memory. Pt stated there is nothing wrong with her and she wants to know if there is some kind of test that can be done to show that she is fine.

## 2019-10-04 NOTE — Telephone Encounter (Signed)
Spoke with pt and she is willing to see a neurologist now.

## 2019-10-04 NOTE — Telephone Encounter (Signed)
Is she willing to see a neurologist to be tested?  She has been referred and not gone in the past for the same issue

## 2019-10-05 NOTE — Addendum Note (Signed)
Addended by: Crecencio Mc on: 10/05/2019 08:09 PM   Modules accepted: Orders

## 2019-10-13 ENCOUNTER — Telehealth: Payer: Self-pay | Admitting: Internal Medicine

## 2019-10-13 NOTE — Telephone Encounter (Signed)
Patient has questions about a referral, she doesn't know who the doctor is and she thinks she will not be going. She wants to know if Dr. Derrel Nip know him.

## 2019-10-17 ENCOUNTER — Other Ambulatory Visit: Payer: Self-pay

## 2019-10-18 NOTE — Telephone Encounter (Signed)
Spoke with pt and she stated that she is not going to go to see Dr. Manuella Ghazi because she doesn't feel like she needs it. She also stated that her children did not want her to see a neurologist, they wanted her to see a psychiatrist and pt feels that she does not need that either. Pt stated that she is going to stop listening to them and do what she feels is right for and and she stated that is only seeing Dr. Derrel Nip and nobody else.

## 2019-10-18 NOTE — Telephone Encounter (Signed)
That is twice now that she has decided not to follow through with the neurology referral that I have made for her.   If she is not willing to go, there is nothing that I can do for her

## 2019-12-06 ENCOUNTER — Other Ambulatory Visit: Payer: Self-pay

## 2019-12-06 MED ORDER — FUROSEMIDE 20 MG PO TABS: 10.0000 mg | ORAL_TABLET | Freq: Every day | ORAL | 1 refills | Status: DC

## 2019-12-12 ENCOUNTER — Other Ambulatory Visit: Payer: Self-pay

## 2019-12-12 MED ORDER — ALENDRONATE SODIUM 70 MG PO TABS: 70.0000 mg | ORAL_TABLET | ORAL | 3 refills | Status: DC

## 2019-12-18 ENCOUNTER — Other Ambulatory Visit: Payer: Self-pay | Admitting: Internal Medicine

## 2019-12-19 NOTE — Telephone Encounter (Signed)
Refill request for Xanax, last seen 08-11-19, last filled 08-10-19.  Please advise.

## 2020-01-08 DIAGNOSIS — H353131 Nonexudative age-related macular degeneration, bilateral, early dry stage: Secondary | ICD-10-CM | POA: Diagnosis not present

## 2020-01-10 ENCOUNTER — Other Ambulatory Visit: Payer: Self-pay

## 2020-01-10 DIAGNOSIS — B009 Herpesviral infection, unspecified: Secondary | ICD-10-CM

## 2020-01-10 MED ORDER — ACYCLOVIR 400 MG PO TABS: 400.0000 mg | ORAL_TABLET | Freq: Every day | ORAL | 1 refills | Status: DC

## 2020-01-13 ENCOUNTER — Other Ambulatory Visit: Payer: Self-pay

## 2020-01-13 ENCOUNTER — Emergency Department
Admission: EM | Admit: 2020-01-13 | Discharge: 2020-01-15 | Disposition: A | Discharge status: Other Acute Inpatient Hospital | Payer: PPO | Attending: Emergency Medicine | Admitting: Emergency Medicine

## 2020-01-13 ENCOUNTER — Emergency Department: Payer: PPO

## 2020-01-13 DIAGNOSIS — I4891 Unspecified atrial fibrillation: Secondary | ICD-10-CM | POA: Diagnosis not present

## 2020-01-13 DIAGNOSIS — F339 Major depressive disorder, recurrent, unspecified: Secondary | ICD-10-CM | POA: Diagnosis not present

## 2020-01-13 DIAGNOSIS — R4182 Altered mental status, unspecified: Secondary | ICD-10-CM | POA: Diagnosis not present

## 2020-01-13 DIAGNOSIS — Z87891 Personal history of nicotine dependence: Secondary | ICD-10-CM | POA: Diagnosis not present

## 2020-01-13 DIAGNOSIS — J449 Chronic obstructive pulmonary disease, unspecified: Secondary | ICD-10-CM | POA: Insufficient documentation

## 2020-01-13 DIAGNOSIS — Z20822 Contact with and (suspected) exposure to covid-19: Secondary | ICD-10-CM | POA: Insufficient documentation

## 2020-01-13 DIAGNOSIS — Z96649 Presence of unspecified artificial hip joint: Secondary | ICD-10-CM | POA: Diagnosis not present

## 2020-01-13 DIAGNOSIS — I252 Old myocardial infarction: Secondary | ICD-10-CM | POA: Insufficient documentation

## 2020-01-13 DIAGNOSIS — Z79899 Other long term (current) drug therapy: Secondary | ICD-10-CM | POA: Insufficient documentation

## 2020-01-13 DIAGNOSIS — F329 Major depressive disorder, single episode, unspecified: Secondary | ICD-10-CM | POA: Diagnosis not present

## 2020-01-13 DIAGNOSIS — F29 Unspecified psychosis not due to a substance or known physiological condition: Secondary | ICD-10-CM | POA: Diagnosis not present

## 2020-01-13 DIAGNOSIS — F0390 Unspecified dementia without behavioral disturbance: Secondary | ICD-10-CM

## 2020-01-13 DIAGNOSIS — Z046 Encounter for general psychiatric examination, requested by authority: Secondary | ICD-10-CM | POA: Diagnosis present

## 2020-01-13 DIAGNOSIS — Z03818 Encounter for observation for suspected exposure to other biological agents ruled out: Secondary | ICD-10-CM | POA: Diagnosis not present

## 2020-01-13 DIAGNOSIS — I2510 Atherosclerotic heart disease of native coronary artery without angina pectoris: Secondary | ICD-10-CM | POA: Diagnosis not present

## 2020-01-13 DIAGNOSIS — F062 Psychotic disorder with delusions due to known physiological condition: Secondary | ICD-10-CM | POA: Diagnosis not present

## 2020-01-13 LAB — URINALYSIS, COMPLETE (UACMP) WITH MICROSCOPIC
Bacteria, UA: NONE SEEN
Bilirubin Urine: NEGATIVE
Glucose, UA: NEGATIVE mg/dL
Hgb urine dipstick: NEGATIVE
Ketones, ur: NEGATIVE mg/dL
Nitrite: NEGATIVE
Protein, ur: NEGATIVE mg/dL
Specific Gravity, Urine: 1.009 (ref 1.005–1.030)
Squamous Epithelial / HPF: NONE SEEN (ref 0–5)
pH: 6 (ref 5.0–8.0)

## 2020-01-13 LAB — URINE DRUG SCREEN, QUALITATIVE (ARMC ONLY)
Amphetamines, Ur Screen: NOT DETECTED
Barbiturates, Ur Screen: NOT DETECTED
Benzodiazepine, Ur Scrn: POSITIVE — AB
Cannabinoid 50 Ng, Ur ~~LOC~~: NOT DETECTED
Cocaine Metabolite,Ur ~~LOC~~: NOT DETECTED
MDMA (Ecstasy)Ur Screen: NOT DETECTED
Methadone Scn, Ur: NOT DETECTED
Opiate, Ur Screen: NOT DETECTED
Phencyclidine (PCP) Ur S: NOT DETECTED
Tricyclic, Ur Screen: NOT DETECTED

## 2020-01-13 LAB — CBC
HCT: 39.4 % (ref 36.0–46.0)
Hemoglobin: 13.1 g/dL (ref 12.0–15.0)
MCH: 30.4 pg (ref 26.0–34.0)
MCHC: 33.2 g/dL (ref 30.0–36.0)
MCV: 91.4 fL (ref 80.0–100.0)
Platelets: 190 10*3/uL (ref 150–400)
RBC: 4.31 MIL/uL (ref 3.87–5.11)
RDW: 12.7 % (ref 11.5–15.5)
WBC: 7.3 10*3/uL (ref 4.0–10.5)
nRBC: 0 % (ref 0.0–0.2)

## 2020-01-13 LAB — COMPREHENSIVE METABOLIC PANEL
ALT: 18 U/L (ref 0–44)
AST: 20 U/L (ref 15–41)
Albumin: 3.8 g/dL (ref 3.5–5.0)
Alkaline Phosphatase: 51 U/L (ref 38–126)
Anion gap: 7 (ref 5–15)
BUN: 19 mg/dL (ref 8–23)
CO2: 26 mmol/L (ref 22–32)
Calcium: 9.6 mg/dL (ref 8.9–10.3)
Chloride: 106 mmol/L (ref 98–111)
Creatinine, Ser: 1.05 mg/dL — ABNORMAL HIGH (ref 0.44–1.00)
GFR calc Af Amer: 57 mL/min — ABNORMAL LOW (ref >60)
GFR calc non Af Amer: 49 mL/min — ABNORMAL LOW (ref >60)
Glucose, Bld: 135 mg/dL — ABNORMAL HIGH (ref 70–99)
Potassium: 4.4 mmol/L (ref 3.5–5.1)
Sodium: 139 mmol/L (ref 135–145)
Total Bilirubin: 1 mg/dL (ref 0.3–1.2)
Total Protein: 7.1 g/dL (ref 6.5–8.1)

## 2020-01-13 LAB — ACETAMINOPHEN LEVEL: Acetaminophen (Tylenol), Serum: <10 ug/mL — ABNORMAL LOW (ref 10–30)

## 2020-01-13 LAB — ETHANOL: Alcohol, Ethyl (B): <10 mg/dL (ref <10)

## 2020-01-13 LAB — SARS CORONAVIRUS 2 BY RT PCR (HOSPITAL ORDER, PERFORMED IN ~~LOC~~ HOSPITAL LAB): SARS Coronavirus 2: NEGATIVE

## 2020-01-13 LAB — SALICYLATE LEVEL: Salicylate Lvl: <7 mg/dL — ABNORMAL LOW (ref 7.0–30.0)

## 2020-01-13 MED ORDER — HALOPERIDOL 0.5 MG PO TABS
0.5000 mg | ORAL_TABLET | Freq: Every day | ORAL | Status: DC
  Administered 2020-01-14 (×2): 0.5 mg via ORAL
  Filled 2020-01-13 (×2): qty 1

## 2020-01-13 MED ORDER — HALOPERIDOL 0.5 MG PO TABS
0.2500 mg | ORAL_TABLET | Freq: Every day | ORAL | Status: DC
  Administered 2020-01-13: 0.25 mg via ORAL
  Filled 2020-01-13: qty 1
  Filled 2020-01-13: qty 0.5

## 2020-01-13 MED ORDER — BENZTROPINE MESYLATE 0.5 MG PO TABS
0.5000 mg | ORAL_TABLET | Freq: Every day | ORAL | Status: DC
  Administered 2020-01-14 (×2): 0.5 mg via ORAL
  Filled 2020-01-13 (×4): qty 1

## 2020-01-13 NOTE — BH Assessment (Addendum)
Assessment Note  Audrey Duran is an 85 y.o. female who presents to the ER under IVC, which was petitioned by her daughter. Per the report of the patient, she doesn't know why she was brought to the ER. Patient admits to being inpatient approximately sixty years ago, after she gave birth to her son. "They told me I had a breakdown or something because of the baby..."  Per the report of the patient's daughter (Audrey Duran), the patient believes her neighbors are spying on her and trying to take her home, by doing things to it. This has occurred for the last two years. Family have installed cameras and reviewed the videos with her to help her understanding what she was believing wasn't true. Within the last week, the thoughts have increased, as well as her behaviors. Patient now believes the neighbors are apart of a cult and they are naked outside of her home. She's able to see them in her yard and see into their homes, through the door's peephole. They have start digging tunnels under her home, to avoid been seen by cameras, trying to get to her.  Family believes the patient is about to get hurt because she's now yelling at and confronting different neighbors. Yesterday (01/12/2020), the daughter received multiple phone calls from different neighbors, the landlord and another family member. They made several attempts to have the patient come to the ER voluntary but she refused, thus, they petitioned for her to be under IVC. Daughter states, her PCP started her on medications, approximately two years ago, but the patient stop taking it because of the way it made her feel.  During the interview, the patient was calm, cooperative and pleasant. She was able to provide appropriate answers to the questions. When asked about the neighbors and what her daughter shared, she wouldn't deny it but state the daughter is "to fancy for her own good." Patient lives independently and able to complete her ADL's  without any assistance. She reports of going to the [facility] twice a week for exercise and doesn't understand why her daughter can make her come to the ER because she doesn't need any help. Throughout the interview, she denied SI/HI and AV/H. Patient was orient x4.   Diagnosis: Psychosis  Past Medical History:  Past Medical History:  Diagnosis Date  . Anemia   . Atrial fibrillation ([place])   . COPD (chronic obstructive pulmonary disease) ([place])   . Coronary artery disease 1995   s/p AMI , no history of stents,  [name]  . Hyperlipidemia   . Ischemic cardiomyopathy Dec 2011   ETT Sestamibi study apical scar, no ischemia, [name]  . Myocardial infarction ([place])   . Recurrent HSV (herpes simplex virus)     Past Surgical History:  Procedure Laterality Date  . ABDOMINAL HYSTERECTOMY  june 2014  . BREAST EXCISIONAL BIOPSY Left 1990's   neg  . 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 CATARACT EXTRACTION  2004, 2006  . JOINT REPLACEMENT  July 2013   total hip , Sabra Heck    Family History:  Family History  Problem Relation Age of Onset  . Cancer Mother 60       Pancreatic   . Heart disease Father   . Cancer Sister 65       Breast Cancer  . Breast cancer Sister 43    Social History:  reports that she quit smoking about 28 years ago. She has never used smokeless tobacco. She  reports that she does not drink alcohol and does not use drugs.  Additional Social History:  Alcohol / Drug Use Pain Medications: See PTA Prescriptions: See PTA Over the Counter: See PTA History of alcohol / drug use?: No history of alcohol / drug abuse Longest period of sobriety (when/how long): n/a Negative Consequences of Use: Personal relationships  CIWA: CIWA-Ar BP: 138/73 Pulse Rate: 63 COWS:    Allergies:  Allergies  Allergen Reactions  . Azithromycin     GI upset  . Clarithromycin     ? Rash  . Diclofenac     ? Rash  . Lisinopril Other (See Comments)    hyperkalemia  .  Meloxicam     ? Rash  . Nitrofurantoin Monohyd Macro     ? rash  . Zocor [Simvastatin]   . Penicillins Rash    Home Medications: (Not in a hospital admission)   OB/GYN Status:  No LMP recorded. Patient has had a hysterectomy.  General Assessment Data Location of Assessment: [facility] ED TTS Assessment: In system Is this a Tele or Face-to-Face Assessment?: Face-to-Face Is this an Initial Assessment or a Re-assessment for this encounter?: Initial Assessment Patient Accompanied by:: N/A Language Other than English: No Living Arrangements: Other (Comment) (Private Home) What gender do you identify as?: Female Date Telepsych consult ordered in [facility]: 01/13/20 Time Telepsych consult ordered in [facility]: 1526 Marital status: Single Pregnancy Status: No Living Arrangements: Alone Can pt return to current living arrangement?: No Admission Status: Involuntary Petitioner: Other Is patient capable of signing voluntary admission?: No (Under IVC) Referral Source: Self/Family/Friend Insurance type: [employer]  Medical Screening Exam ([place]) Medical Exam completed: Yes  Crisis Care Plan Living Arrangements: Alone Legal Guardian: Other: (Self) Name of Psychiatrist: Reports of none Name of Therapist: Reports of none  Education Status Is patient currently in school?: No Is the patient employed, unemployed or receiving disability?: Unemployed  Risk to self with the past 6 months Suicidal Ideation: No Has patient been a risk to self within the past 6 months prior to admission? : No Suicidal Intent: No Has patient had any suicidal intent within the past 6 months prior to admission? : No Is patient at risk for suicide?: No Suicidal Plan?: No Has patient had any suicidal plan within the past 6 months prior to admission? : No Access to Means: No What has been your use of drugs/alcohol within the last 12 months?: Reports of none Previous Attempts/Gestures: No Other Self Harm  Risks: Reports of none Triggers for Past Attempts: None known Intentional Self Injurious Behavior: None Family Suicide History: No Recent stressful life event(s): Other (Comment) Persecutory voices/beliefs?: No Depression: Yes Depression Symptoms: Feeling worthless/self pity, Isolating, Insomnia Substance abuse history and/or treatment for substance abuse?: No Suicide prevention information given to non-admitted patients: Not applicable  Risk to Others within the past 6 months Homicidal Ideation: No Does patient have any lifetime risk of violence toward others beyond the six months prior to admission? : No Thoughts of Harm to Others: No Current Homicidal Intent: No Current Homicidal Plan: No Access to Homicidal Means: No Identified Victim: Reports of none History of harm to others?: No Assessment of Violence: None Noted Violent Behavior Description: Reports of none Does patient have access to weapons?: No Criminal Charges Pending?: No Does patient have a court date: No Is patient on probation?: No  Psychosis Hallucinations: Auditory, Visual (Per daughter's report) Delusions: Persecutory  Mental Status Report Appearance/Hygiene: Unremarkable, In scrubs Eye Contact: Good Motor  Activity: Freedom of movement, Unremarkable Speech: Logical/coherent, Unremarkable Level of Consciousness: Alert Mood: Depressed, Anxious, Pleasant Affect: Appropriate to circumstance Anxiety Level: None Thought Processes: Coherent, Relevant Judgement: Unimpaired Orientation: Person, Place, Time, Situation, Appropriate for developmental age Obsessive Compulsive Thoughts/Behaviors: Minimal  Cognitive Functioning Concentration: Normal Memory: Recent Intact, Remote Intact Is patient IDD: No Insight: Fair Impulse Control: Fair Appetite: Good Have you had any weight changes? : No Change Sleep: No Change Total Hours of Sleep: 8 Vegetative Symptoms: None  ADLScreening [facility] Assessment  Services) Patient's cognitive ability adequate to safely complete daily activities?: Yes Patient able to express need for assistance with ADLs?: Yes Independently performs ADLs?: Yes (appropriate for developmental age)  Prior Inpatient Therapy Prior Inpatient Therapy: No  Prior Outpatient Therapy Prior Outpatient Therapy: No Does patient have an ACCT team?: No Does patient have Intensive In-House Services?  : No Does patient have [facility] services? : No Does patient have P4CC services?: No  ADL Screening (condition at time of admission) Patient's cognitive ability adequate to safely complete daily activities?: Yes Is the patient deaf or have difficulty hearing?: No Does the patient have difficulty seeing, even when wearing glasses/contacts?: No Does the patient have difficulty concentrating, remembering, or making decisions?: No Patient able to express need for assistance with ADLs?: Yes Does the patient have difficulty dressing or bathing?: No Independently performs ADLs?: Yes (appropriate for developmental age) Does the patient have difficulty walking or climbing stairs?: No Weakness of Legs: None Weakness of Arms/Hands: None  Home Assistive Devices/Equipment Home Assistive Devices/Equipment: None  Therapy Consults (therapy consults require a physician order) PT Evaluation Needed: No OT Evalulation Needed: No SLP Evaluation Needed: No Abuse/Neglect Assessment (Assessment to be complete while patient is alone) Abuse/Neglect Assessment Can Be Completed: Yes Physical Abuse: Denies Verbal Abuse: Denies Sexual Abuse: Denies Exploitation of patient/patient's resources: Denies Self-Neglect: Denies Values / Beliefs Cultural Requests During Hospitalization: None Spiritual Requests During Hospitalization: None Consults Spiritual Care Consult Needed: No Transition of Care Team Consult Needed: No Advance Directives (For Healthcare) Does Patient Have a Medical Advance Directive?:  Yes Type of Advance Directive: Healthcare Power of Attorney  Disposition:  Disposition Initial Assessment Completed for this Encounter: Yes  On Site Evaluation by:   Reviewed with Physician:    Gunnar Fusi MS, LCAS, [facility], [place] Therapeutic Triage Specialist 01/13/2020 5:38 PM

## 2020-01-13 NOTE — ED Notes (Signed)
Pt changed into hospital scrubs.  Pt belongings: 1 phone (turned off), set of keys, shorts, crocs, black bra, white underwear, white tshirt. Placed in 1 belongings bag.   Pt calm and cooperative during dress out process.

## 2020-01-13 NOTE — BH Assessment (Addendum)
Referral information for Psychiatric Hospitalization faxed to;    [facility] ([phone]- [phone]),    [facility] [phone] -or- [phone]) [phone] No current intake staff available until after [place] ([phone]---[phone]), Under review with Ronaldo Miyamoto [phone] -or- [phone]), No current Geri beds   [facility] [phone] or [phone]) Currently under review with Yanique as of  01/14/20 5:25am   [place] [phone] or [phone]), Has not been reviewed yet as of 01/14/19 5:37am   [place] [phone]).

## 2020-01-13 NOTE — Consult Note (Signed)
[facility] Face-to-Face Psychiatry Consult   Reason for Consult: Psychosis   Referring Physician:  ED MD   Patient Identification: Audrey Duran MRN:  [id number]  Principal Diagnosis:  Psychosis NOS   Diagnosis:    Same   Principle Problem:  Worsening delusions and psychotic issues at home, lives alone and daughter filed IVC due to worsening dysfunction    Total Time spent with patient: 50-60 min   Subjective:     Audrey Duran is a 85 y.o. female patient admitted with worsening psychosis     HPI:  Ongoing issues with psychosis   Patient is preoccupied with neighbor wanting to take her house and being intrusive ; she feels like a cult is invading her home, she is preoccupied with the [place] and other ongoing problems.   Daughter concerned about worsening paranoia fear and suspiciousness.  First noted this past Spring, now worsening requiring inpatient psych admit and medications     Past Psychiatric History: None recently   Had post partum depression 60 years ago   Risk to Self:  possible risk of clinical deterioration if not admitted   Risk to Others:  none currently  Prior Inpatient Therapy:  none  Prior Outpatient Therapy:  none   Currently on no psych medications  Past Medical History:  Past Medical History:  Diagnosis Date  . Anemia   . Atrial fibrillation ([place])   . COPD (chronic obstructive pulmonary disease) ([place])   . Coronary artery disease 1995   s/p AMI , no history of stents,  [name]  . Hyperlipidemia   . Ischemic cardiomyopathy Dec 2011   ETT Sestamibi study apical scar, no ischemia, [name]  . Myocardial infarction ([place])   . Recurrent HSV (herpes simplex virus)     Past Surgical History:  Procedure Laterality Date  . ABDOMINAL HYSTERECTOMY  june 2014  . BREAST EXCISIONAL BIOPSY Left 1990's   neg  . 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 CATARACT EXTRACTION  2004, 2006  . JOINT REPLACEMENT  July 2013   total hip  , [place]   Family History:  Family History  Problem Relation Age of Onset  . Cancer Mother 15       Pancreatic   . Heart disease Father   . Cancer Sister 50       Breast Cancer  . Breast cancer Sister 41   Family Psychiatric  History: none is reported   Social History:  Lives on her own, daughters check on her --widowed, retired, has pension Social History   Substance and Sexual Activity  Alcohol Use No  . Alcohol/week: 0.0 standard drinks     Social History   Substance and Sexual Activity  Drug Use No    Social History   Socioeconomic History  . Marital status: Divorced    Spouse name: Not on file  . Number of children: Not on file  . Years of education: Not on file  . Highest education level: Not on file  Occupational History  . Not on file  Tobacco Use  . Smoking status: Former Smoker    Quit date: 01/13/1992    Years since quitting: 28.0  . Smokeless tobacco: Never Used  Substance and Sexual Activity  . Alcohol use: No    Alcohol/week: 0.0 standard drinks  . Drug use: No  . Sexual activity: Not Currently  Other Topics Concern  . Not on file  Social History Narrative  . Not on file  Social Determinants of Health   Financial Resource Strain:   . Difficulty of Paying Living Expenses:   Food Insecurity:   . Worried About Charity fundraiser in the Last Year:   . Arboriculturist in the Last Year:   Transportation Needs:   . Film/video editor (Medical):   Marland Kitchen Lack of Transportation (Non-Medical):   Physical Activity:   . Days of Exercise per Week:   . Minutes of Exercise per Session:   Stress:   . Feeling of Stress :   Social Connections:   . Frequency of Communication with Friends and Family:   . Frequency of Social Gatherings with Friends and Family:   . Attends Religious Services:   . Active Member of Clubs or Organizations:   . Attends Archivist Meetings:   Marland Kitchen Marital Status:    Additional Social History:    Allergies:    Allergies  Allergen Reactions  . Azithromycin     GI upset  . Clarithromycin     ? Rash  . Diclofenac     ? Rash  . Lisinopril Other (See Comments)    hyperkalemia  . Meloxicam     ? Rash  . Nitrofurantoin Monohyd Macro     ? rash  . Zocor [Simvastatin]   . Penicillins Rash    Labs:  Results for orders placed or performed during the hospital encounter of 01/13/20 (from the past 48 hour(s))  Comprehensive metabolic panel     Status: Abnormal   Collection Time: 01/13/20  2:06 PM  Result Value Ref Range   Sodium 139 135 - 145 mmol/L   Potassium 4.4 3.5 - 5.1 mmol/L   Chloride 106 98 - 111 mmol/L   CO2 26 22 - 32 mmol/L   Glucose, Bld 135 (H) 70 - 99 mg/dL    Comment: Glucose reference range applies only to samples taken after fasting for at least 8 hours.   BUN 19 8 - 23 mg/dL   Creatinine, Ser 1.05 (H) 0.44 - 1.00 mg/dL   Calcium 9.6 8.9 - 10.3 mg/dL   Total Protein 7.1 6.5 - 8.1 g/dL   Albumin 3.8 3.5 - 5.0 g/dL   AST 20 15 - 41 U/L   ALT 18 0 - 44 U/L   Alkaline Phosphatase 51 38 - 126 U/L   Total Bilirubin 1.0 0.3 - 1.2 mg/dL   GFR calc non Af Amer 49 (L) >60 mL/min   GFR calc Af Amer 57 (L) >60 mL/min   Anion gap 7 5 - 15    Comment: Performed at [facility], [address], [place], [place] [address]  Ethanol     Status: None   Collection Time: 01/13/20  2:06 PM  Result Value Ref Range   Alcohol, Ethyl (B) <10 <10 mg/dL    Comment: (NOTE) Lowest detectable limit for serum alcohol is 10 mg/dL.  For medical purposes only. Performed at [facility], [place], [place] [address]   Salicylate level     Status: Abnormal   Collection Time: 01/13/20  2:06 PM  Result Value Ref Range   Salicylate Lvl <7.5 (L) 7.0 - 30.0 mg/dL    Comment: Performed at [facility], [address] [place], [place] [address]  Acetaminophen level     Status: Abnormal   Collection Time: 01/13/20  2:06 PM  Result Value Ref Range    Acetaminophen (Tylenol), Serum <10 (L) 10 - 30 ug/mL  Comment: (NOTE) Therapeutic concentrations vary significantly. A range of 10-30 ug/mL  may be an effective concentration for many patients. However, some  are best treated at concentrations outside of this range. Acetaminophen concentrations >150 ug/mL at 4 hours after ingestion  and >50 ug/mL at 12 hours after ingestion are often associated with  toxic reactions.  Performed at [facility], [address] [place], [place] [address]   cbc     Status: None   Collection Time: 01/13/20  2:06 PM  Result Value Ref Range   WBC 7.3 4.0 - 10.5 K/uL   RBC 4.31 3.87 - 5.11 MIL/uL   Hemoglobin 13.1 12.0 - 15.0 g/dL   HCT 39.4 36 - 46 %   MCV 91.4 80.0 - 100.0 fL   MCH 30.4 26.0 - 34.0 pg   MCHC 33.2 30.0 - 36.0 g/dL   RDW 12.7 11.5 - 15.5 %   Platelets 190 150 - 400 K/uL   nRBC 0.0 0.0 - 0.2 %    Comment: Performed at [facility], [address], [place], [place] [address]  Urine Drug Screen, Qualitative     Status: Abnormal   Collection Time: 01/13/20  2:06 PM  Result Value Ref Range   Tricyclic, Ur Screen NONE DETECTED NONE DETECTED   Amphetamines, Ur Screen NONE DETECTED NONE DETECTED   MDMA (Ecstasy)Ur Screen NONE DETECTED NONE DETECTED   Cocaine Metabolite,Ur [place] NONE DETECTED NONE DETECTED   Opiate, Ur Screen NONE DETECTED NONE DETECTED   Phencyclidine (PCP) Ur S NONE DETECTED NONE DETECTED   Cannabinoid 50 Ng, Ur [place] NONE DETECTED NONE DETECTED   Barbiturates, Ur Screen NONE DETECTED NONE DETECTED   Benzodiazepine, Ur Scrn POSITIVE (A) NONE DETECTED   Methadone Scn, Ur NONE DETECTED NONE DETECTED    Comment: (NOTE) Tricyclics + metabolites, urine    Cutoff 1000 ng/mL Amphetamines + metabolites, urine  Cutoff 1000 ng/mL MDMA (Ecstasy), urine              Cutoff 500 ng/mL Cocaine Metabolite, urine          Cutoff 300 ng/mL Opiate + metabolites, urine        Cutoff 300 ng/mL Phencyclidine (PCP), urine          Cutoff 25 ng/mL Cannabinoid, urine                 Cutoff 50 ng/mL Barbiturates + metabolites, urine  Cutoff 200 ng/mL Benzodiazepine, urine              Cutoff 200 ng/mL Methadone, urine                   Cutoff 300 ng/mL  The urine drug screen provides only a preliminary, unconfirmed analytical test result and should not be used for non-medical purposes. Clinical consideration and professional judgment should be applied to any positive drug screen result due to possible interfering substances. A more specific alternate chemical method must be used in order to obtain a confirmed analytical result. Gas chromatography / mass spectrometry (GC/MS) is the preferred confirm atory method. Performed at [facility], [address] [place], [place] [address]     No current facility-administered medications for this encounter.   Current Outpatient Medications  Medication Sig Dispense Refill  . acyclovir (ZOVIRAX) 400 MG tablet Take 1 tablet (400 mg total) by mouth daily. 90 tablet 1  . alendronate (FOSAMAX) 70 MG tablet Take 1 tablet (70 mg total) by mouth every 7 (seven)  days. Take with a full glass of water on an empty stomach. 12 tablet 3  . ALPRAZolam (XANAX) 0.5 MG tablet TAKE 1 TABLET BY MOUTH AT BEDTIME AS NEEDED FOR SLEEP 30 tablet 1  . aspirin 81 MG tablet Take 160 mg by mouth daily.      . carbonyl iron (FEOSOL) 45 MG TABS Take one by mouth daily    . Cholecalciferol (VITAMIN D3) 3000 UNITS TABS Take 1 tablet by mouth daily. Take one by mouth daily 90 tablet 3  . cyanocobalamin 1000 MCG tablet Take one by mouth daily     . furosemide (LASIX) 20 MG tablet Take 0.5 tablets (10 mg total) by mouth daily. 45 tablet 1  . isosorbide mononitrate (IMDUR) 60 MG 24 hr tablet Take 1 tablet (60 mg total) by mouth daily. 90 tablet 1  . losartan (COZAAR) 25 MG tablet Take 1 tablet (25 mg total) by mouth daily. 90 tablet 1  . magnesium oxide (MAG-OX) 400 MG tablet Take 400 mg by  mouth daily.      . meclizine (ANTIVERT) 25 MG tablet Take 2 tablets (50 mg total) by mouth 3 (three) times daily as needed for dizziness or nausea. (Patient not taking: Reported on 04/13/2019) 30 tablet 1  . Omega-3 Fatty Acids (FISH OIL) 1200 MG CAPS Take one by mouth daily     . pravastatin (PRAVACHOL) 80 MG tablet Take 1 tablet (80 mg total) by mouth daily. 90 tablet 1  . timolol (TIMOPTIC) 0.5 % ophthalmic solution       Musculoskeletal: Strength & Muscle Tone: normal  Gait & Station: normal  Patient leans:  N/a   Psychiatric Specialty Exam: Physical Exam  Review of Systems  Blood pressure 138/73, pulse 63, temperature 98.5 F (36.9 C), temperature source Oral, resp. rate 16, height 5\' 3"  (1.6 m), weight 55.8 kg, SpO2 95 %.Body mass index is 21.79 kg/m.     Mental Status Exam   Alert thin caucasian female Appearance normal  Well groomed  Oriented to person place date and time Consciousness not clouded or fluctuant  No movement problems Concentration and attention normal Rapport normal  Mood slightly anxious Affect normal  Thought process --normal Thought content --elaborate ongoing delusions already described Memory --remote recent and immediate okay through general questions Fund of knowledge and intelligence normal No active SI HI or plans  Abstraction normal  Proverbs normal  Judgement insight poor --does not feel she has a mental illness and does not understand if not treated what would happen  Reliability fair  Speech --normal rate tone volume fluency                                                  ADL's:  Psychosis interfering with functioning    Cognition:  Normal   Sleep:   decreased due to delusions arising      Treatment Plan Summary:  Admitted for observation and treatment. Low dose haldol to be given along with cogentin for now   CT scan pending ---being seen as a new onset psychosis    Disposition: will be admitted  here or to [facility] psych ---pending --but can transfer to [place] for now   Eulas Post, MD 01/13/2020 3:15 PM

## 2020-01-13 NOTE — ED Notes (Addendum)
Patient is refusing medications from the doctor and stating she wants to go home. Patient says she doesn't want to take any medications at this time. Patient is tearful and wants to get a lawyer

## 2020-01-13 NOTE — ED Notes (Signed)
Introduced self to pt. Pt states that she was walking to the mailbox and suddenly officers came up and brought her here because her daughter took out papers. Pt denies any voices or hallucinations. Daughter reports on paperwork that pt is paranoid that the neighbor across the street wants her house. Pt agrees to this and states "he has done mean things because he wants my house" then starts talking about her mobile home trailer park and the people who own it. Oriented to questions.

## 2020-01-13 NOTE — ED Notes (Signed)
Patient talking with psychiatrist

## 2020-01-13 NOTE — ED Notes (Signed)
Snack given.

## 2020-01-13 NOTE — BH Assessment (Addendum)
Referral information for Psychiatric Hospitalization faxed to;   Marland Kitchen [facility] [phone]),   . [facility] [phone] -or- [phone]) [phone]  . [facility] ([phone]),  . [facility] [phone] -or- [phone]),   . [facility] [phone] or [phone])  . [place] [phone] or [phone]),   . [facility] [phone]).   Writer updated patient's daughter's ([phone]).

## 2020-01-13 NOTE — ED Notes (Signed)
IVC patient moved to [facility]

## 2020-01-13 NOTE — ED Triage Notes (Signed)
Pt arrives to ER under IVC. Pt crying in triage. IVC taken out by daughter. Pt states her daughter is just "glad that she doesn't have to care of me" paperwork states that she is seeing things and thinking a cult lives near by. Paperwork also states that landlord is scared pt will hurt herself.   Pt lives alone.

## 2020-01-14 ENCOUNTER — Emergency Department: Payer: PPO

## 2020-01-14 DIAGNOSIS — I4891 Unspecified atrial fibrillation: Secondary | ICD-10-CM | POA: Diagnosis not present

## 2020-01-14 MED ORDER — LOSARTAN POTASSIUM 25 MG PO TABS
25.0000 mg | ORAL_TABLET | Freq: Every day | ORAL | Status: DC
  Administered 2020-01-14 – 2020-01-15 (×2): 25 mg via ORAL
  Filled 2020-01-14 (×2): qty 1

## 2020-01-14 MED ORDER — ACYCLOVIR 200 MG PO CAPS
400.0000 mg | ORAL_CAPSULE | Freq: Every day | ORAL | Status: DC
  Administered 2020-01-14 – 2020-01-15 (×2): 400 mg via ORAL
  Filled 2020-01-14 (×2): qty 2

## 2020-01-14 MED ORDER — OMEGA-3-ACID ETHYL ESTERS 1 G PO CAPS
1.0000 g | ORAL_CAPSULE | Freq: Every day | ORAL | Status: DC
  Administered 2020-01-14 – 2020-01-15 (×2): 1 g via ORAL
  Filled 2020-01-14 (×2): qty 1

## 2020-01-14 MED ORDER — VITAMIN B-12 1000 MCG PO TABS
500.0000 ug | ORAL_TABLET | Freq: Every day | ORAL | Status: DC
  Administered 2020-01-14 – 2020-01-15 (×2): 500 ug via ORAL
  Filled 2020-01-14 (×2): qty 1

## 2020-01-14 MED ORDER — ASPIRIN EC 81 MG PO TBEC
81.0000 mg | DELAYED_RELEASE_TABLET | Freq: Every day | ORAL | Status: DC
  Administered 2020-01-14: 81 mg via ORAL
  Filled 2020-01-14: qty 1

## 2020-01-14 MED ORDER — DULOXETINE HCL 20 MG PO CPEP
20.0000 mg | ORAL_CAPSULE | Freq: Every day | ORAL | Status: DC
  Filled 2020-01-14 (×2): qty 1

## 2020-01-14 MED ORDER — CLONAZEPAM 0.25 MG PO TBDP
0.2500 mg | ORAL_TABLET | Freq: Two times a day (BID) | ORAL | Status: DC
  Administered 2020-01-14: 0.25 mg via ORAL
  Filled 2020-01-14: qty 1

## 2020-01-14 MED ORDER — ALENDRONATE SODIUM 70 MG PO TABS: 70.0000 mg | ORAL_TABLET | ORAL | Status: DC

## 2020-01-14 MED ORDER — PRAVASTATIN SODIUM 40 MG PO TABS
80.0000 mg | ORAL_TABLET | Freq: Every day | ORAL | Status: DC
  Administered 2020-01-14: 80 mg via ORAL
  Filled 2020-01-14 (×2): qty 2

## 2020-01-14 MED ORDER — TIMOLOL MALEATE 0.5 % OP SOLN
1.0000 [drp] | Freq: Two times a day (BID) | OPHTHALMIC | Status: DC
  Administered 2020-01-14 – 2020-01-15 (×3): 1 [drp] via OPHTHALMIC
  Filled 2020-01-14: qty 5

## 2020-01-14 MED ORDER — ALPRAZOLAM 0.5 MG PO TABS: 0.5000 mg | ORAL_TABLET | Freq: Every evening | ORAL | Status: DC | PRN

## 2020-01-14 MED ORDER — MAGNESIUM OXIDE 400 (241.3 MG) MG PO TABS
400.0000 mg | ORAL_TABLET | Freq: Every day | ORAL | Status: DC
  Administered 2020-01-14 – 2020-01-15 (×2): 400 mg via ORAL
  Filled 2020-01-14 (×3): qty 1

## 2020-01-14 MED ORDER — FUROSEMIDE 20 MG PO TABS
10.0000 mg | ORAL_TABLET | Freq: Every day | ORAL | Status: DC
  Administered 2020-01-14 – 2020-01-15 (×2): 10 mg via ORAL
  Filled 2020-01-14 (×2): qty 0.5

## 2020-01-14 MED ORDER — VITAMIN D3 25 MCG (1000 UNIT) PO TABS
3000.0000 [IU] | ORAL_TABLET | Freq: Every day | ORAL | Status: DC
  Administered 2020-01-14 – 2020-01-15 (×2): 3000 [IU] via ORAL
  Filled 2020-01-14 (×4): qty 3

## 2020-01-14 MED ORDER — ACETAMINOPHEN 325 MG PO TABS
650.0000 mg | ORAL_TABLET | Freq: Four times a day (QID) | ORAL | Status: DC | PRN
  Administered 2020-01-14: 650 mg via ORAL
  Filled 2020-01-14: qty 2

## 2020-01-14 MED ORDER — FERROUS SULFATE 325 (65 FE) MG PO TABS
325.0000 mg | ORAL_TABLET | Freq: Every day | ORAL | Status: DC
  Administered 2020-01-14 – 2020-01-15 (×2): 325 mg via ORAL
  Filled 2020-01-14 (×2): qty 1

## 2020-01-14 MED ORDER — ISOSORBIDE MONONITRATE ER 60 MG PO TB24
60.0000 mg | ORAL_TABLET | Freq: Every day | ORAL | Status: DC
  Administered 2020-01-14 – 2020-01-15 (×2): 60 mg via ORAL
  Filled 2020-01-14 (×3): qty 1

## 2020-01-14 NOTE — BH Assessment (Signed)
TTS completed reassessment. Pt presented calm, pleasant and oriented x 3. Pt reports to be feeling "ok" and inquired about INPT. Pt denies any current SI/HI/AH/VH but was unable to contract for safety.   Per Dr. Janese Banks pt meet criteria for inpatient treatment.  Caryl Pina contacted TTS reporting pt to be under review and requesting a copy of pt's COVID, chest x-ray, EKG and recent head CT results.   TTS faxed over COVID, chest x-ray, and CT results. Pt current RN agreed to fax EKG results.

## 2020-01-14 NOTE — ED Notes (Signed)
Patient given snack.  

## 2020-01-14 NOTE — ED Notes (Signed)
Hourly rounding reveals patient in room. No complaints, stable, in no acute distress. Q15 minute rounds and monitoring via Security Cameras to continue. 

## 2020-01-14 NOTE — BH Assessment (Signed)
Patient has been accepted to [facility].  Patient assigned to: Room 426B Accepting physician is Dr. Alonna Minium.  Call report to [phone] Representative was [place].    ER Staff is aware of it:  Luann ER Secretary  Ellender Hose, ER MD  Anderson Malta Patient's Nurse     Patient's Family/Support System (Daughter Tomas de Castro), [phone]) have been updated as well.   PATIENT IS SCHEDULED FOR ADMISSION ANYTIME AFTER 7am TOMORROW

## 2020-01-14 NOTE — ED Notes (Signed)
Pt to nurses station to throw away trash from breakfast tray, pt requested and received water and voices no other needs or concerns at this time

## 2020-01-14 NOTE — ED Notes (Signed)
IVC accepted to [facility] 6/21 after 0700

## 2020-01-14 NOTE — ED Notes (Signed)
Report to include Situation, Background, Assessment, and Recommendations received from RN. Patient alert and oriented, warm and dry, in no acute distress. Patient denies SI, HI, AVH and pain. Patient made aware of Q15 minute rounds and security cameras for their safety. Patient instructed to come to me with needs or concerns.  

## 2020-01-14 NOTE — ED Notes (Signed)
Pt's sister on unit it visit.  Pt calm and cooperative at current time.

## 2020-01-14 NOTE — ED Notes (Signed)
Pt provided with clean behavorial clothing as well as toiletries, pt provided with instructions on how to use the shower and instructed to bring everything back to nurses station when she is done, pt verbalizes understanding.

## 2020-01-14 NOTE — Progress Notes (Signed)
Pt accepted to [facility]. Gerrit Halls is the accepting provider.  Call report to [phone]  Pt is IVC  Pt may be transported by [facility] Pt scheduled to arrive on Monday 01/15/20 after [place] MSW, [place] Worker Disposition  [facility] Ph: [phone] Fax: [phone]  01/14/2020 11:59 AM

## 2020-01-14 NOTE — Progress Notes (Signed)
[facility] MD Progress Note  01/14/2020 11:58 AM Audrey Duran  MRN:  [id number] Subjective:    I got better sleep   Principal Problem: <principal problem not specified> Psychosis that is relatively new onset --disrupting her entire home life and ADL's  Diagnosis: Active Problems:   * No active hospital problems. 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 Anemia   . Atrial fibrillation ([place])   . COPD (chronic obstructive pulmonary disease) ([place])   . Coronary artery disease 1995   s/p AMI , no history of stents,  [name]  . Hyperlipidemia   . Ischemic cardiomyopathy Dec 2011   ETT Sestamibi study apical scar, no ischemia, [name]  . Myocardial infarction ([place])   . Recurrent HSV (herpes simplex virus)     Past Surgical History:  Procedure Laterality Date  . ABDOMINAL HYSTERECTOMY  june 2014  . BREAST EXCISIONAL BIOPSY Left 1990's   neg  . 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 CATARACT EXTRACTION  2004, 2006  . JOINT REPLACEMENT  July 2013   total hip , [place] Heck   Family History:  Family History  Problem Relation Age of Onset  . Cancer Mother 26       Pancreatic   . Heart disease Father   . Cancer Sister 61       Breast Cancer  . Breast cancer Sister 57   Family Psychiatric  History: none significant now  Social History:  Social History   Substance and Sexual Activity  Alcohol Use No  . Alcohol/week: 0.0 standard drinks     Social History   Substance and Sexual Activity  Drug Use No    Social History   Socioeconomic History  . Marital status: Divorced    Spouse name: Not on file  . Number of children: Not on file  . Years of education: Not on file  . Highest education level: Not on file  Occupational History  . Not  on file  Tobacco Use  . Smoking status: Former Smoker    Quit date: 01/13/1992    Years since quitting: 28.0  . Smokeless tobacco: Never Used  Substance and Sexual Activity  . Alcohol use: No    Alcohol/week: 0.0 standard drinks  . Drug use: No  . Sexual activity: Not Currently  Other Topics Concern  . Not on file  Social History Narrative  . Not on file   Social Determinants of Health   Financial Resource Strain:   . Difficulty of Paying Living Expenses:   Food Insecurity:   . Worried About Charity fundraiser in the Last Year:   . Arboriculturist in the Last Year:   Transportation Needs:   . Film/video editor (Medical):   Marland Kitchen Lack of Transportation (Non-Medical):   Physical Activity:   . Days of Exercise per Week:   . Minutes of Exercise per Session:   Stress:   . Feeling of Stress :   Social Connections:   . Frequency of Communication with Friends and Family:   . Frequency of Social Gatherings with Friends and Family:   . Attends Religious Services:   . Active Member of Clubs or Organizations:   . Attends Club or  Organization Meetings:   Marland Kitchen Marital Status:    Additional Social History:    Pain Medications: See PTA Prescriptions: See PTA Over the Counter: See PTA History of alcohol / drug use?: No history of alcohol / drug abuse Longest period of sobriety (when/how long): n/a Negative Consequences of Use: Personal relationships                    Sleep: Improving ---with meds   Appetite:  Fair   Current Medications: Current Facility-Administered Medications  Medication Dose Route Frequency Provider Last Rate Last Admin  . acetaminophen (TYLENOL) tablet 650 mg  650 mg Oral Q6H PRN Paulette Blanch, MD   650 mg at 01/14/20 0108  . acyclovir (ZOVIRAX) 200 MG capsule 400 mg  400 mg Oral Daily Earleen Newport, MD   400 mg at 01/14/20 0948  . ALPRAZolam Duanne Moron) tablet 0.5 mg  0.5 mg Oral QHS PRN Earleen Newport, MD      . aspirin EC tablet 81 mg   81 mg Oral QHS Earleen Newport, MD      . benztropine (COGENTIN) tablet 0.5 mg  0.5 mg Oral QHS Eulas Post, MD   0.5 mg at 01/14/20 0107  . cholecalciferol (VITAMIN D) tablet 3,000 Units  3,000 Units Oral Daily Earleen Newport, MD   3,000 Units at 01/14/20 [phone]  . clonazePAM (KLONOPIN) tablet 0.25 mg  0.25 mg Oral BID Eulas Post, MD      . DULoxetine (CYMBALTA) DR capsule 20 mg  20 mg Oral Daily Eulas Post, MD      . ferrous sulfate tablet 325 mg  325 mg Oral Daily Earleen Newport, MD   325 mg at 01/14/20 0948  . furosemide (LASIX) tablet 10 mg  10 mg Oral Daily Earleen Newport, MD   10 mg at 01/14/20 0949  . haloperidol (HALDOL) tablet 0.25 mg  0.25 mg Oral Q1400 Eulas Post, MD   0.25 mg at 01/13/20 1728  . haloperidol (HALDOL) tablet 0.5 mg  0.5 mg Oral QHS Eulas Post, MD   0.5 mg at 01/14/20 0107  . isosorbide mononitrate (IMDUR) 24 hr tablet 60 mg  60 mg Oral Daily Earleen Newport, MD   60 mg at 01/14/20 0948  . losartan (COZAAR) tablet 25 mg  25 mg Oral Daily Earleen Newport, MD   25 mg at 01/14/20 0948  . magnesium oxide (MAG-OX) tablet 400 mg  400 mg Oral Daily Earleen Newport, MD   400 mg at 01/14/20 0949  . omega-3 acid ethyl esters (LOVAZA) capsule 1 g  1 g Oral Daily Earleen Newport, MD   1 g at 01/14/20 0948  . pravastatin (PRAVACHOL) tablet 80 mg  80 mg Oral q1800 Earleen Newport, MD      . timolol (TIMOPTIC) 0.5 % ophthalmic solution 1 drop  1 drop Both Eyes BID Earleen Newport, MD   1 drop at 01/14/20 0948  . vitamin B-12 (CYANOCOBALAMIN) tablet 500 mcg  500 mcg Oral Daily Earleen Newport, MD   500 mcg at 01/14/20 1540   Current Outpatient Medications  Medication Sig Dispense Refill  . acyclovir (ZOVIRAX) 400 MG tablet Take 1 tablet (400 mg total) by mouth daily. 90 tablet 1  . alendronate (FOSAMAX) 70 MG tablet Take 1 tablet (70 mg total) by mouth every 7 (seven) days. Take with a full glass of  water on an empty stomach. 12 tablet 3  .  ALPRAZolam (XANAX) 0.5 MG tablet TAKE 1 TABLET BY MOUTH AT BEDTIME AS NEEDED FOR SLEEP 30 tablet 1  . aspirin 81 MG tablet Take 81 mg by mouth at bedtime.     . carbonyl iron (FEOSOL) 45 MG TABS Take one by mouth daily    . Cholecalciferol (VITAMIN D3) 3000 UNITS TABS Take 1 tablet by mouth daily. Take one by mouth daily 90 tablet 3  . cyanocobalamin 1000 MCG tablet Take one by mouth daily     . furosemide (LASIX) 20 MG tablet Take 0.5 tablets (10 mg total) by mouth daily. 45 tablet 1  . isosorbide mononitrate (IMDUR) 60 MG 24 hr tablet Take 1 tablet (60 mg total) by mouth daily. 90 tablet 1  . losartan (COZAAR) 25 MG tablet Take 1 tablet (25 mg total) by mouth daily. 90 tablet 1  . magnesium oxide (MAG-OX) 400 MG tablet Take 400 mg by mouth daily.      . Omega-3 Fatty Acids (FISH OIL) 1200 MG CAPS Take one by mouth daily     . pravastatin (PRAVACHOL) 80 MG tablet Take 1 tablet (80 mg total) by mouth daily. 90 tablet 1  . timolol (TIMOPTIC) 0.5 % ophthalmic solution Place 1 drop into both eyes 2 (two) times daily.       Lab Results:  Results for orders placed or performed during the hospital encounter of 01/13/20 (from the past 48 hour(s))  Comprehensive metabolic panel     Status: Abnormal   Collection Time: 01/13/20  2:06 PM  Result Value Ref Range   Sodium 139 135 - 145 mmol/L   Potassium 4.4 3.5 - 5.1 mmol/L   Chloride 106 98 - 111 mmol/L   CO2 26 22 - 32 mmol/L   Glucose, Bld 135 (H) 70 - 99 mg/dL    Comment: Glucose reference range applies only to samples taken after fasting for at least 8 hours.   BUN 19 8 - 23 mg/dL   Creatinine, Ser 1.05 (H) 0.44 - 1.00 mg/dL   Calcium 9.6 8.9 - 10.3 mg/dL   Total Protein 7.1 6.5 - 8.1 g/dL   Albumin 3.8 3.5 - 5.0 g/dL   AST 20 15 - 41 U/L   ALT 18 0 - 44 U/L   Alkaline Phosphatase 51 38 - 126 U/L   Total Bilirubin 1.0 0.3 - 1.2 mg/dL   GFR calc non Af Amer 49 (L) >60 mL/min   GFR calc Af Amer 57  (L) >60 mL/min   Anion gap 7 5 - 15    Comment: Performed at [facility], [address], [place], [place] [address]  Ethanol     Status: None   Collection Time: 01/13/20  2:06 PM  Result Value Ref Range   Alcohol, Ethyl (B) <10 <10 mg/dL    Comment: (NOTE) Lowest detectable limit for serum alcohol is 10 mg/dL.  For medical purposes only. Performed at [facility], [place], [place] [address]   Salicylate level     Status: Abnormal   Collection Time: 01/13/20  2:06 PM  Result Value Ref Range   Salicylate Lvl <2.4 (L) 7.0 - 30.0 mg/dL    Comment: Performed at [facility], [address] [place], [place] [address]  Acetaminophen level     Status: Abnormal   Collection Time: 01/13/20  2:06 PM  Result Value Ref Range   Acetaminophen (Tylenol), Serum <10 (L) 10 - 30 ug/mL    Comment: (NOTE) Therapeutic  concentrations vary significantly. A range of 10-30 ug/mL  may be an effective concentration for many patients. However, some  are best treated at concentrations outside of this range. Acetaminophen concentrations >150 ug/mL at 4 hours after ingestion  and >50 ug/mL at 12 hours after ingestion are often associated with  toxic reactions.  Performed at [facility], [address] [place], [place] [address]   cbc     Status: None   Collection Time: 01/13/20  2:06 PM  Result Value Ref Range   WBC 7.3 4.0 - 10.5 K/uL   RBC 4.31 3.87 - 5.11 MIL/uL   Hemoglobin 13.1 12.0 - 15.0 g/dL   HCT 39.4 36 - 46 %   MCV 91.4 80.0 - 100.0 fL   MCH 30.4 26.0 - 34.0 pg   MCHC 33.2 30.0 - 36.0 g/dL   RDW 12.7 11.5 - 15.5 %   Platelets 190 150 - 400 K/uL   nRBC 0.0 0.0 - 0.2 %    Comment: Performed at [facility], [address] [place], [place] [address]  Urine Drug Screen, Qualitative     Status: Abnormal   Collection Time: 01/13/20  2:06 PM  Result Value Ref Range   Tricyclic, Ur Screen NONE DETECTED NONE DETECTED    Amphetamines, Ur Screen NONE DETECTED NONE DETECTED   MDMA (Ecstasy)Ur Screen NONE DETECTED NONE DETECTED   Cocaine Metabolite,Ur [place] NONE DETECTED NONE DETECTED   Opiate, Ur Screen NONE DETECTED NONE DETECTED   Phencyclidine (PCP) Ur S NONE DETECTED NONE DETECTED   Cannabinoid 50 Ng, Ur [place] NONE DETECTED NONE DETECTED   Barbiturates, Ur Screen NONE DETECTED NONE DETECTED   Benzodiazepine, Ur Scrn POSITIVE (A) NONE DETECTED   Methadone Scn, Ur NONE DETECTED NONE DETECTED    Comment: (NOTE) Tricyclics + metabolites, urine    Cutoff 1000 ng/mL Amphetamines + metabolites, urine  Cutoff 1000 ng/mL MDMA (Ecstasy), urine              Cutoff 500 ng/mL Cocaine Metabolite, urine          Cutoff 300 ng/mL Opiate + metabolites, urine        Cutoff 300 ng/mL Phencyclidine (PCP), urine         Cutoff 25 ng/mL Cannabinoid, urine                 Cutoff 50 ng/mL Barbiturates + metabolites, urine  Cutoff 200 ng/mL Benzodiazepine, urine              Cutoff 200 ng/mL Methadone, urine                   Cutoff 300 ng/mL  The urine drug screen provides only a preliminary, unconfirmed analytical test result and should not be used for non-medical purposes. Clinical consideration and professional judgment should be applied to any positive drug screen result due to possible interfering substances. A more specific alternate chemical method must be used in order to obtain a confirmed analytical result. Gas chromatography / mass spectrometry (GC/MS) is the preferred confirm atory method. Performed at [facility], [address] [place], [place] [address]   Urinalysis, Complete w Microscopic     Status: Abnormal   Collection Time: 01/13/20  2:06 PM  Result Value Ref Range   Color, Urine YELLOW (A) YELLOW   APPearance CLEAR (A) CLEAR   Specific Gravity, Urine 1.009 1.005 - 1.030   pH 6.0 5.0 - 8.0   Glucose, UA NEGATIVE NEGATIVE mg/dL  Hgb urine dipstick NEGATIVE NEGATIVE   Bilirubin Urine  NEGATIVE NEGATIVE   Ketones, ur NEGATIVE NEGATIVE mg/dL   Protein, ur NEGATIVE NEGATIVE mg/dL   Nitrite NEGATIVE NEGATIVE   Leukocytes,Ua SMALL (A) NEGATIVE   RBC / HPF 0-5 0 - 5 RBC/hpf   WBC, UA 11-20 0 - 5 WBC/hpf   Bacteria, UA NONE SEEN NONE SEEN   Squamous Epithelial / LPF NONE SEEN 0 - 5    Comment: Performed at [facility], [address], [place], [place] [address]  SARS Coronavirus 2 by RT PCR (hospital order, performed in [facility] hospital lab) Nasopharyngeal Nasopharyngeal Swab     Status: None   Collection Time: 01/13/20  4:32 PM   Specimen: Nasopharyngeal Swab  Result Value Ref Range   SARS Coronavirus 2 NEGATIVE NEGATIVE    Comment: (NOTE) SARS-CoV-2 target nucleic acids are NOT DETECTED.  The SARS-CoV-2 RNA is generally detectable in upper and lower respiratory specimens during the acute phase of infection. The lowest concentration of SARS-CoV-2 viral copies this assay can detect is 250 copies / mL. A negative result does not preclude SARS-CoV-2 infection and should not be used as the sole basis for treatment or other patient management decisions.  A negative result may occur with improper specimen collection / handling, submission of specimen other than nasopharyngeal swab, presence of viral mutation(s) within the areas targeted by this assay, and inadequate number of viral copies (<250 copies / mL). A negative result must be combined with clinical observations, patient history, and epidemiological information.  Fact Sheet for Patients:   StrictlyIdeas.no  Fact Sheet for Healthcare Providers: BankingDealers.co.za  This test is not yet approved or  cleared by the [place] FDA and has been authorized for detection and/or diagnosis of SARS-CoV-2 by FDA under an Emergency Use Authorization (EUA).  This EUA will remain in effect (meaning this test can be used) for the duration of the COVID-19  declaration under Section 564(b)(1) of the Act, 21 U.S.C. section 360bbb-3(b)(1), unless the authorization is terminated or revoked sooner.  Performed at [facility], [address] [place], [place] [address]     Blood Alcohol level:  Lab Results  Component Value Date   [facility] <10 54/65/6812    Metabolic Disorder Labs: Lab Results  Component Value Date   HGBA1C 5.8 08/21/2019   No results found for: PROLACTIN Lab Results  Component Value Date   CHOL 159 10/06/2018   TRIG 82.0 10/06/2018   HDL 51.10 10/06/2018   CHOLHDL 3 10/06/2018   VLDL 16.4 10/06/2018   LDLCALC 91 10/06/2018   LDLCALC 79 02/24/2018    Physical Findings: AIMS:  , ,  negative ,  ,    CIWA:    COWS:     Musculoskeletal: Strength & Muscle Tone: normal  Gait & Station: normal  Patient leans: n/a   Psychiatric Specialty Exam: Physical Exam  Review of Systems  Blood pressure (!) 119/46, pulse 62, temperature 98.5 F (36.9 C), temperature source Oral, resp. rate 18, height 5\' 3"  (1.6 m), weight 55.8 kg, SpO2 95 %.Body mass index is 21.79 kg/m.    Mental Status Exam Brief   Alert cooperative oriented to person place date and time  Thin caucasian female more pleasant --today  Somewhat less anxious / less psychotic but still has elaborate delusions somewhat decreasing Mood and affect slightly anxious  Rapport improving  Memory remote recent immediate generally intact but has some errors that need more testing  No active  SI HI or plans but can clinically deteriorate if not hospitalized and kept on IVC  Speech --normal rate tone volume Rapport okay  No movements problems Thought process and content already described yesterday Fund or knowledge intelligence possibly diminishing  Consciousness not clouded or fluctuant SI and HI contracts for safety   Concentration and attention ---normal   Judgement insight reliability  Fair to poor  Abstraction somewhat  concrete                                                                    EKG and Chest Xray pending with possible transfer to [place] Summary:  As above awaits transfer to [place] and klonopin low dose started for anxiety and mood, with regular haldol small dosing while waiting   Eulas Post, MD 01/14/2020, 11:58 AM

## 2020-01-14 NOTE — ED Notes (Signed)
Pt co headache. MD informed and new orders received.

## 2020-01-15 DIAGNOSIS — I10 Essential (primary) hypertension: Secondary | ICD-10-CM | POA: Diagnosis not present

## 2020-01-15 DIAGNOSIS — F419 Anxiety disorder, unspecified: Secondary | ICD-10-CM | POA: Diagnosis not present

## 2020-01-15 DIAGNOSIS — D649 Anemia, unspecified: Secondary | ICD-10-CM | POA: Diagnosis not present

## 2020-01-15 DIAGNOSIS — I252 Old myocardial infarction: Secondary | ICD-10-CM | POA: Diagnosis not present

## 2020-01-15 DIAGNOSIS — F29 Unspecified psychosis not due to a substance or known physiological condition: Secondary | ICD-10-CM | POA: Insufficient documentation

## 2020-01-15 DIAGNOSIS — N1831 Chronic kidney disease, stage 3a: Secondary | ICD-10-CM | POA: Diagnosis not present

## 2020-01-15 DIAGNOSIS — I129 Hypertensive chronic kidney disease with stage 1 through stage 4 chronic kidney disease, or unspecified chronic kidney disease: Secondary | ICD-10-CM | POA: Diagnosis not present

## 2020-01-15 DIAGNOSIS — E538 Deficiency of other specified B group vitamins: Secondary | ICD-10-CM | POA: Diagnosis not present

## 2020-01-15 DIAGNOSIS — J449 Chronic obstructive pulmonary disease, unspecified: Secondary | ICD-10-CM | POA: Diagnosis not present

## 2020-01-15 DIAGNOSIS — F28 Other psychotic disorder not due to a substance or known physiological condition: Secondary | ICD-10-CM | POA: Diagnosis not present

## 2020-01-15 DIAGNOSIS — Z888 Allergy status to other drugs, medicaments and biological substances status: Secondary | ICD-10-CM | POA: Diagnosis not present

## 2020-01-15 DIAGNOSIS — I2510 Atherosclerotic heart disease of native coronary artery without angina pectoris: Secondary | ICD-10-CM | POA: Diagnosis not present

## 2020-01-15 DIAGNOSIS — Z79899 Other long term (current) drug therapy: Secondary | ICD-10-CM | POA: Diagnosis not present

## 2020-01-15 DIAGNOSIS — F333 Major depressive disorder, recurrent, severe with psychotic symptoms: Secondary | ICD-10-CM | POA: Diagnosis not present

## 2020-01-15 DIAGNOSIS — I959 Hypotension, unspecified: Secondary | ICD-10-CM | POA: Diagnosis not present

## 2020-01-15 DIAGNOSIS — E559 Vitamin D deficiency, unspecified: Secondary | ICD-10-CM | POA: Diagnosis not present

## 2020-01-15 DIAGNOSIS — Z880 Allergy status to penicillin: Secondary | ICD-10-CM | POA: Diagnosis not present

## 2020-01-15 DIAGNOSIS — Z87891 Personal history of nicotine dependence: Secondary | ICD-10-CM | POA: Diagnosis not present

## 2020-01-15 DIAGNOSIS — I480 Paroxysmal atrial fibrillation: Secondary | ICD-10-CM | POA: Diagnosis not present

## 2020-01-15 DIAGNOSIS — E7849 Other hyperlipidemia: Secondary | ICD-10-CM | POA: Diagnosis not present

## 2020-01-15 DIAGNOSIS — I255 Ischemic cardiomyopathy: Secondary | ICD-10-CM | POA: Diagnosis not present

## 2020-01-15 LAB — URINE CULTURE: Culture: <10000 — AB

## 2020-01-15 NOTE — ED Notes (Signed)
Hourly rounding reveals patient in room. No complaints, stable, in no acute distress. Q15 minute rounds and monitoring via Security Cameras to continue. 

## 2020-01-15 NOTE — ED Notes (Signed)
[facility]ER[facility]  DEPT  CALLED  FOR  TRANSPORT  TO  [facility]

## 2020-01-15 NOTE — BH Assessment (Signed)
TTS attempted to reach out to Darletta Moll regarding pt's acceptance to both [facility] but received no response. TTS was contacted by [employer] [facility]) regarding pt's transportation plan and after confirming with Lattie Haw (ED Network engineer) pt's current plan to be transported to [place], TTS informed Amber of pt no longer needing placement.   Pt is currently scheduled to be admitted to [facility] today.

## 2020-01-15 NOTE — ED Provider Notes (Signed)
Emergency Medicine Observation Re-evaluation Note  Audrey Duran is a 85 y.o. female, seen on rounds today.  Pt initially presented to the ED for complaints of Psychiatric Evaluation Currently, the patient is eating food in bed. Just states she does not like leaving [place] but denies any other concerns.   Physical Exam  BP 133/63 (BP Location: Right Arm)   Pulse 67   Temp 98.3 F (36.8 C) (Oral)   Resp 20   Ht 5\' 3"  (1.6 m)   Wt 55.8 kg   SpO2 95%   BMI 21.79 kg/m  Physical Exam Physical Exam General: No apparent distress HEENT: moist mucous membranes CV: RRR Pulm: Normal WOB GI: soft and non tender MSK: no edema or cyanosis Neuro: face symmetric, moving all extremities    ED Course / MDM  EKG:    I have reviewed the labs performed to date as well as medications administered while in observation.  Recent changes in the last 24 hours include no new labs. Home meds ordered   Plan  Current plan is for placement. Transfer today?  Patient is under full IVC at this time.   Vanessa Fords, MD 01/15/20 1007

## 2020-01-27 NOTE — ED Provider Notes (Signed)
[facility] Emergency Department Provider Note   ____________________________________________   First MD Initiated Contact with Patient 01/13/20 1516     (approximate)  I have reviewed the triage vital signs and the nursing notes.   HISTORY  Chief Complaint Psychiatric Evaluation    HPI Audrey Duran is a 85 y.o. female here for evaluation under involuntary commitment.  Patient tells me that she is seeing members of "occult" will be doing things such as walking on her deck or climbing on her roof.  Occasionally looking in her windows.  She will go outside and try to get them away.  She is upset but does not want to hurt her daughter.  Denies desire to harm herself.  She reports that she is very concerned about the call trying to get into her home.  She lives alone.    Past Medical History:  Diagnosis Date  . Anemia   . Atrial fibrillation ([place])   . COPD (chronic obstructive pulmonary disease) ([place])   . Coronary artery disease 1995   s/p AMI , no history of stents,  [name]  . Hyperlipidemia   . Ischemic cardiomyopathy Dec 2011   ETT Sestamibi study apical scar, no ischemia, [name]  . Myocardial infarction ([place])   . Recurrent HSV (herpes simplex virus)     Patient Active Problem List   Diagnosis Date Noted  . Takes iron supplements 08/11/2019  . Acute midline thoracic back pain 08/11/2019  . Unintentional weight loss of more than 10 pounds 04/16/2019  . Episodes of formed visual hallucinations 10/12/2018  . Abnormal mammogram of right breast 04/08/2018  . Impaired fasting glucose 08/29/2017  . Prediabetes 05/21/2016  . History of vertigo 11/16/2015  . Hospital discharge follow-up 04/19/2015  . History of Clostridium difficile colitis 12/14/2014  . Long-term use of high-risk medication 05/08/2014  . S/P TAH-BSO (total abdominal hysterectomy and bilateral salpingo-oophorectomy) 10/26/2013  . Vitamin D deficiency 10/12/2013  .  Abnormal stress test 11/18/2012  . CKD (chronic kidney disease) stage 3, GFR 30-59 ml/min 10/19/2012  . Eosinophilia 09/21/2012  . Anxiety state 09/20/2012  . Osteopenia of the elderly 06/20/2012  . At high risk for falls 06/20/2012  . Encounter for preventive health examination 04/25/2012  . Atrophic vaginitis 01/17/2012  . Paroxysmal atrial fibrillation (HCC)   . Recurrent HSV (herpes simplex virus)   . COPD (chronic obstructive pulmonary disease) ([place])   . Ischemic cardiomyopathy   . Cystocele or rectocele with complete uterine prolapse 08/02/2011  . Screening for breast cancer 07/31/2011  . Screening for cervical cancer 07/31/2011  . Screening for colon cancer 07/31/2011  . Coronary artery disease   . History of myocardial infarction   . Hyperlipidemia LDL goal <70     Past Surgical History:  Procedure Laterality Date  . ABDOMINAL HYSTERECTOMY  june 2014  . BREAST EXCISIONAL BIOPSY Left 1990's   neg  . 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 CATARACT EXTRACTION  2004, 2006  . JOINT REPLACEMENT  July 2013   total hip , Sabra Heck    Prior to Admission medications   Medication Sig Start Date End Date Taking? Authorizing Provider  acyclovir (ZOVIRAX) 400 MG tablet Take 1 tablet (400 mg total) by mouth daily. 01/10/20  Yes Crecencio Mc, MD  alendronate (FOSAMAX) 70 MG tablet Take 1 tablet (70 mg total) by mouth every 7 (seven) days. Take with a full glass of water on an empty stomach. 12/12/19  Yes Crecencio Mc, MD  ALPRAZolam Duanne Moron) 0.5 MG tablet TAKE 1 TABLET BY MOUTH AT BEDTIME AS NEEDED FOR SLEEP 12/19/19  Yes Crecencio Mc, MD  aspirin 81 MG tablet Take 81 mg by mouth at bedtime.    Yes [provider]  carbonyl iron (FEOSOL) 45 MG TABS Take one by mouth daily   Yes [provider]  Cholecalciferol (VITAMIN D3) 3000 UNITS TABS Take 1 tablet by mouth daily. Take one by mouth daily 07/31/11  Yes Crecencio Mc, MD  cyanocobalamin 1000  MCG tablet Take one by mouth daily    Yes [provider]  furosemide (LASIX) 20 MG tablet Take 0.5 tablets (10 mg total) by mouth daily. 12/06/19  Yes Crecencio Mc, MD  isosorbide mononitrate (IMDUR) 60 MG 24 hr tablet Take 1 tablet (60 mg total) by mouth daily. 08/18/19  Yes Crecencio Mc, MD  losartan (COZAAR) 25 MG tablet Take 1 tablet (25 mg total) by mouth daily. 08/18/19  Yes Crecencio Mc, MD  magnesium oxide (MAG-OX) 400 MG tablet Take 400 mg by mouth daily.     Yes [provider]  Omega-3 Fatty Acids (FISH OIL) 1200 MG CAPS Take one by mouth daily    Yes [provider]  pravastatin (PRAVACHOL) 80 MG tablet Take 1 tablet (80 mg total) by mouth daily. 08/18/19  Yes Crecencio Mc, MD  timolol (TIMOPTIC) 0.5 % ophthalmic solution Place 1 drop into both eyes 2 (two) times daily.  01/03/19  Yes [provider]    Allergies Azithromycin, Clarithromycin, Diclofenac, Lisinopril, Meloxicam, Nitrofurantoin monohyd macro, Zocor [simvastatin], and Penicillins  Family History  Problem Relation Age of Onset  . Cancer Mother 74       Pancreatic   . Heart disease Father   . Cancer Sister 24       Breast Cancer  . Breast cancer Sister 58    Social History Social History   Tobacco Use  . Smoking status: Former Smoker    Quit date: 01/13/1992    Years since quitting: 28.0  . Smokeless tobacco: Never Used  Substance Use Topics  . Alcohol use: No    Alcohol/week: 0.0 standard drinks  . Drug use: No    Review of Systems Constitutional: No fever/chills or recent illness Eyes: No visual changes. Denies hallucinations, but at the same time tells me that she sees "members walking about sometimes on a roof and on her deck. Cardiovascular: Denies chest pain. Respiratory: Denies shortness of breath. Gastrointestinal: No abdominal pain.  Normally Genitourinary: Negative for dysuria. Neurological: Negative for  headaches    ____________________________________________   PHYSICAL EXAM:  VITAL SIGNS: ED Triage Vitals  Enc Vitals Group     BP 01/13/20 1359 138/73     Pulse Rate 01/13/20 1359 63     Resp 01/13/20 1359 16     Temp 01/13/20 1359 98.5 F (36.9 C)     Temp Source 01/13/20 1359 Oral     SpO2 01/13/20 1359 95 %     Weight 01/13/20 1358 123 lb (55.8 kg)     Height 01/13/20 1358 5\' 3"  (1.6 m)     Head Circumference --      Peak Flow --      Pain Score 01/13/20 1358 0     Pain Loc --      Pain Edu? --      Excl. in [place]? --     Constitutional: Alert and oriented.  Well appearing and in no acute distress.  Compliant with care requests. Eyes: Conjunctivae are normal. Head: Atraumatic. Nose: No congestion/rhinnorhea. Mouth/Throat: Mucous membranes are moist. Neck: No stridor.  Cardiovascular: Normal rate, regular rhythm. Grossly normal heart sounds.  Good peripheral circulation. Respiratory: Normal respiratory effort.  No retractions. Lungs CTAB.  Occasional dry cough, patient reports that is chronic as this "smoker's cough" Gastrointestinal: Soft and nontender. No distention. Musculoskeletal: No lower extremity tenderness nor edema. Neurologic:  Normal speech and language. No gross focal neurologic deficits are appreciated.  Skin:  Skin is warm, dry and intact. No rash noted. Psychiatric: Mood and affect are somewhat elevated, somewhat anxious.  Sometimes conversation seems quite normal but then when asked directly about the cold, she will start to tell me about members of the [place] who are doing things such as peering in her windows, looking around on her deck, walking on her roof.  She seems to have significant hallucination or delusion regarding members of the [place] trying to invade her life or her home. ____________________________________________   LABS (all labs ordered are listed, but only abnormal results are displayed)  Labs Reviewed  URINE CULTURE - Abnormal; Notable for  the following components:      Result Value   Culture   (*)    Value: <10,000 COLONIES/mL INSIGNIFICANT GROWTH Performed at [facility], [address] [place], [place] [address]    All other components within normal limits  COMPREHENSIVE METABOLIC PANEL - Abnormal; Notable for the following components:   Glucose, Bld 135 (*)    Creatinine, Ser 1.05 (*)    GFR calc non Af Amer 49 (*)    GFR calc Af Amer 57 (*)    All other components within normal limits  SALICYLATE LEVEL - Abnormal; Notable for the following components:   Salicylate Lvl <0.6 (*)    All other components within normal limits  ACETAMINOPHEN LEVEL - Abnormal; Notable for the following components:   Acetaminophen (Tylenol), Serum <10 (*)    All other components within normal limits  URINE DRUG SCREEN, QUALITATIVE ([facility] ONLY) - Abnormal; Notable for the following components:   Benzodiazepine, Ur Scrn POSITIVE (*)    All other components within normal limits  URINALYSIS, COMPLETE (UACMP) WITH MICROSCOPIC - Abnormal; Notable for the following components:   Color, Urine YELLOW (*)    APPearance CLEAR (*)    Leukocytes,Ua SMALL (*)    All other components within normal limits  SARS CORONAVIRUS 2 BY RT PCR (HOSPITAL ORDER, [facility])  ETHANOL  CBC   ____________________________________________  EKG  ED ECG REPORT I, Delman Kitten, the attending physician, personally viewed and interpreted this ECG.  EKG Time: 1238 Rate: 60 Rhythm: normal sinus rhythm QRS Axis: normal Intervals: normal ST/T Wave abnormalities: normal Narrative Interpretation: no evidence of acute ischemia  ____________________________________________  RADIOLOGY Head CT and chest x-ray negative for acute reviewed by me  ____________________________________________   PROCEDURES  Procedure(s) performed: None  Procedures  Critical Care performed:  No  ____________________________________________   INITIAL IMPRESSION / ASSESSMENT AND PLAN / ED COURSE  Pertinent labs & imaging results that were available during my care of the patient were reviewed by me and considered in my medical decision making (see chart for details).   Lab work, imaging including chest x-ray reassuring.  Patient disposition per psychiatry team, felt medically clear for psychiatric disposition    Audrey Duran was evaluated in Emergency Department on 01/27/2020 for the  symptoms described in the history of present illness. She was evaluated in the context of the global COVID-19 pandemic, which necessitated consideration that the patient might be at risk for infection with the SARS-CoV-2 virus that causes COVID-19. Institutional protocols and algorithms that pertain to the evaluation of patients at risk for COVID-19 are in a state of rapid change based on information released by regulatory bodies including the CDC and federal and state organizations. These policies and algorithms were followed during the patient's care in the ED.   ____________________________________________   FINAL CLINICAL IMPRESSION(S) / ED DIAGNOSES  Final diagnoses:  Psychosis in elderly [facility])  Major depression, recurrent, chronic ([place])        Note:  This document was prepared using Dragon voice recognition software and may include unintentional dictation errors       Delman Kitten, MD 01/27/20 1641

## 2020-01-30 ENCOUNTER — Telehealth: Payer: Self-pay | Admitting: Internal Medicine

## 2020-01-30 DIAGNOSIS — F22 Delusional disorders: Secondary | ICD-10-CM | POA: Insufficient documentation

## 2020-01-30 MED ORDER — FUROSEMIDE 20 MG PO TABS: 10.0000 mg | ORAL_TABLET | Freq: Every day | ORAL | 1 refills | Status: DC

## 2020-01-30 NOTE — Telephone Encounter (Signed)
Pt called in need refill on furosemide (LASIX) 20 MG tablet

## 2020-01-30 NOTE — Telephone Encounter (Signed)
Furosemide refilled.  Needs appt in July for hospital follow up [facility] admission , haldol started)

## 2020-01-30 NOTE — Telephone Encounter (Signed)
Pt wanted to know if she needed to come in because he feet are swollen but she is not taking her medication furosemide (LASIX) 20 MG tablet

## 2020-01-30 NOTE — Telephone Encounter (Signed)
Should pt come in for an appt or she she start back on her medication to see if the swelling goes down first?

## 2020-01-30 NOTE — Assessment & Plan Note (Signed)
Admitted to Behavioral Med in June after ER visit . Haldol started and clonazepam stopped

## 2020-01-31 NOTE — Telephone Encounter (Signed)
Spoke with pt she is scheduled for an appt with Dawson Bills, NP tomorrow for swollen feet and then scheduled on Monday at 11:30 for a hospital follow up with Dr. Derrel Nip.

## 2020-02-01 ENCOUNTER — Ambulatory Visit (INDEPENDENT_AMBULATORY_CARE_PROVIDER_SITE_OTHER): Payer: PPO | Admitting: Nurse Practitioner

## 2020-02-01 ENCOUNTER — Encounter: Payer: Self-pay | Admitting: Nurse Practitioner

## 2020-02-01 ENCOUNTER — Other Ambulatory Visit: Payer: Self-pay

## 2020-02-01 VITALS — BP 140/60 | HR 84 | Temp 97.9°F | Ht 63.0 in | Wt 134.0 lb

## 2020-02-01 DIAGNOSIS — I255 Ischemic cardiomyopathy: Secondary | ICD-10-CM | POA: Diagnosis not present

## 2020-02-01 DIAGNOSIS — I480 Paroxysmal atrial fibrillation: Secondary | ICD-10-CM | POA: Diagnosis not present

## 2020-02-01 DIAGNOSIS — N183 Chronic kidney disease, stage 3 unspecified: Secondary | ICD-10-CM | POA: Diagnosis not present

## 2020-02-01 DIAGNOSIS — R600 Localized edema: Secondary | ICD-10-CM | POA: Diagnosis not present

## 2020-02-01 LAB — BASIC METABOLIC PANEL
BUN: 27 mg/dL — ABNORMAL HIGH (ref 6–23)
CO2: 28 mEq/L (ref 19–32)
Calcium: 9.7 mg/dL (ref 8.4–10.5)
Chloride: 102 mEq/L (ref 96–112)
Creatinine, Ser: 1 mg/dL (ref 0.40–1.20)
GFR: 53 mL/min — ABNORMAL LOW (ref >60.00)
Glucose, Bld: 113 mg/dL — ABNORMAL HIGH (ref 70–99)
Potassium: 3.8 mEq/L (ref 3.5–5.1)
Sodium: 137 mEq/L (ref 135–145)

## 2020-02-01 NOTE — Progress Notes (Addendum)
Established Patient Office Visit  Subjective:  Patient ID: Audrey Duran, female    DOB: May 28, 1935  Age: 85 y.o. MRN: [id number]  CC:  Chief Complaint  Patient presents with  . Acute Visit    swollen feet    HPI Audrey Duran is an 85 yo presents for new onset feet swelling started a few days ago. She woke up on Tues and both feet were swollen-especially at the toes. No redness or joint pain. No calf pain or swelling.  Over the last few days, the swelling has decreased. She has been elevating her legs. No compression hose. She reports she has had this in the past and elevated them and they got better. She is on Cozaar and Lasix 10 mg daily.  She has noted no chest pain, shortness of breath, and can lay flat in bed without difficulty.  She has had no cough or wheezing. Her weight is elevated.    She was recently hospitalized 01/15/2020 through 01/29/2020 with involuntary commitment to behavioral medicine for psychosis with delusional behavior.  A chest x-ray was obtained at that time and was normal.  She was started on duloxetine 20 mg daily for major depressive disorder.  She is also on Risperdal 2 mg  twice daily.  She has Xanax available to use 0.5 mg at bedtime if needed. Her furosemide was decreased to 10 mg every other day and may increase to daily if she gets a weight gain of 2 pounds in 24 hours or 5 pounds in 5 days.  Wt Readings from Last 3 Encounters:  02/01/20 134 lb (60.8 kg)  01/13/20 123 lb (55.8 kg)  08/11/19 127 lb (57.6 kg)    Past Medical History:  Diagnosis Date  . Anemia   . Atrial fibrillation ([place])   . COPD (chronic obstructive pulmonary disease) ([place])   . Coronary artery disease 1995   s/p AMI , no history of stents,  [name]  . Hyperlipidemia   . Ischemic cardiomyopathy Dec 2011   ETT Sestamibi study apical scar, no ischemia, [name]  . Myocardial infarction ([place])   . Recurrent HSV (herpes simplex virus)     Past Surgical History:  Procedure  Laterality Date  . ABDOMINAL HYSTERECTOMY  june 2014  . BREAST EXCISIONAL BIOPSY Left 1990's   neg  . 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 CATARACT EXTRACTION  2004, 2006  . JOINT REPLACEMENT  July 2013   total hip , Sabra Heck    Family History  Problem Relation Age of Onset  . Cancer Mother 65       Pancreatic   . Heart disease Father   . Cancer Sister 59       Breast Cancer  . Breast cancer Sister 26    Social History   Socioeconomic History  . Marital status: Divorced    Spouse name: Not on file  . Number of children: Not on file  . Years of education: Not on file  . Highest education level: Not on file  Occupational History  . Not on file  Tobacco Use  . Smoking status: Former Smoker    Quit date: 01/13/1992    Years since quitting: 28.0  . Smokeless tobacco: Never Used  Substance and Sexual Activity  . Alcohol use: No    Alcohol/week: 0.0 standard drinks  . Drug use: No  . Sexual activity: Not Currently  Other Topics Concern  . Not on file  Social History  Narrative  . Not on file   Social Determinants of Health   Financial Resource Strain:   . Difficulty of Paying Living Expenses:   Food Insecurity:   . Worried About Charity fundraiser in the Last Year:   . Arboriculturist in the Last Year:   Transportation Needs:   . Film/video editor (Medical):   Marland Kitchen Lack of Transportation (Non-Medical):   Physical Activity:   . Days of Exercise per Week:   . Minutes of Exercise per Session:   Stress:   . Feeling of Stress :   Social Connections:   . Frequency of Communication with Friends and Family:   . Frequency of Social Gatherings with Friends and Family:   . Attends Religious Services:   . Active Member of Clubs or Organizations:   . Attends Archivist Meetings:   Marland Kitchen Marital Status:   Intimate Partner Violence:   . Fear of Current or Ex-Partner:   . Emotionally Abused:   Marland Kitchen Physically Abused:   . Sexually Abused:      Outpatient Medications Prior to Visit  Medication Sig Dispense Refill  . acyclovir (ZOVIRAX) 400 MG tablet Take 1 tablet (400 mg total) by mouth daily. 90 tablet 1  . alendronate (FOSAMAX) 70 MG tablet Take 1 tablet (70 mg total) by mouth every 7 (seven) days. Take with a full glass of water on an empty stomach. 12 tablet 3  . ALPRAZolam (XANAX) 0.5 MG tablet TAKE 1 TABLET BY MOUTH AT BEDTIME AS NEEDED FOR SLEEP 30 tablet 1  . aspirin 81 MG tablet Take 81 mg by mouth at bedtime.     . carbonyl iron (FEOSOL) 45 MG TABS Take one by mouth daily    . Cholecalciferol (VITAMIN D3) 3000 UNITS TABS Take 1 tablet by mouth daily. Take one by mouth daily 90 tablet 3  . cyanocobalamin 1000 MCG tablet Take one by mouth daily     . DULoxetine (CYMBALTA) 20 MG capsule Take by mouth.    . furosemide (LASIX) 20 MG tablet Take 0.5 tablets (10 mg total) by mouth daily. 45 tablet 1  . isosorbide mononitrate (IMDUR) 60 MG 24 hr tablet Take 1 tablet (60 mg total) by mouth daily. 90 tablet 1  . losartan (COZAAR) 25 MG tablet Take 1 tablet (25 mg total) by mouth daily. 90 tablet 1  . magnesium oxide (MAG-OX) 400 MG tablet Take 400 mg by mouth daily.      . Omega-3 Fatty Acids (FISH OIL) 1200 MG CAPS Take one by mouth daily     . pravastatin (PRAVACHOL) 80 MG tablet Take 1 tablet (80 mg total) by mouth daily. 90 tablet 1  . risperiDONE (RISPERDAL) 2 MG tablet Take by mouth.    . timolol (TIMOPTIC) 0.5 % ophthalmic solution Place 1 drop into both eyes 2 (two) times daily.     . meclizine (ANTIVERT) 50 MG tablet Take by mouth.     No facility-administered medications prior to visit.    Allergies  Allergen Reactions  . Azithromycin     GI upset  . Clarithromycin     ? Rash  . Diclofenac     ? Rash  . Lisinopril Other (See Comments)    hyperkalemia  . Meloxicam     ? Rash  . Nitrofurantoin Monohyd Macro     ? rash  . Zocor [Simvastatin]   . Penicillins Rash    ROS Review of Systems   Constitutional:  Negative for chills and fever.  HENT: Negative for congestion and sinus pressure.   Eyes: Negative.   Respiratory: Negative for cough, chest tightness and shortness of breath.   Cardiovascular: Negative for chest pain and palpitations.       Ankle swelling- less today  Gastrointestinal: Negative.   Endocrine: Negative.   Genitourinary: Negative for difficulty urinating.  Musculoskeletal: Positive for back pain.       Low back pain- intermittent Jan- takes Tylenol and ibuprofen with relief.   Neurological: Negative for dizziness and light-headedness.  Hematological: Negative.   Psychiatric/Behavioral:       Denies depression and anxiety. No SI or HI.       Objective:    Physical Exam Vitals reviewed.  Constitutional:      Appearance: Normal appearance. She is normal weight.  HENT:     Head: Normocephalic and atraumatic.  Eyes:     Pupils: Pupils are equal, round, and reactive to light.  Cardiovascular:     Rate and Rhythm: Normal rate and regular rhythm.     Pulses: Normal pulses.     Heart sounds: Normal heart sounds. No murmur heard.   Pulmonary:     Breath sounds: Normal breath sounds. No rales.  Abdominal:     Palpations: Abdomen is soft.     Tenderness: There is no abdominal tenderness.  Musculoskeletal:        General: Normal range of motion.     Cervical back: Normal range of motion and neck supple.     Right lower leg: No edema.     Left lower leg: No edema.     Comments: Only ankle and pedal non pitting. Slight edema right foot slightly > than left. Calf -no tenderness or swelling.   Skin:    General: Skin is warm and dry.  Neurological:     General: No focal deficit present.     Mental Status: She is alert and oriented to person, place, and time.  Psychiatric:        Mood and Affect: Mood normal.        Behavior: Behavior normal.        Thought Content: Thought content normal.        Judgment: Judgment normal.     BP 140/60 (BP  Location: Left Arm, Patient Position: Sitting, Cuff Size: Normal)   Pulse 84   Temp 97.9 F (36.6 C) (Oral)   Ht 5\' 3"  (1.6 m)   Wt 134 lb (60.8 kg)   SpO2 98%   BMI 23.74 kg/m  Wt Readings from Last 3 Encounters:  02/01/20 134 lb (60.8 kg)  01/13/20 123 lb (55.8 kg)  08/11/19 127 lb (57.6 kg)   CLINICAL DATA:   EXAM: CHEST - 2 VIEW  COMPARISON:  June 25, 2016  FINDINGS: The heart, hila, mediastinum, lungs, and pleura are normal. No acute abnormalities are identified.  IMPRESSION: No active cardiopulmonary disease.   Electronically Signed   By: Dorise Bullion III M.D   On: 01/14/2020 13:51   Lab Results  Component Value Date   TSH 1.79 08/21/2019   Lab Results  Component Value Date   WBC 7.3 01/13/2020   HGB 13.1 01/13/2020   HCT 39.4 01/13/2020   MCV 91.4 01/13/2020   PLT 190 01/13/2020   Lab Results  Component Value Date   NA 137 02/01/2020   K 3.8 02/01/2020   CO2 28 02/01/2020   GLUCOSE 113 (H) 02/01/2020   BUN 27 (H)  02/01/2020   CREATININE 1.00 02/01/2020   BILITOT 1.0 01/13/2020   ALKPHOS 51 01/13/2020   AST 20 01/13/2020   ALT 18 01/13/2020   PROT 7.1 01/13/2020   ALBUMIN 3.8 01/13/2020   CALCIUM 9.7 02/01/2020   ANIONGAP 7 01/13/2020   GFR 53.00 (L) 02/01/2020   Lab Results  Component Value Date   CHOL 159 10/06/2018   Lab Results  Component Value Date   HDL 51.10 10/06/2018   Lab Results  Component Value Date   LDLCALC 91 10/06/2018   Lab Results  Component Value Date   TRIG 82.0 10/06/2018   Lab Results  Component Value Date   CHOLHDL 3 10/06/2018   Lab Results  Component Value Date   HGBA1C 5.8 08/21/2019      Assessment & Plan:   Problem List Items Addressed This Visit      Cardiovascular and Mediastinum   Paroxysmal atrial fibrillation (HCC)   Ischemic cardiomyopathy     Genitourinary   CKD (chronic kidney disease) stage 3, GFR 30-59 ml/min     Other   Lower extremity edema - Primary    Relevant Orders   Basic metabolic panel (Completed)   DOPPLER VENOUS LEGS BILATERAL     Her hospital admission and discharge note reviewed from 01/15/2020.  She did have a decrease in her Lasix dose according to discharge papers.  Patient tells me she is taking Lasix 10 mg daily as usual.  We will check electrolytes today with hx of CKD and make further recommendations.  She does have scant edema in her ankles and dorsal feet. She reports it has improved with leg elevation. I considered a vascular ultrasound as well.  Her lungs are CTA, she has  no shortness of breath, and her chest x-ray recently was normal.  I do not see evidence of congestive heart failure with the exam today.  Her blood pressure is 140/80, pulse ox room air is 98%. Weight gain noted.   Patient was advised: I have ordered a Doppler ultrasound to check the circulation to lower extremities.  You will be called with appointment.  Elevate your legs when sitting down. Eat a lower salt diet.   Monitor for foot swelling.   Further recommendations pending lab results.   Follow up in 2 weeks with Dr. Derrel Nip.   02/06/2020: Addendum: The doppler study has not yet been obtained. A new order will need to be placed if still necessary. I will check with Dr. Derrel Nip who examined the patient yesterday.     No orders of the defined types were placed in this encounter.   Follow-up: Return in about 2 weeks (around 02/15/2020).   This visit occurred during the SARS-CoV-2 public health emergency.  Safety protocols were in place, including screening questions prior to the visit, additional usage of staff PPE, and extensive cleaning of exam room while observing appropriate contact time as indicated for disinfecting solutions.   Denice Paradise, NP

## 2020-02-01 NOTE — Patient Instructions (Addendum)
I have ordered lab today.   I have ordered a Doppler ultrasound to check the circulation to lower extremities.  You will be called with appointment.  Elevate your legs when sitting down. Eat a lower salt diet.   Monitor for foot swelling.   Further recommendations pending lab results.   Follow up in 2 weeks with Dr. Derrel Nip.  Edema  Edema is an abnormal buildup of fluids in the body tissues and under the skin. Swelling of the legs, feet, and ankles is a common symptom that becomes more likely as you get older. Swelling is also common in looser tissues, like around the eyes. When the affected area is squeezed, the fluid may move out of that spot and leave a dent for a few moments. This dent is called pitting edema. There are many possible causes of edema. Eating too much salt (sodium) and being on your feet or sitting for a long time can cause edema in your legs, feet, and ankles. Hot weather may make edema worse. Common causes of edema include:  Heart failure.  Liver or kidney disease.  Weak leg blood vessels.  Cancer.  An injury.  Pregnancy.  Medicines.  Being obese.  Low protein levels in the blood. Edema is usually painless. Your skin may look swollen or shiny. Follow these instructions at home:  Keep the affected body part raised (elevated) above the level of your heart when you are sitting or lying down.  Do not sit still or stand for long periods of time.  Do not wear tight clothing. Do not wear garters on your upper legs.  Exercise your legs to get your circulation going. This helps to move the fluid back into your blood vessels, and it may help the swelling go down.  Wear elastic bandages or support stockings to reduce swelling as told by your health care provider.  Eat a low-salt (low-sodium) diet to reduce fluid as told by your health care provider.  Depending on the cause of your swelling, you may need to limit how much fluid you drink (fluid  restriction).  Take over-the-counter and prescription medicines only as told by your health care provider. Contact a health care provider if:  Your edema does not get better with treatment.  You have heart, liver, or kidney disease and have symptoms of edema.  You have sudden and unexplained weight gain. Get help right away if:  You develop shortness of breath or chest pain.  You cannot breathe when you lie down.  You develop pain, redness, or warmth in the swollen areas.  You have heart, liver, or kidney disease and suddenly get edema.  You have a fever and your symptoms suddenly get worse. Summary  Edema is an abnormal buildup of fluids in the body tissues and under the skin.  Eating too much salt (sodium) and being on your feet or sitting for a long time can cause edema in your legs, feet, and ankles.  Keep the affected body part raised (elevated) above the level of your heart when you are sitting or lying down. This information is not intended to replace advice given to you by your health care provider. Make sure you discuss any questions you have with your health care provider. Document Revised: 11/30/2018 Document Reviewed: 08/15/2016 Elsevier Patient Education  [place].

## 2020-02-04 ENCOUNTER — Encounter: Payer: Self-pay | Admitting: Nurse Practitioner

## 2020-02-05 ENCOUNTER — Encounter: Payer: Self-pay | Admitting: Internal Medicine

## 2020-02-05 ENCOUNTER — Ambulatory Visit (INDEPENDENT_AMBULATORY_CARE_PROVIDER_SITE_OTHER): Payer: PPO | Admitting: Internal Medicine

## 2020-02-05 ENCOUNTER — Other Ambulatory Visit: Payer: Self-pay

## 2020-02-05 VITALS — BP 130/76 | HR 64 | Temp 97.5°F | Ht 63.0 in | Wt 131.1 lb

## 2020-02-05 DIAGNOSIS — M8580 Other specified disorders of bone density and structure, unspecified site: Secondary | ICD-10-CM

## 2020-02-05 DIAGNOSIS — F22 Delusional disorders: Secondary | ICD-10-CM

## 2020-02-05 DIAGNOSIS — Z8619 Personal history of other infectious and parasitic diseases: Secondary | ICD-10-CM | POA: Insufficient documentation

## 2020-02-05 DIAGNOSIS — Z09 Encounter for follow-up examination after completed treatment for conditions other than malignant neoplasm: Secondary | ICD-10-CM

## 2020-02-05 NOTE — Assessment & Plan Note (Signed)
Continue acyclovir °

## 2020-02-05 NOTE — Progress Notes (Signed)
Subjective:  Patient ID: Audrey Duran, female    DOB: 02-10-1935  Age: 85 y.o. MRN: [id number]  CC: The primary encounter diagnosis was Paranoid delusion (Audrey Duran). Diagnoses of History of herpes labialis, Osteopenia of the elderly, and Hospital discharge follow-up were also pertinent to this visit.  HPI Audrey Duran presents for hospital follow up.    [name]'s visit occurred during the SARS-CoV-2 public health emergency.  Safety protocols were in place, including screening questions prior to the visit, additional usage of staff PPE, and extensive cleaning of exam room while observing appropriate contact time as indicated for disinfecting solutions.    She was involuntarily committed to [facility] by her daughter [name] on  01/15/2020 and remained  on the [facility] through 01/29/2020 .  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  Placement was intended,  But she did not meet criteria and was discharged to home .  She states that Her daughter [name] called the police and had her taken away.   She states that her daughter took picture of the incident but did not visit her in the hospital.   Patient is distraught over the incident.  She is tearful today because she has been embarrased.   A chest x-ray was obtained at that time and was normal.  She was started on duloxetine 20 mg daily for major depressive disorder.  She was also prescribed Risperdal 2 mg  twice daily.  using xanax to help her sleep and sleeping well (medication pre dates hospitalization.  She was discharged on July 6. To home .  She has been having a hard time with new onset balance issues since going home from hospital .scared of falling  New medications risperdone and cymbalta reviewed with patient.   She was evaluated by NP last week for management of LE edema which started during hospitalization.  Was seen on July 8 and sent for DVT rule out due to asymmetry.  No DVT found, , taking lasix 10 mg  daily .  Down 3 lbs from last week, but notes that her feet are still very swollen and she cannot fit into her shoes.  Denies dyspnea , orthopnea, but feels "swollen all over."   She does not want to see a psychiatrist "I'm [employer] and we don't believe in psychiatrists" but is willing to see a therapist    Putting up security cameras today because she continues to believe that certain people in her neighborhood are stalking her and invading her privacy.   Had psych follow up on Saturday with [facility]  Liked the counselor,  Refuses to see a psychiatrist.      Outpatient Medications Prior to Visit  Medication Sig Dispense Refill  . acyclovir (ZOVIRAX) 400 MG tablet Take 1 tablet (400 mg total) by mouth daily. 90 tablet 1  . alendronate (FOSAMAX) 70 MG tablet Take 1 tablet (70 mg total) by mouth every 7 (seven) days. Take with a full glass of water on an empty stomach. 12 tablet 3  . ALPRAZolam (XANAX) 0.5 MG tablet TAKE 1 TABLET BY MOUTH AT BEDTIME AS NEEDED FOR SLEEP 30 tablet 1  . aspirin 81 MG tablet Take 81 mg by mouth at bedtime.     . carbonyl iron (FEOSOL) 45 MG TABS Take one by mouth daily    . Cholecalciferol (VITAMIN D3) 3000 UNITS TABS Take 1 tablet by mouth daily. Take one by mouth daily 90 tablet 3  . cyanocobalamin 1000 MCG tablet  Take one by mouth daily     . DULoxetine (CYMBALTA) 20 MG capsule Take by mouth.    . furosemide (LASIX) 20 MG tablet Take 0.5 tablets (10 mg total) by mouth daily. 45 tablet 1  . isosorbide mononitrate (IMDUR) 60 MG 24 hr tablet Take 1 tablet (60 mg total) by mouth daily. 90 tablet 1  . losartan (COZAAR) 25 MG tablet Take 1 tablet (25 mg total) by mouth daily. 90 tablet 1  . magnesium oxide (MAG-OX) 400 MG tablet Take 400 mg by mouth daily.      . meclizine (ANTIVERT) 50 MG tablet Take by mouth.    . Omega-3 Fatty Acids (FISH OIL) 1200 MG CAPS Take one by mouth daily     . pravastatin (PRAVACHOL) 80 MG tablet Take 1 tablet (80 mg total) by mouth daily. 90  tablet 1  . risperiDONE (RISPERDAL) 2 MG tablet Take by mouth.    . timolol (TIMOPTIC) 0.5 % ophthalmic solution Place 1 drop into both eyes 2 (two) times daily.      No facility-administered medications prior to visit.    Review of Systems;  Patient denies headache, fevers, malaise, unintentional weight loss, skin rash, eye pain, sinus congestion and sinus pain, sore throat, dysphagia,  hemoptysis , cough, dyspnea, wheezing, chest pain, palpitations, orthopnea, edema, abdominal pain, nausea, melena, diarrhea, constipation, flank pain, dysuria, hematuria, urinary  Frequency, nocturia, numbness, tingling, seizures,  Focal weakness, Loss of consciousness,  Tremor, insomnia, depression, anxiety, and suicidal ideation.      Objective:  BP 130/76   Pulse 64   Temp (!) 97.5 F (36.4 C) (Oral)   Ht 5\' 3"  (1.6 m)   Wt 131 lb 1.9 oz (59.5 kg)   SpO2 97%   BMI 23.23 kg/m   BP Readings from Last 3 Encounters:  02/05/20 130/76  02/01/20 140/60  01/15/20 (!) 141/81    Wt Readings from Last 3 Encounters:  02/05/20 131 lb 1.9 oz (59.5 kg)  02/01/20 134 lb (60.8 kg)  01/13/20 123 lb (55.8 kg)    General appearance: alert, cooperative and appears stated age Ears: normal TM's and external ear canals both ears Throat: lips, mucosa, and tongue normal; teeth and gums normal Neck: no adenopathy, no carotid bruit, supple, symmetrical, trachea midline and thyroid not enlarged, symmetric, no tenderness/mass/nodules Back: symmetric, no curvature. ROM normal. No CVA tenderness. Lungs: clear to auscultation bilaterally Heart: regular rate and rhythm, S1, S2 normal, no murmur, click, rub or gallop Abdomen: soft, non-tender; bowel sounds normal; no masses,  no organomegaly Pulses: 2+ and symmetric Skin: Skin color, texture, turgor normal. No rashes or lesions Lymph nodes: Cervical, supraclavicular, and axillary nodes normal.  Lab Results  Component Value Date   HGBA1C 5.8 08/21/2019   HGBA1C 5.9  04/13/2019   HGBA1C 5.8 10/06/2018    Lab Results  Component Value Date   CREATININE 1.00 02/01/2020   CREATININE 1.05 (H) 01/13/2020   CREATININE 1.00 08/21/2019    Lab Results  Component Value Date   WBC 7.3 01/13/2020   HGB 13.1 01/13/2020   HCT 39.4 01/13/2020   PLT 190 01/13/2020   GLUCOSE 113 (H) 02/01/2020   CHOL 159 10/06/2018   TRIG 82.0 10/06/2018   HDL 51.10 10/06/2018   LDLCALC 91 10/06/2018   ALT 18 01/13/2020   AST 20 01/13/2020   NA 137 02/01/2020   K 3.8 02/01/2020   CL 102 02/01/2020   CREATININE 1.00 02/01/2020   BUN 27 (H) 02/01/2020  CO2 28 02/01/2020   TSH 1.79 08/21/2019   INR 1.0 02/16/2012   HGBA1C 5.8 08/21/2019    DG Chest 2 View  Result Date: 01/14/2020 CLINICAL DATA:  Transfer 2 seconds I tree. History of atrial fibrillation. EXAM: CHEST - 2 VIEW COMPARISON:  June 25, 2016 FINDINGS: The heart, hila, mediastinum, lungs, and pleura are normal. No acute abnormalities are identified. IMPRESSION: No active cardiopulmonary disease. Electronically Signed   By: Dorise Bullion III M.D   On: 01/14/2020 13:51   CT Head Wo Contrast  Result Date: 01/13/2020 CLINICAL DATA:  Altered mental status. EXAM: CT HEAD WITHOUT CONTRAST TECHNIQUE: Contiguous axial images were obtained from the base of the skull through the vertex without intravenous contrast. COMPARISON:  April 13, 2015 FINDINGS: Brain: There is mild cerebral atrophy with widening of the extra-axial spaces and ventricular dilatation. There are areas of decreased attenuation within the white matter tracts of the supratentorial brain, consistent with microvascular disease changes. Vascular: No hyperdense vessel or unexpected calcification. Skull: Normal. Negative for fracture or focal lesion. Sinuses/Orbits: No acute finding. Other: None. IMPRESSION: 1. Generalized cerebral atrophy. 2. No acute intracranial abnormality. Electronically Signed   By: Virgina Norfolk M.D.   On: 01/13/2020 16:11     Assessment & Plan:   Problem List Items Addressed This Visit      Unprioritized   Osteopenia of the elderly    With prior hip fracture.  Tolerating weekly alendronate , renal function stable.   Lab Results  Component Value Date   CREATININE 1.00 02/01/2020         Hospital discharge follow-up    Patient is stable post discharge and has persistenet lower extremity edema which was addressed with an increased dose of furosemide  to 20 mg daily for 5 more days.  She has been referred to psychology, refuses to see psychiatry.  cymbalta suspended due to dizziness.  Continue risperdal,  Will add an antidepressant at follow up if tolerating risperdal without persistent dizziness .      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  She was not endorsing feelings of homice or suicide and was not considered a danger to others.  The delusions are regarding her neighbors, who she believe are in a cult and  Are sneaking  Into her trailer through the [place] and underground tunnels and trying to get her evicted. She is not tolerating cymbalta and risperdal.  Will d/c cymbalta and continue risperdal .  Referral to Miguel Dibble ,  (SW) because she refuses to see psychiatry .  She will likely require placement if she develops agitation or is unable to care for herself at home.         Relevant Orders   Ambulatory referral to Psychology   History of herpes labialis    Continue acyclovir         I am having Uchechi L. Wieser maintain her aspirin, carbonyl iron, magnesium oxide, Fish Oil, cyanocobalamin, Vitamin D3, timolol, losartan, isosorbide mononitrate, pravastatin, alendronate, ALPRAZolam, acyclovir, furosemide, DULoxetine, risperiDONE, and meclizine.  No orders of the defined types were placed in this encounter.   There are no discontinued medications.  Follow-up: Return in about 1 week (around  02/12/2020).   Crecencio Mc, MD

## 2020-02-05 NOTE — Patient Instructions (Addendum)
Increase your furosemide to 20 mg daily for ONE WEEK only,  Then resume 10 mg daily  dose Elevate your legs whenever possible  Start wearing compression knee highs  Every day when you are home    CONTINUE ACYCLOVIR TO PREVENT HERPES OUTBREAKS  I RECOMMEND CONTINUING THE RISPERDONE AND ALPRAZOLAM,  BUT YOU CAN SUSPEND  the duloxetine (cymbalta)  Continue taking the risperdone  2 times daily   I RECOMMEND SEEING TINA THOMPSON AS YOUR THERAPIST  YOU CAN REACH HER AT [phone]  YOU ARE OK TO DRIVE AS LONG AS YOU ARE NOT EXPERIENCING DIZZY SYMPTOMS WHILE SEATED

## 2020-02-06 NOTE — Assessment & Plan Note (Addendum)
She was involuntarily committed by her daughter for treatment of paranoid delusions.  She was not endorsing feelings of homice or suicide and was not considered a danger to others.  The delusions are regarding her neighbors, who she believe are in a cult and  Are sneaking  Into her trailer through the [place] and underground tunnels and trying to get her evicted. She is not tolerating cymbalta and risperdal.  Will d/c cymbalta and continue risperdal .  Referral to Miguel Dibble ,  (SW) because she refuses to see psychiatry .  She will likely require placement if she develops agitation or is unable to care for herself at home.

## 2020-02-06 NOTE — Assessment & Plan Note (Signed)
With prior hip fracture.  Tolerating weekly alendronate , renal function stable.   Lab Results  Component Value Date   CREATININE 1.00 02/01/2020

## 2020-02-06 NOTE — Assessment & Plan Note (Signed)
Patient is stable post discharge and has persistenet lower extremity edema which was addressed with an increased dose of furosemide  to 20 mg daily for 5 more days.  She has been referred to psychology, refuses to see psychiatry.  cymbalta suspended due to dizziness.  Continue risperdal,  Will add an antidepressant at follow up if tolerating risperdal without persistent dizziness .

## 2020-02-13 ENCOUNTER — Encounter: Payer: Self-pay | Admitting: Internal Medicine

## 2020-02-13 ENCOUNTER — Ambulatory Visit (INDEPENDENT_AMBULATORY_CARE_PROVIDER_SITE_OTHER): Payer: PPO | Admitting: Internal Medicine

## 2020-02-13 ENCOUNTER — Other Ambulatory Visit: Payer: Self-pay

## 2020-02-13 DIAGNOSIS — R600 Localized edema: Secondary | ICD-10-CM

## 2020-02-13 DIAGNOSIS — F22 Delusional disorders: Secondary | ICD-10-CM | POA: Diagnosis not present

## 2020-02-13 MED ORDER — RISPERIDONE 1 MG PO TABS: 1.0000 mg | ORAL_TABLET | Freq: Every day | ORAL | 2 refills | Status: DC

## 2020-02-13 NOTE — Assessment & Plan Note (Signed)
Improved with low-dose furosemide 

## 2020-02-13 NOTE — Assessment & Plan Note (Signed)
She is willing to resume risperidone based on my advice today .  Starting dose 1 mg daily she refuses to see a psychiatrist and prefers to wait for an appointment with Audrey Duran.

## 2020-02-13 NOTE — Patient Instructions (Addendum)
I want you to start taking risperidone 1 mg daily right after dinner . This medicine will help you relax and worry less about Audrey Duran and the people in your park that are bothering you.  You can still take your  Alprazolam at bedtime if needed   Continue taking the 1/2 fluid pill daily

## 2020-02-13 NOTE — Progress Notes (Signed)
Subjective:  Patient ID: Audrey Duran, female    DOB: Oct 18, 1934  Age: 86 y.o. MRN: [id number]  CC: Diagnoses of Paranoid delusion ([place]) and Lower extremity edema were pertinent to this visit.  HPI [name] presents for one week follow up on 1) lower extremity edema and 2) paranoid delusional disorder.   This visit occurred during the SARS-CoV-2 public health emergency.  Safety protocols were in place, including screening questions prior to the visit, additional usage of staff PPE, and extensive cleaning of exam room while observing appropriate contact time as indicated for disinfecting solutions.   Patient was involuntarily committed to [facility] by her daughter in June for paranoid delusional psychosis and was released after several days on cymbalta and risperidone with and psych follow up. She did not tolerate the medications and is no longer taking them.  She has been unable to secure an appt with Miguel Dibble yet.   She remains paranoid and  delusional about a certain man that is listening to her telephone conversations and another who is getting under [place] but now appears to be fixated on her daughter's unhappiness  as well.  She is calm and well dressed .   Lower extremity swelling has improved and is now limited to the feet.  Taking furosemide 10 mg daily .  She denies any history of calf pain.   [facility]  Back is  feeling better   Outpatient Medications Prior to Visit  Medication Sig Dispense Refill  . acyclovir (ZOVIRAX) 400 MG tablet Take 1 tablet (400 mg total) by mouth daily. 90 tablet 1  . alendronate (FOSAMAX) 70 MG tablet Take 1 tablet (70 mg total) by mouth every 7 (seven) days. Take with a full glass of water on an empty stomach. 12 tablet 3  . ALPRAZolam (XANAX) 0.5 MG tablet TAKE 1 TABLET BY MOUTH AT BEDTIME AS NEEDED FOR SLEEP 30 tablet 1  . aspirin 81 MG tablet Take 81 mg by mouth at  bedtime.     . carbonyl iron (FEOSOL) 45 MG TABS Take one by mouth daily    . Cholecalciferol (VITAMIN D3) 3000 UNITS TABS Take 1 tablet by mouth daily. Take one by mouth daily 90 tablet 3  . cyanocobalamin 1000 MCG tablet Take one by mouth daily     . DULoxetine (CYMBALTA) 20 MG capsule Take by mouth.    . furosemide (LASIX) 20 MG tablet Take 0.5 tablets (10 mg total) by mouth daily. 45 tablet 1  . isosorbide mononitrate (IMDUR) 60 MG 24 hr tablet Take 1 tablet (60 mg total) by mouth daily. 90 tablet 1  . losartan (COZAAR) 25 MG tablet Take 1 tablet (25 mg total) by mouth daily. 90 tablet 1  . magnesium oxide (MAG-OX) 400 MG tablet Take 400 mg by mouth daily.      . meclizine (ANTIVERT) 50 MG tablet Take by mouth.    . Omega-3 Fatty Acids (FISH OIL) 1200 MG CAPS Take one by mouth daily     . pravastatin (PRAVACHOL) 80 MG tablet Take 1 tablet (80 mg total) by mouth daily. 90 tablet 1  . timolol (TIMOPTIC) 0.5 % ophthalmic solution Place 1 drop into both eyes 2 (two) times daily.     . risperiDONE (RISPERDAL) 2 MG tablet Take by mouth.     No facility-administered medications prior to visit.    Review of Systems;  Patient denies headache, fevers, malaise, unintentional weight loss, skin rash, eye pain, sinus congestion and sinus pain, sore throat, dysphagia,  hemoptysis , cough, dyspnea, wheezing, chest pain, palpitations, orthopnea, edema, abdominal pain, nausea, melena, diarrhea, constipation, flank pain, dysuria, hematuria, urinary  Frequency, nocturia, numbness, tingling, seizures,  Focal weakness, Loss of consciousness,  Tremor, insomnia, depression, anxiety, and suicidal ideation.      Objective:  BP 136/74 (BP Location: Left Arm, Patient Position: Sitting, Cuff Size: Normal)   Pulse 62   Temp 98.2 F (36.8 C) (Oral)   Resp 15   Ht 5\' 3"  (1.6 m)   Wt 128 lb 12.8 oz (58.4 kg)   SpO2 96%   BMI 22.82 kg/m   BP Readings from Last 3 Encounters:  02/13/20 136/74  02/05/20 130/76    02/01/20 140/60    Wt Readings from Last 3 Encounters:  02/13/20 128 lb 12.8 oz (58.4 kg)  02/05/20 131 lb 1.9 oz (59.5 kg)  02/01/20 134 lb (60.8 kg)    General appearance: alert, cooperative and appears stated age Ears: normal TM's and external ear canals both ears Throat: lips, mucosa, and tongue normal; teeth and gums normal Neck: no adenopathy, no carotid bruit, supple, symmetrical, trachea midline and thyroid not enlarged, symmetric, no tenderness/mass/nodules Back: symmetric, no curvature. ROM normal. No CVA tenderness. Lungs: clear to auscultation bilaterally Heart: regular rate and rhythm, S1, S2 normal, no murmur, click, rub or gallop Abdomen: soft, non-tender; bowel sounds normal; no masses,  no organomegaly Pulses: 2+ and symmetric Skin: Skin color, texture, turgor normal. No rashes or lesions Lymph nodes: Cervical, supraclavicular, and axillary nodes normal. Ext:  Non pitting edema limited to feet bilaterally.   Lab Results  Component Value Date   HGBA1C 5.8 08/21/2019   HGBA1C 5.9 04/13/2019   HGBA1C 5.8 10/06/2018    Lab Results  Component Value Date   CREATININE 1.00 02/01/2020   CREATININE 1.05 (H) 01/13/2020   CREATININE 1.00 08/21/2019    Lab Results  Component Value Date   WBC 7.3 01/13/2020   HGB 13.1 01/13/2020   HCT 39.4 01/13/2020   PLT 190 01/13/2020   GLUCOSE 113 (H) 02/01/2020   CHOL 159 10/06/2018   TRIG 82.0 10/06/2018   HDL 51.10 10/06/2018   LDLCALC 91 10/06/2018   ALT 18 01/13/2020   AST 20 01/13/2020   NA 137 02/01/2020   K 3.8 02/01/2020   CL 102 02/01/2020   CREATININE 1.00 02/01/2020   BUN 27 (H) 02/01/2020   CO2 28 02/01/2020   TSH 1.79 08/21/2019   INR 1.0 02/16/2012   HGBA1C 5.8 08/21/2019    DG Chest 2 View  Result Date: 01/14/2020 CLINICAL DATA:  Transfer 2 seconds I tree. History of atrial fibrillation. EXAM: CHEST - 2 VIEW COMPARISON:  June 25, 2016 FINDINGS: The heart, hila, mediastinum, lungs, and pleura  are normal. No acute abnormalities are identified. IMPRESSION: No active cardiopulmonary disease. Electronically Signed   By: Dorise Bullion III M.D   On: 01/14/2020 13:51   CT Head Wo Contrast  Result Date: 01/13/2020 CLINICAL DATA:  Altered mental status. EXAM: CT HEAD WITHOUT CONTRAST TECHNIQUE: Contiguous axial images were obtained from the base of the skull through the vertex without intravenous contrast. COMPARISON:  April 13, 2015 FINDINGS: Brain: There is mild cerebral atrophy with widening of the extra-axial spaces and ventricular dilatation. There are areas of decreased attenuation within the white matter tracts of the supratentorial brain, consistent with microvascular disease changes. Vascular: No hyperdense  vessel or unexpected calcification. Skull: Normal. Negative for fracture or focal lesion. Sinuses/Orbits: No acute finding. Other: None. IMPRESSION: 1. Generalized cerebral atrophy. 2. No acute intracranial abnormality. Electronically Signed   By: Virgina Norfolk M.D.   On: 01/13/2020 16:11    Assessment & Plan:   Problem List Items Addressed This Visit      Unprioritized   Lower extremity edema    Improved with low dose furosemide.        Paranoid delusion ([place])    She is willing to resume risperidone based on my advice today .  Starting dose 1 mg daily she refuses to see a psychiatrist and prefers to wait for an appointment with Miguel Dibble.         I provided  30 minutes of  face-to-face time during this encounter reviewing patient's current problems and past surgeries, labs and imaging studies, providing counseling on the above mentioned problems , and coordination  of care .  I have changed Elanie L. Eoff's risperiDONE. I am also having her maintain her aspirin, carbonyl iron, magnesium oxide, Fish Oil, cyanocobalamin, Vitamin D3, timolol, losartan, isosorbide mononitrate, pravastatin, alendronate, ALPRAZolam, acyclovir, furosemide, DULoxetine, and  meclizine.  Meds ordered this encounter  Medications  . risperiDONE (RISPERDAL) 1 MG tablet    Sig: Take 1 tablet (1 mg total) by mouth at bedtime.    Dispense:  30 tablet    Refill:  2    Medications Discontinued During This Encounter  Medication Reason  . risperiDONE (RISPERDAL) 2 MG tablet     Follow-up: Return in about 2 weeks (around 02/27/2020).   Crecencio Mc, MD

## 2020-02-14 ENCOUNTER — Telehealth: Payer: Self-pay

## 2020-02-14 ENCOUNTER — Telehealth: Payer: Self-pay | Admitting: Internal Medicine

## 2020-02-14 NOTE — Telephone Encounter (Signed)
No I did not tell her to restart the duloxetine.  I want her to start the risperidone ,  At a lower dose,  Which I called ot her phamacu

## 2020-02-14 NOTE — Telephone Encounter (Signed)
Pt has questions about taking DULoxetine (CYMBALTA) 20 MG capsule. Pt is unsure of why it was prescribed to her. She states that Dr. Ronnald Ramp prescribed it and wanted Dr Lupita Dawn opinion. She states that it is too strong and she wants to only take half of it.

## 2020-02-14 NOTE — Telephone Encounter (Signed)
Caller is wanting to know if the physician is wanting her to take the duloxetine 20 mg. She feels it is to strong for her      [facility] Telephone Record  [facility] Client [facility] Client Site [facility] Physician Deborra Medina - MD Contact Type Call Who Is Calling Patient / Member / Family / Caregiver Caller Name Janith Nielson Caller Phone Number [phone] Patient Name Audrey Duran Patient DOB 04-Jul-1935 Call Type Message Only Information Provided Reason for Call Request for General Office Information Initial Comment Caller is wanting to know if the physician is wanting her to take the duloxetine 20 mg. She feels it is to strong for her. Additional Comment Provided office hours Disp. Time Disposition Final User 02/14/2020 7:36:24 AM General Information Provided Yes Vinnie Level Call Closed By: Vinnie Level Transaction Date/Time: 02/14/2020 7:32:24 AM (ET)

## 2020-02-14 NOTE — Telephone Encounter (Signed)
See prior res[onse

## 2020-02-14 NOTE — Telephone Encounter (Signed)
LMTCB

## 2020-02-14 NOTE — Telephone Encounter (Signed)
See triage message that was sent to [facility].

## 2020-02-15 ENCOUNTER — Other Ambulatory Visit: Payer: Self-pay

## 2020-02-15 MED ORDER — PRAVASTATIN SODIUM 80 MG PO TABS: 80.0000 mg | ORAL_TABLET | Freq: Every day | ORAL | 1 refills | Status: DC

## 2020-02-15 MED ORDER — ISOSORBIDE MONONITRATE ER 60 MG PO TB24: 60.0000 mg | ORAL_TABLET | Freq: Every day | ORAL | 1 refills | Status: DC

## 2020-02-19 ENCOUNTER — Encounter: Payer: Self-pay | Admitting: Emergency Medicine

## 2020-02-19 ENCOUNTER — Other Ambulatory Visit: Payer: Self-pay

## 2020-02-19 DIAGNOSIS — Z90710 Acquired absence of both cervix and uterus: Secondary | ICD-10-CM | POA: Diagnosis not present

## 2020-02-19 DIAGNOSIS — I2510 Atherosclerotic heart disease of native coronary artery without angina pectoris: Secondary | ICD-10-CM | POA: Diagnosis present

## 2020-02-19 DIAGNOSIS — Z7982 Long term (current) use of aspirin: Secondary | ICD-10-CM | POA: Diagnosis not present

## 2020-02-19 DIAGNOSIS — Z20822 Contact with and (suspected) exposure to covid-19: Secondary | ICD-10-CM | POA: Diagnosis present

## 2020-02-19 DIAGNOSIS — R197 Diarrhea, unspecified: Secondary | ICD-10-CM | POA: Insufficient documentation

## 2020-02-19 DIAGNOSIS — R339 Retention of urine, unspecified: Secondary | ICD-10-CM | POA: Diagnosis not present

## 2020-02-19 DIAGNOSIS — Z5321 Procedure and treatment not carried out due to patient leaving prior to being seen by health care provider: Secondary | ICD-10-CM | POA: Insufficient documentation

## 2020-02-19 DIAGNOSIS — J449 Chronic obstructive pulmonary disease, unspecified: Secondary | ICD-10-CM | POA: Diagnosis present

## 2020-02-19 DIAGNOSIS — Z79899 Other long term (current) drug therapy: Secondary | ICD-10-CM | POA: Diagnosis not present

## 2020-02-19 DIAGNOSIS — I480 Paroxysmal atrial fibrillation: Secondary | ICD-10-CM | POA: Diagnosis present

## 2020-02-19 DIAGNOSIS — K353 Acute appendicitis with localized peritonitis, without perforation or gangrene: Secondary | ICD-10-CM | POA: Diagnosis not present

## 2020-02-19 DIAGNOSIS — N179 Acute kidney failure, unspecified: Secondary | ICD-10-CM | POA: Diagnosis not present

## 2020-02-19 DIAGNOSIS — I959 Hypotension, unspecified: Secondary | ICD-10-CM | POA: Diagnosis not present

## 2020-02-19 DIAGNOSIS — N183 Chronic kidney disease, stage 3 unspecified: Secondary | ICD-10-CM | POA: Diagnosis present

## 2020-02-19 DIAGNOSIS — Z7983 Long term (current) use of bisphosphonates: Secondary | ICD-10-CM | POA: Diagnosis not present

## 2020-02-19 DIAGNOSIS — E785 Hyperlipidemia, unspecified: Secondary | ICD-10-CM | POA: Diagnosis present

## 2020-02-19 DIAGNOSIS — I252 Old myocardial infarction: Secondary | ICD-10-CM | POA: Diagnosis not present

## 2020-02-19 DIAGNOSIS — R109 Unspecified abdominal pain: Secondary | ICD-10-CM | POA: Insufficient documentation

## 2020-02-19 DIAGNOSIS — K3580 Unspecified acute appendicitis: Secondary | ICD-10-CM | POA: Diagnosis present

## 2020-02-19 DIAGNOSIS — E876 Hypokalemia: Secondary | ICD-10-CM | POA: Diagnosis not present

## 2020-02-19 DIAGNOSIS — E869 Volume depletion, unspecified: Secondary | ICD-10-CM | POA: Diagnosis not present

## 2020-02-19 DIAGNOSIS — Z8249 Family history of ischemic heart disease and other diseases of the circulatory system: Secondary | ICD-10-CM | POA: Diagnosis not present

## 2020-02-19 DIAGNOSIS — K3532 Acute appendicitis with perforation and localized peritonitis, without abscess: Secondary | ICD-10-CM | POA: Diagnosis present

## 2020-02-19 DIAGNOSIS — N281 Cyst of kidney, acquired: Secondary | ICD-10-CM | POA: Diagnosis not present

## 2020-02-19 DIAGNOSIS — K388 Other specified diseases of appendix: Secondary | ICD-10-CM | POA: Diagnosis not present

## 2020-02-19 DIAGNOSIS — I255 Ischemic cardiomyopathy: Secondary | ICD-10-CM | POA: Diagnosis present

## 2020-02-19 DIAGNOSIS — R112 Nausea with vomiting, unspecified: Secondary | ICD-10-CM | POA: Insufficient documentation

## 2020-02-19 DIAGNOSIS — F411 Generalized anxiety disorder: Secondary | ICD-10-CM | POA: Diagnosis present

## 2020-02-19 LAB — COMPREHENSIVE METABOLIC PANEL
ALT: 13 U/L (ref 0–44)
AST: 19 U/L (ref 15–41)
Albumin: 3.6 g/dL (ref 3.5–5.0)
Alkaline Phosphatase: 48 U/L (ref 38–126)
Anion gap: 9 (ref 5–15)
BUN: 15 mg/dL (ref 8–23)
CO2: 25 mmol/L (ref 22–32)
Calcium: 9.4 mg/dL (ref 8.9–10.3)
Chloride: 103 mmol/L (ref 98–111)
Creatinine, Ser: 1.02 mg/dL — ABNORMAL HIGH (ref 0.44–1.00)
GFR calc Af Amer: 59 mL/min — ABNORMAL LOW (ref >60)
GFR calc non Af Amer: 51 mL/min — ABNORMAL LOW (ref >60)
Glucose, Bld: 154 mg/dL — ABNORMAL HIGH (ref 70–99)
Potassium: 3.7 mmol/L (ref 3.5–5.1)
Sodium: 137 mmol/L (ref 135–145)
Total Bilirubin: 0.8 mg/dL (ref 0.3–1.2)
Total Protein: 6.8 g/dL (ref 6.5–8.1)

## 2020-02-19 LAB — CBC
HCT: 36.9 % (ref 36.0–46.0)
Hemoglobin: 12 g/dL (ref 12.0–15.0)
MCH: 30.9 pg (ref 26.0–34.0)
MCHC: 32.5 g/dL (ref 30.0–36.0)
MCV: 95.1 fL (ref 80.0–100.0)
Platelets: 212 10*3/uL (ref 150–400)
RBC: 3.88 MIL/uL (ref 3.87–5.11)
RDW: 13.1 % (ref 11.5–15.5)
WBC: 14.6 10*3/uL — ABNORMAL HIGH (ref 4.0–10.5)
nRBC: 0 % (ref 0.0–0.2)

## 2020-02-19 LAB — LIPASE, BLOOD: Lipase: 22 U/L (ref 11–51)

## 2020-02-19 MED ORDER — ONDANSETRON 4 MG PO TBDP
4.0000 mg | ORAL_TABLET | Freq: Once | ORAL | Status: AC | PRN
  Administered 2020-02-19: 4 mg via ORAL
  Filled 2020-02-19: qty 1

## 2020-02-19 MED ORDER — SODIUM CHLORIDE 0.9% FLUSH: 3.0000 mL | Freq: Once | INTRAVENOUS | Status: DC

## 2020-02-19 NOTE — ED Triage Notes (Signed)
Pt presents to triage ambulatory with steady gait, pt reports she has and N/V/D since yesterday. Reports today she has had about 5 episodes of emesis. Pt talks in complete sentences no distress noted.

## 2020-02-20 ENCOUNTER — Emergency Department
Admission: EM | Admit: 2020-02-20 | Discharge: 2020-02-20 | Disposition: A | Discharge status: Home / Self Care | Payer: PPO | Source: Home / Self Care

## 2020-02-20 ENCOUNTER — Other Ambulatory Visit: Payer: Self-pay

## 2020-02-20 ENCOUNTER — Emergency Department: Payer: PPO

## 2020-02-20 ENCOUNTER — Observation Stay: Payer: PPO | Admitting: Anesthesiology

## 2020-02-20 ENCOUNTER — Encounter: Payer: Self-pay | Admitting: Emergency Medicine

## 2020-02-20 ENCOUNTER — Inpatient Hospital Stay
Admission: EM | Admit: 2020-02-20 | Discharge: 2020-02-23 | DRG: 339 | Disposition: A | Discharge status: Home Health | Payer: PPO | Attending: Surgery | Admitting: Surgery

## 2020-02-20 ENCOUNTER — Encounter
Admission: EM | Disposition: A | Discharge status: Home / Self Care | Payer: Self-pay | Source: Home / Self Care | Attending: Surgery

## 2020-02-20 DIAGNOSIS — Z7983 Long term (current) use of bisphosphonates: Secondary | ICD-10-CM

## 2020-02-20 DIAGNOSIS — K353 Acute appendicitis with localized peritonitis, without perforation or gangrene: Secondary | ICD-10-CM

## 2020-02-20 DIAGNOSIS — I959 Hypotension, unspecified: Secondary | ICD-10-CM | POA: Diagnosis not present

## 2020-02-20 DIAGNOSIS — N179 Acute kidney failure, unspecified: Secondary | ICD-10-CM | POA: Diagnosis not present

## 2020-02-20 DIAGNOSIS — Z7982 Long term (current) use of aspirin: Secondary | ICD-10-CM

## 2020-02-20 DIAGNOSIS — Z8249 Family history of ischemic heart disease and other diseases of the circulatory system: Secondary | ICD-10-CM

## 2020-02-20 DIAGNOSIS — I2510 Atherosclerotic heart disease of native coronary artery without angina pectoris: Secondary | ICD-10-CM | POA: Diagnosis present

## 2020-02-20 DIAGNOSIS — E869 Volume depletion, unspecified: Secondary | ICD-10-CM | POA: Diagnosis not present

## 2020-02-20 DIAGNOSIS — E785 Hyperlipidemia, unspecified: Secondary | ICD-10-CM | POA: Diagnosis present

## 2020-02-20 DIAGNOSIS — N183 Chronic kidney disease, stage 3 unspecified: Secondary | ICD-10-CM | POA: Diagnosis present

## 2020-02-20 DIAGNOSIS — I480 Paroxysmal atrial fibrillation: Secondary | ICD-10-CM | POA: Diagnosis present

## 2020-02-20 DIAGNOSIS — K3532 Acute appendicitis with perforation and localized peritonitis, without abscess: Principal | ICD-10-CM | POA: Diagnosis present

## 2020-02-20 DIAGNOSIS — Z90710 Acquired absence of both cervix and uterus: Secondary | ICD-10-CM

## 2020-02-20 DIAGNOSIS — Z79899 Other long term (current) drug therapy: Secondary | ICD-10-CM

## 2020-02-20 DIAGNOSIS — F411 Generalized anxiety disorder: Secondary | ICD-10-CM | POA: Diagnosis present

## 2020-02-20 DIAGNOSIS — Z20822 Contact with and (suspected) exposure to covid-19: Secondary | ICD-10-CM | POA: Diagnosis present

## 2020-02-20 DIAGNOSIS — K3580 Unspecified acute appendicitis: Secondary | ICD-10-CM | POA: Diagnosis present

## 2020-02-20 DIAGNOSIS — R339 Retention of urine, unspecified: Secondary | ICD-10-CM | POA: Diagnosis not present

## 2020-02-20 DIAGNOSIS — I255 Ischemic cardiomyopathy: Secondary | ICD-10-CM | POA: Diagnosis present

## 2020-02-20 DIAGNOSIS — I252 Old myocardial infarction: Secondary | ICD-10-CM

## 2020-02-20 DIAGNOSIS — J449 Chronic obstructive pulmonary disease, unspecified: Secondary | ICD-10-CM | POA: Diagnosis present

## 2020-02-20 DIAGNOSIS — E876 Hypokalemia: Secondary | ICD-10-CM | POA: Diagnosis not present

## 2020-02-20 HISTORY — PX: LAPAROSCOPIC APPENDECTOMY: SHX408

## 2020-02-20 LAB — COMPREHENSIVE METABOLIC PANEL
ALT: 12 U/L (ref 0–44)
AST: 15 U/L (ref 15–41)
Albumin: 3.6 g/dL (ref 3.5–5.0)
Alkaline Phosphatase: 45 U/L (ref 38–126)
Anion gap: 11 (ref 5–15)
BUN: 15 mg/dL (ref 8–23)
CO2: 22 mmol/L (ref 22–32)
Calcium: 9.4 mg/dL (ref 8.9–10.3)
Chloride: 104 mmol/L (ref 98–111)
Creatinine, Ser: 0.91 mg/dL (ref 0.44–1.00)
GFR calc Af Amer: >60 mL/min (ref >60)
GFR calc non Af Amer: 59 mL/min — ABNORMAL LOW (ref >60)
Glucose, Bld: 154 mg/dL — ABNORMAL HIGH (ref 70–99)
Potassium: 3.9 mmol/L (ref 3.5–5.1)
Sodium: 137 mmol/L (ref 135–145)
Total Bilirubin: 1.1 mg/dL (ref 0.3–1.2)
Total Protein: 6.6 g/dL (ref 6.5–8.1)

## 2020-02-20 LAB — CBC
HCT: 38.1 % (ref 36.0–46.0)
Hemoglobin: 12.1 g/dL (ref 12.0–15.0)
MCH: 30.5 pg (ref 26.0–34.0)
MCHC: 31.8 g/dL (ref 30.0–36.0)
MCV: 96 fL (ref 80.0–100.0)
Platelets: 208 10*3/uL (ref 150–400)
RBC: 3.97 MIL/uL (ref 3.87–5.11)
RDW: 13.1 % (ref 11.5–15.5)
WBC: 10 10*3/uL (ref 4.0–10.5)
nRBC: 0 % (ref 0.0–0.2)

## 2020-02-20 LAB — LIPASE, BLOOD: Lipase: 21 U/L (ref 11–51)

## 2020-02-20 LAB — SARS CORONAVIRUS 2 BY RT PCR (HOSPITAL ORDER, PERFORMED IN ~~LOC~~ HOSPITAL LAB): SARS Coronavirus 2: NEGATIVE

## 2020-02-20 SURGERY — APPENDECTOMY, LAPAROSCOPIC
Anesthesia: General

## 2020-02-20 MED ORDER — SODIUM CHLORIDE 0.9 % IV SOLN: INTRAVENOUS | Status: DC

## 2020-02-20 MED ORDER — FENTANYL CITRATE (PF) 100 MCG/2ML IJ SOLN: 25.0000 ug | INTRAMUSCULAR | Status: DC | PRN

## 2020-02-20 MED ORDER — ONDANSETRON HCL 4 MG/2ML IJ SOLN
4.0000 mg | Freq: Once | INTRAMUSCULAR | Status: AC
  Administered 2020-02-20: 4 mg via INTRAVENOUS
  Filled 2020-02-20: qty 2

## 2020-02-20 MED ORDER — PIPERACILLIN-TAZOBACTAM 3.375 G IVPB 30 MIN
3.3750 g | Freq: Once | INTRAVENOUS | Status: AC
  Administered 2020-02-20: 3.375 g via INTRAVENOUS
  Filled 2020-02-20: qty 50

## 2020-02-20 MED ORDER — SUGAMMADEX SODIUM 500 MG/5ML IV SOLN
INTRAVENOUS | Status: DC | PRN
  Administered 2020-02-20: 120 mg via INTRAVENOUS

## 2020-02-20 MED ORDER — LACTATED RINGERS IV SOLN: INTRAVENOUS | Status: DC

## 2020-02-20 MED ORDER — LACTATED RINGERS IV SOLN: INTRAVENOUS | Status: DC | PRN

## 2020-02-20 MED ORDER — ONDANSETRON HCL 4 MG/2ML IJ SOLN: 4.0000 mg | Freq: Four times a day (QID) | INTRAMUSCULAR | Status: DC | PRN

## 2020-02-20 MED ORDER — ROCURONIUM BROMIDE 10 MG/ML (PF) SYRINGE
PREFILLED_SYRINGE | INTRAVENOUS | Status: AC
  Filled 2020-02-20: qty 10

## 2020-02-20 MED ORDER — MORPHINE SULFATE (PF) 2 MG/ML IV SOLN: 2.0000 mg | INTRAVENOUS | Status: DC | PRN

## 2020-02-20 MED ORDER — CHLORHEXIDINE GLUCONATE 0.12 % MT SOLN
15.0000 mL | Freq: Once | OROMUCOSAL | Status: AC
  Administered 2020-02-20: 15 mL via OROMUCOSAL

## 2020-02-20 MED ORDER — ACETAMINOPHEN 10 MG/ML IV SOLN
INTRAVENOUS | Status: AC
  Filled 2020-02-20: qty 100

## 2020-02-20 MED ORDER — IOHEXOL 300 MG/ML  SOLN
75.0000 mL | Freq: Once | INTRAMUSCULAR | Status: AC | PRN
  Administered 2020-02-20: 75 mL via INTRAVENOUS

## 2020-02-20 MED ORDER — MORPHINE SULFATE (PF) 4 MG/ML IV SOLN
4.0000 mg | Freq: Once | INTRAVENOUS | Status: AC
  Administered 2020-02-20: 4 mg via INTRAVENOUS
  Filled 2020-02-20: qty 1

## 2020-02-20 MED ORDER — PROPOFOL 10 MG/ML IV BOLUS
INTRAVENOUS | Status: AC
  Filled 2020-02-20: qty 20

## 2020-02-20 MED ORDER — ONDANSETRON 4 MG PO TBDP: 4.0000 mg | ORAL_TABLET | Freq: Four times a day (QID) | ORAL | Status: DC | PRN

## 2020-02-20 MED ORDER — OXYCODONE HCL 5 MG PO TABS: 5.0000 mg | ORAL_TABLET | Freq: Once | ORAL | Status: DC | PRN

## 2020-02-20 MED ORDER — SODIUM CHLORIDE 0.9 % IV SOLN
1000.0000 mL | Freq: Once | INTRAVENOUS | Status: AC
  Administered 2020-02-20: 1000 mL via INTRAVENOUS

## 2020-02-20 MED ORDER — SUCCINYLCHOLINE CHLORIDE 20 MG/ML IJ SOLN
INTRAMUSCULAR | Status: DC | PRN
  Administered 2020-02-20: 80 mg via INTRAVENOUS

## 2020-02-20 MED ORDER — ONDANSETRON HCL 4 MG/2ML IJ SOLN: 4.0000 mg | Freq: Once | INTRAMUSCULAR | Status: DC | PRN

## 2020-02-20 MED ORDER — SODIUM CHLORIDE 0.9 % IV SOLN: INTRAVENOUS | Status: DC | PRN

## 2020-02-20 MED ORDER — BUPIVACAINE-EPINEPHRINE 0.25% -1:200000 IJ SOLN
INTRAMUSCULAR | Status: DC | PRN
  Administered 2020-02-20: 30 mL

## 2020-02-20 MED ORDER — ACETAMINOPHEN 500 MG PO TABS
1000.0000 mg | ORAL_TABLET | Freq: Four times a day (QID) | ORAL | Status: DC
  Administered 2020-02-20 – 2020-02-23 (×9): 1000 mg via ORAL
  Filled 2020-02-20 (×11): qty 2

## 2020-02-20 MED ORDER — EPHEDRINE 5 MG/ML INJ
INTRAVENOUS | Status: AC
  Filled 2020-02-20: qty 10

## 2020-02-20 MED ORDER — SUCCINYLCHOLINE CHLORIDE 200 MG/10ML IV SOSY
PREFILLED_SYRINGE | INTRAVENOUS | Status: AC
  Filled 2020-02-20: qty 10

## 2020-02-20 MED ORDER — LIDOCAINE HCL (PF) 2 % IJ SOLN
INTRAMUSCULAR | Status: AC
  Filled 2020-02-20: qty 5

## 2020-02-20 MED ORDER — OXYCODONE HCL 5 MG/5ML PO SOLN: 5.0000 mg | Freq: Once | ORAL | Status: DC | PRN

## 2020-02-20 MED ORDER — OXYCODONE HCL 5 MG PO TABS
5.0000 mg | ORAL_TABLET | ORAL | Status: DC | PRN
  Administered 2020-02-21: 5 mg via ORAL
  Filled 2020-02-20: qty 1

## 2020-02-20 MED ORDER — ONDANSETRON HCL 4 MG/2ML IJ SOLN
INTRAMUSCULAR | Status: DC | PRN
  Administered 2020-02-20: 4 mg via INTRAVENOUS

## 2020-02-20 MED ORDER — CHLORHEXIDINE GLUCONATE 0.12 % MT SOLN
OROMUCOSAL | Status: AC
  Filled 2020-02-20: qty 15

## 2020-02-20 MED ORDER — FENTANYL CITRATE (PF) 100 MCG/2ML IJ SOLN
INTRAMUSCULAR | Status: AC
  Filled 2020-02-20: qty 2

## 2020-02-20 MED ORDER — FENTANYL CITRATE (PF) 100 MCG/2ML IJ SOLN
INTRAMUSCULAR | Status: DC | PRN
  Administered 2020-02-20 (×2): 25 ug via INTRAVENOUS

## 2020-02-20 MED ORDER — PIPERACILLIN-TAZOBACTAM 3.375 G IVPB
3.3750 g | Freq: Three times a day (TID) | INTRAVENOUS | Status: DC
  Administered 2020-02-20 – 2020-02-23 (×8): 3.375 g via INTRAVENOUS
  Filled 2020-02-20 (×8): qty 50

## 2020-02-20 MED ORDER — PROPOFOL 10 MG/ML IV BOLUS
INTRAVENOUS | Status: DC | PRN
  Administered 2020-02-20: 10 mg via INTRAVENOUS
  Administered 2020-02-20: 70 mg via INTRAVENOUS

## 2020-02-20 SURGICAL SUPPLY — 39 items
APPLIER CLIP 5 13 M/L LIGAMAX5 (MISCELLANEOUS)
BLADE CLIPPER SURG (BLADE) ×2 IMPLANT
BULB RESERV EVAC DRAIN JP 100C (MISCELLANEOUS) ×2 IMPLANT
CANISTER SUCT 1200ML W/VALVE (MISCELLANEOUS) ×2 IMPLANT
CHLORAPREP W/TINT 26 (MISCELLANEOUS) ×2 IMPLANT
CLIP APPLIE 5 13 M/L LIGAMAX5 (MISCELLANEOUS) IMPLANT
COVER WAND RF STERILE (DRAPES) ×2 IMPLANT
CUTTER FLEX LINEAR 45M (STAPLE) ×2 IMPLANT
DERMABOND ADVANCED (GAUZE/BANDAGES/DRESSINGS) ×1
DERMABOND ADVANCED .7 DNX12 (GAUZE/BANDAGES/DRESSINGS) ×1 IMPLANT
DRAIN CHANNEL JP 19F (MISCELLANEOUS) ×2 IMPLANT
ELECT CAUTERY BLADE 6.4 (BLADE) ×2 IMPLANT
ELECT REM PT RETURN 9FT ADLT (ELECTROSURGICAL) ×2
ELECTRODE REM PT RTRN 9FT ADLT (ELECTROSURGICAL) ×1 IMPLANT
GLOVE BIO SURGEON STRL SZ7 (GLOVE) ×2 IMPLANT
GOWN STRL REUS W/ TWL LRG LVL3 (GOWN DISPOSABLE) ×2 IMPLANT
GOWN STRL REUS W/TWL LRG LVL3 (GOWN DISPOSABLE) ×2
IRRIGATION STRYKERFLOW (MISCELLANEOUS) ×1 IMPLANT
IRRIGATOR STRYKERFLOW (MISCELLANEOUS) ×2
IV NS 1000ML (IV SOLUTION) ×1
IV NS 1000ML BAXH (IV SOLUTION) ×1 IMPLANT
NEEDLE HYPO 22GX1.5 SAFETY (NEEDLE) ×2 IMPLANT
NS IRRIG 500ML POUR BTL (IV SOLUTION) ×2 IMPLANT
PACK LAP CHOLECYSTECTOMY (MISCELLANEOUS) ×2 IMPLANT
PENCIL ELECTRO HAND CTR (MISCELLANEOUS) ×2 IMPLANT
POUCH SPECIMEN RETRIEVAL 10MM (ENDOMECHANICALS) ×2 IMPLANT
RELOAD 45 VASCULAR/THIN (ENDOMECHANICALS) IMPLANT
RELOAD STAPLE TA45 3.5 REG BLU (ENDOMECHANICALS) ×4 IMPLANT
SCISSORS METZENBAUM CVD 33 (INSTRUMENTS) IMPLANT
SHEARS HARMONIC ACE PLUS 36CM (ENDOMECHANICALS) ×2 IMPLANT
SLEEVE ENDOPATH XCEL 5M (ENDOMECHANICALS) ×2 IMPLANT
SPONGE LAP 18X18 RF (DISPOSABLE) ×2 IMPLANT
SUT MNCRL AB 4-0 PS2 18 (SUTURE) ×2 IMPLANT
SUT VICRYL 0 AB UR-6 (SUTURE) ×4 IMPLANT
SYR 20ML LL LF (SYRINGE) ×2 IMPLANT
TRAY FOLEY MTR SLVR 16FR STAT (SET/KITS/TRAYS/PACK) ×2 IMPLANT
TROCAR XCEL BLUNT TIP 100MML (ENDOMECHANICALS) ×2 IMPLANT
TROCAR XCEL NON-BLD 5MMX100MML (ENDOMECHANICALS) ×4 IMPLANT
TUBING EVAC SMOKE HEATED PNEUM (TUBING) ×2 IMPLANT

## 2020-02-20 NOTE — Anesthesia Procedure Notes (Signed)
Performed by: Lendon Colonel, CRNA

## 2020-02-20 NOTE — ED Provider Notes (Signed)
[facility] Emergency Department Provider Note   ____________________________________________    I have reviewed the triage vital signs and the nursing notes.   HISTORY  Chief Complaint Abdominal Pain     HPI Audrey Duran is a 85 y.o. female with a history of atrial fibrillation, COPD, CAD presents with complaints of right lower quadrant abdominal pain x3 days.  She reports nausea vomiting, some loose stools.  She reports the pain is moderate to severe.  She has never had this before.  No history of abdominal surgery reported.  On review of records the patient has had a total abdominal hysterectomy.  No fevers or chills.  Patient is not take anything for this  Past Medical History:  Diagnosis Date  . Anemia   . Atrial fibrillation ([place])   . COPD (chronic obstructive pulmonary disease) ([place])   . Coronary artery disease 1995   s/p AMI , no history of stents,  [name]  . Hyperlipidemia   . Ischemic cardiomyopathy Dec 2011   ETT Sestamibi study apical scar, no ischemia, [name]  . Myocardial infarction ([place])   . Recurrent HSV (herpes simplex virus)     Patient Active Problem List   Diagnosis Date Noted  . History of herpes labialis 02/05/2020  . Lower extremity edema 02/01/2020  . Paranoid delusion ([place]) 01/30/2020  . Takes iron supplements 08/11/2019  . Acute midline thoracic back pain 08/11/2019  . Unintentional weight loss of more than 10 pounds 04/16/2019  . Episodes of formed visual hallucinations 10/12/2018  . Abnormal mammogram of right breast 04/08/2018  . Impaired fasting glucose 08/29/2017  . Prediabetes 05/21/2016  . History of vertigo 11/16/2015  . Hospital discharge follow-up 04/19/2015  . History of Clostridium difficile colitis 12/14/2014  . Long-term use of high-risk medication 05/08/2014  . S/P TAH-BSO (total abdominal hysterectomy and bilateral salpingo-oophorectomy) 10/26/2013  . Vitamin D deficiency 10/12/2013    . Abnormal stress test 11/18/2012  . CKD (chronic kidney disease) stage 3, GFR 30-59 ml/min 10/19/2012  . Eosinophilia 09/21/2012  . Anxiety state 09/20/2012  . Osteopenia of the elderly 06/20/2012  . At high risk for falls 06/20/2012  . Encounter for preventive health examination 04/25/2012  . Atrophic vaginitis 01/17/2012  . Paroxysmal atrial fibrillation (HCC)   . Recurrent HSV (herpes simplex virus)   . COPD (chronic obstructive pulmonary disease) ([place])   . Ischemic cardiomyopathy   . Cystocele or rectocele with complete uterine prolapse 08/02/2011  . Screening for breast cancer 07/31/2011  . Screening for cervical cancer 07/31/2011  . Screening for colon cancer 07/31/2011  . Coronary artery disease   . History of myocardial infarction   . Hyperlipidemia LDL goal <70     Past Surgical History:  Procedure Laterality Date  . ABDOMINAL HYSTERECTOMY  june 2014  . BREAST EXCISIONAL BIOPSY Left 1990's   neg  . 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 CATARACT EXTRACTION  2004, 2006  . JOINT REPLACEMENT  July 2013   total hip , Sabra Heck    Prior to Admission medications   Medication Sig Start Date End Date Taking? Authorizing Provider  acyclovir (ZOVIRAX) 400 MG tablet Take 1 tablet (400 mg total) by mouth daily. 01/10/20   Crecencio Mc, MD  alendronate (FOSAMAX) 70 MG tablet Take 1 tablet (70 mg total) by mouth every 7 (seven) days. Take with a full glass of water on an empty stomach. 12/12/19   Crecencio Mc, MD  ALPRAZolam (XANAX) 0.5 MG tablet TAKE 1 TABLET BY MOUTH AT BEDTIME AS NEEDED FOR SLEEP 12/19/19   Crecencio Mc, MD  aspirin 81 MG tablet Take 81 mg by mouth at bedtime.     [provider]  carbonyl iron (FEOSOL) 45 MG TABS Take one by mouth daily    [provider]  Cholecalciferol (VITAMIN D3) 3000 UNITS TABS Take 1 tablet by mouth daily. Take one by mouth daily 07/31/11   Crecencio Mc, MD  cyanocobalamin 1000 MCG tablet Take  one by mouth daily     [provider]  DULoxetine (CYMBALTA) 20 MG capsule Take by mouth. 01/30/20   [provider]  furosemide (LASIX) 20 MG tablet Take 0.5 tablets (10 mg total) by mouth daily. 01/30/20   Crecencio Mc, MD  isosorbide mononitrate (IMDUR) 60 MG 24 hr tablet Take 1 tablet (60 mg total) by mouth daily. 02/15/20   Crecencio Mc, MD  losartan (COZAAR) 25 MG tablet Take 1 tablet (25 mg total) by mouth daily. 08/18/19   Crecencio Mc, MD  magnesium oxide (MAG-OX) 400 MG tablet Take 400 mg by mouth daily.      [provider]  meclizine (ANTIVERT) 50 MG tablet Take by mouth.    [provider]  Omega-3 Fatty Acids (FISH OIL) 1200 MG CAPS Take one by mouth daily     [provider]  pravastatin (PRAVACHOL) 80 MG tablet Take 1 tablet (80 mg total) by mouth daily. 02/15/20   Crecencio Mc, MD  risperiDONE (RISPERDAL) 1 MG tablet Take 1 tablet (1 mg total) by mouth at bedtime. 02/13/20   Crecencio Mc, MD  timolol (TIMOPTIC) 0.5 % ophthalmic solution Place 1 drop into both eyes 2 (two) times daily.  01/03/19   [provider]     Allergies Azithromycin, Clarithromycin, Diclofenac, Lisinopril, Meloxicam, Nitrofurantoin monohyd macro, Zocor [simvastatin], and Penicillins  Family History  Problem Relation Age of Onset  . Cancer Mother 35       Pancreatic   . Heart disease Father   . Cancer Sister 55       Breast Cancer  . Breast cancer Sister 71    Social History Social History   Tobacco Use  . Smoking status: Former Smoker    Quit date: 01/13/1992    Years since quitting: 28.1  . Smokeless tobacco: Never Used  Substance Use Topics  . Alcohol use: No    Alcohol/week: 0.0 standard drinks  . Drug use: No    Review of Systems  Constitutional: No fever/chills Eyes: No visual changes.  ENT: No sore throat. Cardiovascular: Denies chest pain. Respiratory: Denies shortness of breath. Gastrointestinal: As  above Genitourinary: No dysuria no frequency, no vaginal bleeding Musculoskeletal: Negative for back pain. Skin: Negative for rash. Neurological: Negative for headaches or weakness   ____________________________________________   PHYSICAL EXAM:  VITAL SIGNS: ED Triage Vitals  Enc Vitals Group     BP 02/20/20 0708 (!) 150/65     Pulse Rate 02/20/20 0708 67     Resp 02/20/20 0708 18     Temp 02/20/20 0711 99.1 F (37.3 C)     Temp src --      SpO2 02/20/20 0708 96 %     Weight 02/20/20 0708 55.8 kg (123 lb)     Height 02/20/20 0708 1.6 m (5\' 3" )     Head Circumference --      Peak Flow --  Pain Score 02/20/20 0708 10     Pain Loc --      Pain Edu? --      Excl. in [place]? --     Constitutional: Alert and oriented  Nose: No congestion/rhinnorhea. Mouth/Throat: Mucous membranes are moist.    Cardiovascular: Normal rate, regular rhythm. Grossly normal heart sounds.  Good peripheral circulation. Respiratory: Normal respiratory effort.  No retractions. Lungs CTAB. Gastrointestinal: Soft, tenderness palpation the right lower quadrant, no distention, no CVA tenderness  Musculoskeletal:   Warm and well perfused Neurologic:  Normal speech and language. No gross focal neurologic deficits are appreciated.  Skin:  Skin is warm, dry and intact. No rash noted. Psychiatric: Mood and affect are normal. Speech and behavior are normal.  ____________________________________________   LABS (all labs ordered are listed, but only abnormal results are displayed)  Labs Reviewed  COMPREHENSIVE METABOLIC PANEL - Abnormal; Notable for the following components:      Result Value   Glucose, Bld 154 (*)    GFR calc non Af Amer 59 (*)    All other components within normal limits  SARS CORONAVIRUS 2 BY RT PCR (HOSPITAL ORDER, [place])  LIPASE, BLOOD  CBC  URINALYSIS, COMPLETE (UACMP) WITH MICROSCOPIC    ____________________________________________  EKG  None ____________________________________________  RADIOLOGY  CT abdomen pelvis ____________________________________________   PROCEDURES  Procedure(s) performed: No  Procedures   Critical Care performed: No ____________________________________________   INITIAL IMPRESSION / ASSESSMENT AND PLAN / ED COURSE  Pertinent labs & imaging results that were available during my care of the patient were reviewed by me and considered in my medical decision making (see chart for details).  Patient presents with 3 days of abdominal pain as noted above.  Pain is primarily in the right lower quadrant.  Differential includes appendicitis, colitis, diverticulitis, UTI  We will treat with IV morphine, IV Zofran, IV fluids  Lab work overall is quite reassuring, white blood cell count is normal.  Lipase is normal.  Chemistries are normal.  Pending CT abdomen pelvis to evaluate further  CT abdomen pelvis consistent with acute complicated appendicitis  Ordered IV Zosyn  Consulted surgery, contacted Dr. Dahlia Byes of surgery service    ____________________________________________   FINAL CLINICAL IMPRESSION(S) / ED DIAGNOSES  Final diagnoses:  Acute appendicitis with localized peritonitis, without perforation, abscess, or gangrene        Note:  This document was prepared using Dragon voice recognition software and may include unintentional dictation errors.   Lavonia Drafts, MD 02/20/20 1330

## 2020-02-20 NOTE — Anesthesia Preprocedure Evaluation (Signed)
Anesthesia Evaluation  Patient identified by MRN, date of birth, ID band Patient awake    Reviewed: Allergy & Precautions, NPO status , Patient's Chart, lab work & pertinent test results  History of Anesthesia Complications Negative for: history of anesthetic complications  Airway Mallampati: II  TM Distance: >3 FB Neck ROM: Full    Dental  (+) Upper Dentures, Lower Dentures, Dental Advisory Given   Pulmonary neg sleep apnea, COPD, Patient abstained from smoking.Not current smoker, former smoker,  Patient denies copd   Pulmonary exam normal breath sounds clear to auscultation       Cardiovascular Exercise Tolerance: Good METS(-) hypertension+ CAD and + Past MI  + dysrhythmias Atrial Fibrillation  Rhythm:Regular Rate:Normal - Systolic murmurs    Neuro/Psych PSYCHIATRIC DISORDERS Anxiety negative neurological ROS     GI/Hepatic neg GERD  ,(+)     (-) substance abuse  ,   Endo/Other  neg diabetes  Renal/GU Renal disease     Musculoskeletal   Abdominal   Peds  Hematology   Anesthesia Other Findings Past Medical History: No date: Anemia No date: Atrial fibrillation (HCC) No date: COPD (chronic obstructive pulmonary disease) ([place]) 1995: Coronary artery disease     Comment:  s/p AMI , no history of stents,  [name] No date: Hyperlipidemia Dec 2011: Ischemic cardiomyopathy     Comment:  ETT Sestamibi study apical scar, no ischemia, [name] No date: Myocardial infarction ([place]) No date: Recurrent HSV (herpes simplex virus)  Reproductive/Obstetrics                             Anesthesia Physical Anesthesia Plan  ASA: III  Anesthesia Plan: General   Post-op Pain Management:    Induction: Intravenous, Rapid sequence and Cricoid pressure planned  PONV Risk Score and Plan: 4 or greater and Ondansetron, Dexamethasone and Treatment may vary due to age or medical condition  Airway  Management Planned: Oral ETT  Additional Equipment: None  Intra-op Plan:   Post-operative Plan: Extubation in OR  Informed Consent: I have reviewed the patients History and Physical, chart, labs and discussed the procedure including the risks, benefits and alternatives for the proposed anesthesia with the patient or authorized representative who has indicated his/her understanding and acceptance.     Dental advisory given  Plan Discussed with: CRNA and Surgeon  Anesthesia Plan Comments: (Discussed risks of anesthesia with patient, including PONV, sore throat, lip/dental damage. Rare risks discussed as well, such as cardiorespiratory and neurological sequelae. Patient understands.)        Anesthesia Quick Evaluation

## 2020-02-20 NOTE — ED Notes (Signed)
Resumed care from [place] rn.  Pt alert.  Iv fluids infusing.  Consent signed for surgery.  Pt undressed.  Family with pt.
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
## 2020-02-20 NOTE — H&P (Signed)
[facility] SURGICAL HISTORY & PHYSICAL (cpt [phone])  HISTORY OF PRESENT ILLNESS (HPI):  85 y.o. female initially presented to the ED last night but left prior to being seen for abdominal pain, but the pain persisted to this morning and prompted her re-presentation. She reports around a 3 day history of progressively worsening RLQ abdominal pain which she described as sharp in nature. This is now a 10/10 in severity. She did not try anything at home for this. No history of similar in the past. She reports associated subjective fever, nausea, emesis, and diarrhea with the pain. No chills, cough, CP, SOB, or urinary changes. Only previous surgical history is abdominal hysterectomy. Of note, she has a history of rate controlled atrial fibrillation and she is not on any anti-coagulation. Work up in the ED yesterday revealed a leukocytosis to 14K but this was resolved this morning. The remainder of her laboratory work up was reassuring. CT Abdomen/Pelvis was concerning for acute uncomplicated appendicitis without abscess or pneumoperitoneum.  General surgery is consulted by emergency medicine physician Dr Lavonia Drafts, MD for evaluation and management of acute uncomplicated appendicitis.   PAST MEDICAL HISTORY (PMH):  Past Medical History:  Diagnosis Date  . Anemia   . Atrial fibrillation ([place])   . COPD (chronic obstructive pulmonary disease) ([place])   . Coronary artery disease 1995   s/p AMI , no history of stents,  [name]  . Hyperlipidemia   . Ischemic cardiomyopathy Dec 2011   ETT Sestamibi study apical scar, no ischemia, [name]  . Myocardial infarction ([place])   . Recurrent HSV (herpes simplex virus)     Reviewed. Otherwise negative.   PAST SURGICAL HISTORY ([place]):  Past Surgical History:  Procedure Laterality Date  . ABDOMINAL HYSTERECTOMY  june 2014  . BREAST EXCISIONAL BIOPSY Left 1990's   neg  . 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 CATARACT  EXTRACTION  2004, 2006  . JOINT REPLACEMENT  July 2013   total hip , Sabra Heck    Reviewed. Otherwise negative.   MEDICATIONS:  Prior to Admission medications   Medication Sig Start Date End Date Taking? Authorizing Provider  acyclovir (ZOVIRAX) 400 MG tablet Take 1 tablet (400 mg total) by mouth daily. 01/10/20   Crecencio Mc, MD  alendronate (FOSAMAX) 70 MG tablet Take 1 tablet (70 mg total) by mouth every 7 (seven) days. Take with a full glass of water on an empty stomach. 12/12/19   Crecencio Mc, MD  ALPRAZolam Duanne Moron) 0.5 MG tablet TAKE 1 TABLET BY MOUTH AT BEDTIME AS NEEDED FOR SLEEP 12/19/19   Crecencio Mc, MD  aspirin 81 MG tablet Take 81 mg by mouth at bedtime.     [provider]  carbonyl iron (FEOSOL) 45 MG TABS Take one by mouth daily    [provider]  Cholecalciferol (VITAMIN D3) 3000 UNITS TABS Take 1 tablet by mouth daily. Take one by mouth daily 07/31/11   Crecencio Mc, MD  cyanocobalamin 1000 MCG tablet Take one by mouth daily     [provider]  DULoxetine (CYMBALTA) 20 MG capsule Take by mouth. 01/30/20   [provider]  furosemide (LASIX) 20 MG tablet Take 0.5 tablets (10 mg total) by mouth daily. 01/30/20   Crecencio Mc, MD  isosorbide mononitrate (IMDUR) 60 MG 24 hr tablet Take 1 tablet (60 mg total) by mouth daily. 02/15/20   Crecencio Mc, MD  losartan (COZAAR) 25 MG tablet  Take 1 tablet (25 mg total) by mouth daily. 08/18/19   Crecencio Mc, MD  magnesium oxide (MAG-OX) 400 MG tablet Take 400 mg by mouth daily.      [provider]  meclizine (ANTIVERT) 50 MG tablet Take by mouth.    [provider]  Omega-3 Fatty Acids (FISH OIL) 1200 MG CAPS Take one by mouth daily     [provider]  pravastatin (PRAVACHOL) 80 MG tablet Take 1 tablet (80 mg total) by mouth daily. 02/15/20   Crecencio Mc, MD  risperiDONE (RISPERDAL) 1 MG tablet Take 1 tablet (1 mg total) by mouth at bedtime. 02/13/20   Crecencio Mc, MD  timolol (TIMOPTIC) 0.5 % ophthalmic solution Place 1 drop into both eyes 2 (two) times daily.  01/03/19   [provider]     ALLERGIES:  Allergies  Allergen Reactions  . Azithromycin     GI upset  . Clarithromycin     ? Rash  . Diclofenac     ? Rash  . Lisinopril Other (See Comments)    hyperkalemia  . Meloxicam     ? Rash  . Nitrofurantoin Monohyd Macro     ? rash  . Zocor [Simvastatin]   . Penicillins Rash     SOCIAL HISTORY:  Social History   Socioeconomic History  . Marital status: Divorced    Spouse name: Not on file  . Number of children: Not on file  . Years of education: Not on file  . Highest education level: Not on file  Occupational History  . Not on file  Tobacco Use  . Smoking status: Former Smoker    Quit date: 01/13/1992    Years since quitting: 28.1  . Smokeless tobacco: Never Used  Substance and Sexual Activity  . Alcohol use: No    Alcohol/week: 0.0 standard drinks  . Drug use: No  . Sexual activity: Not Currently  Other Topics Concern  . Not on file  Social History Narrative  . Not on file   Social Determinants of Health   Financial Resource Strain:   . Difficulty of Paying Living Expenses:   Food Insecurity:   . Worried About Charity fundraiser in the Last Year:   . Arboriculturist in the Last Year:   Transportation Needs:   . Film/video editor (Medical):   Marland Kitchen Lack of Transportation (Non-Medical):   Physical Activity:   . Days of Exercise per Week:   . Minutes of Exercise per Session:   Stress:   . Feeling of Stress :   Social Connections:   . Frequency of Communication with Friends and Family:   . Frequency of Social Gatherings with Friends and Family:   . Attends Religious Services:   . Active Member of Clubs or Organizations:   . Attends Archivist Meetings:   Marland Kitchen Marital Status:   Intimate Partner Violence:   . Fear of Current or Ex-Partner:   . Emotionally Abused:   Marland Kitchen Physically  Abused:   . Sexually Abused:      FAMILY HISTORY:  Family History  Problem Relation Age of Onset  . Cancer Mother 35       Pancreatic   . Heart disease Father   . Cancer Sister 40       Breast Cancer  . Breast cancer Sister 41    Otherwise negative.   REVIEW OF SYSTEMS:  Review of Systems  Constitutional: Positive for  fever (Subjective). Negative for chills.  HENT: Negative for congestion and sore throat.   Respiratory: Negative for cough and shortness of breath.   Cardiovascular: Negative for chest pain and palpitations.  Gastrointestinal: Positive for abdominal pain, diarrhea, nausea and vomiting.  Genitourinary: Negative for dysuria and urgency.  All other systems reviewed and are negative.   VITAL SIGNS:  Temp:  [98.9 F (37.2 C)-99.1 F (37.3 C)] 99.1 F (37.3 C) (07/27 0711) Pulse Rate:  [67-69] 67 (07/27 0708) Resp:  [18-20] 18 (07/27 0708) BP: (112-150)/(61-65) 150/65 (07/27 0708) SpO2:  [96 %] 96 % (07/27 0708) Weight:  [55.8 kg] 55.8 kg (07/27 0708)     Height: 5\' 3"  (160 cm) Weight: 55.8 kg BMI (Calculated): 21.79   PHYSICAL EXAM:  Physical Exam Vitals and nursing note reviewed. Exam conducted with a chaperone present.  Constitutional:      General: She is not in acute distress.    Appearance: She is well-developed and normal weight. She is not ill-appearing.  HENT:     Head: Normocephalic and atraumatic.     Mouth/Throat:     Comments: Covered with mask  Eyes:     General: No scleral icterus.    Extraocular Movements: Extraocular movements intact.  Cardiovascular:     Rate and Rhythm: Normal rate and regular rhythm.     Heart sounds: Normal heart sounds. No murmur heard.   Pulmonary:     Effort: Pulmonary effort is normal. No respiratory distress.     Breath sounds: Normal breath sounds.     Comments: Somewhat coarse breath sounds Abdominal:     General: A surgical scar is present. There is no distension.     Palpations: Abdomen is soft.      Tenderness: There is abdominal tenderness in the right lower quadrant. There is no guarding or rebound. Positive signs include Rovsing's sign and McBurney's sign.     Hernia: No hernia is present.  Genitourinary:    Comments: Deferred Skin:    General: Skin is warm and dry.     Coloration: Skin is not jaundiced or pale.  Neurological:     General: No focal deficit present.     Mental Status: She is alert and oriented to person, place, and time.  Psychiatric:        Mood and Affect: Mood normal.        Behavior: Behavior normal.     INTAKE/OUTPUT:  This shift: No intake/output data recorded.  Last 2 shifts: @IOLAST2SHIFTS @  Labs:  CBC Latest Ref Rng & Units 02/20/2020 02/19/2020 01/13/2020  WBC 4.0 - 10.5 K/uL 10.0 14.6(H) 7.3  Hemoglobin 12.0 - 15.0 g/dL 12.1 12.0 13.1  Hematocrit 36 - 46 % 38.1 36.9 39.4  Platelets 150 - 400 K/uL 208 212 190   CMP Latest Ref Rng & Units 02/20/2020 02/19/2020 02/01/2020  Glucose 70 - 99 mg/dL 154(H) 154(H) 113(H)  BUN 8 - 23 mg/dL 15 15 27(H)  Creatinine 0.44 - 1.00 mg/dL 0.91 1.02(H) 1.00  Sodium 135 - 145 mmol/L 137 137 137  Potassium 3.5 - 5.1 mmol/L 3.9 3.7 3.8  Chloride 98 - 111 mmol/L 104 103 102  CO2 22 - 32 mmol/L 22 25 28   Calcium 8.9 - 10.3 mg/dL 9.4 9.4 9.7  Total Protein 6.5 - 8.1 g/dL 6.6 6.8 -  Total Bilirubin 0.3 - 1.2 mg/dL 1.1 0.8 -  Alkaline Phos 38 - 126 U/L 45 48 -  AST 15 - 41 U/L 15 19 -  ALT 0 - 44 U/L 12 13 -    Imaging studies:   CT Abdomen/Pelvis (02/20/2020) personally reviewed showing acute uncomplicated appendicitis with inflammatory response but no abscess or pneumoperitoneum, and radiologist report reviewed below:  IMPRESSION: 1. Findings consistent with acute appendicitis. No abscess is noted.   Assessment/Plan: (ICD-10's: K35.80) 85 y.o. female with RLQ abdominal pain concerning for acute uncomplicated appendicitis.    - Will admit to general surgery  - Will plan on laparoscopic appendectomy with Dr  Dahlia Byes today pending OR/Anesthesia availability  - All risks, benefits, and alternatives to above procedure(s) were discussed with the patient, all of her questions were answered to her expressed satisfaction, patient expresses she wishes to proceed, and informed consent was obtained.   - NPO + IVF Resuscitation  - IV Abx (Zosyn)   - Monitor abdominal examination  - Pain control prn; antiemetics prn   - Morning labs  - medical management of comorbid conditions; hold for now  - DVT prophylaxis; hold for OR  All of the above findings and recommendations were discussed with the patient and her family, and all of their questions were answered to their expressed satisfaction.  -- Edison Simon, PA-C [facility] 02/20/2020, 1:31 PM [phone] M-F: 7am - 4pm

## 2020-02-20 NOTE — ED Triage Notes (Signed)
Here of nausea and vomiting with mid lower abdominal pain.  Here last night for same and left because did not want to wait.  Pt reports woke up at 5 AM and was vomiting again. No active vomiting in triage, declined nausea pill that was offered at this time.  VSS. NAD

## 2020-02-20 NOTE — ED Notes (Signed)
Pt to OR via stretcher.

## 2020-02-20 NOTE — Transfer of Care (Signed)
Immediate Anesthesia Transfer of Care Note  Patient: Audrey Duran  Procedure(s) Performed: APPENDECTOMY LAPAROSCOPIC (N/A )  Patient Location: PACU  Anesthesia Type:General  Level of Consciousness: sedated and patient cooperative  Airway & Oxygen Therapy: Patient Spontanous Breathing and Patient connected to nasal cannula oxygen  Post-op Assessment: Report given to RN and Post -op Vital signs reviewed and stable  Post vital signs: Reviewed and stable  Last Vitals:  Vitals Value Taken Time  BP 102/50 02/20/20 1923  Temp 36.2 C 02/20/20 1853  Pulse 86 02/20/20 1928  Resp 32 02/20/20 1928  SpO2 98 % 02/20/20 1928  Vitals shown include unvalidated device data.  Last Pain:  Vitals:   02/20/20 1923  TempSrc:   PainSc: 0-No pain      Patients Stated Pain Goal: 0 (12/87/86 7672)  Complications: No complications documented.

## 2020-02-20 NOTE — Op Note (Signed)
laparascopic appendectomy   [name] Date of operation:  02/20/2020  Indications: The patient presented with a history of  abdominal pain. Workup has revealed findings consistent with acute appendicitis.  Pre-operative Diagnosis: Acute appendicitis without mention of peritonitis  Post-operative Diagnosis: Same  Surgeon: Caroleen Hamman, MD, FACS  Anesthesia: General with endotracheal tube  Findings: perforated appendicitis with necrosis, purulent fluid within the abdominal cavity  Estimated Blood Loss: 10cc         Specimens: appendix         Complications:  none  Procedure Details  The patient was seen again in the preop area. The options of surgery versus observation were reviewed with the patient and/or family. The risks of bleeding, infection, recurrence of symptoms, negative laparoscopy, potential for an open procedure, bowel injury, abscess or infection, were all reviewed as well. The patient was taken to Operating Room, identified as Audrey Duran and the procedure verified as laparoscopic appendectomy. A Time Out was held and the above information confirmed.  The patient was placed in the supine position and general anesthesia was induced.  Antibiotic prophylaxis was administered and VT E prophylaxis was in place. A Foley catheter was placed by the nursing staff.   The abdomen was prepped and draped in a sterile fashion. An infraumbilical incision was made. A cutdown technique was used to enter the abdominal cavity. Two vicryl stitches were placed on the fascia and a Hasson trocar inserted. Pneumoperitoneum obtained. Two 5 mm ports were placed under direct visualization.   The appendix was identified and found to be acutely inflamed, necrotic and perforated, some purulent fluid within the abd cavity that was aspirated. The appendix was carefully dissected. The mesoappendix was divided with Harmonic scalpel. The base of the appendix was dissected out and divided with a  standard load Endo GIA.The appendix was placed in a Endo Catch bag and removed via the Hasson port. The right lower quadrant and pelvis was then irrigated with  normal saline which was aspirated. Inspection  failed to identify any additional bleeding and there were no signs of bowel injury. Again the right lower quadrant was inspected there was no sign of bleeding or bowel injury therefore pneumoperitoneum was released, all ports were removed.  The umbilical fascia was closed with 0 Vicryl interrupted sutures and the skin incisions were approximated with subcuticular 4-0 Monocryl. Dermabond was placed The patient tolerated the procedure well, there were no complications. The sponge lap and needle count were correct at the end of the procedure.  The patient was taken to the recovery room in stable condition to be admitted for continued care.    Caroleen Hamman, MD FACS

## 2020-02-21 ENCOUNTER — Encounter: Payer: Self-pay | Admitting: Surgery

## 2020-02-21 LAB — CBC
HCT: 32.9 % — ABNORMAL LOW (ref 36.0–46.0)
Hemoglobin: 10.4 g/dL — ABNORMAL LOW (ref 12.0–15.0)
MCH: 30.9 pg (ref 26.0–34.0)
MCHC: 31.6 g/dL (ref 30.0–36.0)
MCV: 97.6 fL (ref 80.0–100.0)
Platelets: 155 10*3/uL (ref 150–400)
RBC: 3.37 MIL/uL — ABNORMAL LOW (ref 3.87–5.11)
RDW: 13.5 % (ref 11.5–15.5)
WBC: 11.2 10*3/uL — ABNORMAL HIGH (ref 4.0–10.5)
nRBC: 0 % (ref 0.0–0.2)

## 2020-02-21 LAB — BASIC METABOLIC PANEL
Anion gap: 8 (ref 5–15)
BUN: 21 mg/dL (ref 8–23)
CO2: 25 mmol/L (ref 22–32)
Calcium: 8.3 mg/dL — ABNORMAL LOW (ref 8.9–10.3)
Chloride: 106 mmol/L (ref 98–111)
Creatinine, Ser: 1.14 mg/dL — ABNORMAL HIGH (ref 0.44–1.00)
GFR calc Af Amer: 52 mL/min — ABNORMAL LOW (ref >60)
GFR calc non Af Amer: 45 mL/min — ABNORMAL LOW (ref >60)
Glucose, Bld: 117 mg/dL — ABNORMAL HIGH (ref 70–99)
Potassium: 4 mmol/L (ref 3.5–5.1)
Sodium: 139 mmol/L (ref 135–145)

## 2020-02-21 MED ORDER — AMOXICILLIN-POT CLAVULANATE 875-125 MG PO TABS
1.0000 | ORAL_TABLET | Freq: Two times a day (BID) | ORAL | 0 refills | Status: DC
Start: 2020-02-21 — End: 2020-02-26

## 2020-02-21 MED ORDER — TIMOLOL MALEATE 0.5 % OP SOLN
1.0000 [drp] | Freq: Two times a day (BID) | OPHTHALMIC | Status: DC
  Administered 2020-02-21 – 2020-02-23 (×5): 1 [drp] via OPHTHALMIC
  Filled 2020-02-21: qty 5

## 2020-02-21 MED ORDER — PRAVASTATIN SODIUM 20 MG PO TABS
80.0000 mg | ORAL_TABLET | Freq: Every day | ORAL | Status: DC
  Administered 2020-02-22: 80 mg via ORAL
  Filled 2020-02-21 (×2): qty 4

## 2020-02-21 MED ORDER — OXYCODONE HCL 5 MG PO TABS: 5.0000 mg | ORAL_TABLET | Freq: Four times a day (QID) | ORAL | 0 refills | Status: DC | PRN

## 2020-02-21 MED ORDER — FUROSEMIDE 20 MG PO TABS
10.0000 mg | ORAL_TABLET | Freq: Every day | ORAL | Status: DC
  Administered 2020-02-21: 10 mg via ORAL
  Filled 2020-02-21: qty 1

## 2020-02-21 MED ORDER — ISOSORBIDE MONONITRATE ER 30 MG PO TB24
60.0000 mg | ORAL_TABLET | Freq: Every day | ORAL | Status: DC
  Administered 2020-02-21 – 2020-02-23 (×3): 60 mg via ORAL
  Filled 2020-02-21 (×3): qty 2

## 2020-02-21 MED ORDER — DULOXETINE HCL 20 MG PO CPEP
20.0000 mg | ORAL_CAPSULE | Freq: Every day | ORAL | Status: DC
  Administered 2020-02-21 – 2020-02-23 (×3): 20 mg via ORAL
  Filled 2020-02-21 (×3): qty 1

## 2020-02-21 MED ORDER — ALPRAZOLAM 0.5 MG PO TABS: 0.5000 mg | ORAL_TABLET | Freq: Every evening | ORAL | Status: DC | PRN

## 2020-02-21 MED ORDER — ALENDRONATE SODIUM 70 MG PO TABS: 70.0000 mg | ORAL_TABLET | ORAL | Status: DC

## 2020-02-21 MED ORDER — LOSARTAN POTASSIUM 25 MG PO TABS
25.0000 mg | ORAL_TABLET | Freq: Every day | ORAL | Status: DC
  Filled 2020-02-21: qty 1

## 2020-02-21 MED ORDER — SODIUM CHLORIDE 0.9 % IV BOLUS
1000.0000 mL | INTRAVENOUS | Status: AC
  Administered 2020-02-21: 1000 mL via INTRAVENOUS

## 2020-02-21 MED ORDER — RISPERIDONE 1 MG PO TABS
1.0000 mg | ORAL_TABLET | Freq: Every day | ORAL | Status: DC
  Administered 2020-02-21 – 2020-02-22 (×2): 1 mg via ORAL
  Filled 2020-02-21 (×3): qty 1

## 2020-02-21 NOTE — Anesthesia Postprocedure Evaluation (Signed)
Anesthesia Post Note  Patient: Audrey Duran  Procedure(s) Performed: APPENDECTOMY LAPAROSCOPIC (N/A )  Patient location during evaluation: PACU Anesthesia Type: General Level of consciousness: awake and alert Pain management: pain level controlled Vital Signs Assessment: post-procedure vital signs reviewed and stable Respiratory status: spontaneous breathing, nonlabored ventilation, respiratory function stable and patient connected to nasal cannula oxygen Cardiovascular status: blood pressure returned to baseline and stable Postop Assessment: no apparent nausea or vomiting Anesthetic complications: no   No complications documented.   Last Vitals:  Vitals:   02/20/20 2305 02/21/20 0511  BP: (!) 107/33 (!) 121/38  Pulse: 68 62  Resp: 16 20  Temp: 36.4 C 36.5 C  SpO2: 99% 95%    Last Pain:  Vitals:   02/21/20 0511  TempSrc: Oral  PainSc:                  Arita Miss

## 2020-02-21 NOTE — Progress Notes (Signed)
PHARMACIST - PHYSICIAN COMMUNICATION  CONCERNING: P&T Medication Policy Regarding Oral Bisphosphonates  RECOMMENDATION: Your order for alendronate (Fosamax), ibandronate (Boniva), or risedronate (Actonel) has been discontinued at this time.  If the patient's post-hospital medical condition warrants safe use of this class of drugs, please resume the pre-hospital regimen upon discharge.  DESCRIPTION:  Alendronate (Fosamax), ibandronate (Boniva), and risedronate (Actonel) can cause severe esophageal erosions in patients who are unable to remain upright at least 30 minutes after taking this medication.   Since brief interruptions in therapy are thought to have minimal impact on bone mineral density, the [facility] has established that bisphosphonate orders should be routinely discontinued during hospitalization.   To override this safety policy and permit administration of Boniva, Fosamax, or Actonel in the hospital, prescribers must write "DO NOT HOLD" in the comments section when placing the order for this class of medications.   Pernell Dupre, PharmD, BCPS Clinical Pharmacist 02/21/2020 11:33 AM

## 2020-02-21 NOTE — Discharge Summary (Deleted)
[facility] SURGICAL DISCHARGE SUMMARY  Patient ID: Audrey Duran MRN: [id number] DOB/AGE: 1935/02/22 85 y.o.  Admit date: 02/20/2020 Discharge date: 02/21/2020  Discharge Diagnoses Patient Active Problem List   Diagnosis Date Noted  . Acute appendicitis 02/20/2020   Consultants None  Procedures 02/20/2020:  Laparoscopic Appendectomy   HPI: 85 y.o. female initially presented to the ED last night but left prior to being seen for abdominal pain, but the pain persisted to this morning and prompted her re-presentation. She reports around a 3 day history of progressively worsening RLQ abdominal pain which she described as sharp in nature. This is now a 10/10 in severity. She did not try anything at home for this. No history of similar in the past. She reports associated subjective fever, nausea, emesis, and diarrhea with the pain. No chills, cough, CP, SOB, or urinary changes. Only previous surgical history is abdominal hysterectomy. Of note, she has a history of rate controlled atrial fibrillation and she is not on any anti-coagulation. Work up in the ED yesterday revealed a leukocytosis to 14K but this was resolved this morning. The remainder of her laboratory work up was reassuring. CT Abdomen/Pelvis was concerning for acute uncomplicated appendicitis without abscess or pneumoperitoneum.  Hospital Course: Informed consent was obtained and documented, and patient underwent uneventful laparoscopic appendectomy (Dr Dahlia Byes, 02/20/2020).  Post-operatively, patient's pain and symptoms improved/resolved and advancement of patient's diet and ambulation were well-tolerated. The remainder of patient's hospital course was essentially unremarkable, and discharge planning was initiated accordingly with patient safely able to be discharged home with appropriate discharge instructions, antibiotics (Augmentin x10 days --> She has a noted penicillin allergy, I discussed this with her. This was  as a child and she tolerated Zosyn in the hospital without issue), pain control, and outpatient follow-up after all of her questions were answered to her expressed satisfaction.   Discharge Condition: Good   Physical Examination:  Constitutional: Well appearing female, NAD Pulmonary: Normal effort, no respiratory distress Gastrointestinal: Soft, incisional soreness, non-distended, no rebound/guarding. Surgical drain in suprapubic region with seropurulent Skin: Laparoscopic incisions are CDI with dermabond, no erythema or drainage    Allergies as of 02/21/2020      Reactions   Azithromycin    GI upset   Clarithromycin    ? Rash   Diclofenac    ? Rash   Lisinopril Other (See Comments)   hyperkalemia   Meloxicam    ? Rash   Nitrofurantoin Monohyd Macro    ? rash   Zocor [simvastatin]    Penicillins Rash      Medication List    TAKE these medications   acyclovir 400 MG tablet Commonly known as: ZOVIRAX Take 1 tablet (400 mg total) by mouth daily.   alendronate 70 MG tablet Commonly known as: FOSAMAX Take 1 tablet (70 mg total) by mouth every 7 (seven) days. Take with a full glass of water on an empty stomach.   ALPRAZolam 0.5 MG tablet Commonly known as: XANAX TAKE 1 TABLET BY MOUTH AT BEDTIME AS NEEDED FOR SLEEP What changed:   reasons to take this  additional instructions   amoxicillin-clavulanate 875-125 MG tablet Commonly known as: Augmentin Take 1 tablet by mouth 2 (two) times daily for 10 days.   aspirin 81 MG tablet Take 81 mg by mouth at bedtime.   cyanocobalamin 1000 MCG tablet Take one by mouth daily   DULoxetine 20 MG capsule Commonly known as: CYMBALTA Take 20 mg by mouth daily.   Feosol  45 MG Tabs tablet Generic drug: carbonyl iron Take one by mouth daily   Fish Oil 1200 MG Caps Take 1 capsule by mouth daily. Take one by mouth daily   furosemide 20 MG tablet Commonly known as: LASIX Take 0.5 tablets (10 mg total) by mouth daily.    isosorbide mononitrate 60 MG 24 hr tablet Commonly known as: IMDUR Take 1 tablet (60 mg total) by mouth daily.   losartan 25 MG tablet Commonly known as: COZAAR Take 1 tablet (25 mg total) by mouth daily.   magnesium oxide 400 MG tablet Commonly known as: MAG-OX Take 400 mg by mouth daily.   meclizine 50 MG tablet Commonly known as: ANTIVERT Take 50 mg by mouth 3 (three) times daily as needed.   oxyCODONE 5 MG immediate release tablet Commonly known as: Oxy IR/ROXICODONE Take 1 tablet (5 mg total) by mouth every 6 (six) hours as needed for severe pain or breakthrough pain.   pravastatin 80 MG tablet Commonly known as: PRAVACHOL Take 1 tablet (80 mg total) by mouth daily.   risperiDONE 1 MG tablet Commonly known as: RISPERDAL Take 1 tablet (1 mg total) by mouth at bedtime.   timolol 0.5 % ophthalmic solution Commonly known as: TIMOPTIC Place 1 drop into both eyes 2 (two) times daily.   Vitamin D3 75 MCG (3000 UT) Tabs Take 1 tablet by mouth daily. Take one by mouth daily         Follow-up Information    Tylene Fantasia, PA-C. Schedule an appointment as soon as possible for a visit on 02/26/2020.   Specialty: Physician Assistant Why: s/p laparoscopic appendectomy, has drain needs follow up early next week for removal....patient prefers 130 PM appointment, that is fine with me Contact information: [address] [place] [address] [phone]                Time spent on discharge management including discussion of hospital course, clinical condition, outpatient instructions, prescriptions, and follow up with the patient and members of the medical team: >30 minutes  -- Edison Simon , PA-C [facility]  02/21/2020, 9:42 AM [phone] M-F: 7am - 4pm

## 2020-02-21 NOTE — Progress Notes (Addendum)
Pt attempted to urinate but unable to.Pt. bladder was scanned and scanner registered 323 mL.MD ordered to In and out cath if over 300 mL.

## 2020-02-21 NOTE — Progress Notes (Signed)
Physical Therapy Evaluation Patient Details Name: Audrey Duran MRN: [id number] DOB: 14-Oct-1934 Today's Date: 02/21/2020   History of Present Illness  85 y.o. female initially presented to the ED last night but left prior to being seen for abdominal pain, but the pain persisted to this morning and prompted her re-presentation. She reports around a 3 day history of progressively worsening RLQ abdominal pain which she described as sharp in nature. This is now a 10/10 in severity. She did not try anything at home for this. No history of similar in the past. She reports associated subjective fever, nausea, emesis, and diarrhea with the pain. No chills, cough, CP, SOB, or urinary changes. Only previous surgical history is abdominal hysterectomy. Of note, she has a history of rate controlled atrial fibrillation and she is not on any anti-coagulation. Work up in the ED yesterday revealed a leukocytosis to 14K but this was resolved this morning. The remainder of her laboratory work up was reassuring. CT Abdomen/Pelvis was concerning for acute uncomplicated appendicitis without abscess or pneumoperitoneum.  Clinical Impression  Patient agrees to PT eval. She has 3/5 BLE hip flex and knee extension strength. She has good sitting balance and good static standing balance without UE support with RW. She is MI with bed mobility, MI assist for transfers sit to stand with RW. She ambulates with RW 300 feet with SBA, 2 L O2. She has decreased O2 saturation of 73% following gait.and does not improve O2 saturation for 8 mins. NSG is notified and called into the patient room due to desaturation.  Patient will continue to benefit from skilled PT to improve mobility and strength.    Follow Up Recommendations Home health PT    Equipment Recommendations  Rolling walker with 5" wheels    Recommendations for Other Services       Precautions / Restrictions Precautions Precautions: None Restrictions Weight Bearing  Restrictions: No      Mobility  Bed Mobility Overal bed mobility: Modified Independent             General bed mobility comments: slow and needs VC  Transfers Overall transfer level: Modified independent Equipment used: Rolling walker (2 wheeled)             General transfer comment:  (VC not to place B hands on RW)  Ambulation/Gait Ambulation/Gait assistance: Modified independent (Device/Increase time) Gait Distance (Feet): 300 Feet Assistive device: Rolling walker (2 wheeled)       General Gait Details:  (decreased gait speed)  Stairs            Wheelchair Mobility    Modified Rankin (Stroke Patients Only)       Balance Overall balance assessment: Needs assistance Sitting-balance support: No upper extremity supported       Standing balance support: No upper extremity supported                                 Pertinent Vitals/Pain Pain Assessment: Faces Faces Pain Scale: Hurts a little bit Pain Location:  (abdomen) Pain Descriptors / Indicators: Other (Comment) (soreness)    Home Living Family/patient expects to be discharged to:: Private residence Living Arrangements: Alone Available Help at Discharge:  (none)   Home Access: Stairs to enter Entrance Stairs-Rails: Right;Left Entrance Stairs-Number of Steps: 5 Home Layout: One level Home Equipment: Walker - 2 wheels;Grab bars - tub/shower      Prior Function Level of Independence: Independent  Hand Dominance        Extremity/Trunk Assessment   Upper Extremity Assessment Upper Extremity Assessment: Overall WFL for tasks assessed    Lower Extremity Assessment Lower Extremity Assessment: Overall WFL for tasks assessed    Cervical / Trunk Assessment Cervical / Trunk Assessment: Normal  Communication   Communication: No difficulties  Cognition Arousal/Alertness: Awake/alert Behavior During Therapy: WFL for tasks assessed/performed Overall  Cognitive Status: Within Functional Limits for tasks assessed                                        General Comments      Exercises     Assessment/Plan    PT Assessment Patient needs continued PT services  PT Problem List Decreased activity tolerance;Decreased mobility;Cardiopulmonary status limiting activity       PT Treatment Interventions Gait training;Therapeutic activities    PT Goals (Current goals can be found in the Care Plan section)  Acute Rehab PT Goals Patient Stated Goal: no goals stated PT Goal Formulation: Patient unable to participate in goal setting Time For Goal Achievement: 03/06/20 Potential to Achieve Goals: Good    Frequency Min 2X/week   Barriers to discharge Decreased caregiver support      Co-evaluation               AM-PAC PT "6 Clicks" Mobility  Outcome Measure Help needed turning from your back to your side while in a flat bed without using bedrails?: None Help needed moving from lying on your back to sitting on the side of a flat bed without using bedrails?: None Help needed moving to and from a bed to a chair (including a wheelchair)?: A Little Help needed standing up from a chair using your arms (e.g., wheelchair or bedside chair)?: A Little Help needed to walk in hospital room?: A Little Help needed climbing 3-5 steps with a railing? : A Little 6 Click Score: 20    End of Session Equipment Utilized During Treatment: Gait belt;Oxygen Activity Tolerance: Treatment limited secondary to medical complications (Comment) (O2 saturation decreased to 73% with ambulation) Patient left: in bed;with bed alarm set Nurse Communication: Other (comment) (O2 saturation decrease) PT Visit Diagnosis: Muscle weakness (generalized) (M62.81);Difficulty in walking, not elsewhere classified (R26.2)    Time: 1030-1100 PT Time Calculation (min) (ACUTE ONLY): 30 min   Charges:   PT Evaluation $PT Eval Low Complexity: 1 Low PT  Treatments $Gait Training: 8-22 mins $Therapeutic Activity: 8-22 mins         Alanson Puls, PT DPT 02/21/2020, 11:50 AM

## 2020-02-21 NOTE — Progress Notes (Signed)
   02/21/20 1704  Assess: MEWS Score  Temp 97.8 F (36.6 C)  BP (!) 80/62  Pulse Rate 66  Resp 20  Level of Consciousness Alert  SpO2 91 %  O2 Device Room Air  Assess: MEWS Score  MEWS Temp 0  MEWS Systolic 2  MEWS Pulse 0  MEWS RR 0  MEWS LOC 0  MEWS Score 2  MEWS Score Color Yellow  Assess: if the MEWS score is Yellow or Red  Were vital signs taken at a resting state? Yes  Focused Assessment No change from prior assessment  Early Detection of Sepsis Score *See Row Information* Low  MEWS guidelines implemented *See Row Information* Yes  Treat  MEWS Interventions Other (Comment) (fluid bolus given)  Pain Scale 0-10  Pain Score 0  Take Vital Signs  Increase Vital Sign Frequency  Yellow: Q 2hr X 2 then Q 4hr X 2, if remains yellow, continue Q 4hrs  Escalate  MEWS: Escalate Red: discuss with charge nurse/RN and provider, consider discussing with RRT  Notify: Charge Nurse/RN  Name of Charge Nurse/RN Notified  (Co-RN was notified)  Date Charge Nurse/RN Notified 02/21/20  Time Charge Nurse/RN Notified 9643  Notify: Provider  Provider Name/Title Zach,PA  Date Provider Notified 02/21/20  Time Provider Notified 1730  Notification Type Page (secure chatted )  Notification Reason Change in status  Response Other (Comment) (new order initiated)  Date of Provider Response 02/21/20  Time of Provider Response 1800

## 2020-02-21 NOTE — Care Management Obs Status (Signed)
[facility] NOTIFICATION   Patient Details  Name: Audrey Duran MRN: [id number] Date of Birth: 1934/10/08   Medicare Observation Status Notification Given:  Yes    Meriel Flavors, LCSW 02/21/2020, 5:30 PM

## 2020-02-21 NOTE — Discharge Instructions (Signed)
In addition to included general post-operative instructions for laparoscopic appendectomy,  Diet: Resume home diet.   Activity: No heavy lifting >20 pounds (children, pets, laundry, garbage) for 4weeks, but light activity and walking are encouraged. Do not drive or drink alcohol if taking narcotic pain medications or having pain that might distract from driving.  Wound care: IF YOU CAN WATERPROOF DRAIN SITE, 2 days after surgery (07/29), you may shower/get incision wet with soapy water and pat dry (do not rub incisions), but no baths or submerging incision underwater until follow-up.   Medications: Resume all home medications except . For mild to moderate pain: acetaminophen (Tylenol) or ibuprofen/naproxen (if no kidney disease). Combining Tylenol with alcohol can substantially increase your risk of causing liver disease. Narcotic pain medications, if prescribed, can be used for severe pain, though may cause nausea, constipation, and drowsiness. Do not combine Tylenol and Percocet (or similar) within a 6 hour period as Percocet (and similar) contain(s) Tylenol. If you do not need the narcotic pain medication, you do not need to fill the prescription.  Call office [phone] / [phone]) at any time if any questions, worsening pain, fevers/chills, bleeding, drainage from incision site, or other concerns.

## 2020-02-21 NOTE — Progress Notes (Signed)
[facility] Hospital Day(s): 0.   Post op day(s): 1 Day Post-Op.   Interval History:  Patient seen and examined no acute events or new complaints overnight. Patient reports she is feeling better, some soreness in the RLQ, but overall improved No fever, chills, nausea, or emesis Slight reactive leukocytosis to 11K Renal function slightly bumped, likely volume depleted JP drain with 140 ccs, likely seropurulent Advanced to regular diet, no issues Worked with PT; recommending HHPT   Vital signs in last 24 hours: [min-max] current  Temp:  [97.1 F (36.2 C)-99.3 F (37.4 C)] 97.7 F (36.5 C) (07/28 0511) Pulse Rate:  [62-92] 62 (07/28 0511) Resp:  [16-25] 20 (07/28 0511) BP: (101-121)/(33-75) 121/38 (07/28 0511) SpO2:  [85 %-100 %] 95 % (07/28 0511)     Height: 5\' 3"  (160 cm) Weight: 55.8 kg BMI (Calculated): 21.79   Intake/Output last 2 shifts:  07/27 0701 - 07/28 0700 In: 2632.7 [I.V.:2482.7; IV Piggyback:150] Out: 140 [Drains:140]   Physical Exam:  Constitutional: Well appearing female, NAD Pulmonary: Normal effort, no respiratory distress Gastrointestinal: Soft, incisional soreness, non-distended, no rebound/guarding. Surgical drain in suprapubic region with seropurulent Skin: Laparoscopic incisions are CDI with dermabond, no erythema or drainage   Labs:  CBC Latest Ref Rng & Units 02/21/2020 02/20/2020 02/19/2020  WBC 4.0 - 10.5 K/uL 11.2(H) 10.0 14.6(H)  Hemoglobin 12.0 - 15.0 g/dL 10.4(L) 12.1 12.0  Hematocrit 36 - 46 % 32.9(L) 38.1 36.9  Platelets 150 - 400 K/uL 155 208 212   CMP Latest Ref Rng & Units 02/21/2020 02/20/2020 02/19/2020  Glucose 70 - 99 mg/dL 117(H) 154(H) 154(H)  BUN 8 - 23 mg/dL 21 15 15   Creatinine 0.44 - 1.00 mg/dL 1.14(H) 0.91 1.02(H)  Sodium 135 - 145 mmol/L 139 137 137  Potassium 3.5 - 5.1 mmol/L 4.0 3.9 3.7  Chloride 98 - 111 mmol/L 106 104 103  CO2 22 - 32 mmol/L 25 22 25   Calcium 8.9 - 10.3 mg/dL  8.3(L) 9.4 9.4  Total Protein 6.5 - 8.1 g/dL - 6.6 6.8  Total Bilirubin 0.3 - 1.2 mg/dL - 1.1 0.8  Alkaline Phos 38 - 126 U/L - 45 48  AST 15 - 41 U/L - 15 19  ALT 0 - 44 U/L - 12 13     Imaging studies: No new pertinent imaging studies   Assessment/Plan:  85 y.o. female 1 Day Post-Op s/p laparoscopic appendectomy for acute appendicitis.   - Continue diet  - Continue IVF resuscitation; monitor BMP  - Monitor leukocytosis  - Pain control prn; antiemetics prn  - Continue IV ABx; transition to PO for home  - medical management of comorbidities; home medications   - Mobilization; ordered [facility] per PT recommendations     - Discharge Planning: Home in the AM  All of the above findings and recommendations were discussed with the patient, patient's family, and the medical team, and all of patient's questions were answered to her expressed satisfaction.  -- Edison Simon, PA-C [facility] 02/21/2020, 1:29 PM [phone] M-F: 7am - 4pm

## 2020-02-22 ENCOUNTER — Telehealth: Payer: Self-pay | Admitting: Internal Medicine

## 2020-02-22 ENCOUNTER — Telehealth: Payer: Self-pay | Admitting: Surgery

## 2020-02-22 DIAGNOSIS — I959 Hypotension, unspecified: Secondary | ICD-10-CM | POA: Diagnosis not present

## 2020-02-22 DIAGNOSIS — J449 Chronic obstructive pulmonary disease, unspecified: Secondary | ICD-10-CM | POA: Diagnosis present

## 2020-02-22 DIAGNOSIS — K3580 Unspecified acute appendicitis: Secondary | ICD-10-CM | POA: Diagnosis present

## 2020-02-22 DIAGNOSIS — N183 Chronic kidney disease, stage 3 unspecified: Secondary | ICD-10-CM | POA: Diagnosis present

## 2020-02-22 DIAGNOSIS — I2510 Atherosclerotic heart disease of native coronary artery without angina pectoris: Secondary | ICD-10-CM | POA: Diagnosis present

## 2020-02-22 DIAGNOSIS — I252 Old myocardial infarction: Secondary | ICD-10-CM | POA: Diagnosis not present

## 2020-02-22 DIAGNOSIS — I480 Paroxysmal atrial fibrillation: Secondary | ICD-10-CM | POA: Diagnosis present

## 2020-02-22 DIAGNOSIS — E785 Hyperlipidemia, unspecified: Secondary | ICD-10-CM | POA: Diagnosis present

## 2020-02-22 DIAGNOSIS — E869 Volume depletion, unspecified: Secondary | ICD-10-CM | POA: Diagnosis not present

## 2020-02-22 DIAGNOSIS — I255 Ischemic cardiomyopathy: Secondary | ICD-10-CM | POA: Diagnosis present

## 2020-02-22 DIAGNOSIS — R339 Retention of urine, unspecified: Secondary | ICD-10-CM | POA: Diagnosis not present

## 2020-02-22 DIAGNOSIS — Z20822 Contact with and (suspected) exposure to covid-19: Secondary | ICD-10-CM | POA: Diagnosis present

## 2020-02-22 DIAGNOSIS — Z90710 Acquired absence of both cervix and uterus: Secondary | ICD-10-CM | POA: Diagnosis not present

## 2020-02-22 DIAGNOSIS — K3532 Acute appendicitis with perforation and localized peritonitis, without abscess: Secondary | ICD-10-CM | POA: Diagnosis present

## 2020-02-22 DIAGNOSIS — Z7982 Long term (current) use of aspirin: Secondary | ICD-10-CM | POA: Diagnosis not present

## 2020-02-22 DIAGNOSIS — E876 Hypokalemia: Secondary | ICD-10-CM | POA: Diagnosis not present

## 2020-02-22 DIAGNOSIS — Z79899 Other long term (current) drug therapy: Secondary | ICD-10-CM | POA: Diagnosis not present

## 2020-02-22 DIAGNOSIS — F411 Generalized anxiety disorder: Secondary | ICD-10-CM | POA: Diagnosis present

## 2020-02-22 DIAGNOSIS — Z7983 Long term (current) use of bisphosphonates: Secondary | ICD-10-CM | POA: Diagnosis not present

## 2020-02-22 DIAGNOSIS — Z8249 Family history of ischemic heart disease and other diseases of the circulatory system: Secondary | ICD-10-CM | POA: Diagnosis not present

## 2020-02-22 DIAGNOSIS — N179 Acute kidney failure, unspecified: Secondary | ICD-10-CM | POA: Diagnosis not present

## 2020-02-22 LAB — CBC
HCT: 27 % — ABNORMAL LOW (ref 36.0–46.0)
Hemoglobin: 8.9 g/dL — ABNORMAL LOW (ref 12.0–15.0)
MCH: 31.9 pg (ref 26.0–34.0)
MCHC: 33 g/dL (ref 30.0–36.0)
MCV: 96.8 fL (ref 80.0–100.0)
Platelets: 141 10*3/uL — ABNORMAL LOW (ref 150–400)
RBC: 2.79 MIL/uL — ABNORMAL LOW (ref 3.87–5.11)
RDW: 13.4 % (ref 11.5–15.5)
WBC: 11.2 10*3/uL — ABNORMAL HIGH (ref 4.0–10.5)
nRBC: 0 % (ref 0.0–0.2)

## 2020-02-22 LAB — BASIC METABOLIC PANEL
Anion gap: 9 (ref 5–15)
BUN: 23 mg/dL (ref 8–23)
CO2: 23 mmol/L (ref 22–32)
Calcium: 7.4 mg/dL — ABNORMAL LOW (ref 8.9–10.3)
Chloride: 104 mmol/L (ref 98–111)
Creatinine, Ser: 1.31 mg/dL — ABNORMAL HIGH (ref 0.44–1.00)
GFR calc Af Amer: 44 mL/min — ABNORMAL LOW (ref >60)
GFR calc non Af Amer: 38 mL/min — ABNORMAL LOW (ref >60)
Glucose, Bld: 94 mg/dL (ref 70–99)
Potassium: 3.4 mmol/L — ABNORMAL LOW (ref 3.5–5.1)
Sodium: 136 mmol/L (ref 135–145)

## 2020-02-22 LAB — SURGICAL PATHOLOGY

## 2020-02-22 MED ORDER — ENOXAPARIN SODIUM 30 MG/0.3ML ~~LOC~~ SOLN
30.0000 mg | SUBCUTANEOUS | Status: DC
  Administered 2020-02-22: 30 mg via SUBCUTANEOUS
  Filled 2020-02-22: qty 0.3

## 2020-02-22 MED ORDER — POTASSIUM CHLORIDE CRYS ER 20 MEQ PO TBCR
20.0000 meq | EXTENDED_RELEASE_TABLET | Freq: Two times a day (BID) | ORAL | Status: DC
  Administered 2020-02-22 – 2020-02-23 (×3): 20 meq via ORAL
  Filled 2020-02-22 (×3): qty 1

## 2020-02-22 NOTE — Progress Notes (Addendum)
[facility] Day(s): 0.   Post op day(s): 2 Days Post-Op.   Interval History:  Patient seen and examined She continues to have issues with urinary retention and hypotension Patient reports she is feeling better this morning Abdominal soreness improved No nausea or emesis Stable leukocytosis to 11K, no fevers Has a slightly worse AKI this morning with sCr - 1.31, UO - 300ccs Mild hypokalemia to 3.4 Surgical drain with 105 ccs out, serous Tolerating regular diet Mobilizing well: PT following for family concerns, recommending HHPT  She endorses no history of urinary retention in the past however she did require catheterization pre-operatively due to the inability to urinate which yielded 1000 ccs   Vital signs in last 24 hours: [min-max] current  Temp:  [97.4 F (36.3 C)-98.3 F (36.8 C)] 97.4 F (36.3 C) (07/29 0421) Pulse Rate:  [57-67] 57 (07/29 0421) Resp:  [18-24] 24 (07/29 0421) BP: (80-97)/(37-62) 94/44 (07/29 0421) SpO2:  [86 %-95 %] 95 % (07/29 0421)     Height: 5\' 3"  (160 cm) Weight: 55.8 kg BMI (Calculated): 21.79   Intake/Output last 2 shifts:  07/28 0701 - 07/29 0700 In: 1215.5 [P.O.:480; I.V.:663.1; IV Piggyback:72.4] Out: 455 [Urine:350; Drains:105]   Physical Exam:  Constitutional: alert, cooperative and no distress  Respiratory: breathing non-labored at rest, on [place] Cardiovascular: regular rate and sinus rhythm  Gastrointestinal: soft, non-tender, and non-distended, surgical drain in suprapubic region with serous output Integumentary: Laparoscopic incisions are CDI with dermabond, no erythema or drainage  Labs:  CBC Latest Ref Rng & Units 02/22/2020 02/21/2020 02/20/2020  WBC 4.0 - 10.5 K/uL 11.2(H) 11.2(H) 10.0  Hemoglobin 12.0 - 15.0 g/dL 8.9(L) 10.4(L) 12.1  Hematocrit 36 - 46 % 27.0(L) 32.9(L) 38.1  Platelets 150 - 400 K/uL 141(L) 155 208   CMP Latest Ref Rng & Units 02/22/2020 02/21/2020 02/20/2020   Glucose 70 - 99 mg/dL 94 117(H) 154(H)  BUN 8 - 23 mg/dL 23 21 15   Creatinine 0.44 - 1.00 mg/dL 1.31(H) 1.14(H) 0.91  Sodium 135 - 145 mmol/L 136 139 137  Potassium 3.5 - 5.1 mmol/L 3.4(L) 4.0 3.9  Chloride 98 - 111 mmol/L 104 106 104  CO2 22 - 32 mmol/L 23 25 22   Calcium 8.9 - 10.3 mg/dL 7.4(L) 8.3(L) 9.4  Total Protein 6.5 - 8.1 g/dL - - 6.6  Total Bilirubin 0.3 - 1.2 mg/dL - - 1.1  Alkaline Phos 38 - 126 U/L - - 45  AST 15 - 41 U/L - - 15  ALT 0 - 44 U/L - - 12     Imaging studies: No new pertinent imaging studies   Assessment/Plan:  85 y.o. female with AKI and urinary retention, suspect she may have some degree of volume depletion as well, 2 Days Post-Op s/p laparoscopic appendectomy for acute appendicitis   - Continue regular diet  - IVF resuscitation; increased to 125 ml/hr   - Will place foley to monitor UO, will discharge home with this; I discussed with urology and will arrange voiding trial in 1 week as outpatient   - Monitor leukocytosis, BMP             - Pain control prn; antiemetics prn             - Continue IV ABx; transition to PO for home             - medical management of comorbidities; home medications --> HOLD BP Medications 2/2 hypotension             -  Mobilization; ordered HH per PT recommendations    - DVT prophylaxis     - Discharge planning; Tentatively plan for discharge tomorrow morning pending clinical condition  All of the above findings and recommendations were discussed with the patient, and the medical team, and all of patient's questions were answered to her expressed satisfaction.  -- Edison Simon, PA-C [facility] 02/22/2020, 8:01 AM [phone] M-F: 7am - 4pm

## 2020-02-22 NOTE — Telephone Encounter (Signed)
Pt's daughter-in-law, Audrey Duran, called requesting to spk w/Dr. Dahlia Byes to discuss some concerns she has prior to the pt being discharged from [facility] post lap appendectomy on 02/20/20 (currently in rm 202).  Audrey Duran states the pt has a JP drain in place & has not been released yet due to lack of urine output, however she has started to produce urine, so it is just a matter of time before she is discharged.  Audrey Duran verbalized concerns that the pt will not be able to manage the JP drain once released, as she is better when in an controlled environment.  Although the pt seems to be in her right mind now, she was committed to [facility] for psych eval 3 wks prior.  For starters, Audrey Duran mentions that she see's numerous disturbing images @ random times. The family is having difficulty getting guardianship & fears it will be even harder once released if specific blood work is not drawn/completed.  Audrey Duran is aware that all of her concerns will be noted & fwd to Dr. Dahlia Byes, however she is not identified on the pt's DPR, so any further communication will have to be w/Audrey Duran @ [phone].  She does ask that if a call is made to his # & he does not respond, please call her @ [phone] so that she can call him & either conference everyone together, or @ least let her notify Audrey Duran that the call is from the office/provider so that he will answer.  Thank you

## 2020-02-22 NOTE — Telephone Encounter (Signed)
Patient son called with concerns patient being DC home with JP drain after appendectomy. That with her having delusional thoughts and hallucinations that she will not be able to take care of herself and the drain. Advised that he needs to speak with the hospitalist and the case manager, along with a Social worker prior to  DC for concerns. Patient son ask why patient has not seen a neurologist or psychiatry advised son on DPR that these referrals have been placed one in March of 2021 and one as recent as 01/2020. Patient son ask if PCP could order test while patient hospitalized advised primary does not go into the hospital setting. Advised and re-enforced families concerns need to be addressed prior to DC with hospitalist and case manager that Primary would more than likely only be able to offer home health for JP drain and this would require face to face visit. Son stated he would speak with hospitalist with the families concerns.

## 2020-02-23 ENCOUNTER — Other Ambulatory Visit: Payer: Self-pay

## 2020-02-23 LAB — CBC
HCT: 29.6 % — ABNORMAL LOW (ref 36.0–46.0)
Hemoglobin: 9.4 g/dL — ABNORMAL LOW (ref 12.0–15.0)
MCH: 31.1 pg (ref 26.0–34.0)
MCHC: 31.8 g/dL (ref 30.0–36.0)
MCV: 98 fL (ref 80.0–100.0)
Platelets: 145 10*3/uL — ABNORMAL LOW (ref 150–400)
RBC: 3.02 MIL/uL — ABNORMAL LOW (ref 3.87–5.11)
RDW: 13.3 % (ref 11.5–15.5)
WBC: 11.7 10*3/uL — ABNORMAL HIGH (ref 4.0–10.5)
nRBC: 0 % (ref 0.0–0.2)

## 2020-02-23 LAB — BASIC METABOLIC PANEL
Anion gap: 6 (ref 5–15)
BUN: 20 mg/dL (ref 8–23)
CO2: 21 mmol/L — ABNORMAL LOW (ref 22–32)
Calcium: 8.1 mg/dL — ABNORMAL LOW (ref 8.9–10.3)
Chloride: 109 mmol/L (ref 98–111)
Creatinine, Ser: 1.09 mg/dL — ABNORMAL HIGH (ref 0.44–1.00)
GFR calc Af Amer: 55 mL/min — ABNORMAL LOW (ref >60)
GFR calc non Af Amer: 47 mL/min — ABNORMAL LOW (ref >60)
Glucose, Bld: 87 mg/dL (ref 70–99)
Potassium: 4 mmol/L (ref 3.5–5.1)
Sodium: 136 mmol/L (ref 135–145)

## 2020-02-23 MED ORDER — ENOXAPARIN SODIUM 40 MG/0.4ML ~~LOC~~ SOLN
40.0000 mg | SUBCUTANEOUS | Status: DC
  Administered 2020-02-23: 40 mg via SUBCUTANEOUS
  Filled 2020-02-23: qty 0.4

## 2020-02-23 MED ORDER — LOSARTAN POTASSIUM 25 MG PO TABS: 25.0000 mg | ORAL_TABLET | Freq: Every day | ORAL | 1 refills | Status: DC

## 2020-02-23 NOTE — Discharge Summary (Signed)
[facility] SURGICAL DISCHARGE SUMMARY  Patient ID: Audrey Duran MRN: [id number] DOB/AGE: 83-Dec-1936 83 y.o.  Admit date: 02/20/2020 Discharge date: 02/23/2020  Discharge Diagnoses Patient Active Problem List   Diagnosis Date Noted  . Acute appendicitis 02/20/2020    Consultants None  Procedures 02/20/2020: Laparoscopic Appendectomy  HPI: 85 y.o. female initially presented to the ED last night but left prior to being seen for abdominal pain, but the pain persisted to this morning and prompted her re-presentation. She reports around a 3 day history of progressively worsening RLQ abdominal pain which she described as sharp in nature. This is now a 10/10 in severity. She did not try anything at home for this. No history of similar in the past. She reports associated subjective fever, nausea, emesis, and diarrhea with the pain. No chills, cough, CP, SOB, or urinary changes. Only previous surgical history is abdominal hysterectomy. Of note, she has a history of rate controlled atrial fibrillation and she is not on any anti-coagulation. Work up in the ED yesterday revealed a leukocytosis to 14K but this was resolved this morning. The remainder of her laboratory work up was reassuring. CT Abdomen/Pelvis was concerning for acute uncomplicated appendicitis without abscess or pneumoperitoneum.  Hospital Course: Informed consent was obtained and documented, and patient underwent uneventful laparoscopic appendectomy (Dr Dahlia Byes, 02/20/2020).  Post-operatively, patient did have some issues with urinary retention and AKI, likely secondary to volume depletion, but these resolved. Advancement of patient's diet and ambulation were well-tolerated. The remainder of patient's hospital course was essentially unremarkable, and discharge planning was initiated accordingly with patient safely able to be discharged home with appropriate discharge instructions, antibiotics (Augmentin x 10 days),  pain control, and outpatient follow-up after all of her questions were answered to her expressed satisfaction.   Discharge Condition: Good   Physical Examination:  Constitutional: alert, cooperative and no distress  Respiratory: breathing non-labored at rest, on [place] Cardiovascular: regular rate and sinus rhythm  Gastrointestinal: soft, non-tender, and non-distended, surgical drain in suprapubic region with serous output Integumentary: Laparoscopic incisions are CDI with dermabond, no erythema or drainage   Allergies as of 02/23/2020      Reactions   Azithromycin    GI upset   Clarithromycin    ? Rash   Diclofenac    ? Rash   Lisinopril Other (See Comments)   hyperkalemia   Meloxicam    ? Rash   Nitrofurantoin Monohyd Macro    ? rash   Zocor [simvastatin]    Penicillins Rash      Medication List    TAKE these medications   acyclovir 400 MG tablet Commonly known as: ZOVIRAX Take 1 tablet (400 mg total) by mouth daily.   alendronate 70 MG tablet Commonly known as: FOSAMAX Take 1 tablet (70 mg total) by mouth every 7 (seven) days. Take with a full glass of water on an empty stomach.   ALPRAZolam 0.5 MG tablet Commonly known as: XANAX TAKE 1 TABLET BY MOUTH AT BEDTIME AS NEEDED FOR SLEEP What changed:   reasons to take this  additional instructions   amoxicillin-clavulanate 875-125 MG tablet Commonly known as: Augmentin Take 1 tablet by mouth 2 (two) times daily for 10 days.   aspirin 81 MG tablet Take 81 mg by mouth at bedtime.   cyanocobalamin 1000 MCG tablet Take one by mouth daily   DULoxetine 20 MG capsule Commonly known as: CYMBALTA Take 20 mg by mouth daily.   Feosol 45 MG Tabs tablet Generic drug:  carbonyl iron Take one by mouth daily   Fish Oil 1200 MG Caps Take 1 capsule by mouth daily. Take one by mouth daily   furosemide 20 MG tablet Commonly known as: LASIX Take 0.5 tablets (10 mg total) by mouth daily.   isosorbide mononitrate 60 MG  24 hr tablet Commonly known as: IMDUR Take 1 tablet (60 mg total) by mouth daily.   losartan 25 MG tablet Commonly known as: COZAAR Take 1 tablet (25 mg total) by mouth daily.   magnesium oxide 400 MG tablet Commonly known as: MAG-OX Take 400 mg by mouth daily.   meclizine 50 MG tablet Commonly known as: ANTIVERT Take 50 mg by mouth 3 (three) times daily as needed.   oxyCODONE 5 MG immediate release tablet Commonly known as: Oxy IR/ROXICODONE Take 1 tablet (5 mg total) by mouth every 6 (six) hours as needed for severe pain or breakthrough pain.   pravastatin 80 MG tablet Commonly known as: PRAVACHOL Take 1 tablet (80 mg total) by mouth daily.   risperiDONE 1 MG tablet Commonly known as: RISPERDAL Take 1 tablet (1 mg total) by mouth at bedtime.   timolol 0.5 % ophthalmic solution Commonly known as: TIMOPTIC Place 1 drop into both eyes 2 (two) times daily.   Vitamin D3 75 MCG (3000 UT) Tabs Take 1 tablet by mouth daily. Take one by mouth daily         Follow-up Information    Tylene Fantasia, PA-C. Schedule an appointment as soon as possible for a visit in 2 week(s).   Specialty: Physician Assistant Why: s/p laparoscopic appendectomy Contact information: [address] [place] [address] [phone]                Time spent on discharge management including discussion of hospital course, clinical condition, outpatient instructions, prescriptions, and follow up with the patient and members of the medical team: >30 minutes  -- Edison Simon , PA-C [facility]  02/23/2020, 8:35 AM [phone] M-F: 7am - 4pm

## 2020-02-23 NOTE — TOC Transition Note (Signed)
Transition of Care [facility]) - CM/SW Discharge Note   Patient Details  Name: Audrey Duran MRN: [id number] Date of Birth: Dec 05, 1934  [facility] CM/SW Contact:  Eileen Stanford, LCSW Phone Number: 02/23/2020, 12:22 PM   Clinical Narrative:   HH is arranged through [facility]. Pt's daughter is aware. Adela Lank will transport pt home. NO additional needs at this time.    Final next level of care: Home w Home Health Services Barriers to Discharge: No Barriers Identified   Patient Goals and CMS Choice        Discharge Placement                  Name of family member notified: daughter aware Patient and family notified of of transfer: 02/23/20  Discharge Plan and Services                          HH Arranged: RN, PT, OT, Social Work [employer] Agency: [place] [facility]) Date [place]: 02/23/20 Time [place]: 1222 Representative spoke with at [place]: Corene Cornea  Social Determinants of Health ([place]) Interventions     Readmission Risk Interventions No flowsheet data found.

## 2020-02-23 NOTE — Progress Notes (Signed)
Physical Therapy Treatment Patient Details Name: Audrey Duran MRN: [id number] DOB: [birth year] Today's Date: 02/23/2020    History of Present Illness 85 y.o. female initially presented to the ED last night but left prior to being seen for abdominal pain, but the pain persisted to this morning and prompted her re-presentation. She reports around a 3 day history of progressively worsening RLQ abdominal pain which she described as sharp in nature. This is now a 10/10 in severity. She did not try anything at home for this. No history of similar in the past. She reports associated subjective fever, nausea, emesis, and diarrhea with the pain. No chills, cough, CP, SOB, or urinary changes. Only previous surgical history is abdominal hysterectomy. Of note, she has a history of rate controlled atrial fibrillation and she is not on any anti-coagulation. Work up in the ED yesterday revealed a leukocytosis to 14K but this was resolved this morning. The remainder of her laboratory work up was reassuring. CT Abdomen/Pelvis was concerning for acute uncomplicated appendicitis without abscess or pneumoperitoneum.    PT Comments    Pt was seated on BSC having BM upon therapist arriving. She has small BM and is agreeable to PT session. Therapist discussed POC and expectations. She was able to stand and ambulate without difficulty. Ambulated to stairwell for stairs training however once in stairwell pt refused. Therapist demonstrated safe technique to pt and she states confidence in her abiltiies to safely perform at home. Pt is planning to DC home today with family support. At conclusion of session, pt was seated up in recliner with call bell in reach and RN tech in headed to room to assist with DC prep.    Follow Up Recommendations  Home health PT     Equipment Recommendations  Rolling walker with 5" wheels    Recommendations for Other Services       Precautions / Restrictions Precautions Precautions:  None Restrictions Weight Bearing Restrictions: No    Mobility  Bed Mobility               General bed mobility comments: not formally tested. pt was seated on BSC upon entering room in in recliner post session  Transfers Overall transfer level: Modified independent Equipment used: Rolling walker (2 wheeled)             General transfer comment: no assistance for standing/sitting  Ambulation/Gait Ambulation/Gait assistance: Supervision Gait Distance (Feet): 150 Feet Assistive device: Rolling walker (2 wheeled) Gait Pattern/deviations: Step-through pattern;Trunk flexed Gait velocity: decreased   General Gait Details: no LOB or unsteadiness with ambulation with RW   Stairs Stairs: Yes       General stair comments: Pt ambulated to stair well but once looking at stairs refused to trial. theraopist demonstarted stairs for correct performance to maximize safety   Wheelchair Mobility    Modified Rankin (Stroke Patients Only)       Balance                                            Cognition Arousal/Alertness: Awake/alert Behavior During Therapy: WFL for tasks assessed/performed Overall Cognitive Status: Within Functional Limits for tasks assessed                                 General Comments: Pt is Alert and  oriented x 3      Exercises      General Comments        Pertinent Vitals/Pain Pain Assessment: No/denies pain Faces Pain Scale: No hurt    Home Living                      Prior Function            PT Goals (current goals can now be found in the care plan section) Acute Rehab PT Goals Patient Stated Goal: no goals stated Progress towards PT goals: Progressing toward goals    Frequency    Min 2X/week      PT Plan Current plan remains appropriate    Co-evaluation              AM-PAC PT "6 Clicks" Mobility   Outcome Measure  Help needed turning from your back to your side  while in a flat bed without using bedrails?: None Help needed moving from lying on your back to sitting on the side of a flat bed without using bedrails?: None Help needed moving to and from a bed to a chair (including a wheelchair)?: None Help needed standing up from a chair using your arms (e.g., wheelchair or bedside chair)?: None Help needed to walk in hospital room?: A Little Help needed climbing 3-5 steps with a railing? : A Little 6 Click Score: 22    End of Session Equipment Utilized During Treatment: Gait belt;Other (comment) (pt on rm air throughout session) Activity Tolerance: Patient tolerated treatment well Patient left: in chair;with call bell/phone within reach;with nursing/sitter in room Nurse Communication: Mobility status PT Visit Diagnosis: Muscle weakness (generalized) (M62.81);Difficulty in walking, not elsewhere classified (R26.2)     Time: 0940-1000 PT Time Calculation (min) (ACUTE ONLY): 20 min  Charges:  $Gait Training: 8-22 mins                    Julaine Fusi PTA 02/23/20, 10:28 AM

## 2020-02-23 NOTE — Progress Notes (Signed)
PHARMACIST - PHYSICIAN COMMUNICATION  CONCERNING:  Enoxaparin (Lovenox) for DVT Prophylaxis    RECOMMENDATION: Patient was prescribed enoxaprin 30mg  q24 hours for VTE prophylaxis.   Filed Weights   02/20/20 0708  Weight: 55.8 kg (123 lb)    Body mass index is 21.79 kg/m.  Estimated Creatinine Clearance: 32.9 mL/min (A) (by C-G formula based on SCr of 1.09 mg/dL (H)).  Patient is candidate for enoxaparin 40mg  every 24 hours based on CrCl >19ml/min  DESCRIPTION: Pharmacy has adjusted enoxaparin dose per [facility] policy.  Patient is now receiving enoxaparin 40mg  every 24 hours.   Lu Duffel, PharmD Clinical Pharmacist  02/23/2020 7:48 AM

## 2020-02-23 NOTE — Plan of Care (Signed)
Discharge order received. Patient mental status is at baseline. Vital signs stable . No signs of acute distress. Discharge instructions given.  Patient verbalized understanding. JP drain was removed.No other issues noted at this time.

## 2020-02-26 ENCOUNTER — Encounter: Payer: Self-pay | Admitting: Nurse Practitioner

## 2020-02-26 ENCOUNTER — Encounter: Payer: PPO | Admitting: Physician Assistant

## 2020-02-26 ENCOUNTER — Ambulatory Visit (INDEPENDENT_AMBULATORY_CARE_PROVIDER_SITE_OTHER): Payer: PPO | Admitting: Nurse Practitioner

## 2020-02-26 ENCOUNTER — Other Ambulatory Visit: Payer: Self-pay

## 2020-02-26 VITALS — BP 168/80 | HR 85 | Temp 98.7°F | Ht 63.0 in | Wt 138.4 lb

## 2020-02-26 DIAGNOSIS — Z9049 Acquired absence of other specified parts of digestive tract: Secondary | ICD-10-CM

## 2020-02-26 DIAGNOSIS — N183 Chronic kidney disease, stage 3 unspecified: Secondary | ICD-10-CM | POA: Diagnosis not present

## 2020-02-26 DIAGNOSIS — D649 Anemia, unspecified: Secondary | ICD-10-CM | POA: Diagnosis not present

## 2020-02-26 DIAGNOSIS — Z09 Encounter for follow-up examination after completed treatment for conditions other than malignant neoplasm: Secondary | ICD-10-CM

## 2020-02-26 DIAGNOSIS — R531 Weakness: Secondary | ICD-10-CM

## 2020-02-26 MED ORDER — METOLAZONE 5 MG PO TABS: 5.0000 mg | ORAL_TABLET | Freq: Every day | ORAL | 0 refills | Status: DC

## 2020-02-26 NOTE — Progress Notes (Signed)
Established Patient Office Visit  Subjective:  Patient ID: Audrey Duran, female    DOB: 1935/02/26  Age: 85 y.o. MRN: [id number]  CC:  Chief Complaint  Patient presents with  . Acute Visit    stomach swollen and pain    HPI:  Audrey Duran is an 85 year old who comes in for abdominal discomfort, lower extremity edema, and was just discharged from the hospital 3 days ago status post appendectomy 02/20/2020.  She is taking her Augmentin as prescribed.  She has had considerable weight gain.  Blood pressure is elevated today 170/82.  She has no chest pain, shortness of breath, DOE.  She is weak.  Was using a walker Friday and Saturday using her walker today.  Unable to get up on exam table.  She comes in with her sister and who has been  tending to her.  Dressings have been changed regularly and there has been no concern with the surgical sites.  Little serous drainage from her lower abdominal incision.  She has had no fevers or chills.  No dysuria hematuria or bladder pain.  She has been eating and drinking well.  Meal  recall is reasonable.  She could have higher calories and higher protein for healing.  Recent hospital admission, discharge notes, labs, imaging, medication list, reviewed with the discharge records and what patient is actually taking.  Wt Readings from Last 3 Encounters:  02/26/20 138 lb 6.4 oz (62.8 kg)  02/20/20 123 lb (55.8 kg)  02/19/20 123 lb (55.8 kg)    170/82.  BP Readings from Last 3 Encounters:  02/26/20 (!) 168/80  02/23/20 (!) 139/50  02/19/20 (!) 112/61    Past Medical History:  Diagnosis Date  . Anemia   . Atrial fibrillation ([place])   . COPD (chronic obstructive pulmonary disease) ([place])   . Coronary artery disease 1995   s/p AMI , no history of stents,  [name]  . Hyperlipidemia   . Ischemic cardiomyopathy Dec 2011   ETT Sestamibi study apical scar, no ischemia, [name]  . Myocardial infarction ([name])   . Recurrent HSV (herpes simplex  virus)     Past Surgical History:  Procedure Laterality Date  . ABDOMINAL HYSTERECTOMY  june 2014  . BREAST EXCISIONAL BIOPSY Left 1990's   neg  . 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 CATARACT EXTRACTION  2004, 2006  . JOINT REPLACEMENT  July 2013   total hip , [facility]  . LAPAROSCOPIC APPENDECTOMY N/A 02/20/2020   Procedure: APPENDECTOMY LAPAROSCOPIC;  Surgeon: Jules Husbands, MD;  Location: [facility] ORS;  Service: General;  Laterality: N/A;    Family History  Problem Relation Age of Onset  . Cancer Mother 52       Pancreatic   . Heart disease Father   . Cancer Sister 56       Breast Cancer  . Breast cancer Sister 54    Social History   Socioeconomic History  . Marital status: Divorced    Spouse name: Not on file  . Number of children: Not on file  . Years of education: Not on file  . Highest education level: Not on file  Occupational History  . Not on file  Tobacco Use  . Smoking status: Former Smoker    Quit date: 01/13/1992    Years since quitting: 28.1  . Smokeless tobacco: Never Used  Substance and Sexual Activity  . Alcohol use: No    Alcohol/week: 0.0 standard drinks  .  Drug use: No  . Sexual activity: Not Currently  Other Topics Concern  . Not on file  Social History Narrative  . Not on file   Social Determinants of Health   Financial Resource Strain:   . Difficulty of Paying Living Expenses:   Food Insecurity:   . Worried About Charity fundraiser in the Last Year:   . Arboriculturist in the Last Year:   Transportation Needs:   . Film/video editor (Medical):   Marland Kitchen Lack of Transportation (Non-Medical):   Physical Activity:   . Days of Exercise per Week:   . Minutes of Exercise per Session:   Stress:   . Feeling of Stress :   Social Connections:   . Frequency of Communication with Friends and Family:   . Frequency of Social Gatherings with Friends and Family:   . Attends Religious Services:   . Active Member of Clubs or  Organizations:   . Attends Archivist Meetings:   Marland Kitchen Marital Status:   Intimate Partner Violence:   . Fear of Current or Ex-Partner:   . Emotionally Abused:   Marland Kitchen Physically Abused:   . Sexually Abused:     Outpatient Medications Prior to Visit  Medication Sig Dispense Refill  . acyclovir (ZOVIRAX) 400 MG tablet Take 1 tablet (400 mg total) by mouth daily. 90 tablet 1  . alendronate (FOSAMAX) 70 MG tablet Take 1 tablet (70 mg total) by mouth every 7 (seven) days. Take with a full glass of water on an empty stomach. 12 tablet 3  . ALPRAZolam (XANAX) 0.5 MG tablet TAKE 1 TABLET BY MOUTH AT BEDTIME AS NEEDED FOR SLEEP (Patient taking differently: Take 0.5 mg by mouth at bedtime as needed for sleep. ) 30 tablet 1  . aspirin 81 MG tablet Take 81 mg by mouth at bedtime.     . carbonyl iron (FEOSOL) 45 MG TABS Take one by mouth daily    . Cholecalciferol (VITAMIN D3) 3000 UNITS TABS Take 1 tablet by mouth daily. Take one by mouth daily 90 tablet 3  . cyanocobalamin 1000 MCG tablet Take one by mouth daily     . DULoxetine (CYMBALTA) 20 MG capsule Take 20 mg by mouth daily.     . furosemide (LASIX) 20 MG tablet Take 0.5 tablets (10 mg total) by mouth daily. 45 tablet 1  . isosorbide mononitrate (IMDUR) 60 MG 24 hr tablet Take 1 tablet (60 mg total) by mouth daily. 90 tablet 1  . losartan (COZAAR) 25 MG tablet Take 1 tablet (25 mg total) by mouth daily. 90 tablet 1  . magnesium oxide (MAG-OX) 400 MG tablet Take 400 mg by mouth daily.      . meclizine (ANTIVERT) 50 MG tablet Take 50 mg by mouth 3 (three) times daily as needed.     . Omega-3 Fatty Acids (FISH OIL) 1200 MG CAPS Take 1 capsule by mouth daily. Take one by mouth daily     . oxyCODONE (OXY IR/ROXICODONE) 5 MG immediate release tablet Take 1 tablet (5 mg total) by mouth every 6 (six) hours as needed for severe pain or breakthrough pain. 25 tablet 0  . pravastatin (PRAVACHOL) 80 MG tablet Take 1 tablet (80 mg total) by mouth daily. 90  tablet 1  . risperiDONE (RISPERDAL) 1 MG tablet Take 1 tablet (1 mg total) by mouth at bedtime. 30 tablet 2  . timolol (TIMOPTIC) 0.5 % ophthalmic solution Place 1 drop into both eyes 2 (  two) times daily.     Marland Kitchen amoxicillin-clavulanate (AUGMENTIN) 875-125 MG tablet Take 1 tablet by mouth 2 (two) times daily for 10 days. 20 tablet 0   No facility-administered medications prior to visit.    Allergies  Allergen Reactions  . Azithromycin     GI upset  . Clarithromycin     ? Rash  . Diclofenac     ? Rash  . Lisinopril Other (See Comments)    hyperkalemia  . Meloxicam     ? Rash  . Nitrofurantoin Monohyd Macro     ? rash  . Zocor [Simvastatin]   . Penicillins Rash    Review of Systems Pertinent positives noted in  history of present illness otherwise negative.   Objective:    Physical Exam Vitals reviewed.  Constitutional:      Appearance: Normal appearance. She is normal weight.  HENT:     Head: Normocephalic.  Cardiovascular:     Rate and Rhythm: Normal rate and regular rhythm.     Pulses: Normal pulses.     Heart sounds: Normal heart sounds.  Pulmonary:     Effort: Pulmonary effort is normal.     Breath sounds: Normal breath sounds.  Abdominal:     General: There is no distension.     Palpations: Abdomen is soft.     Tenderness: There is no abdominal tenderness. There is no guarding or rebound.     Comments: Surgical sites healing first intention and redness/erythema.  Dressing umbilicus clear dressing clear, lower abdomen  with dry gauze- slight serous and the incision  is closed but one very tiny, pinprick size slightly less well approximated area and the source of the serous drainage. The dressing was clean and secured with clear tape -intact. No purulent dischsrge noted. Abd is soft, non distended, but larger for her- suspect fluid, It is sore- as to be expected this soon post op. She is weak and cannot get on the table for exam.   Musculoskeletal:     Cervical back:  Normal range of motion.     Right lower leg: Edema present.     Left lower leg: Edema present.     Comments: +2 pitting edema bilat.  Too weak  in legs to step on stool for exam table.   Skin:    General: Skin is warm and dry.     Findings: Bruising present.  Neurological:     General: No focal deficit present.     Mental Status: She is alert and oriented to person, place, and time.  Psychiatric:        Mood and Affect: Mood normal.        Behavior: Behavior normal.     BP (!) 168/80 (BP Location: Left Arm, Patient Position: Sitting, Cuff Size: Normal)   Pulse 85   Temp 98.7 F (37.1 C) (Oral)   Ht 5\' 3"  (1.6 m)   Wt 138 lb 6.4 oz (62.8 kg)   SpO2 94%   BMI 24.52 kg/m  Wt Readings from Last 3 Encounters:  02/26/20 138 lb 6.4 oz (62.8 kg)  02/20/20 123 lb (55.8 kg)  02/19/20 123 lb (55.8 kg)     Health Maintenance Due  Topic Date Due  . INFLUENZA VACCINE  02/25/2020    There are no preventive care reminders to display for this patient.  Lab Results  Component Value Date   TSH 1.79 08/21/2019   Lab Results  Component Value Date   WBC 11.7 (  H) 02/23/2020   HGB 9.4 (L) 02/23/2020   HCT 29.6 (L) 02/23/2020   MCV 98.0 02/23/2020   PLT 145 (L) 02/23/2020   Lab Results  Component Value Date   NA 136 02/23/2020   K 4.0 02/23/2020   CO2 21 (L) 02/23/2020   GLUCOSE 87 02/23/2020   BUN 20 02/23/2020   CREATININE 1.09 (H) 02/23/2020   BILITOT 1.1 02/20/2020   ALKPHOS 45 02/20/2020   AST 15 02/20/2020   ALT 12 02/20/2020   PROT 6.6 02/20/2020   ALBUMIN 3.6 02/20/2020   CALCIUM 8.1 (L) 02/23/2020   ANIONGAP 6 02/23/2020   GFR 53.00 (L) 02/01/2020   Lab Results  Component Value Date   CHOL 159 10/06/2018   Lab Results  Component Value Date   HDL 51.10 10/06/2018   Lab Results  Component Value Date   LDLCALC 91 10/06/2018   Lab Results  Component Value Date   TRIG 82.0 10/06/2018   Lab Results  Component Value Date   CHOLHDL 3 10/06/2018   Lab  Results  Component Value Date   HGBA1C 5.8 08/21/2019      Assessment & Plan:   Problem List Items Addressed This Visit      Genitourinary   CKD (chronic kidney disease) stage 3, GFR 30-59 ml/min - Primary   Relevant Orders   Basic metabolic panel     Other   Anemia   Relevant Orders   Hepatic function panel   Reticulocytes   IBC + Ferritin   B12 and Folate Panel   S/P laparoscopic appendectomy   Relevant Orders   CBC with Differential/Platelet   Weakness   Relevant Orders   Basic metabolic panel   CBC with Differential/Platelet   Hepatic function panel      Meds ordered this encounter  Medications  . metolazone (ZAROXOLYN) 5 MG tablet    Sig: Take 1 tablet (5 mg total) by mouth daily. Take 30 min before the furosemide one time daily    Dispense:  30 tablet    Refill:  0    Order Specific Question:   Supervising Provider    Answer:   Einar Pheasant [[id number]]   Mrs. Redel presents with her sister just recently discharged from the hospital.  She is weak, and appears to be healing adequately from her recent appendectomy.  She will continue with  Augmentin as prescribed. Please drink plenty of fluids, continue with the yogurt and healthy diet. Change the appendix dressing  as recommended.  Use your walker at home as you are building up strength.  Her weight is up 15 lbs from baseline and her blood pressure is up with more swelling in your lower legs. She is not SOB and lungs are clear.  Pulse ox was a little low at 94%.  Dr. Lupita Dawn examined her and we agreed that she needs to have more diuresis.  We did not want to strain her kidneys with a higher dose of Lasix alone. Zaroxolyn 5 mg was added 30 minutes before Lasix 20 mg daily.  This was reviewed very carefully with the patient and her sister.  They were able to verbalize back this plan of care.  Lab  was closed at this time of visit and so she will come back tomorrow for blood work.     Follow-up:Dr. Derrel Nip will see her  again on Friday.   This visit occurred during the SARS-CoV-2 public health emergency.  Safety protocols were in place, including screening questions  prior to the visit, additional usage of staff PPE, and extensive cleaning of exam room while observing appropriate contact time as indicated for disinfecting solutions.   Denice Paradise, NP

## 2020-02-26 NOTE — Patient Instructions (Addendum)
Please continue with Augmentin as prescribed.   Please drink plenty of fluids, continue with the yogurt and healthy diet.   Change the appendix dressing  as recommended.  Use your walker at home as you are building up strength.  Your BP is elevated today and you have more swelling in your lower legs.   Please take the new Zaroxolyn  tomorrow 30 min before furosemide. The furosemide is now 20 mg daily.   Come in for labs tomorrow. 2:30   Keep appt to see Dr. Derrel Nip on Friday as planned.

## 2020-02-27 ENCOUNTER — Telehealth: Payer: Self-pay | Admitting: Internal Medicine

## 2020-02-27 ENCOUNTER — Other Ambulatory Visit (INDEPENDENT_AMBULATORY_CARE_PROVIDER_SITE_OTHER): Payer: PPO

## 2020-02-27 DIAGNOSIS — Z9049 Acquired absence of other specified parts of digestive tract: Secondary | ICD-10-CM | POA: Insufficient documentation

## 2020-02-27 DIAGNOSIS — I252 Old myocardial infarction: Secondary | ICD-10-CM | POA: Diagnosis not present

## 2020-02-27 DIAGNOSIS — E876 Hypokalemia: Secondary | ICD-10-CM | POA: Diagnosis not present

## 2020-02-27 DIAGNOSIS — E785 Hyperlipidemia, unspecified: Secondary | ICD-10-CM | POA: Diagnosis not present

## 2020-02-27 DIAGNOSIS — N183 Chronic kidney disease, stage 3 unspecified: Secondary | ICD-10-CM

## 2020-02-27 DIAGNOSIS — R531 Weakness: Secondary | ICD-10-CM | POA: Diagnosis not present

## 2020-02-27 DIAGNOSIS — D649 Anemia, unspecified: Secondary | ICD-10-CM

## 2020-02-27 DIAGNOSIS — I959 Hypotension, unspecified: Secondary | ICD-10-CM | POA: Diagnosis not present

## 2020-02-27 DIAGNOSIS — J449 Chronic obstructive pulmonary disease, unspecified: Secondary | ICD-10-CM | POA: Diagnosis not present

## 2020-02-27 DIAGNOSIS — B0089 Other herpesviral infection: Secondary | ICD-10-CM | POA: Diagnosis not present

## 2020-02-27 DIAGNOSIS — I255 Ischemic cardiomyopathy: Secondary | ICD-10-CM | POA: Diagnosis not present

## 2020-02-27 DIAGNOSIS — N179 Acute kidney failure, unspecified: Secondary | ICD-10-CM | POA: Diagnosis not present

## 2020-02-27 DIAGNOSIS — I4891 Unspecified atrial fibrillation: Secondary | ICD-10-CM | POA: Diagnosis not present

## 2020-02-27 DIAGNOSIS — I10 Essential (primary) hypertension: Secondary | ICD-10-CM | POA: Diagnosis not present

## 2020-02-27 DIAGNOSIS — Z48815 Encounter for surgical aftercare following surgery on the digestive system: Secondary | ICD-10-CM | POA: Diagnosis not present

## 2020-02-27 DIAGNOSIS — R600 Localized edema: Secondary | ICD-10-CM | POA: Diagnosis not present

## 2020-02-27 NOTE — Telephone Encounter (Signed)
Verbal orders given for PT 2x a week for 4 weeks.

## 2020-02-27 NOTE — Telephone Encounter (Signed)
Any update on the status of this encounter?  I see that the original note was sent to [place], [place] & Dr. Dahlia Byes on 02/22/20, when Amy called, however I do not see anything further.  The pt was scheduled to follow up w/Zach Olean Ree on 08/02 & this was rescheduled to 03/11/20. Please advise or close the encounter if no further action is needed.  Thank you

## 2020-02-27 NOTE — Telephone Encounter (Signed)
Gerald Stabs from [place] called for a verbal authorzation for pt [phone]

## 2020-02-28 LAB — CBC WITH DIFFERENTIAL/PLATELET
Basophils Absolute: 0.1 10*3/uL (ref 0.0–0.1)
Basophils Relative: 0.7 % (ref 0.0–3.0)
Eosinophils Absolute: 0.3 10*3/uL (ref 0.0–0.7)
Eosinophils Relative: 2.5 % (ref 0.0–5.0)
HCT: 31.4 % — ABNORMAL LOW (ref 36.0–46.0)
Hemoglobin: 10.6 g/dL — ABNORMAL LOW (ref 12.0–15.0)
Lymphocytes Relative: 12.7 % (ref 12.0–46.0)
Lymphs Abs: 1.5 10*3/uL (ref 0.7–4.0)
MCHC: 33.6 g/dL (ref 30.0–36.0)
MCV: 92.4 fl (ref 78.0–100.0)
Monocytes Absolute: 1.1 10*3/uL — ABNORMAL HIGH (ref 0.1–1.0)
Monocytes Relative: 9.2 % (ref 3.0–12.0)
Neutro Abs: 8.8 10*3/uL — ABNORMAL HIGH (ref 1.4–7.7)
Neutrophils Relative %: 74.9 % (ref 43.0–77.0)
Platelets: 280 10*3/uL (ref 150.0–400.0)
RBC: 3.4 Mil/uL — ABNORMAL LOW (ref 3.87–5.11)
RDW: 13.7 % (ref 11.5–15.5)
WBC: 11.8 10*3/uL — ABNORMAL HIGH (ref 4.0–10.5)

## 2020-02-28 LAB — RETICULOCYTES
ABS Retic: 64410 cells/uL (ref 20000–8000)
Retic Ct Pct: 1.9 %

## 2020-02-28 LAB — HEPATIC FUNCTION PANEL
ALT: 16 U/L (ref 0–35)
AST: 12 U/L (ref 0–37)
Albumin: 3.1 g/dL — ABNORMAL LOW (ref 3.5–5.2)
Alkaline Phosphatase: 60 U/L (ref 39–117)
Bilirubin, Direct: 0.1 mg/dL (ref 0.0–0.3)
Total Bilirubin: 0.5 mg/dL (ref 0.2–1.2)
Total Protein: 5.5 g/dL — ABNORMAL LOW (ref 6.0–8.3)

## 2020-02-28 LAB — BASIC METABOLIC PANEL
BUN: 9 mg/dL (ref 6–23)
CO2: 27 mEq/L (ref 19–32)
Calcium: 9.9 mg/dL (ref 8.4–10.5)
Chloride: 102 mEq/L (ref 96–112)
Creatinine, Ser: 0.79 mg/dL (ref 0.40–1.20)
GFR: 69.56 mL/min (ref >60.00)
Glucose, Bld: 120 mg/dL — ABNORMAL HIGH (ref 70–99)
Potassium: 3.8 mEq/L (ref 3.5–5.1)
Sodium: 138 mEq/L (ref 135–145)

## 2020-02-28 LAB — IBC + FERRITIN
Ferritin: 636.5 ng/mL — ABNORMAL HIGH (ref 10.0–291.0)
Iron: 30 ug/dL — ABNORMAL LOW (ref 42–145)
Saturation Ratios: 13.2 % — ABNORMAL LOW (ref 20.0–50.0)
Transferrin: 162 mg/dL — ABNORMAL LOW (ref 212.0–360.0)

## 2020-02-28 LAB — B12 AND FOLATE PANEL
Folate: 9.7 ng/mL (ref >5.9)
Vitamin B-12: >1526 pg/mL — ABNORMAL HIGH (ref 211–911)

## 2020-02-29 ENCOUNTER — Encounter: Payer: Self-pay | Admitting: Nurse Practitioner

## 2020-02-29 ENCOUNTER — Ambulatory Visit: Payer: Self-pay | Admitting: Urology

## 2020-03-01 ENCOUNTER — Encounter: Payer: Self-pay | Admitting: Internal Medicine

## 2020-03-01 ENCOUNTER — Other Ambulatory Visit: Payer: Self-pay

## 2020-03-01 ENCOUNTER — Ambulatory Visit (INDEPENDENT_AMBULATORY_CARE_PROVIDER_SITE_OTHER): Payer: PPO | Admitting: Internal Medicine

## 2020-03-01 VITALS — BP 80/54 | HR 67 | Temp 97.7°F | Resp 15 | Ht 63.0 in | Wt 127.8 lb

## 2020-03-01 DIAGNOSIS — F22 Delusional disorders: Secondary | ICD-10-CM | POA: Diagnosis not present

## 2020-03-01 DIAGNOSIS — I2510 Atherosclerotic heart disease of native coronary artery without angina pectoris: Secondary | ICD-10-CM | POA: Diagnosis not present

## 2020-03-01 DIAGNOSIS — R600 Localized edema: Secondary | ICD-10-CM | POA: Diagnosis not present

## 2020-03-01 DIAGNOSIS — R634 Abnormal weight loss: Secondary | ICD-10-CM

## 2020-03-01 DIAGNOSIS — R7301 Impaired fasting glucose: Secondary | ICD-10-CM

## 2020-03-01 DIAGNOSIS — F411 Generalized anxiety disorder: Secondary | ICD-10-CM

## 2020-03-01 DIAGNOSIS — I9589 Other hypotension: Secondary | ICD-10-CM

## 2020-03-01 DIAGNOSIS — E861 Hypovolemia: Secondary | ICD-10-CM | POA: Diagnosis not present

## 2020-03-01 DIAGNOSIS — N1831 Chronic kidney disease, stage 3a: Secondary | ICD-10-CM | POA: Diagnosis not present

## 2020-03-01 DIAGNOSIS — D649 Anemia, unspecified: Secondary | ICD-10-CM

## 2020-03-01 MED ORDER — RISPERIDONE 1 MG PO TABS: 1.0000 mg | ORAL_TABLET | Freq: Every day | ORAL | 2 refills | Status: DC

## 2020-03-01 NOTE — Patient Instructions (Addendum)
Stop the zaroxolyn  And furosemide .  Resume the furosemide on Sunday at 1/2 tablet (10 mg daily)  Suspend the losartan until Sunday.  Only take the medication  If your BP>140/90  Reduce the imdur to 30 mg for tomorrow  (cut pill in half going forward) starting tomorrow.  Starting on Monday,  No more Imdur,  use only losartan   We are stopping the Imdur completely because it may be aggravating the swelling in your feet.   So on Saturday:  No furosemide, zaroxolyn, or losartan.  Only 1/2 Imdur on Saturday  And Sunday    Please start taking the risperidone  At bedtime .  You may reduce the dose to 1/2 tablet if it is too strong.   For transportation to doctor's visits:  [facility]  [place] [facility] 1800 [phone]

## 2020-03-01 NOTE — Progress Notes (Signed)
Subjective:  Patient ID: Audrey Duran, female    DOB: Dec 19, 1934  Age: 85 y.o. MRN: [id number] CC: The primary encounter diagnosis was Hypotension due to hypovolemia. 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  HPI Audrey Duran presents for FOLLOW UP  On edema with 15 lb weight gain during hospital follow up on Monday .   This visit occurred during the SARS-CoV-2 public health emergency.  Safety protocols were in place, including screening questions prior to the visit, additional usage of staff PPE, and extensive cleaning of exam room while observing appropriate contact time as indicated for disinfecting solutions.   Patient was seen by Dawson Bills on Monday for hospital follow up from her appendectomy .  She had been discharged to home with Home health Nursing and appeared volume overloaded, and xaroxolyn added to furosemide . She is down 11 lbs since Monday. She has had her JP drain removed.  She appetite is improving.  more stable than she did on Monday and desp9te her low blood pressure denies symptoms of dizziness and orthostatic symptoms. Accompanied by her daughter Audrey Duran who lives one hour away. Patient is receiving home health for [place]  Daughter feels she is doing OK, better than anticipated based on the reports she was receiving.   Paranoid delusions:  She continues to defer use of medications prescribed during previous [facility] admission , despite being represcribed by me last month.  She refuses to see Psychiatry.  She has been unable to reach the psychologist and I placed a referral to [facility] on  July 12. She is calm (per usual) and denies a feeling of fear or of being  in danger from the characters who are central to her delusion.  She has no desire to move from her residence.   She does need assistance with transportation.  Her daughter was given contact information for [facility]    Hypotension: Taking Imdur and losartan as well as 2 diureticssince Monday. Denies dizziness..  Steady on feet,.  Still has 2 + pitting edema on feet only.  REviewed medications with patient and daughter with plans to dc current diuretic regimen and dc Imdur given its likely responsibility for the persistent LE edema she has been having for the last several weeks       Outpatient Medications Prior to Visit  Medication Sig Dispense Refill  . acyclovir (ZOVIRAX) 400 MG tablet Take 1 tablet (400 mg total) by mouth daily. 90 tablet 1  . alendronate (FOSAMAX) 70 MG tablet Take 1 tablet (70 mg total) by mouth every 7 (seven) days. Take with a full glass of water on an empty stomach. 12 tablet 3  . ALPRAZolam (XANAX) 0.5 MG tablet TAKE 1 TABLET BY MOUTH AT BEDTIME AS NEEDED FOR SLEEP (Patient taking differently: Take 0.5 mg by mouth at bedtime as needed for sleep. ) 30 tablet 1  . aspirin 81 MG tablet Take 81 mg by mouth at bedtime.     . carbonyl iron (FEOSOL) 45 MG TABS Take one by mouth daily    . Cholecalciferol (VITAMIN D3) 3000 UNITS TABS Take 1 tablet by mouth daily. Take one by mouth daily 90 tablet 3  . cyanocobalamin 1000 MCG tablet Take  one by mouth daily     . furosemide (LASIX) 20 MG tablet Take 0.5 tablets (10 mg total) by mouth daily. 45 tablet 1  . losartan (COZAAR) 25 MG tablet Take 1 tablet (25 mg total) by mouth daily. 90 tablet 1  . magnesium oxide (MAG-OX) 400 MG tablet Take 400 mg by mouth daily.      . meclizine (ANTIVERT) 50 MG tablet Take 50 mg by mouth 3 (three) times daily as needed.     . Omega-3 Fatty Acids (FISH OIL) 1200 MG CAPS Take 1 capsule by mouth daily. Take one by mouth daily     . oxyCODONE (OXY IR/ROXICODONE) 5 MG immediate release tablet  Take 1 tablet (5 mg total) by mouth every 6 (six) hours as needed for severe pain or breakthrough pain. 25 tablet 0  . pravastatin (PRAVACHOL) 80 MG tablet Take 1 tablet (80 mg total) by mouth daily. 90 tablet 1  . timolol (TIMOPTIC) 0.5 % ophthalmic solution Place 1 drop into both eyes 2 (two) times daily.     . DULoxetine (CYMBALTA) 20 MG capsule Take 20 mg by mouth daily.     . isosorbide mononitrate (IMDUR) 60 MG 24 hr tablet Take 1 tablet (60 mg total) by mouth daily. 90 tablet 1  . metolazone (ZAROXOLYN) 5 MG tablet Take 1 tablet (5 mg total) by mouth daily. Take 30 min before the furosemide one time daily 30 tablet 0  . risperiDONE (RISPERDAL) 1 MG tablet Take 1 tablet (1 mg total) by mouth at bedtime. 30 tablet 2   No facility-administered medications prior to visit.    Review of Systems;  Patient denies headache, fevers, malaise, unintentional weight loss, skin rash, eye pain, sinus congestion and sinus pain, sore throat, dysphagia,  hemoptysis , cough, dyspnea, wheezing, chest pain, palpitations, orthopnea,, abdominal pain, nausea, melena, diarrhea, constipation, flank pain, dysuria, hematuria, urinary  Frequency, nocturia, numbness, tingling, seizures,  Focal weakness, Loss of consciousness,  Tremor, insomnia, depression,and suicidal ideation.      Objective:  BP (!) 80/54 (BP Location: Left Arm, Patient Position: Sitting, Cuff Size: Normal)   Pulse 67   Temp 97.7 F (36.5 C) (Oral)   Resp 15   Ht 5\' 3"  (1.6 m)   Wt 127 lb 12.8 oz (58 kg)   SpO2 93%   BMI 22.64 kg/m   BP Readings from Last 3 Encounters:  03/01/20 (!) 80/54  02/26/20 (!) 168/80  02/23/20 (!) 139/50    Wt Readings from Last 3 Encounters:  03/01/20 127 lb 12.8 oz (58 kg)  02/26/20 138 lb 6.4 oz (62.8 kg)  02/20/20 123 lb (55.8 kg)    General appearance: alert, cooperative and appears stated age Ears: normal TM's and external ear canals both ears Throat: lips, mucosa, and tongue normal; teeth and  gums normal Neck: no adenopathy, no carotid bruit, supple, symmetrical, trachea midline and thyroid not enlarged, symmetric, no tenderness/mass/nodules Back: symmetric, no curvature. ROM normal. No CVA tenderness. Lungs: clear to auscultation bilaterally Heart: regular rate and rhythm, S1, S2 normal, no murmur, click, rub or gallop Abdomen: soft, non-tender; bowel sounds normal; no masses,  no organomegaly Pulses: 2+ and symmetric Skin: Skin color, texture, turgor poor with tenting noted.   Bilateral pedal edema aggravated by shoes.  Lymph nodes: Cervical, supraclavicular, and axillary nodes normal.  Lab Results  Component Value Date   HGBA1C 5.8 08/21/2019   HGBA1C 5.9 04/13/2019   HGBA1C 5.8 10/06/2018    Lab Results  Component  Value Date   CREATININE 0.95 (H) 03/01/2020   CREATININE 0.79 02/27/2020   CREATININE 1.09 (H) 02/23/2020    Lab Results  Component Value Date   WBC 11.8 (H) 02/27/2020   HGB 10.6 (L) 02/27/2020   HCT 31.4 (L) 02/27/2020   PLT 280.0 02/27/2020   GLUCOSE 109 (H) 03/01/2020   CHOL 159 10/06/2018   TRIG 82.0 10/06/2018   HDL 51.10 10/06/2018   LDLCALC 91 10/06/2018   ALT 16 02/27/2020   AST 12 02/27/2020   NA 138 03/01/2020   K 3.8 03/01/2020   CL 96 (L) 03/01/2020   CREATININE 0.95 (H) 03/01/2020   BUN 17 03/01/2020   CO2 30 03/01/2020   TSH 1.79 08/21/2019   INR 1.0 02/16/2012   HGBA1C 5.8 08/21/2019    CT ABDOMEN PELVIS W CONTRAST  Result Date: 02/20/2020 CLINICAL DATA:  Acute right lower quadrant abdominal pain. EXAM: CT ABDOMEN AND PELVIS WITH CONTRAST TECHNIQUE: Multidetector CT imaging of the abdomen and pelvis was performed using the standard protocol following bolus administration of intravenous contrast. CONTRAST:  7mL OMNIPAQUE IOHEXOL 300 MG/ML  SOLN COMPARISON:  None. FINDINGS: Lower chest: No acute abnormality. Hepatobiliary: No focal liver abnormality is seen. No gallstones, gallbladder wall thickening, or biliary dilatation.  Pancreas: Unremarkable. No pancreatic ductal dilatation or surrounding inflammatory changes. Spleen: Normal in size without focal abnormality. Adrenals/Urinary Tract: Adrenal glands appear normal. Right renal cyst is noted. No hydronephrosis or renal obstruction is noted. No renal or ureteral calculi are noted. Urinary bladder is unremarkable. Stomach/Bowel: The stomach appears normal. There is no evidence of bowel obstruction. The appendix is enlarged with surrounding inflammation consistent with acute appendicitis. Appendix: Location: Right lower quadrant. Diameter: 13 mm. Appendicolith: Yes. Mucosal hyper-enhancement: Yes. Extraluminal gas: No. Periappendiceal collection: No. Vascular/Lymphatic: Aortic atherosclerosis. No enlarged abdominal or pelvic lymph nodes. Reproductive: Status post hysterectomy. No adnexal masses. Other: No abdominal wall hernia or abnormality. No abdominopelvic ascites. Musculoskeletal: No acute or significant osseous findings. IMPRESSION: 1. Findings consistent with acute appendicitis. No abscess is noted. Aortic Atherosclerosis (ICD10-I70.0). Electronically Signed   By: Marijo Conception M.D.   On: 02/20/2020 13:09    Assessment & Plan:   Problem List Items Addressed This Visit      Unprioritized   Anemia    New onset noticed in July 2021.  Iron stores are low and she has had unintentional weight loss due to psychiatric issues , with potential other contributing etiologies.  She will need to have a hemoccult done (last colonoscopy was in 2006)   Lab Results  Component Value Date   VITAMINB12 >1526 (H) 02/27/2020   Lab Results  Component Value Date   TSH 1.79 08/21/2019   Lab Results  Component Value Date   IRON 30 (L) 02/27/2020   TIBC 341 07/31/2011   FERRITIN 636.5 (H) 02/27/2020         Relevant Orders   Fecal occult blood, imunochemical   Anxiety state    Her anxiety is better controlled currently but is aggravated by her paranoid delusions.  She has not  had any sightings since her hospitalization for appendicitis with rupture. She is however willing to start risperidal after a 3rd discussion today       CAD (coronary artery disease), native coronary artery   CKD (chronic kidney disease) stage 3, GFR 30-59 ml/min    With reductioin in GFR due to diuretics. Regimen changed to furosemid 10 mg daily and will dc if edema resolves by stopping  imdur .    Lab Results  Component Value Date   CREATININE 0.95 (H) 03/01/2020   CREATININE 0.79 02/27/2020   CREATININE 1.09 (H) 02/23/2020    Lab Results  Component Value Date   CREATININE 0.95 (H) 03/01/2020         Hypotension due to hypovolemia - Primary    Stopping all diuretics for 48 hours.  Resuming 10 mg furosemide with plans to reasses after imdur has been discontinued       Relevant Orders   Basic metabolic panel (Completed)   Impaired fasting glucose    A1c remains in prediabetic range  Lab Results  Component Value Date   HGBA1C 5.8 08/21/2019         Lower extremity edema    suspecte due to Imdur.  Stopping medication.  If it persist, will ask Paraschoes to repeat her ECHO (last one in 2017)      Paranoid delusion [facility])    She and daughter have been advised of need to resume risperdal at bedtime       Unintentional weight loss of more than 10 pounds    I suspect due to FTD.  However, she refuses to see a neurologist.  Screened for vitamin deficiencies.  I have reviewed her diet and recommended that she increase her protein and fat intake while monitoring her carbohydrates.   . Lab Results  Component Value Date   TSH 1.79 08/21/2019   Lab Results  Component Value Date   VITAMINB12 >1526 (H) 02/27/2020   No results found for: HIV1X2        A total of 45 minutes was spent with patient more than half of which was spent in counseling patient on the above mentioned issues , reviewing and explaining recent labs and imaging studies done, and coordination of  care.  I have discontinued Pamella L. Melikian's DULoxetine, isosorbide mononitrate, and metolazone. I am also having her maintain her aspirin, carbonyl iron, magnesium oxide, Fish Oil, cyanocobalamin, Vitamin D3, timolol, alendronate, ALPRAZolam, acyclovir, furosemide, meclizine, pravastatin, oxyCODONE, losartan, and risperiDONE.  Meds ordered this encounter  Medications  . risperiDONE (RISPERDAL) 1 MG tablet    Sig: Take 1 tablet (1 mg total) by mouth at bedtime.    Dispense:  30 tablet    Refill:  2    Medications Discontinued During This Encounter  Medication Reason  . risperiDONE (RISPERDAL) 1 MG tablet   . isosorbide mononitrate (IMDUR) 60 MG 24 hr tablet   . DULoxetine (CYMBALTA) 20 MG capsule   . metolazone (ZAROXOLYN) 5 MG tablet     Follow-up: Return in about 4 weeks (around 03/29/2020).   Crecencio Mc, MD

## 2020-03-02 LAB — BASIC METABOLIC PANEL
BUN/Creatinine Ratio: 18 (calc) (ref 6–22)
BUN: 17 mg/dL (ref 7–25)
CO2: 30 mmol/L (ref 20–32)
Calcium: 9.5 mg/dL (ref 8.6–10.4)
Chloride: 96 mmol/L — ABNORMAL LOW (ref 98–110)
Creat: 0.95 mg/dL — ABNORMAL HIGH (ref 0.60–0.88)
Glucose, Bld: 109 mg/dL — ABNORMAL HIGH (ref 65–99)
Potassium: 3.8 mmol/L (ref 3.5–5.3)
Sodium: 138 mmol/L (ref 135–146)

## 2020-03-03 DIAGNOSIS — I959 Hypotension, unspecified: Secondary | ICD-10-CM | POA: Insufficient documentation

## 2020-03-03 DIAGNOSIS — I951 Orthostatic hypotension: Secondary | ICD-10-CM | POA: Insufficient documentation

## 2020-03-03 DIAGNOSIS — E861 Hypovolemia: Secondary | ICD-10-CM | POA: Insufficient documentation

## 2020-03-03 NOTE — Assessment & Plan Note (Signed)
I suspect due to FTD.  However, she refuses to see a neurologist.  Screened for vitamin deficiencies.  I have reviewed her diet and recommended that she increase her protein and fat intake while monitoring her carbohydrates.   . Lab Results  Component Value Date   TSH 1.79 08/21/2019   Lab Results  Component Value Date   VITAMINB12 >1526 (H) 02/27/2020   No results found for: HIV1X2

## 2020-03-03 NOTE — Assessment & Plan Note (Signed)
She and daughter have been advised of need to resume risperdal at bedtime

## 2020-03-03 NOTE — Assessment & Plan Note (Signed)
Her anxiety is better controlled currently but is aggravated by her paranoid delusions.  She has not had any sightings since her hospitalization for appendicitis with rupture. She is however willing to start risperidal after a 3rd discussion today

## 2020-03-03 NOTE — Assessment & Plan Note (Signed)
Stopping all diuretics for 48 hours.  Resuming 10 mg furosemide with plans to reasses after imdur has been discontinued

## 2020-03-03 NOTE — Assessment & Plan Note (Signed)
A1c remains in prediabetic range  Lab Results  Component Value Date   HGBA1C 5.8 08/21/2019

## 2020-03-03 NOTE — Assessment & Plan Note (Signed)
suspecte due to Imdur.  Stopping medication.  If it persist, will ask Paraschoes to repeat her ECHO (last one in 2017)

## 2020-03-03 NOTE — Assessment & Plan Note (Signed)
New onset noticed in July 2021.  Iron stores are low and she has had unintentional weight loss due to psychiatric issues , with potential other contributing etiologies.  She will need to have a hemoccult done (last colonoscopy was in 2006)   Lab Results  Component Value Date   VITAMINB12 >1526 (H) 02/27/2020   Lab Results  Component Value Date   TSH 1.79 08/21/2019   Lab Results  Component Value Date   IRON 30 (L) 02/27/2020   TIBC 341 07/31/2011   FERRITIN 636.5 (H) 02/27/2020

## 2020-03-03 NOTE — Assessment & Plan Note (Signed)
With reductioin in GFR due to diuretics. Regimen changed to furosemid 10 mg daily and will dc if edema resolves by stopping imdur .    Lab Results  Component Value Date   CREATININE 0.95 (H) 03/01/2020   CREATININE 0.79 02/27/2020   CREATININE 1.09 (H) 02/23/2020    Lab Results  Component Value Date   CREATININE 0.95 (H) 03/01/2020

## 2020-03-04 ENCOUNTER — Telehealth: Payer: Self-pay | Admitting: Internal Medicine

## 2020-03-04 NOTE — Telephone Encounter (Signed)
[facility] wanted verbal for skill nursing order for once a week for 5 weeks- Ria Comment at [phone]

## 2020-03-05 NOTE — Telephone Encounter (Signed)
Verbal orders given  

## 2020-03-07 ENCOUNTER — Other Ambulatory Visit: Payer: Self-pay | Admitting: Internal Medicine

## 2020-03-07 MED ORDER — QUETIAPINE FUMARATE 25 MG PO TABS
25.0000 mg | ORAL_TABLET | Freq: Every day | ORAL | 2 refills | Status: DC
Start: 2020-03-07 — End: 2020-07-09

## 2020-03-08 ENCOUNTER — Telehealth: Payer: Self-pay | Admitting: Internal Medicine

## 2020-03-08 NOTE — Telephone Encounter (Signed)
Pt has questions about all of her medication

## 2020-03-08 NOTE — Telephone Encounter (Signed)
Pt called back about medication. She wants to know if she has to cut furosemide (LASIX) 20 MG tablet in half?

## 2020-03-08 NOTE — Telephone Encounter (Signed)
Spoke with pt to let her know that she does need to cut the furosemide in half because she is only supposed to be taking 10 mg and she has a 20 mg tablet. Pt also stated that she is only taking the losartan for her blood pressure now. Pt gave a verbal understanding.

## 2020-03-11 ENCOUNTER — Other Ambulatory Visit: Payer: Self-pay

## 2020-03-11 ENCOUNTER — Encounter: Payer: Self-pay | Admitting: Physician Assistant

## 2020-03-11 ENCOUNTER — Encounter: Payer: PPO | Admitting: Physician Assistant

## 2020-03-11 ENCOUNTER — Ambulatory Visit (INDEPENDENT_AMBULATORY_CARE_PROVIDER_SITE_OTHER): Payer: PPO | Admitting: Physician Assistant

## 2020-03-11 VITALS — BP 112/66 | HR 61 | Temp 98.3°F | Ht 63.0 in | Wt 123.0 lb

## 2020-03-11 DIAGNOSIS — F331 Major depressive disorder, recurrent, moderate: Secondary | ICD-10-CM | POA: Diagnosis not present

## 2020-03-11 DIAGNOSIS — F29 Unspecified psychosis not due to a substance or known physiological condition: Secondary | ICD-10-CM | POA: Diagnosis not present

## 2020-03-11 DIAGNOSIS — Z09 Encounter for follow-up examination after completed treatment for conditions other than malignant neoplasm: Secondary | ICD-10-CM

## 2020-03-11 DIAGNOSIS — Z9049 Acquired absence of other specified parts of digestive tract: Secondary | ICD-10-CM

## 2020-03-11 DIAGNOSIS — F419 Anxiety disorder, unspecified: Secondary | ICD-10-CM | POA: Diagnosis not present

## 2020-03-11 NOTE — Progress Notes (Signed)
[facility] POST-OP OFFICE VISIT  03/11/2020  HPI: Audrey Duran is a 85 y.o. female 20 days s/p laparoscopic appendectomy for acute appendicitis with Dr Dahlia Byes  Doing very well She had issues with fluid overload after going home which was managed by her PCP. This is resolved, no peripheral edema, CP, SOB No abdominal pain, nausea, emesis, fever, chills Tolerating PO Mobilizing well  Vital signs: BP 112/66   Pulse 61   Temp 98.3 F (36.8 C)   Ht 5\' 3"  (1.6 m)   Wt 123 lb (55.8 kg)   SpO2 97%   BMI 21.79 kg/m    Physical Exam: Constitutional: Well appearing female, NAD Abdomen: Soft, non-tender, non-distended, no rebound/guarding Skin: Laparoscopic incisions are well healed, no erythema or drainage  MSK: No peripheral edema  Assessment/Plan: This is a 85 y.o. female 20 days s/p laparoscopic appendectomy for acute appendicitis   - pain control prn; tylenol/ibuprofen  - reviewed wound care  - reviewed lifting restrictions  - reviewed pathology: acute appendicitis with perforation   - rtc prn   -- Edison Simon, PA-C [facility] 03/11/2020, 10:26 AM [phone] M-F: 7am - 4pm

## 2020-03-11 NOTE — Patient Instructions (Addendum)
Follow-up with our office as needed.  Please call and ask to speak with a nurse if you develop questions or concerns.   GENERAL POST-OPERATIVE PATIENT INSTRUCTIONS   WOUND CARE INSTRUCTIONS:  Keep a dry clean dressing on the wound if there is drainage. The initial bandage may be removed after 24 hours.  Once the wound has quit draining you may leave it open to air.  If clothing rubs against the wound or causes irritation and the wound is not draining you may cover it with a dry dressing during the daytime.  Try to keep the wound dry and avoid ointments on the wound unless directed to do so.  If the wound becomes bright red and painful or starts to drain infected material that is not clear, please contact your physician immediately.  If the wound is mildly pink and has a thick firm ridge underneath it, this is normal, and is referred to as a healing ridge.  This will resolve over the next 4-6 weeks.  BATHING: You may shower if you have been informed of this by your surgeon. However, Please do not submerge in a tub, hot tub, or pool until incisions are completely sealed or have been told by your surgeon that you may do so.  DIET:  You may eat any foods that you can tolerate.  It is a good idea to eat a high fiber diet and take in plenty of fluids to prevent constipation.  If you do become constipated you may want to take a mild laxative or take ducolax tablets on a daily basis until your bowel habits are regular.  Constipation can be very uncomfortable, along with straining, after recent surgery.  ACTIVITY:  You are encouraged to cough and deep breath or use your incentive spirometer if you were given one, every 15-30 minutes when awake.  This will help prevent respiratory complications and low grade fevers post-operatively if you had a general anesthetic.  You may want to hug a pillow when coughing and sneezing to add additional support to the surgical area, if you had abdominal or chest surgery, which  will decrease pain during these times.  You are encouraged to walk and engage in light activity for the next two weeks.  You should not lift more than 20 pounds for 6 weeks after surgery as it could put you at increased risk for complications.  Twenty pounds is roughly equivalent to a plastic bag of groceries. At that time- Listen to your body when lifting, if you have pain when lifting, stop and then try again in a few days. Soreness after doing exercises or activities of daily living is normal as you get back in to your normal routine.  MEDICATIONS:  Try to take narcotic medications and anti-inflammatory medications, such as tylenol, ibuprofen, naprosyn, etc., with food.  This will minimize stomach upset from the medication.  Should you develop nausea and vomiting from the pain medication, or develop a rash, please discontinue the medication and contact your physician.  You should not drive, make important decisions, or operate machinery when taking narcotic pain medication.  SUNBLOCK Use sun block to incision area over the next year if this area will be exposed to sun. This helps decrease scarring and will allow you avoid a permanent darkened area over your incision.  QUESTIONS:  Please feel free to call our office if you have any questions, and we will be glad to assist you.    

## 2020-04-05 ENCOUNTER — Encounter: Payer: Self-pay | Admitting: Internal Medicine

## 2020-04-05 ENCOUNTER — Telehealth (INDEPENDENT_AMBULATORY_CARE_PROVIDER_SITE_OTHER): Payer: PPO | Admitting: Internal Medicine

## 2020-04-05 DIAGNOSIS — Z634 Disappearance and death of family member: Secondary | ICD-10-CM

## 2020-04-05 DIAGNOSIS — F4321 Adjustment disorder with depressed mood: Secondary | ICD-10-CM | POA: Insufficient documentation

## 2020-04-05 MED ORDER — ALPRAZOLAM 0.5 MG PO TABS: 0.5000 mg | ORAL_TABLET | Freq: Every evening | ORAL | 3 refills | Status: DC | PRN

## 2020-04-05 NOTE — Assessment & Plan Note (Addendum)
[name] died of a massive MI on 04-04-23.  She is grieving but has adequate emotional support . Her main issue is persistent insomnia. She did not tolerate trial of seroquel,  Which was chose because of her recurrent delusions .  She has been using  Alprazolam and is requesting a refill . The risks and benefits of benzodiazepine use were discussed with patient today including excessive sedation leading to respiratory depression,  impaired thinking/driving, and addiction.  Patient was advised to avoid concurrent use with alcohol, to use medication only as needed and not to share with others  .

## 2020-04-05 NOTE — Progress Notes (Signed)
Telephone  Note  This visit type was conducted due to national recommendations for restrictions regarding the COVID-19 pandemic (e.g. social distancing).  This format is felt to be most appropriate for this patient at this time.  All issues noted in this document were discussed and addressed.  No physical exam was performed (except for noted visual exam findings with Video Visits).   I connected with@ on 04/05/20 at  2:00 PM EDT by telephone and verified that I am speaking with the correct person using two identifiers. Location patient: home Location provider: work or home office Persons participating in the virtual visit: patient, provider  I discussed the limitations, risks, security and privacy concerns of performing an evaluation and management service by telephone and the availability of in person appointments. I also discussed with the patient that there may be a patient responsible charge related to this service. The patient expressed understanding and agreed to proceed.  Reason for visit: grief  HPI:  Audrey Duran is an 85 yr old female with a history of paranoid delusion (mild) who presents for one month follow up.  During the interim her son [name]  passed away after an AMI on 04/05/2020. He had a history of heart disease and had been lost to follow up.  She is grieving .  She tried to resume exercise today with her group that has been very supportive .  She is not sleeping well,  Having trouble falling asleep unless she takes 1/2 alprazolam, sometimes requires a whole tablet (0.5 mg)  Requesting refill.  Did not tolerate seroquel trial due to sedation    ROS: Patient denies headache, fevers, malaise, unintentional weight loss, skin rash, eye pain, sinus congestion and sinus pain, sore throat, dysphagia,  hemoptysis , cough, dyspnea, wheezing, chest pain, palpitations, orthopnea, edema, abdominal pain, nausea, melena, diarrhea, constipation, flank pain, dysuria, hematuria, urinary   Frequency, nocturia, numbness, tingling, seizures,  Focal weakness, Loss of consciousness,  Tremor, insomnia, depression, anxiety, and suicidal ideation.      Past Medical History:  Diagnosis Date  . Anemia   . Atrial fibrillation ([place])   . COPD (chronic obstructive pulmonary disease) ([place])   . Coronary artery disease 1995   s/p AMI , no history of stents,  [name]  . Hyperlipidemia   . Ischemic cardiomyopathy Dec 2011   ETT Sestamibi study apical scar, no ischemia, [name]  . Myocardial infarction ([place])   . Recurrent HSV (herpes simplex virus)     Past Surgical History:  Procedure Laterality Date  . ABDOMINAL HYSTERECTOMY  june 2014  . BREAST EXCISIONAL BIOPSY Left 1990's   neg  . 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 CATARACT EXTRACTION  2004, 2006  . JOINT REPLACEMENT  July 2013   total hip , Sabra Heck  . LAPAROSCOPIC APPENDECTOMY N/A 02/20/2020   Procedure: APPENDECTOMY LAPAROSCOPIC;  Surgeon: Jules Husbands, MD;  Location: [facility] ORS;  Service: General;  Laterality: N/A;    Family History  Problem Relation Age of Onset  . Cancer Mother 81       Pancreatic   . Heart disease Father   . Cancer Sister 15       Breast Cancer  . Breast cancer Sister 41    SOCIAL HX: lives alone.  reports that she quit smoking about 28 years ago. She has never used smokeless tobacco. She reports that she does not drink alcohol and does not use drugs.   Current Outpatient Medications:  .  acyclovir (ZOVIRAX) 400 MG tablet, Take 1 tablet (400 mg total) by mouth daily., Disp: 90 tablet, Rfl: 1 .  alendronate (FOSAMAX) 70 MG tablet, Take 1 tablet (70 mg total) by mouth every 7 (seven) days. Take with a full glass of water on an empty stomach., Disp: 12 tablet, Rfl: 3 .  ALPRAZolam (XANAX) 0.5 MG tablet, Take 1 tablet (0.5 mg total) by mouth at bedtime as needed., Disp: 30 tablet, Rfl: 3 .  aspirin 81 MG tablet, Take 81 mg by mouth at bedtime. , Disp: , Rfl:  .  carbonyl  iron (FEOSOL) 45 MG TABS, Take one by mouth daily, Disp: , Rfl:  .  Cholecalciferol (VITAMIN D3) 3000 UNITS TABS, Take 1 tablet by mouth daily. Take one by mouth daily, Disp: 90 tablet, Rfl: 3 .  cyanocobalamin 1000 MCG tablet, Take one by mouth daily , Disp: , Rfl:  .  furosemide (LASIX) 20 MG tablet, Take 0.5 tablets (10 mg total) by mouth daily., Disp: 45 tablet, Rfl: 1 .  losartan (COZAAR) 25 MG tablet, Take 1 tablet (25 mg total) by mouth daily., Disp: 90 tablet, Rfl: 1 .  magnesium oxide (MAG-OX) 400 MG tablet, Take 400 mg by mouth daily.  , Disp: , Rfl:  .  Omega-3 Fatty Acids (FISH OIL) 1200 MG CAPS, Take 1 capsule by mouth daily. Take one by mouth daily , Disp: , Rfl:  .  pravastatin (PRAVACHOL) 80 MG tablet, Take 1 tablet (80 mg total) by mouth daily., Disp: 90 tablet, Rfl: 1 .  timolol (TIMOPTIC) 0.5 % ophthalmic solution, Place 1 drop into both eyes 2 (two) times daily. , Disp: , Rfl:  .  meclizine (ANTIVERT) 50 MG tablet, Take 50 mg by mouth 3 (three) times daily as needed.  (Patient not taking: Reported on 04/05/2020), Disp: , Rfl:  .  QUEtiapine (SEROQUEL) 25 MG tablet, Take 1 tablet (25 mg total) by mouth at bedtime. (Patient not taking: Reported on 04/05/2020), Disp: 30 tablet, Rfl: 2  EXAM:  VITALS per patient if applicable:   General impression: alert, cooperative and articulate.  Grief stricken   Lungs: speech is fluent sentence length suggests that patient is not short of breath and not punctuated by cough, sneezing or sniffing. Marland Kitchen   Psych: affect normal.  speech is articulate and non pressured .  Denies suicidal thoughts    ASSESSMENT AND PLAN:  Discussed the following assessment and plan:  Grief at loss of child  Grief at loss of child Son [name] died of a massive MI on 04/17/2023.  She is grieving but has adequate emotional support . Her main issue is persistent insomnia. She did not tolerate trial of seroquel,  Which was chose because of her recurrent delusions .  She  has been using  Alprazolam and is requesting a refill . The risks and benefits of benzodiazepine use were discussed with patient today including excessive sedation leading to respiratory depression,  impaired thinking/driving, and addiction.  Patient was advised to avoid concurrent use with alcohol, to use medication only as needed and not to share with others  .     I discussed the assessment and treatment plan with the patient. The patient was provided an opportunity to ask questions and all were answered. The patient agreed with the plan and demonstrated an understanding of the instructions.   The patient was advised to call back or seek an in-person evaluation if the symptoms worsen or if the condition fails to improve as  anticipated.  I provided 22 minutes of non-face-to-face time during this encounter.   Crecencio Mc, MD

## 2020-04-11 ENCOUNTER — Telehealth: Payer: Self-pay | Admitting: Internal Medicine

## 2020-04-11 NOTE — Telephone Encounter (Signed)
Pt called to see if she needed to start back taking the losartan (COZAAR) 25 MG tablet

## 2020-04-11 NOTE — Telephone Encounter (Signed)
She was never instructed to STOP losartan., only the Imdur.  I cannot advise her without knowing what her blood pressure readings have been

## 2020-04-12 NOTE — Telephone Encounter (Signed)
She will need an RN visit for a BP check before resuming imdur

## 2020-04-12 NOTE — Telephone Encounter (Signed)
Patient reports she has been taking Losartan. Reports that she is not having any symptoms, does not check BP at home. Patient called to make sure that she was not to start taking Imdur.

## 2020-04-16 DIAGNOSIS — F29 Unspecified psychosis not due to a substance or known physiological condition: Secondary | ICD-10-CM | POA: Diagnosis not present

## 2020-04-16 DIAGNOSIS — F331 Major depressive disorder, recurrent, moderate: Secondary | ICD-10-CM | POA: Diagnosis not present

## 2020-04-16 DIAGNOSIS — F419 Anxiety disorder, unspecified: Secondary | ICD-10-CM | POA: Diagnosis not present

## 2020-04-19 NOTE — Telephone Encounter (Signed)
Patient scheduled for BP check on 05/02/20

## 2020-04-23 ENCOUNTER — Other Ambulatory Visit: Payer: Self-pay

## 2020-04-23 MED ORDER — FUROSEMIDE 20 MG PO TABS: 10.0000 mg | ORAL_TABLET | Freq: Every day | ORAL | 1 refills | Status: DC

## 2020-05-02 ENCOUNTER — Other Ambulatory Visit: Payer: Self-pay

## 2020-05-02 ENCOUNTER — Ambulatory Visit: Payer: PPO

## 2020-05-02 VITALS — BP 148/64 | HR 56

## 2020-05-02 DIAGNOSIS — Z0130 Encounter for examination of blood pressure without abnormal findings: Secondary | ICD-10-CM

## 2020-05-02 NOTE — Progress Notes (Signed)
Patient is here for a BP check due to switching back to a medication, as per patient.  Currently patients BP is 148/64 and BPM is 56.  Patient has no complaints of headaches, blurry vision, chest pain, arm pain, light headedness, dizziness, and nor jaw pain. Please see previous note for order.

## 2020-05-03 DIAGNOSIS — F29 Unspecified psychosis not due to a substance or known physiological condition: Secondary | ICD-10-CM | POA: Diagnosis not present

## 2020-05-03 DIAGNOSIS — F419 Anxiety disorder, unspecified: Secondary | ICD-10-CM | POA: Diagnosis not present

## 2020-05-03 DIAGNOSIS — F331 Major depressive disorder, recurrent, moderate: Secondary | ICD-10-CM | POA: Diagnosis not present

## 2020-05-15 ENCOUNTER — Other Ambulatory Visit: Payer: Self-pay | Admitting: Internal Medicine

## 2020-05-15 DIAGNOSIS — R921 Mammographic calcification found on diagnostic imaging of breast: Secondary | ICD-10-CM

## 2020-05-21 DIAGNOSIS — F419 Anxiety disorder, unspecified: Secondary | ICD-10-CM | POA: Diagnosis not present

## 2020-05-21 DIAGNOSIS — F331 Major depressive disorder, recurrent, moderate: Secondary | ICD-10-CM | POA: Diagnosis not present

## 2020-05-21 DIAGNOSIS — F29 Unspecified psychosis not due to a substance or known physiological condition: Secondary | ICD-10-CM | POA: Diagnosis not present

## 2020-05-30 ENCOUNTER — Telehealth: Payer: Self-pay | Admitting: Internal Medicine

## 2020-05-30 DIAGNOSIS — F331 Major depressive disorder, recurrent, moderate: Secondary | ICD-10-CM | POA: Diagnosis not present

## 2020-05-30 DIAGNOSIS — F419 Anxiety disorder, unspecified: Secondary | ICD-10-CM | POA: Diagnosis not present

## 2020-05-30 DIAGNOSIS — F29 Unspecified psychosis not due to a substance or known physiological condition: Secondary | ICD-10-CM | POA: Diagnosis not present

## 2020-05-30 NOTE — Telephone Encounter (Signed)
Miguel Dibble Therapist [phone] call in stated that she need to talk with Dr.Tullo about the patient and the patient has agreed to start medication.

## 2020-06-10 ENCOUNTER — Ambulatory Visit
Admission: RE | Admit: 2020-06-10 | Discharge: 2020-06-10 | Disposition: A | Discharge status: Home / Self Care | Payer: PPO | Source: Ambulatory Visit | Attending: Internal Medicine | Admitting: Internal Medicine

## 2020-06-10 ENCOUNTER — Other Ambulatory Visit: Payer: Self-pay

## 2020-06-10 DIAGNOSIS — R921 Mammographic calcification found on diagnostic imaging of breast: Secondary | ICD-10-CM | POA: Insufficient documentation

## 2020-06-14 DIAGNOSIS — F331 Major depressive disorder, recurrent, moderate: Secondary | ICD-10-CM | POA: Diagnosis not present

## 2020-06-14 DIAGNOSIS — F29 Unspecified psychosis not due to a substance or known physiological condition: Secondary | ICD-10-CM | POA: Diagnosis not present

## 2020-06-14 DIAGNOSIS — F419 Anxiety disorder, unspecified: Secondary | ICD-10-CM | POA: Diagnosis not present

## 2020-07-05 ENCOUNTER — Other Ambulatory Visit: Payer: Self-pay

## 2020-07-05 DIAGNOSIS — F419 Anxiety disorder, unspecified: Secondary | ICD-10-CM | POA: Diagnosis not present

## 2020-07-05 DIAGNOSIS — F331 Major depressive disorder, recurrent, moderate: Secondary | ICD-10-CM | POA: Diagnosis not present

## 2020-07-05 DIAGNOSIS — F29 Unspecified psychosis not due to a substance or known physiological condition: Secondary | ICD-10-CM | POA: Diagnosis not present

## 2020-07-05 MED ORDER — ALENDRONATE SODIUM 70 MG PO TABS: 70.0000 mg | ORAL_TABLET | ORAL | 3 refills | Status: DC

## 2020-07-05 MED ORDER — PRAVASTATIN SODIUM 80 MG PO TABS: 80.0000 mg | ORAL_TABLET | Freq: Every day | ORAL | 1 refills | Status: DC

## 2020-07-08 ENCOUNTER — Telehealth: Payer: Self-pay | Admitting: Internal Medicine

## 2020-07-08 DIAGNOSIS — H40003 Preglaucoma, unspecified, bilateral: Secondary | ICD-10-CM | POA: Diagnosis not present

## 2020-07-08 NOTE — Telephone Encounter (Signed)
Pt would like a call back about a medication that Dr. Derrel Nip wanted to try her on for 30 days.. she isn't sure of the name of the medication

## 2020-07-09 ENCOUNTER — Other Ambulatory Visit: Payer: Self-pay | Admitting: Internal Medicine

## 2020-07-09 MED ORDER — QUETIAPINE FUMARATE 25 MG PO TABS: 25.0000 mg | ORAL_TABLET | Freq: Every day | ORAL | 2 refills | Status: DC

## 2020-07-09 NOTE — Telephone Encounter (Signed)
Yes,  I have resent it to [facility].  Take it one hour before bedtime

## 2020-07-09 NOTE — Telephone Encounter (Signed)
LMTCB

## 2020-07-09 NOTE — Telephone Encounter (Signed)
Is the seroquel the medication that you wanted her to start and she never did?

## 2020-07-10 ENCOUNTER — Telehealth: Payer: Self-pay | Admitting: Internal Medicine

## 2020-07-10 NOTE — Telephone Encounter (Signed)
Patient aware of medication and did pick up from pharmacy; she is going to start taking it at bedtime as instructed.

## 2020-07-10 NOTE — Telephone Encounter (Signed)
Patient called about her QUEtiapine (SEROQUEL) 25 MG tablet, She is concerned about taking medication because on the bottle in dark black lettering it says it can kill you. Please call patient.

## 2020-07-10 NOTE — Telephone Encounter (Signed)
Called and spoke to Audrey Duran. I explained to her that Dr. Derrel Nip wants her to take Seroquel and that Dr. Derrel Nip states that she has not had a patient die from Seroquel. [name] verbalized understanding and states that she will take her medication as prescribed for 30 days. She had no further questions.

## 2020-07-16 DIAGNOSIS — H40003 Preglaucoma, unspecified, bilateral: Secondary | ICD-10-CM | POA: Diagnosis not present

## 2020-07-26 DIAGNOSIS — F419 Anxiety disorder, unspecified: Secondary | ICD-10-CM | POA: Diagnosis not present

## 2020-07-26 DIAGNOSIS — F331 Major depressive disorder, recurrent, moderate: Secondary | ICD-10-CM | POA: Diagnosis not present

## 2020-07-26 DIAGNOSIS — F29 Unspecified psychosis not due to a substance or known physiological condition: Secondary | ICD-10-CM | POA: Diagnosis not present

## 2020-08-01 ENCOUNTER — Ambulatory Visit: Payer: PPO | Admitting: Internal Medicine

## 2020-08-07 ENCOUNTER — Telehealth: Payer: Self-pay

## 2020-08-07 NOTE — Telephone Encounter (Signed)
Called and spoke to Audrey Duran. Audrey Duran states that she does not feel bad and has only been experiencing a runny nose and a "icky" feeling in her stomach x2days. Outside of this she feels fine. Today she had a bowl of chicken broth and states that she feels a lot better. Audrey Duran states that she does not feel any distress and does not need to be evaluated and declined going to an urgent care as today she feels better.

## 2020-08-07 NOTE — Telephone Encounter (Signed)
Pt called and asked for a refill on ALPRAZolam (XANAX) 0.5 MG tablet and then stated that she has been sick on her stomach for two days and now has chills. She would like a call back from you to advise.

## 2020-08-07 NOTE — Telephone Encounter (Signed)
Can you triage please 

## 2020-08-14 ENCOUNTER — Telehealth: Payer: Self-pay

## 2020-08-14 DIAGNOSIS — F419 Anxiety disorder, unspecified: Secondary | ICD-10-CM | POA: Diagnosis not present

## 2020-08-14 DIAGNOSIS — F29 Unspecified psychosis not due to a substance or known physiological condition: Secondary | ICD-10-CM | POA: Diagnosis not present

## 2020-08-14 DIAGNOSIS — F331 Major depressive disorder, recurrent, moderate: Secondary | ICD-10-CM | POA: Diagnosis not present

## 2020-08-14 NOTE — Telephone Encounter (Signed)
Pt said the home health nurse said she needs to get a urinalysis done. She wanted to schedule an appt.

## 2020-08-14 NOTE — Telephone Encounter (Signed)
I was unable to reach Miguel Dibble, but I spoke with patient who was agitated and tearful.  I was unable to reach her daughter rene,  Her sister Virl Cagey or her son [name] .  I reached out to EMS and police dept and the police will go out to do a well check on patient tonight.  If patient refuses to go home with daughter  [name] as planned (to live), rene will have the option of having her involuntarily committed as she has done in the past

## 2020-08-14 NOTE — Telephone Encounter (Signed)
Called pt to schedule her for a lab appt tomorrow and pt started talking about boys being in her house and seemed very confused. Pt stated that she would not be able to come tomorrow because her daughter is coming to get her tonight and take her to her her house. Pt then started crying and stating that she did not want to go with daughter, then went back to talking about the boys that were in her house and aggravating her. Spoke with Dr. Derrel Nip verbally and transferred to talk with her.

## 2020-08-14 NOTE — Telephone Encounter (Signed)
Outpatient behavioral health therapist Miguel Dibble called and would like a call back about pt. She states that pt's psychosis is worse and she is trying to get external resources involved. She would like Dr Derrel Nip to call her at [phone]

## 2020-08-14 NOTE — Telephone Encounter (Signed)
I am swamped currently and will not e able to call her until AFTER WORK

## 2020-08-14 NOTE — Telephone Encounter (Signed)
Called Otila Kluver back to let her know that you would not be able to call her back until after hours when you get done seeing patients. She stated that was fine. Then she went on to say that pt's psychosis has worsened and she is getting her neighbors to come in her house to check it out and they are calling the police. Otila Kluver stated that she reached out to law enforcement and they stated to her that there is nothing they can really do so she called mobile crisis to go out and assess pt. She has not heard back from them yet but hopes to by Friday. Otila Kluver is concerned that this could be metabolic issue, neurological issue or something past what she thinks therapy can do for her. She stated that she is still glad to see patient and talk to her because she feels that pt trust talking to her. Otila Kluver said if you would like to call her after work that is fine or she said that you could wait until she hears back from [employer].

## 2020-08-15 NOTE — Telephone Encounter (Signed)
Miguel Dibble was returning call her number is [phone] about patient

## 2020-08-15 NOTE — Telephone Encounter (Signed)
Audrey Duran returned your call from yesterday.

## 2020-08-19 ENCOUNTER — Telehealth: Payer: Self-pay

## 2020-08-19 MED ORDER — FUROSEMIDE 20 MG PO TABS: 10.0000 mg | ORAL_TABLET | Freq: Every day | ORAL | 1 refills | Status: DC

## 2020-08-19 NOTE — Telephone Encounter (Signed)
Pt's daugther Joseph Art called and states that pt had mental issues last week and she had to go get her and take her to her house in [place]. Pt did not bring furosemide (LASIX) 20 MG tablet with her. She would like a call back and a refill sent to [facility] on [address] in [place], [place]. She states that she is not on the DPR-her brother was but he died in 2023/02/10. She would like a call back @ [phone]

## 2020-08-19 NOTE — Telephone Encounter (Signed)
Spoke with pt and let her know that the medication has been sent to the pharmacy requested. Also scheduled pt for an appt with Dr. Derrel Nip on Friday to discuss with daughter and patient the concerns for pt living alone.

## 2020-08-23 ENCOUNTER — Telehealth (INDEPENDENT_AMBULATORY_CARE_PROVIDER_SITE_OTHER): Payer: PPO | Admitting: Internal Medicine

## 2020-08-23 DIAGNOSIS — F22 Delusional disorders: Secondary | ICD-10-CM | POA: Diagnosis not present

## 2020-08-23 DIAGNOSIS — F29 Unspecified psychosis not due to a substance or known physiological condition: Secondary | ICD-10-CM | POA: Diagnosis not present

## 2020-08-23 DIAGNOSIS — F419 Anxiety disorder, unspecified: Secondary | ICD-10-CM | POA: Diagnosis not present

## 2020-08-23 DIAGNOSIS — F331 Major depressive disorder, recurrent, moderate: Secondary | ICD-10-CM | POA: Diagnosis not present

## 2020-08-23 MED ORDER — QUETIAPINE FUMARATE 25 MG PO TABS: 25.0000 mg | ORAL_TABLET | Freq: Two times a day (BID) | ORAL | 2 refills | Status: DC

## 2020-08-23 NOTE — Progress Notes (Signed)
Virtual Visit via [place]   This visit type was conducted due to national recommendations for restrictions regarding the COVID-19 pandemic (e.g. social distancing).  This format is felt to be most appropriate for this patient at this time.  All issues noted in this document were discussed and addressed.  No physical exam was performed (except for noted visual exam findings with Video Visits).   I connected with@ on 08/23/20 at  2:30 PM EST by a video enabled telemedicine applicationand verified that I am speaking with the correct person using two identifiers. Location patient: home Location provider: work or home office Persons participating in the virtual visit: patient, provider and patient's daughter Audrey Duran   I discussed the limitations, risks, security and privacy concerns of performing an evaluation and management service by telephone and the availability of in person appointments. I also discussed with the patient that there may be a patient responsible charge related to this service. The patient expressed understanding and agreed to proceed.  Reason for visit: follow up on depression with psychosis /paranoia  HPI:  Audrey Duran is an 86 yr old single female who suffers  from psychosis with persistent visual hallucinations of a  paranoid nature  that have persisted for the past year or more due to patient's refusal to seek psychiatric care or take medications. Audrey Chiappone was involuntarily committed by her daughter in June 2021 to [place] [facility] and transferred to another facility for treatment.  She was treated for several days released to live independently, but refused to take medications or follow up with psychiatry.  She did finally consent to enter into a therapeutic relationship with counsellor Miguel Dibble [place] , who agreed recently  that patient's psychosis had actually worsened.  Marland Kitchen   She was living alone in [place] until last week, when she was threatened with  eviction from her trailer park due to recurrent disturbances of the peace which she had caused by calling the police.  She has been living with her daughter Audrey Duran in [place] for the past week and feels much better.  She has not had any of the visual hallucinations since changing her locale.  She had a follow up with her therapist this morning and states that she is willing to start taking seroquel twice daily if I think that would be the right thing to do.  She is also willing to be evaluated by Dr Nicolasa Ducking and has agreed to stay with her daughter for the foreseeable future until such time as her prognosis for returning to independent living can be assessed.      Her son died in 03/31/23 from a massive MI and she continues to grieve the loss of her son .  However she seems much more upbeat today.  Her mood is less anxious,  She is very interactive and smiling.  Her daughter has been very supportive and states that she is eating 3 meals per day        ROS: See pertinent positives and negatives per HPI.  Past Medical History:  Diagnosis Date  . Anemia   . Atrial fibrillation ([place])   . COPD (chronic obstructive pulmonary disease) ([place])   . Coronary artery disease 1995   s/p AMI , no history of stents,  [name]  . Hyperlipidemia   . Ischemic cardiomyopathy Dec 2011   ETT Sestamibi study apical scar, no ischemia, [name]  . Myocardial infarction ([place])   . Recurrent HSV (herpes simplex virus)  Past Surgical History:  Procedure Laterality Date  . ABDOMINAL HYSTERECTOMY  june 2014  . BREAST EXCISIONAL BIOPSY Left 1990's   neg  . 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 CATARACT EXTRACTION  2004, 2006  . JOINT REPLACEMENT  July 2013   total hip , Sabra Heck  . LAPAROSCOPIC APPENDECTOMY N/A 02/20/2020   Procedure: APPENDECTOMY LAPAROSCOPIC;  Surgeon: Jules Husbands, MD;  Location: [facility] ORS;  Service: General;  Laterality: N/A;    Family History  Problem Relation Age of Onset   . Cancer Mother 60       Pancreatic   . Heart disease Father   . Cancer Sister 60       Breast Cancer  . Breast cancer Sister 30    SOCIAL HX:  Has been living alone for years   reports that she quit smoking about 28 years ago. She has never used smokeless tobacco. She reports that she does not drink alcohol and does not use drugs.  Current Outpatient Medications:  .  acyclovir (ZOVIRAX) 400 MG tablet, Take 1 tablet (400 mg total) by mouth daily., Disp: 90 tablet, Rfl: 1 .  alendronate (FOSAMAX) 70 MG tablet, Take 1 tablet (70 mg total) by mouth every 7 (seven) days. Take with a full glass of water on an empty stomach., Disp: 12 tablet, Rfl: 3 .  ALPRAZolam (XANAX) 0.5 MG tablet, Take 1 tablet (0.5 mg total) by mouth at bedtime as needed., Disp: 30 tablet, Rfl: 3 .  aspirin 81 MG tablet, Take 81 mg by mouth at bedtime., Disp: , Rfl:  .  carbonyl iron (FEOSOL) 45 MG TABS tablet, Take one by mouth daily, Disp: , Rfl:  .  Cholecalciferol (VITAMIN D3) 3000 UNITS TABS, Take 1 tablet by mouth daily. Take one by mouth daily, Disp: 90 tablet, Rfl: 3 .  cyanocobalamin 1000 MCG tablet, Take one by mouth daily, Disp: , Rfl:  .  furosemide (LASIX) 20 MG tablet, Take 0.5 tablets (10 mg total) by mouth daily., Disp: 45 tablet, Rfl: 1 .  losartan (COZAAR) 25 MG tablet, Take 1 tablet (25 mg total) by mouth daily., Disp: 90 tablet, Rfl: 1 .  magnesium oxide (MAG-OX) 400 MG tablet, Take 400 mg by mouth daily., Disp: , Rfl:  .  meclizine (ANTIVERT) 50 MG tablet, Take 50 mg by mouth 3 (three) times daily as needed., Disp: , Rfl:  .  Omega-3 Fatty Acids (FISH OIL) 1200 MG CAPS, Take 1 capsule by mouth daily. Take one by mouth daily, Disp: , Rfl:  .  pravastatin (PRAVACHOL) 80 MG tablet, Take 1 tablet (80 mg total) by mouth daily., Disp: 90 tablet, Rfl: 1 .  timolol (TIMOPTIC) 0.5 % ophthalmic solution, Place 1 drop into both eyes 2 (two) times daily. , Disp: , Rfl:  .  QUEtiapine (SEROQUEL) 25 MG tablet, Take  1 tablet (25 mg total) by mouth 2 (two) times daily., Disp: 60 tablet, Rfl: 2  EXAM:  VITALS per patient if applicable:  GENERAL: alert, oriented, appears well and in no acute distress  HEENT: atraumatic, conjunttiva clear, no obvious abnormalities on inspection of external nose and ears  NECK: normal movements of the head and neck  LUNGS: on inspection no signs of respiratory distress, breathing rate appears normal, no obvious gross SOB, gasping or wheezing  CV: no obvious cyanosis  Audrey: moves all visible extremities without noticeable abnormality  PSYCH/NEURO: pleasant and cooperative, no obvious depression or anxiety, speech and thought  processing grossly intact  ASSESSMENT AND PLAN:  Discussed the following assessment and plan:  Paranoid delusion ([place])  Psychosis, 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  She has agreed to continue seroquel, increased to bid,  see Dr Nicolasa Ducking,  And remain with Audrey Duran for the foreseeable future     I discussed the assessment and treatment plan with the patient. The patient was provided an opportunity to ask questions and all were answered. The patient agreed with the plan and demonstrated an understanding of the instructions.   The patient was advised to call back or seek an in-person evaluation if the symptoms worsen or if the condition fails to improve as anticipated.    I spent 30 mintutes dedicated to the care of this patient on the date of this encounter to include pre-visit review of his medical history,  Face-to-face time with the patient , and post visit ordering of testing and therapeutics. Crecencio Mc, MD

## 2020-08-23 NOTE — Assessment & Plan Note (Signed)
Her paranoid visual hallucinations did not follow her to [place] when she moved in with her daughter last week.  She has agreed to continue seroquel, increased to bid,  see Dr Nicolasa Ducking,  And remain with [name] for the foreseeable future

## 2020-08-27 ENCOUNTER — Ambulatory Visit: Payer: PPO | Admitting: Internal Medicine

## 2020-09-06 DIAGNOSIS — F29 Unspecified psychosis not due to a substance or known physiological condition: Secondary | ICD-10-CM | POA: Diagnosis not present

## 2020-09-06 DIAGNOSIS — F419 Anxiety disorder, unspecified: Secondary | ICD-10-CM | POA: Diagnosis not present

## 2020-09-06 DIAGNOSIS — F331 Major depressive disorder, recurrent, moderate: Secondary | ICD-10-CM | POA: Diagnosis not present

## 2020-09-09 ENCOUNTER — Other Ambulatory Visit: Payer: Self-pay | Admitting: Internal Medicine

## 2020-09-09 DIAGNOSIS — B009 Herpesviral infection, unspecified: Secondary | ICD-10-CM

## 2020-09-10 ENCOUNTER — Telehealth: Payer: Self-pay | Admitting: Internal Medicine

## 2020-09-10 NOTE — Telephone Encounter (Signed)
Patient called in wanted to talk about whose on her DPR

## 2020-09-16 NOTE — Telephone Encounter (Signed)
FYI  Spoke with pt and clarified that her daughter, Joseph Art is not on the her DPR. Pt stated that she only wants her sister Virl Cagey on it.

## 2020-09-24 ENCOUNTER — Telehealth: Payer: Self-pay | Admitting: Internal Medicine

## 2020-09-24 NOTE — Telephone Encounter (Signed)
Spoke with Audrey Duran to let her know that she will need to discuss this with Dr. Nicolasa Ducking. Audrey Duran stated that she sees her on Thursday.

## 2020-09-24 NOTE — Telephone Encounter (Signed)
Spoke with pt and she is wanting to know if she can just take one tablet of Seroquel daily instead of two. Pt stated that the two tablets seems to be too much and it is causing her to not be able to walk straight and she has less energy.

## 2020-09-24 NOTE — Telephone Encounter (Signed)
She needs to discuss that with Dr Nicolasa Ducking,  Not me. Dr Nicolasa Ducking is managing her psychiatric issues now

## 2020-09-24 NOTE — Telephone Encounter (Signed)
Pt called she would like to discuss the QUEtiapine (SEROQUEL) 25 MG tablet she feels that taking it two a day is way to much for her

## 2020-09-26 DIAGNOSIS — F22 Delusional disorders: Secondary | ICD-10-CM | POA: Diagnosis not present

## 2020-09-26 DIAGNOSIS — F4322 Adjustment disorder with anxiety: Secondary | ICD-10-CM | POA: Diagnosis not present

## 2020-10-08 ENCOUNTER — Encounter: Payer: Self-pay | Admitting: Internal Medicine

## 2020-10-08 ENCOUNTER — Ambulatory Visit (INDEPENDENT_AMBULATORY_CARE_PROVIDER_SITE_OTHER): Payer: PPO | Admitting: Internal Medicine

## 2020-10-08 ENCOUNTER — Other Ambulatory Visit: Payer: Self-pay

## 2020-10-08 VITALS — BP 118/64 | HR 53 | Temp 97.7°F | Resp 14 | Ht 63.0 in | Wt 123.0 lb

## 2020-10-08 DIAGNOSIS — I700 Atherosclerosis of aorta: Secondary | ICD-10-CM | POA: Insufficient documentation

## 2020-10-08 DIAGNOSIS — N1831 Chronic kidney disease, stage 3a: Secondary | ICD-10-CM | POA: Diagnosis not present

## 2020-10-08 DIAGNOSIS — R928 Other abnormal and inconclusive findings on diagnostic imaging of breast: Secondary | ICD-10-CM

## 2020-10-08 DIAGNOSIS — F29 Unspecified psychosis not due to a substance or known physiological condition: Secondary | ICD-10-CM

## 2020-10-08 DIAGNOSIS — D649 Anemia, unspecified: Secondary | ICD-10-CM | POA: Diagnosis not present

## 2020-10-08 DIAGNOSIS — I10 Essential (primary) hypertension: Secondary | ICD-10-CM

## 2020-10-08 DIAGNOSIS — F411 Generalized anxiety disorder: Secondary | ICD-10-CM

## 2020-10-08 LAB — IBC + FERRITIN
Ferritin: 387.4 ng/mL — ABNORMAL HIGH (ref 10.0–291.0)
Iron: 83 ug/dL (ref 42–145)
Saturation Ratios: 31.5 % (ref 20.0–50.0)
Transferrin: 188 mg/dL — ABNORMAL LOW (ref 212.0–360.0)

## 2020-10-08 LAB — CBC WITH DIFFERENTIAL/PLATELET
Basophils Absolute: 0.1 10*3/uL (ref 0.0–0.1)
Basophils Relative: 0.6 % (ref 0.0–3.0)
Eosinophils Absolute: 0.2 10*3/uL (ref 0.0–0.7)
Eosinophils Relative: 2.4 % (ref 0.0–5.0)
HCT: 36.9 % (ref 36.0–46.0)
Hemoglobin: 12.4 g/dL (ref 12.0–15.0)
Lymphocytes Relative: 20.5 % (ref 12.0–46.0)
Lymphs Abs: 2 10*3/uL (ref 0.7–4.0)
MCHC: 33.6 g/dL (ref 30.0–36.0)
MCV: 92.7 fl (ref 78.0–100.0)
Monocytes Absolute: 0.7 10*3/uL (ref 0.1–1.0)
Monocytes Relative: 6.7 % (ref 3.0–12.0)
Neutro Abs: 6.8 10*3/uL (ref 1.4–7.7)
Neutrophils Relative %: 69.8 % (ref 43.0–77.0)
Platelets: 200 10*3/uL (ref 150.0–400.0)
RBC: 3.98 Mil/uL (ref 3.87–5.11)
RDW: 13.3 % (ref 11.5–15.5)
WBC: 9.8 10*3/uL (ref 4.0–10.5)

## 2020-10-08 LAB — COMPREHENSIVE METABOLIC PANEL
ALT: 15 U/L (ref 0–35)
AST: 15 U/L (ref 0–37)
Albumin: 3.6 g/dL (ref 3.5–5.2)
Alkaline Phosphatase: 62 U/L (ref 39–117)
BUN: 27 mg/dL — ABNORMAL HIGH (ref 6–23)
CO2: 28 mEq/L (ref 19–32)
Calcium: 9.5 mg/dL (ref 8.4–10.5)
Chloride: 101 mEq/L (ref 96–112)
Creatinine, Ser: 1.13 mg/dL (ref 0.40–1.20)
GFR: 45.06 mL/min — ABNORMAL LOW (ref >60.00)
Glucose, Bld: 106 mg/dL — ABNORMAL HIGH (ref 70–99)
Potassium: 4.2 mEq/L (ref 3.5–5.1)
Sodium: 136 mEq/L (ref 135–145)
Total Bilirubin: 0.4 mg/dL (ref 0.2–1.2)
Total Protein: 6.2 g/dL (ref 6.0–8.3)

## 2020-10-08 LAB — B12 AND FOLATE PANEL
Folate: 10.2 ng/mL (ref >5.9)
Vitamin B-12: >1506 pg/mL — ABNORMAL HIGH (ref 211–911)

## 2020-10-08 LAB — LIPID PANEL
Cholesterol: 143 mg/dL (ref 0–200)
HDL: 51.6 mg/dL (ref >39.00)
LDL Cholesterol: 69 mg/dL (ref 0–99)
NonHDL: 91.31
Total CHOL/HDL Ratio: 3
Triglycerides: 112 mg/dL (ref 0.0–149.0)
VLDL: 22.4 mg/dL (ref 0.0–40.0)

## 2020-10-08 MED ORDER — ROSUVASTATIN CALCIUM 20 MG PO TABS: 20.0000 mg | ORAL_TABLET | Freq: Every day | ORAL | 3 refills | Status: DC

## 2020-10-08 MED ORDER — TELMISARTAN 20 MG PO TABS: 20.0000 mg | ORAL_TABLET | Freq: Every day | ORAL | 3 refills | Status: DC

## 2020-10-08 NOTE — Patient Instructions (Addendum)
You are  Doing well   2 medication  Changes today:    telmisartan instead of losartan:  For insurance purposes.  This is a blood pressure medication   Rosuvastatin instead of pravastatin :  You need a higher potency cholesterol medication Because you have aortic atherosclerosis  Noted on your CT scan from last July, and this is more protective   Atherosclerosis  Atherosclerosis is narrowing and hardening of the arteries. Arteries are blood vessels that carry blood from the heart to all parts of the body. This blood contains oxygen. Arteries can become narrow or blocked from inflammation or from a buildup of fat, cholesterol, calcium, and other substances (plaque). Plaque decreases the amount of blood that can flow through the artery. Atherosclerosis can affect any artery in your body, including:  Heart arteries. Damage to these arteries may lead to coronary artery disease, which can cause a heart attack.  Brain arteries. Damage to these arteries may cause a stroke.  Leg, arm, and pelvis arteries. Peripheral artery disease (PAD) may result from damage to these arteries.  Kidney arteries. Kidney (renal) failure may result from damage to kidney arteries. Treatment may slow the disease and prevent further damage to your heart, brain, peripheral arteries, and kidneys. What are the causes? This condition develops slowly over many years. The inner layers of your arteries become damaged and allow the gradual buildup of plaque. The exact cause of atherosclerosis is not fully understood. Symptoms of atherosclerosis do not occur until an artery becomes narrow or blocked. What increases the risk? The following factors may make you more likely to develop this condition:  Being middle-aged or older.  Certain medical conditions, including: ? High blood pressure. ? High cholesterol. ? High blood fats (triglycerides). ? Diabetes. ? Sleep apnea.  A family history of atherosclerosis.  Being  overweight.  Using products that contain tobacco or nicotine.  Not exercising enough (sedentary lifestyle).  Having a substance in your blood called C-reactive protein (CRP). This is a sign of increased levels of inflammation in your body.  Being stressed.  Drinking too much alcohol or using drugs, such as cocaine or methamphetamine. What are the signs or symptoms? Symptoms of atherosclerosis may not be obvious until there is damage to an area of your body that is not getting enough blood. Sometimes, atherosclerosis does not cause symptoms. Symptoms of this condition include:  Coronary artery disease. This may cause chest pain and shortness of breath.  Decreased blood supply to your brain, which may cause a stroke. Signs of a stroke may include sudden: ? Weakness or numbness in your face, arm, or leg, especially on one side of your body. ? Trouble walking or difficulty moving your arms or legs. ? Loss of balance or coordination. ? Confusion. ? Slurred speech (dysarthria). ? Trouble speaking, or trouble understanding speech, or both (aphasia). ? Vision changes in one or both eyes. This may be double vision, blurred vision, or loss of vision. ? Severe headache with no known cause. The headache is often described as the worst headache ever experienced.  PAD, which may cause pain and numbness, often in your legs and hips.  Renal failure. This may cause tiredness, problems with urination, swelling, and itchy skin. How is this diagnosed? This condition is diagnosed based on your medical history and a physical exam. During the exam, your health care provider will:  Check your pulse in different places.  Listen for a "whooshing" sound over your arteries (bruit). You may also have  tests, such as:  Blood tests to check your levels of cholesterol, triglycerides, and CRP.  Electrocardiogram (ECG) to check for heart damage.  Chest X-ray to see if you have an enlarged heart, which is a  sign of heart failure.  Stress test to see how your heart reacts to exercise.  Echocardiogram to get images of the inside of your heart.  Ankle-brachial index to compare blood pressure in your arms to blood pressure in your ankles.  Ultrasound of your peripheral arteries to check blood flow.  CT scan to check for damage to your heart or brain.  X-rays of blood vessels after dye has been injected (angiogram) to check blood flow. How is this treated? This condition is treated with lifestyle changes as the first step. These may include:  Changing your diet.  Losing weight.  Reducing stress.  Exercising and being physically active more regularly.  Quitting smoking. You may also need medicine to:  Lower triglycerides and cholesterol.  Control blood pressure.  Prevent blood clots.  Lower inflammation in your body.  Control your blood sugar. Sometimes, surgery is needed to:  Remove plaque from an artery (endarterectomy).  Open or widen a narrowed heart artery (angioplasty).  Create a new path for your blood with one of these procedures: ? Heart (coronary) artery bypass graft surgery. ? Peripheral artery bypass graft surgery. Follow these instructions at home: Eating and drinking  Eat a heart-healthy diet. Talk with your health care provider or a diet and nutrition specialist (dietitian) if you need help. A heart-healthy diet involves: ? Limiting unhealthy fats and increasing healthy fats. Some examples of healthy fats are olive oil and canola oil. ? Eating plant-based foods, such as fruits, vegetables, nuts, whole grains, and legumes (such as peas and lentils).  If you drink alcohol: ? Limit how much you use to:  0-1 drink a day for nonpregnant women.  0-2 drinks a day for men. ? Be aware of how much alcohol is in your drink. In the [place] one drink equals one 12 oz bottle of beer (355 mL), one 5 oz glass of wine (148 mL), or one 1 oz glass of hard liquor (44 mL).    Lifestyle  Maintain a healthy weight. Lose weight if your health care provider says that you need to do that.  Follow an exercise program as told by your health care provider.  Do not use any products that contain nicotine or tobacco, such as cigarettes, e-cigarettes, and chewing tobacco. If you need help quitting, ask your health care provider.  Do not use drugs.   General instructions  Take over-the-counter and prescription medicines only as told by your health care provider.  Manage other health conditions as told.  Keep all follow-up visits as told by your health care provider. This is important. Contact a health care provider if you have:  An irregular heartbeat.  Unexplained fatigue.  Trouble urinating, or you are producing less urine or foamy urine.  Swelling of your hands or feet, or itchy skin.  Unexplained pain or numbness in your legs or hips. Get help right away if:  You have any symptoms of a heart attack. These may be: ? Chest pain. This includes squeezing chest pain that may feel like indigestion (angina). ? Shortness of breath. ? Pain in your neck, jaw, arms, back, or stomach. ? Cold sweat. ? Nausea. ? Light-headedness.  You have any symptoms of a stroke. "BE FAST" is an easy way to remember the main warning  signs of a stroke: ? B - Balance. Signs are dizziness, sudden trouble walking, or loss of balance. ? E - Eyes. Signs are trouble seeing or a sudden change in vision. ? F - Face. Signs are sudden weakness or numbness of the face, or the face or eyelid drooping on one side. ? A - Arms. Signs are weakness or numbness in an arm. This happens suddenly and usually on one side of the body. ? S - Speech. Signs are sudden trouble speaking, slurred speech, or trouble understanding what people say. ? T - Time. Time to call emergency services. Write down what time symptoms started.  You have other signs of a stroke, such as: ? A sudden, severe headache with no  known cause. ? Nausea or vomiting. ? Seizure. These symptoms may represent a serious problem that is an emergency. Do not wait to see if the symptoms will go away. Get medical help right away. Call your local emergency services (911 in the [place] Do not drive yourself to the hospital. Summary  Atherosclerosis is narrowing and hardening of the arteries.  Arteries can become narrow from inflammation or from a buildup of fat, cholesterol, calcium, and other substances (plaque).  This condition may not cause any symptoms. If you do have symptoms, they are caused by damage to an area of your body that is not getting enough blood.  Treatment starts with lifestyle changes and may include medicines. In some cases, surgery is needed.  Get help right away if you have any symptoms of a heart attack or stroke. This information is not intended to replace advice given to you by your health care provider. Make sure you discuss any questions you have with your health care provider. Document Revised: 05/22/2019 Document Reviewed: 05/22/2019 Elsevier Patient Education  [place].

## 2020-10-08 NOTE — Progress Notes (Signed)
Subjective:  Patient ID: Audrey Duran, female    DOB: Oct 19, 1934  Age: 86 y.o. MRN: [id number]  CC: The primary encounter diagnosis was Stage 3a chronic kidney disease ([place]). 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  HPI Audrey Duran presents for follow up on chronic issues  This visit occurred during the SARS-CoV-2 public health emergency.  Safety protocols were in place, including screening questions prior to the visit, additional usage of staff PPE, and extensive cleaning of exam room while observing appropriate contact time as indicated for disinfecting solutions.   Seeing Dr Nicolasa Ducking for management of paranoid  delusional disorder .  seroquel has been stopped due to intolerance and citalopram started. She is calm today and has no complaints  Reviewed findings of prior CT scan today noting aortic atherosclerosis. ..  Patient is willing to  Initiate higher potency statin theapry with change rom provastatin to rosuvastatin  .   Feeling great ,  Has gained 3 lbs   Outpatient Medications Prior to Visit  Medication Sig Dispense Refill  . acyclovir (ZOVIRAX) 400 MG tablet Take 1 tablet by mouth daily 90 tablet 0  . alendronate (FOSAMAX) 70 MG tablet Take 1 tablet (70 mg total) by mouth every 7 (seven) days. Take with a full glass of water on an empty stomach. 12 tablet 3  . ALPRAZolam (XANAX) 0.5 MG tablet Take 1 tablet (0.5 mg total) by mouth at bedtime as needed. 30 tablet 3  . aspirin 81 MG tablet Take 81 mg by mouth at bedtime.    . carbonyl iron (FEOSOL) 45 MG TABS tablet Take one by mouth daily    . Cholecalciferol (VITAMIN D3) 3000 UNITS TABS Take 1 tablet by mouth daily. Take one by mouth daily 90 tablet 3  . citalopram (CELEXA) 10 MG tablet Take by mouth every morning.    . cyanocobalamin 1000 MCG tablet Take  one by mouth daily    . furosemide (LASIX) 20 MG tablet Take 1/2 tablet by mouth (10mg ) daily 45 tablet 0  . magnesium oxide (MAG-OX) 400 MG tablet Take 400 mg by mouth daily.    . meclizine (ANTIVERT) 50 MG tablet Take 50 mg by mouth 3 (three) times daily as needed.    . Omega-3 Fatty Acids (FISH OIL) 1200 MG CAPS Take 1 capsule by mouth daily. Take one by mouth daily    . risperiDONE (RISPERDAL) 0.5 MG tablet Take 0.5 mg by mouth at bedtime.    . timolol (TIMOPTIC) 0.5 % ophthalmic solution Place 1 drop into both eyes 2 (two) times daily.     Marland Kitchen losartan (COZAAR) 25 MG tablet Take 1 tablet (25 mg total) by mouth daily. 90 tablet 1  . pravastatin (PRAVACHOL) 80 MG tablet Take 1 tablet (80 mg total) by mouth daily. 90 tablet 1  . QUEtiapine (SEROQUEL) 25 MG tablet Take 1 tablet (25 mg total) by mouth 2 (two) times daily. (Patient not taking: Reported on 10/08/2020) 60 tablet 2   No facility-administered medications prior to visit.    Review of Systems;  Patient denies headache, fevers, malaise, unintentional weight loss, skin rash, eye pain, sinus congestion and sinus pain, sore throat, dysphagia,  hemoptysis , cough, dyspnea, wheezing, chest pain, palpitations, orthopnea, edema, abdominal pain, nausea, melena, diarrhea, constipation, flank pain, dysuria, hematuria, urinary  Frequency, nocturia, numbness, tingling, seizures,  Focal weakness, Loss of  consciousness,  Tremor, insomnia, depression, anxiety, and suicidal ideation.      Objective:  BP 118/64 (BP Location: Left Arm, Patient Position: Sitting, Cuff Size: Normal)   Pulse (!) 53   Temp 97.7 F (36.5 C) (Oral)   Resp 14   Ht 5\' 3"  (1.6 m)   Wt 123 lb (55.8 kg)   SpO2 93%   BMI 21.79 kg/m   BP Readings from Last 3 Encounters:  10/08/20 118/64  05/02/20 (!) 148/64  03/11/20 112/66    Wt Readings from Last 3 Encounters:  10/08/20 123 lb (55.8 kg)  08/23/20 120 lb (54.4 kg)  04/05/20 123 lb (55.8 kg)    General appearance:  alert, cooperative and appears stated age Ears: normal TM's and external ear canals both ears Throat: lips, mucosa, and tongue normal; teeth and gums normal Neck: no adenopathy, no carotid bruit, supple, symmetrical, trachea midline and thyroid not enlarged, symmetric, no tenderness/mass/nodules Back: symmetric, no curvature. ROM normal. No CVA tenderness. Lungs: clear to auscultation bilaterally Heart: regular rate and rhythm, S1, S2 normal, no murmur, click, rub or gallop Abdomen: soft, non-tender; bowel sounds normal; no masses,  no organomegaly Pulses: 2+ and symmetric Skin: Skin color, texture, turgor normal. No rashes or lesions Lymph nodes: Cervical, supraclavicular, and axillary nodes normal. Psych: affect normal and calm , makes good eye contact. No fidgeting,  Smiles easily.  Denies suicidal thoughts   Lab Results  Component Value Date   HGBA1C 5.8 08/21/2019   HGBA1C 5.9 04/13/2019   HGBA1C 5.8 10/06/2018    Lab Results  Component Value Date   CREATININE 1.13 10/08/2020   CREATININE 0.95 (H) 03/01/2020   CREATININE 0.79 02/27/2020     Assessment & Plan:   Problem List Items Addressed This Visit      Unprioritized   Abnormal mammogram of right breast    Repeat mammogram in Nov 2021 showed stable 5 mm calcifications in the right,  Unchanged       Anemia    Secondary to CKD, now resolved with iron.  Lab Results  Component Value Date   WBC 9.8 10/08/2020   HGB 12.4 10/08/2020   HCT 36.9 10/08/2020   MCV 92.7 10/08/2020   PLT 200.0 10/08/2020         Relevant Orders   IBC + Ferritin (Completed)   CBC with Differential/Platelet (Completed)   B12 and Folate Panel (Completed)   Anxiety state    Improved demeanor /mood on citalopram.       Relevant Medications   citalopram (CELEXA) 10 MG tablet   Aortic atherosclerosis (HCC)    Tolerating pravastatin.  Prior trial of simvastatin not tolerated .  Trial of rosuvastatin       Relevant Medications    telmisartan (MICARDIS) 20 MG tablet   rosuvastatin (CRESTOR) 20 MG tablet   Other Relevant Orders   Lipid panel (Completed)   CKD (chronic kidney disease) stage 3, GFR 30-59 ml/min (HCC) - Primary   Relevant Orders   Comprehensive metabolic panel (Completed)   Erythropoietin   PTH, Intact and Calcium (Completed)   Essential hypertension    Previously managed with losartan for concurrent CKD.  Changing to telmisartan.  Renal function is stable.   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 Due to side effects.  Currently taking citalopram         I have discontinued Sevilla L. Fortune's losartan, pravastatin, and QUEtiapine. I am also having her start on telmisartan and rosuvastatin. Additionally, I am having her maintain her aspirin, carbonyl iron, magnesium oxide, Fish Oil, cyanocobalamin, Vitamin D3, timolol, meclizine, ALPRAZolam, alendronate, furosemide, acyclovir, citalopram, and risperiDONE.  Meds ordered this encounter  Medications  . telmisartan (MICARDIS) 20 MG tablet    Sig: Take 1 tablet (20 mg total) by mouth daily.    Dispense:  90 tablet    Refill:  3  . rosuvastatin (CRESTOR) 20 MG tablet    Sig: Take 1 tablet (20 mg total) by mouth daily.    Dispense:  90 tablet    Refill:  3    Medications Discontinued During This Encounter  Medication Reason  . QUEtiapine (SEROQUEL) 25 MG tablet   . losartan (COZAAR) 25 MG tablet   . pravastatin (PRAVACHOL) 80 MG tablet     Follow-up: Return in about 6 months (around 04/10/2021).   Crecencio Mc, MD

## 2020-10-08 NOTE — Assessment & Plan Note (Addendum)
Tolerating pravastatin.  Prior trial of simvastatin not tolerated .  Trial of rosuvastatin

## 2020-10-09 DIAGNOSIS — I10 Essential (primary) hypertension: Secondary | ICD-10-CM | POA: Insufficient documentation

## 2020-10-09 NOTE — Assessment & Plan Note (Signed)
Repeat mammogram in Nov 2021 showed stable 5 mm calcifications in the right,  Unchanged

## 2020-10-09 NOTE — Assessment & Plan Note (Signed)
Secondary to CKD, now resolved with iron.  Lab Results  Component Value Date   WBC 9.8 10/08/2020   HGB 12.4 10/08/2020   HCT 36.9 10/08/2020   MCV 92.7 10/08/2020   PLT 200.0 10/08/2020

## 2020-10-09 NOTE — Assessment & Plan Note (Signed)
Improved demeanor /mood on citalopram.

## 2020-10-09 NOTE — Assessment & Plan Note (Addendum)
Previously managed with losartan for concurrent CKD.  Changing to telmisartan.  Renal function is stable.   Lab Results  Component Value Date   CREATININE 1.13 10/08/2020   Lab Results  Component Value Date   NA 136 10/08/2020   K 4.2 10/08/2020   CL 101 10/08/2020   CO2 28 10/08/2020

## 2020-10-09 NOTE — Assessment & Plan Note (Signed)
Secondary to anxiety,  Not managed with seroquel. Due to side effects.  Currently taking citalopram

## 2020-10-10 ENCOUNTER — Telehealth: Payer: Self-pay

## 2020-10-10 ENCOUNTER — Other Ambulatory Visit (INDEPENDENT_AMBULATORY_CARE_PROVIDER_SITE_OTHER): Payer: PPO

## 2020-10-10 DIAGNOSIS — D649 Anemia, unspecified: Secondary | ICD-10-CM

## 2020-10-10 LAB — ERYTHROPOIETIN: Erythropoietin: 10.5 m[IU]/mL (ref 2.6–18.5)

## 2020-10-10 LAB — PTH, INTACT AND CALCIUM
Calcium: 9.5 mg/dL (ref 8.6–10.4)
PTH: 93 pg/mL — ABNORMAL HIGH (ref 16–77)

## 2020-10-10 LAB — FECAL OCCULT BLOOD, IMMUNOCHEMICAL: Fecal Occult Bld: POSITIVE — AB

## 2020-10-10 LAB — EXTRA SPECIMEN

## 2020-10-10 NOTE — Telephone Encounter (Signed)
[facility] lab called in stating that Audrey Duran IFOB is positive. They are releasing the records now.

## 2020-10-11 DIAGNOSIS — F22 Delusional disorders: Secondary | ICD-10-CM | POA: Diagnosis not present

## 2020-10-11 DIAGNOSIS — F4322 Adjustment disorder with anxiety: Secondary | ICD-10-CM | POA: Diagnosis not present

## 2020-10-22 DIAGNOSIS — F29 Unspecified psychosis not due to a substance or known physiological condition: Secondary | ICD-10-CM | POA: Diagnosis not present

## 2020-10-22 DIAGNOSIS — F331 Major depressive disorder, recurrent, moderate: Secondary | ICD-10-CM | POA: Diagnosis not present

## 2020-10-22 DIAGNOSIS — F419 Anxiety disorder, unspecified: Secondary | ICD-10-CM | POA: Diagnosis not present

## 2020-10-31 DIAGNOSIS — Z79899 Other long term (current) drug therapy: Secondary | ICD-10-CM | POA: Diagnosis not present

## 2020-10-31 DIAGNOSIS — I10 Essential (primary) hypertension: Secondary | ICD-10-CM | POA: Diagnosis not present

## 2020-10-31 DIAGNOSIS — R001 Bradycardia, unspecified: Secondary | ICD-10-CM | POA: Diagnosis not present

## 2020-10-31 DIAGNOSIS — E785 Hyperlipidemia, unspecified: Secondary | ICD-10-CM | POA: Diagnosis not present

## 2020-11-08 DIAGNOSIS — F4322 Adjustment disorder with anxiety: Secondary | ICD-10-CM | POA: Diagnosis not present

## 2020-11-08 DIAGNOSIS — F22 Delusional disorders: Secondary | ICD-10-CM | POA: Diagnosis not present

## 2020-11-11 ENCOUNTER — Other Ambulatory Visit: Payer: Self-pay | Admitting: Internal Medicine

## 2020-11-11 NOTE — Telephone Encounter (Signed)
RX Refill:xanax Last Seen:10-08-20 Last ordered:04-05-20

## 2020-11-14 ENCOUNTER — Other Ambulatory Visit: Payer: Self-pay

## 2020-11-14 DIAGNOSIS — B009 Herpesviral infection, unspecified: Secondary | ICD-10-CM

## 2020-11-14 MED ORDER — FUROSEMIDE 20 MG PO TABS: ORAL_TABLET | ORAL | 0 refills | Status: DC

## 2020-11-14 MED ORDER — ACYCLOVIR 400 MG PO TABS: 400.0000 mg | ORAL_TABLET | Freq: Every day | ORAL | 0 refills | Status: DC

## 2020-11-21 DIAGNOSIS — F331 Major depressive disorder, recurrent, moderate: Secondary | ICD-10-CM | POA: Diagnosis not present

## 2020-11-21 DIAGNOSIS — F29 Unspecified psychosis not due to a substance or known physiological condition: Secondary | ICD-10-CM | POA: Diagnosis not present

## 2020-11-21 DIAGNOSIS — F419 Anxiety disorder, unspecified: Secondary | ICD-10-CM | POA: Diagnosis not present

## 2020-11-26 DIAGNOSIS — F22 Delusional disorders: Secondary | ICD-10-CM | POA: Diagnosis not present

## 2020-11-26 DIAGNOSIS — F4322 Adjustment disorder with anxiety: Secondary | ICD-10-CM | POA: Diagnosis not present

## 2020-11-28 ENCOUNTER — Telehealth: Payer: Self-pay | Admitting: Internal Medicine

## 2020-11-28 NOTE — Telephone Encounter (Signed)
Left message for patient to call back and schedule Medicare Annual Wellness Visit (AWV)   Please schedule on the [place] [facility] template  Last AWV 85/02/77; please schedule at anytime   This should be a 45 minute visit

## 2020-12-03 ENCOUNTER — Ambulatory Visit (INDEPENDENT_AMBULATORY_CARE_PROVIDER_SITE_OTHER): Payer: PPO

## 2020-12-03 VITALS — Ht 63.0 in | Wt 123.0 lb

## 2020-12-03 DIAGNOSIS — Z0000 Encounter for general adult medical examination without abnormal findings: Secondary | ICD-10-CM

## 2020-12-03 NOTE — Progress Notes (Signed)
Subjective:   Audrey Duran is a 86 y.o. female who presents for Medicare Annual (Subsequent) preventive examination.  Review of Systems    No ROS.  Medicare Wellness Virtual Visit.  Visual/audio telehealth visit, UTA vital signs.   See social history for additional risk factors.   Cardiac Risk Factors include: advanced age (>67men, >43 women);hypertension     Objective:    Today's Vitals   12/03/20 1236  Weight: 123 lb (55.8 kg)  Height: 5\' 3"  (1.6 m)   Body mass index is 21.79 kg/m.  Advanced Directives 12/03/2020 02/20/2020 02/20/2020 02/20/2020 02/19/2020 01/13/2020 05/06/2017  Does Patient Have a Medical Advance Directive? No No No No No Yes Yes  Type of Advance Directive - - - - - Catering manager;Living will  Does patient want to make changes to medical advance directive? - - - - - - No - Patient declined  Copy of [place] in Chart? - - - - - - No - copy requested  Would patient like information on creating a medical advance directive? No - Patient declined No - Patient declined No - Patient declined - No - Patient declined - -    Current Medications (verified) Outpatient Encounter Medications as of 12/03/2020  Medication Sig  . acyclovir (ZOVIRAX) 400 MG tablet Take 1 tablet (400 mg total) by mouth daily.  Marland Kitchen alendronate (FOSAMAX) 70 MG tablet Take 1 tablet (70 mg total) by mouth every 7 (seven) days. Take with a full glass of water on an empty stomach.  . ALPRAZolam (XANAX) 0.5 MG tablet TAKE 1 TABLET BY MOUTH AT BEDTIME AS NEEDED  . aspirin 81 MG tablet Take 81 mg by mouth at bedtime.  . carbonyl iron (FEOSOL) 45 MG TABS tablet Take one by mouth daily  . Cholecalciferol (VITAMIN D3) 3000 UNITS TABS Take 1 tablet by mouth daily. Take one by mouth daily  . citalopram (CELEXA) 10 MG tablet Take by mouth every morning.  . cyanocobalamin 1000 MCG tablet Take one by mouth daily  . furosemide (LASIX) 20 MG tablet  Take 1/2 tablet by mouth (10mg ) daily  . magnesium oxide (MAG-OX) 400 MG tablet Take 400 mg by mouth daily.  . meclizine (ANTIVERT) 50 MG tablet Take 50 mg by mouth 3 (three) times daily as needed.  . Omega-3 Fatty Acids (FISH OIL) 1200 MG CAPS Take 1 capsule by mouth daily. Take one by mouth daily  . risperiDONE (RISPERDAL) 0.5 MG tablet Take 0.5 mg by mouth at bedtime.  . rosuvastatin (CRESTOR) 20 MG tablet Take 1 tablet (20 mg total) by mouth daily.  Marland Kitchen telmisartan (MICARDIS) 20 MG tablet Take 1 tablet (20 mg total) by mouth daily.  . timolol (TIMOPTIC) 0.5 % ophthalmic solution Place 1 drop into both eyes 2 (two) times daily.    No facility-administered encounter medications on file as of 12/03/2020.    Allergies (verified) Azithromycin, Clarithromycin, Diclofenac, Lisinopril, Meloxicam, Nitrofurantoin monohyd macro, Zocor [simvastatin], and Penicillins   History: Past Medical History:  Diagnosis Date  . Anemia   . Atrial fibrillation ([place])   . COPD (chronic obstructive pulmonary disease) ([place])   . Coronary artery disease 1995   s/p AMI , no history of stents,  [name]  . Hyperlipidemia   . Ischemic cardiomyopathy Dec 2011   ETT Sestamibi study apical scar, no ischemia, [name]  . Myocardial infarction ([place])   . Recurrent HSV (herpes simplex virus)    Past Surgical History:  Procedure Laterality Date  . ABDOMINAL HYSTERECTOMY  june 2014  . BREAST EXCISIONAL BIOPSY Left 1990's   neg  . 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 CATARACT EXTRACTION  2004, 2006  . JOINT REPLACEMENT  July 2013   total hip , [place]  . LAPAROSCOPIC APPENDECTOMY N/A 02/20/2020   Procedure: APPENDECTOMY LAPAROSCOPIC;  Surgeon: Jules Husbands, MD;  Location: [facility] ORS;  Service: General;  Laterality: N/A;   Family History  Problem Relation Age of Onset  . Cancer Mother 18       Pancreatic   . Heart disease Father   . Cancer Sister 42       Breast Cancer  . Breast cancer  Sister 8  . Multiple sclerosis Daughter    Social History   Socioeconomic History  . Marital status: Divorced    Spouse name: Not on file  . Number of children: Not on file  . Years of education: Not on file  . Highest education level: Not on file  Occupational History  . Not on file  Tobacco Use  . Smoking status: Former Smoker    Quit date: 01/13/1992    Years since quitting: 28.9  . Smokeless tobacco: Never Used  Substance and Sexual Activity  . Alcohol use: No    Alcohol/week: 0.0 standard drinks  . Drug use: No  . Sexual activity: Not Currently  Other Topics Concern  . Not on file  Social History Narrative  . Not on file   Social Determinants of Health   Financial Resource Strain: Low Risk   . Difficulty of Paying Living Expenses: Not hard at all  Food Insecurity: No Food Insecurity  . Worried About Charity fundraiser in the Last Year: Never true  . Ran Out of Food in the Last Year: Never true  Transportation Needs: No Transportation Needs  . Lack of Transportation (Medical): No  . Lack of Transportation (Non-Medical): No  Physical Activity: Not on file  Stress: No Stress Concern Present  . Feeling of Stress : Not at all  Social Connections: Unknown  . Frequency of Communication with Friends and Family: More than three times a week  . Frequency of Social Gatherings with Friends and Family: More than three times a week  . Attends Religious Services: Not on file  . Active Member of Clubs or Organizations: Not on file  . Attends Archivist Meetings: Not on file  . Marital Status: Not on file    Tobacco Counseling Counseling given: Not Answered   Clinical Intake:  Pre-visit preparation completed: Yes        Diabetes: No  How often do you need to have someone help you when you read instructions, pamphlets, or other written materials from your doctor or pharmacy?: 1 - Never    Interpreter Needed?: No      Activities of Daily  Living In your present state of health, do you have any difficulty performing the following activities: 12/03/2020 02/20/2020  Hearing? N -  Vision? N -  Difficulty concentrating or making decisions? N -  Walking or climbing stairs? N -  Dressing or bathing? N -  Doing errands, shopping? N N  Preparing Food and eating ? N -  Using the Toilet? N -  In the past six months, have you accidently leaked urine? N -  Do you have problems with loss of bowel control? N -  Managing your Medications? N -  Managing  your Finances? N -  Housekeeping or managing your Housekeeping? N -  Some recent data might be hidden    Patient Care Team: Crecencio Mc, MD as PCP - General (Internal Medicine)  Indicate any recent Medical Services you may have received from other than [facility] providers in the past year (date may be approximate).     Assessment:   This is a routine wellness examination for Iyonnah.  I connected with Nickayla today by telephone and verified that I am speaking with the correct person using two identifiers. Location patient: home Location provider: work Persons participating in the virtual visit: patient, Marine scientist.    I discussed the limitations, risks, security and privacy concerns of performing an evaluation and management service by telephone and the availability of in person appointments. The patient expressed understanding and verbally consented to this telephonic visit.    Interactive audio and video telecommunications were attempted between this provider and patient, however failed, due to patient having technical difficulties OR patient did not have access to video capability.  We continued and completed visit with audio only.  Some vital signs may be absent or patient reported.   Hearing/Vision screen  Hearing Screening   125Hz  250Hz  500Hz  1000Hz  2000Hz  3000Hz  4000Hz  6000Hz  8000Hz   Right ear:           Left ear:           Comments: Patient is able to hear conversational tones  without difficulty. No issues reported.   Vision Screening Comments: Followed by [facility] (Dr. Wallace Going)  Wears corrective lenses when reading  Visits every 6 months  Glaucoma; drops in use Cataract extraction, bilateral  Virtual visit    Dietary issues and exercise activities discussed: Current Exercise Habits: Home exercise routine, Type of exercise: calisthenics, Time (Minutes): 60, Frequency (Times/Week): 3, Weekly Exercise (Minutes/Week): 180, Intensity: MildHealthy diet  Good water intake  Goals Addressed            This Visit's Progress   . Maintain Healthy Lifestyle       Stay active Healthy diet      Depression Screen PHQ 2/9 Scores 12/03/2020 08/24/2020 02/13/2020 05/06/2017 04/24/2016 11/07/2014 05/08/2014  PHQ - 2 Score 0 0 0 0 0 0 0  PHQ- 9 Score - 1 0 0 - - -    Fall Risk Fall Risk  12/03/2020 10/08/2020 04/05/2020 03/01/2020 02/13/2020  Falls in the past year? 0 0 0 - 0  Comment - - - - -  Number falls in past yr: 0 - - - -  Injury with Fall? 0 - - - -  Risk Factor Category  - - - - -  Comment - - - - -  Risk for fall due to : - - - - -  Follow up Falls evaluation completed Falls evaluation completed Falls evaluation completed Falls evaluation completed Falls evaluation completed    FALL RISK PREVENTION PERTAINING TO THE HOME: Handrails in use when climbing stairs? Yes Home free of loose throw rugs in walkways, pet beds, electrical cords, etc? Yes  Adequate lighting in your home to reduce risk of falls? Yes   ASSISTIVE DEVICES UTILIZED TO PREVENT FALLS: Use of a cane, walker or w/c? Yes  Grab bars in the bathroom? Yes  Shower chair or bench in shower? Yes  Elevated toilet seat or a handicapped toilet? Yes   TIMED UP AND GO: Was the test performed? No . Virtual visit.   Cognitive Function:  6CIT Screen 12/03/2020 05/06/2017  What Year? 0 points 0 points  What month? 0 points 0 points  What time? 0 points 0 points  Count back from 20 0  points 0 points  Months in reverse 0 points 0 points  Repeat phrase - 0 points  Total Score - 0    Immunizations Immunization History  Administered Date(s) Administered  . Fluad Quad(high Dose 65+) 04/13/2019  . Influenza Split 03/29/2012, 04/26/2014, 05/16/2015  . Influenza, High Dose Seasonal PF 05/06/2017, 04/16/2018, 04/13/2019  . Influenza-Unspecified 06/06/2016, 05/20/2020  . PFIZER(Purple Top)SARS-COV-2 Vaccination 08/03/2019, 08/24/2019, 04/23/2020  . Pneumococcal Conjugate-13 10/26/2013  . Pneumococcal Polysaccharide-23 10/31/2010, 11/14/2015  . Pneumococcal-Unspecified 10/31/2010, 11/14/2015  . Td 08/01/1985  . Tdap 10/27/2011  . Zoster 10/26/2004  . Zoster Recombinat (Shingrix) 11/24/2017, 02/10/2018   Health Maintenance There are no preventive care reminders to display for this patient. Health Maintenance  Topic Date Due  . INFLUENZA VACCINE  02/24/2021  . MAMMOGRAM  06/10/2021  . TETANUS/TDAP  10/26/2021  . DEXA SCAN  Completed  . COVID-19 Vaccine  Completed  . PNA vac Low Risk Adult  Completed  . HPV VACCINES  Aged Out   Colorectal cancer screening: No longer required.   Mammogram status: Completed 06/10/20. Repeat every year  Lung Cancer Screening: (Low Dose CT Chest recommended if Age 7-80 years, 30 pack-year currently smoking OR have quit w/in 15years.) does not qualify.   Hepatitis C Screening: does not qualify.   Vision Screening: Recommended annual ophthalmology exams for early detection of glaucoma and other disorders of the eye. Is the patient up to date with their annual eye exam?  Yes   Dental Screening: Recommended annual dental exams for proper oral hygiene.  Community Resource Referral / Chronic Care Management: CRR required this visit?  No   CCM required this visit?  No      Plan:   Keep all routine maintenance appointments.   Follow up 04/15/21 @ 2:30  I have personally reviewed and noted the following in the patient's chart:    . Medical and social history . Use of alcohol, tobacco or illicit drugs  . Current medications and supplements including opioid prescriptions.  . Functional ability and status . Nutritional status . Physical activity . Advanced directives . List of other physicians . Hospitalizations, surgeries, and ER visits in previous 12 months . Vitals . Screenings to include cognitive, depression, and falls . Referrals and appointments  In addition, I have reviewed and discussed with patient certain preventive protocols, quality metrics, and best practice recommendations. A written personalized care plan for preventive services as well as general preventive health recommendations were provided to patient via mail.     Varney Biles, LPN   [id number]

## 2020-12-03 NOTE — Patient Instructions (Addendum)
Audrey Duran , Thank you for taking time to come for your Medicare Wellness Visit. I appreciate your ongoing commitment to your health goals. Please review the following plan we discussed and let me know if I can assist you in the future.   These are the goals we discussed: Goals    . Maintain Healthy Lifestyle     Stay active Healthy diet       This is a list of the screening recommended for you and due dates:  Health Maintenance  Topic Date Due  . Flu Shot  02/24/2021  . Mammogram  06/10/2021  . Tetanus Vaccine  10/26/2021  . DEXA scan (bone density measurement)  Completed  . COVID-19 Vaccine  Completed  . Pneumonia vaccines  Completed  . HPV Vaccine  Aged Out    Immunizations Immunization History  Administered Date(s) Administered  . Fluad Quad(high Dose 65+) 04/13/2019  . Influenza Split 03/29/2012, 04/26/2014, 05/16/2015  . Influenza, High Dose Seasonal PF 05/06/2017, 04/16/2018, 04/13/2019  . Influenza-Unspecified 06/06/2016, 05/20/2020  . PFIZER(Purple Top)SARS-COV-2 Vaccination 08/03/2019, 08/24/2019, 04/23/2020  . Pneumococcal Conjugate-13 10/26/2013  . Pneumococcal Polysaccharide-23 10/31/2010, 11/14/2015  . Pneumococcal-Unspecified 10/31/2010, 11/14/2015  . Td 08/01/1985  . Tdap 10/27/2011  . Zoster 10/26/2004  . Zoster Recombinat (Shingrix) 11/24/2017, 02/10/2018   Keep all routine maintenance appointments.   Follow up 04/15/21 @ 2:30  Advanced directives: End of life planning; Advance aging; Advanced directives discussed.  Copy of current HCPOA/Living Will requested.    Conditions/risks identified: none new.  Follow up in one year for your annual wellness visit.   Preventive Care 84 Years and Older, Female Preventive care refers to lifestyle choices and visits with your health care provider that can promote health and wellness. What does preventive care include?  A yearly physical exam. This is also called an annual well check.  Dental exams once  or twice a year.  Routine eye exams. Ask your health care provider how often you should have your eyes checked.  Personal lifestyle choices, including:  Daily care of your teeth and gums.  Regular physical activity.  Eating a healthy diet.  Avoiding tobacco and drug use.  Limiting alcohol use.  Practicing safe sex.  Taking low-dose aspirin every day.  Taking vitamin and mineral supplements as recommended by your health care provider. What happens during an annual well check? The services and screenings done by your health care provider during your annual well check will depend on your age, overall health, lifestyle risk factors, and family history of disease. Counseling  Your health care provider may ask you questions about your:  Alcohol use.  Tobacco use.  Drug use.  Emotional well-being.  Home and relationship well-being.  Sexual activity.  Eating habits.  History of falls.  Memory and ability to understand (cognition).  Work and work Statistician.  Reproductive health. Screening  You may have the following tests or measurements:  Height, weight, and BMI.  Blood pressure.  Lipid and cholesterol levels. These may be checked every 5 years, or more frequently if you are over 18 years old.  Skin check.  Lung cancer screening. You may have this screening every year starting at age 84 if you have a 30-pack-year history of smoking and currently smoke or have quit within the past 15 years.  Fecal occult blood test (FOBT) of the stool. You may have this test every year starting at age 84.  Flexible sigmoidoscopy or colonoscopy. You may have a sigmoidoscopy every 5 years or  a colonoscopy every 10 years starting at age 84.  Hepatitis C blood test.  Hepatitis B blood test.  Sexually transmitted disease (STD) testing.  Diabetes screening. This is done by checking your blood sugar (glucose) after you have not eaten for a while (fasting). You may have this  done every 1-3 years.  Bone density scan. This is done to screen for osteoporosis. You may have this done starting at age 84.  Mammogram. This may be done every 1-2 years. Talk to your health care provider about how often you should have regular mammograms. Talk with your health care provider about your test results, treatment options, and if necessary, the need for more tests. Vaccines  Your health care provider may recommend certain vaccines, such as:  Influenza vaccine. This is recommended every year.  Tetanus, diphtheria, and acellular pertussis (Tdap, Td) vaccine. You may need a Td booster every 10 years.  Zoster vaccine. You may need this after age 84.  Pneumococcal 13-valent conjugate (PCV13) vaccine. One dose is recommended after age 84.  Pneumococcal polysaccharide (PPSV23) vaccine. One dose is recommended after age 84. Talk to your health care provider about which screenings and vaccines you need and how often you need them. This information is not intended to replace advice given to you by your health care provider. Make sure you discuss any questions you have with your health care provider. Document Released: 08/09/2015 Document Revised: 04/01/2016 Document Reviewed: 05/14/2015 Elsevier Interactive Patient Education  2017 [place] Prevention in the Home Falls can cause injuries. They can happen to people of all ages. There are many things you can do to make your home safe and to help prevent falls. What can I do on the outside of my home?  Regularly fix the edges of walkways and driveways and fix any cracks.  Remove anything that might make you trip as you walk through a door, such as a raised step or threshold.  Trim any bushes or trees on the path to your home.  Use bright outdoor lighting.  Clear any walking paths of anything that might make someone trip, such as rocks or tools.  Regularly check to see if handrails are loose or broken. Make sure that  both sides of any steps have handrails.  Any raised decks and porches should have guardrails on the edges.  Have any leaves, snow, or ice cleared regularly.  Use sand or salt on walking paths during winter.  Clean up any spills in your garage right away. This includes oil or grease spills. What can I do in the bathroom?  Use night lights.  Install grab bars by the toilet and in the tub and shower. Do not use towel bars as grab bars.  Use non-skid mats or decals in the tub or shower.  If you need to sit down in the shower, use a plastic, non-slip stool.  Keep the floor dry. Clean up any water that spills on the floor as soon as it happens.  Remove soap buildup in the tub or shower regularly.  Attach bath mats securely with double-sided non-slip rug tape.  Do not have throw rugs and other things on the floor that can make you trip. What can I do in the bedroom?  Use night lights.  Make sure that you have a light by your bed that is easy to reach.  Do not use any sheets or blankets that are too big for your bed. They should not hang down onto  the floor.  Have a firm chair that has side arms. You can use this for support while you get dressed.  Do not have throw rugs and other things on the floor that can make you trip. What can I do in the kitchen?  Clean up any spills right away.  Avoid walking on wet floors.  Keep items that you use a lot in easy-to-reach places.  If you need to reach something above you, use a strong step stool that has a grab bar.  Keep electrical cords out of the way.  Do not use floor polish or wax that makes floors slippery. If you must use wax, use non-skid floor wax.  Do not have throw rugs and other things on the floor that can make you trip. What can I do with my stairs?  Do not leave any items on the stairs.  Make sure that there are handrails on both sides of the stairs and use them. Fix handrails that are broken or loose. Make sure  that handrails are as long as the stairways.  Check any carpeting to make sure that it is firmly attached to the stairs. Fix any carpet that is loose or worn.  Avoid having throw rugs at the top or bottom of the stairs. If you do have throw rugs, attach them to the floor with carpet tape.  Make sure that you have a light switch at the top of the stairs and the bottom of the stairs. If you do not have them, ask someone to add them for you. What else can I do to help prevent falls?  Wear shoes that:  Do not have high heels.  Have rubber bottoms.  Are comfortable and fit you well.  Are closed at the toe. Do not wear sandals.  If you use a stepladder:  Make sure that it is fully opened. Do not climb a closed stepladder.  Make sure that both sides of the stepladder are locked into place.  Ask someone to hold it for you, if possible.  Clearly mark and make sure that you can see:  Any grab bars or handrails.  First and last steps.  Where the edge of each step is.  Use tools that help you move around (mobility aids) if they are needed. These include:  Canes.  Walkers.  Scooters.  Crutches.  Turn on the lights when you go into a dark area. Replace any light bulbs as soon as they burn out.  Set up your furniture so you have a clear path. Avoid moving your furniture around.  If any of your floors are uneven, fix them.  If there are any pets around you, be aware of where they are.  Review your medicines with your doctor. Some medicines can make you feel dizzy. This can increase your chance of falling. Ask your doctor what other things that you can do to help prevent falls. This information is not intended to replace advice given to you by your health care provider. Make sure you discuss any questions you have with your health care provider. Document Released: 05/09/2009 Document Revised: 12/19/2015 Document Reviewed: 08/17/2014 Elsevier Interactive Patient Education   2017 [employer].

## 2020-12-17 DIAGNOSIS — F331 Major depressive disorder, recurrent, moderate: Secondary | ICD-10-CM | POA: Diagnosis not present

## 2020-12-17 DIAGNOSIS — F419 Anxiety disorder, unspecified: Secondary | ICD-10-CM | POA: Diagnosis not present

## 2020-12-17 DIAGNOSIS — F29 Unspecified psychosis not due to a substance or known physiological condition: Secondary | ICD-10-CM | POA: Diagnosis not present

## 2021-01-13 DIAGNOSIS — F29 Unspecified psychosis not due to a substance or known physiological condition: Secondary | ICD-10-CM | POA: Diagnosis not present

## 2021-01-13 DIAGNOSIS — F331 Major depressive disorder, recurrent, moderate: Secondary | ICD-10-CM | POA: Diagnosis not present

## 2021-01-13 DIAGNOSIS — H40003 Preglaucoma, unspecified, bilateral: Secondary | ICD-10-CM | POA: Diagnosis not present

## 2021-01-13 DIAGNOSIS — F419 Anxiety disorder, unspecified: Secondary | ICD-10-CM | POA: Diagnosis not present

## 2021-01-13 DIAGNOSIS — H353131 Nonexudative age-related macular degeneration, bilateral, early dry stage: Secondary | ICD-10-CM | POA: Diagnosis not present

## 2021-01-23 IMAGING — MG DIGITAL DIAGNOSTIC BILAT W/ TOMO W/ CAD
6 of 10 series · 6 of 26 positions shown · non-contrast
Comparison: Previous exam(s).

CLINICAL DATA: Patient for follow-up of probably benign right
breast calcifications.

EXAM:
DIGITAL DIAGNOSTIC BILATERAL MAMMOGRAM WITH TOMO AND CAD

[R CC]
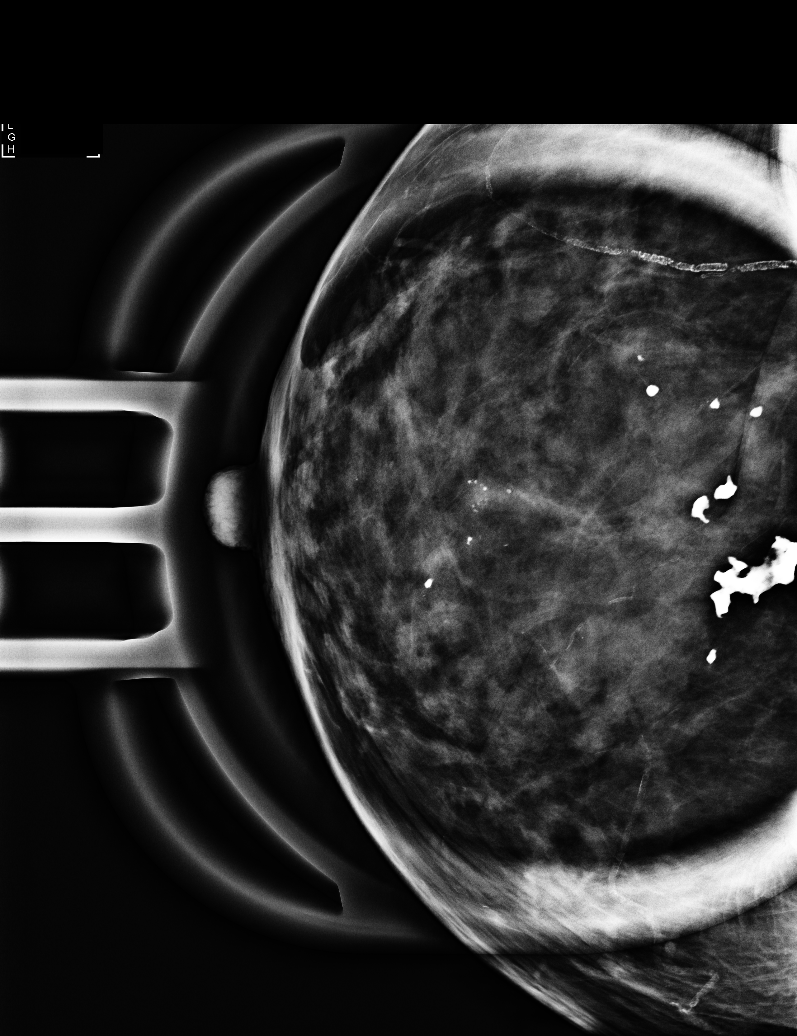

[R ML]
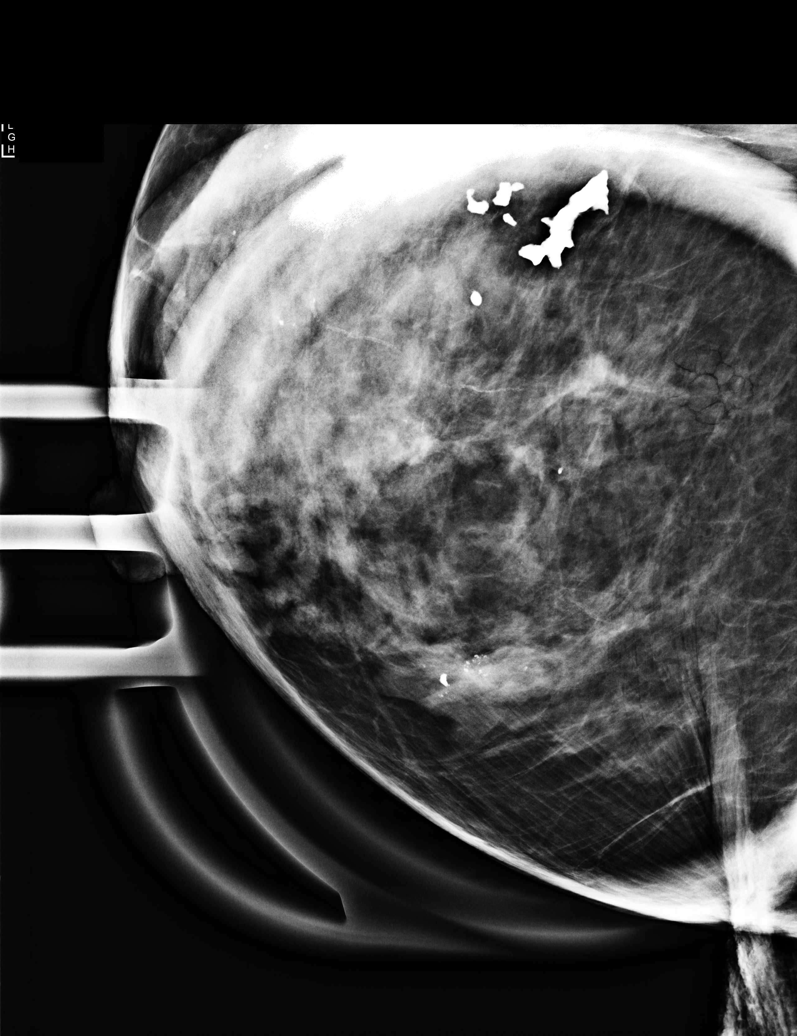

[L MLO synth-2D]
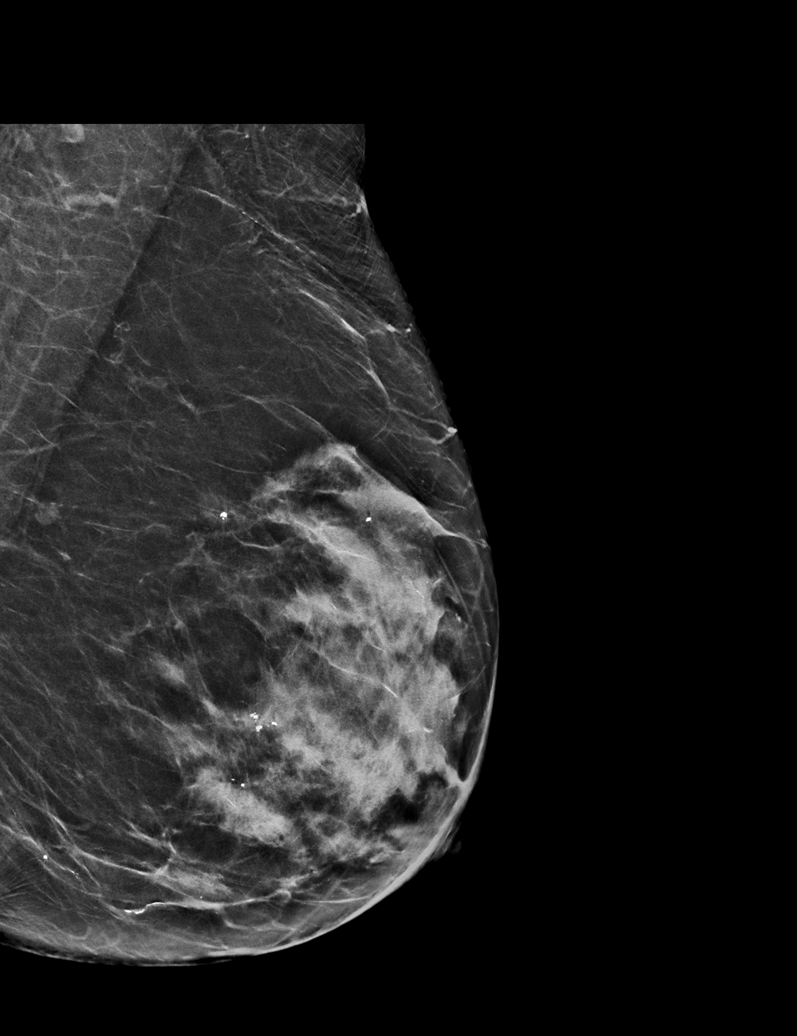

[L CC synth-2D]
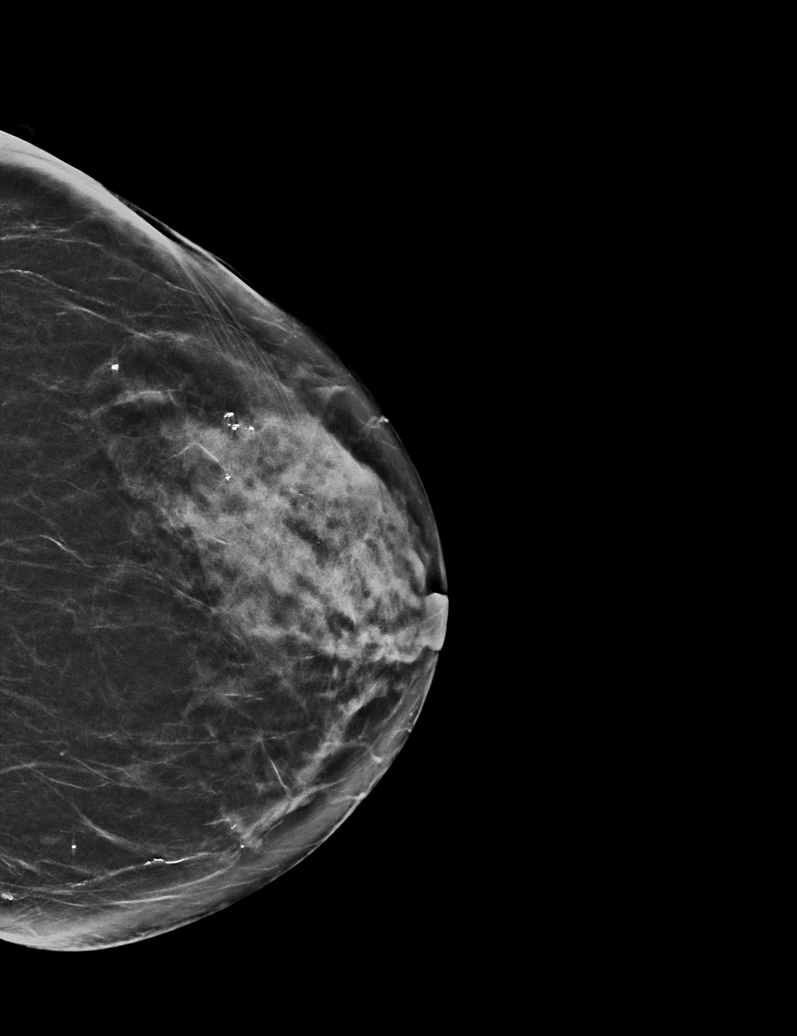

[R MLO synth-2D]
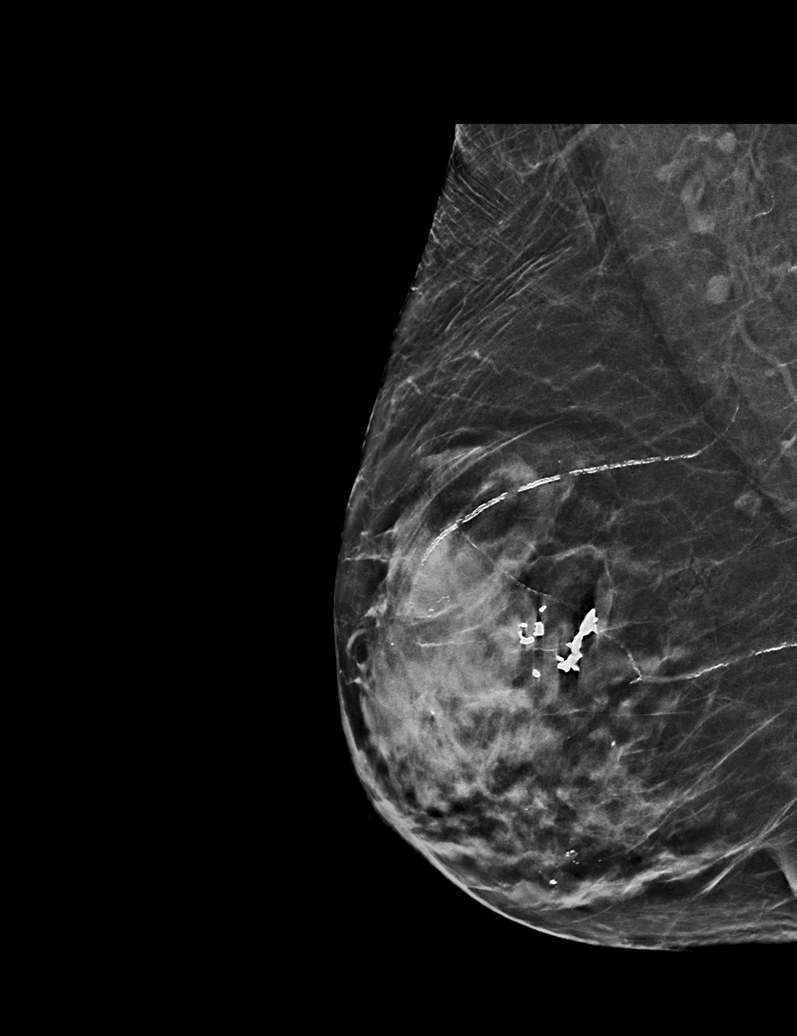

[R CC synth-2D]
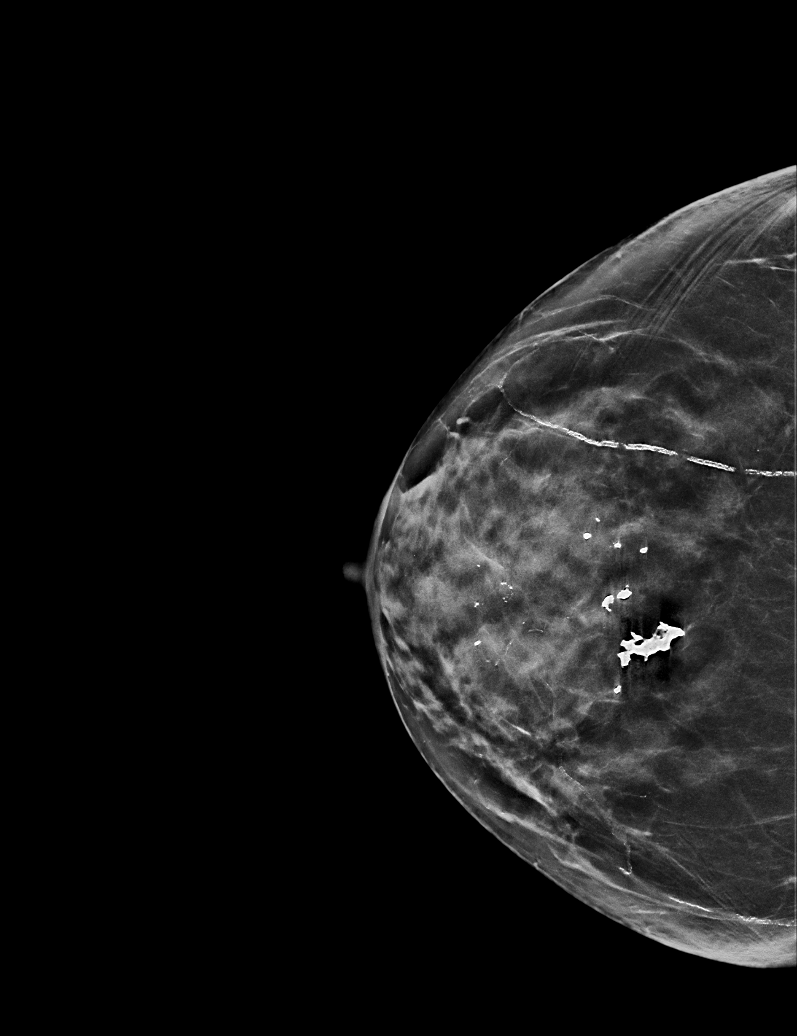

[6 of 26 positions shown; findings below may reference images not displayed]

ACR Breast Density Category c: The breast tissue is heterogeneously
dense, which may obscure small masses.
FINDINGS: Magnification cc and true lateral views of the right breast were
obtained. Grossly unchanged 5 mm group of coarse calcifications
within the inferior anterior right breast. No new masses,
calcifications or nonsurgical distortion identified within either
breast.

Mammographic images were processed with CAD.
IMPRESSION: Benign right breast calcifications given stability over time.

No mammographic evidence for malignancy.

RECOMMENDATION:
Screening mammogram in one year.(Code:[id number])

I have discussed the findings and recommendations with the patient.
If applicable, a reminder letter will be sent to the patient
regarding the next appointment.

BI-RADS CATEGORY  2: Benign.

## 2021-01-28 DIAGNOSIS — F4322 Adjustment disorder with anxiety: Secondary | ICD-10-CM | POA: Diagnosis not present

## 2021-01-28 DIAGNOSIS — F22 Delusional disorders: Secondary | ICD-10-CM | POA: Diagnosis not present

## 2021-01-30 DIAGNOSIS — I2510 Atherosclerotic heart disease of native coronary artery without angina pectoris: Secondary | ICD-10-CM | POA: Diagnosis not present

## 2021-01-30 DIAGNOSIS — I255 Ischemic cardiomyopathy: Secondary | ICD-10-CM | POA: Diagnosis not present

## 2021-01-30 DIAGNOSIS — R001 Bradycardia, unspecified: Secondary | ICD-10-CM | POA: Diagnosis not present

## 2021-01-30 DIAGNOSIS — I10 Essential (primary) hypertension: Secondary | ICD-10-CM | POA: Diagnosis not present

## 2021-02-26 ENCOUNTER — Ambulatory Visit: Payer: PPO

## 2021-03-13 ENCOUNTER — Other Ambulatory Visit: Payer: Self-pay

## 2021-03-13 MED ORDER — FUROSEMIDE 20 MG PO TABS: ORAL_TABLET | ORAL | 0 refills | Status: DC

## 2021-04-10 ENCOUNTER — Other Ambulatory Visit: Payer: Self-pay

## 2021-04-10 DIAGNOSIS — B009 Herpesviral infection, unspecified: Secondary | ICD-10-CM

## 2021-04-10 MED ORDER — ACYCLOVIR 400 MG PO TABS: 400.0000 mg | ORAL_TABLET | Freq: Every day | ORAL | 0 refills | Status: DC

## 2021-04-15 ENCOUNTER — Ambulatory Visit: Payer: PPO | Admitting: Internal Medicine

## 2021-05-01 DIAGNOSIS — F4322 Adjustment disorder with anxiety: Secondary | ICD-10-CM | POA: Diagnosis not present

## 2021-05-01 DIAGNOSIS — F22 Delusional disorders: Secondary | ICD-10-CM | POA: Diagnosis not present

## 2021-05-02 DIAGNOSIS — F22 Delusional disorders: Secondary | ICD-10-CM | POA: Diagnosis not present

## 2021-05-22 ENCOUNTER — Other Ambulatory Visit: Payer: Self-pay | Admitting: Internal Medicine

## 2021-05-22 DIAGNOSIS — Z1231 Encounter for screening mammogram for malignant neoplasm of breast: Secondary | ICD-10-CM

## 2021-06-09 ENCOUNTER — Telehealth: Payer: Self-pay | Admitting: Internal Medicine

## 2021-06-09 NOTE — Telephone Encounter (Signed)
Patient would like to have someone go over her medication. She is not sure if she should be taking all the medication she is taking.

## 2021-06-12 ENCOUNTER — Ambulatory Visit
Admission: RE | Admit: 2021-06-12 | Discharge: 2021-06-12 | Disposition: A | Discharge status: Home / Self Care | Payer: PPO | Source: Ambulatory Visit | Attending: Internal Medicine | Admitting: Internal Medicine

## 2021-06-12 ENCOUNTER — Other Ambulatory Visit: Payer: Self-pay

## 2021-06-12 DIAGNOSIS — Z1231 Encounter for screening mammogram for malignant neoplasm of breast: Secondary | ICD-10-CM | POA: Diagnosis not present

## 2021-06-12 NOTE — Telephone Encounter (Signed)
Attempted to call answer no voicemail.     

## 2021-06-17 ENCOUNTER — Other Ambulatory Visit: Payer: Self-pay

## 2021-06-17 ENCOUNTER — Ambulatory Visit: Payer: PPO

## 2021-06-17 NOTE — Telephone Encounter (Signed)
Patient scheduled fo rNV to go over medications.

## 2021-06-23 ENCOUNTER — Other Ambulatory Visit: Payer: Self-pay | Admitting: Internal Medicine

## 2021-06-23 NOTE — Telephone Encounter (Signed)
Refused,  she has not been seen in over 6 months and last refill was sept 1

## 2021-06-23 NOTE — Telephone Encounter (Signed)
Refilled: 11/12/2020 Last OV: 10/08/2020 Next OV: not scheduled

## 2021-06-26 ENCOUNTER — Other Ambulatory Visit: Payer: Self-pay

## 2021-06-26 ENCOUNTER — Encounter: Payer: Self-pay | Admitting: Internal Medicine

## 2021-06-26 ENCOUNTER — Ambulatory Visit (INDEPENDENT_AMBULATORY_CARE_PROVIDER_SITE_OTHER): Payer: PPO | Admitting: Internal Medicine

## 2021-06-26 VITALS — BP 140/62 | HR 73 | Temp 96.0°F | Ht 63.0 in | Wt 121.4 lb

## 2021-06-26 DIAGNOSIS — D631 Anemia in chronic kidney disease: Secondary | ICD-10-CM

## 2021-06-26 DIAGNOSIS — F411 Generalized anxiety disorder: Secondary | ICD-10-CM

## 2021-06-26 DIAGNOSIS — I10 Essential (primary) hypertension: Secondary | ICD-10-CM

## 2021-06-26 DIAGNOSIS — R441 Visual hallucinations: Secondary | ICD-10-CM | POA: Diagnosis not present

## 2021-06-26 DIAGNOSIS — I700 Atherosclerosis of aorta: Secondary | ICD-10-CM | POA: Diagnosis not present

## 2021-06-26 DIAGNOSIS — N1831 Chronic kidney disease, stage 3a: Secondary | ICD-10-CM

## 2021-06-26 LAB — CBC WITH DIFFERENTIAL/PLATELET
Basophils Absolute: 0.1 10*3/uL (ref 0.0–0.1)
Basophils Relative: 0.8 % (ref 0.0–3.0)
Eosinophils Absolute: 0.3 10*3/uL (ref 0.0–0.7)
Eosinophils Relative: 4 % (ref 0.0–5.0)
HCT: 36.4 % (ref 36.0–46.0)
Hemoglobin: 11.9 g/dL — ABNORMAL LOW (ref 12.0–15.0)
Lymphocytes Relative: 27.6 % (ref 12.0–46.0)
Lymphs Abs: 2.1 10*3/uL (ref 0.7–4.0)
MCHC: 32.6 g/dL (ref 30.0–36.0)
MCV: 94.9 fl (ref 78.0–100.0)
Monocytes Absolute: 0.6 10*3/uL (ref 0.1–1.0)
Monocytes Relative: 7.5 % (ref 3.0–12.0)
Neutro Abs: 4.6 10*3/uL (ref 1.4–7.7)
Neutrophils Relative %: 60.1 % (ref 43.0–77.0)
Platelets: 172 10*3/uL (ref 150.0–400.0)
RBC: 3.83 Mil/uL — ABNORMAL LOW (ref 3.87–5.11)
RDW: 13.4 % (ref 11.5–15.5)
WBC: 7.6 10*3/uL (ref 4.0–10.5)

## 2021-06-26 LAB — COMPREHENSIVE METABOLIC PANEL
ALT: 25 U/L (ref 0–35)
AST: 19 U/L (ref 0–37)
Albumin: 3.8 g/dL (ref 3.5–5.2)
Alkaline Phosphatase: 54 U/L (ref 39–117)
BUN: 32 mg/dL — ABNORMAL HIGH (ref 6–23)
CO2: 25 mEq/L (ref 19–32)
Calcium: 9.6 mg/dL (ref 8.4–10.5)
Chloride: 108 mEq/L (ref 96–112)
Creatinine, Ser: 1.13 mg/dL (ref 0.40–1.20)
GFR: 44.83 mL/min — ABNORMAL LOW (ref >60.00)
Glucose, Bld: 100 mg/dL — ABNORMAL HIGH (ref 70–99)
Potassium: 4.1 mEq/L (ref 3.5–5.1)
Sodium: 138 mEq/L (ref 135–145)
Total Bilirubin: 0.4 mg/dL (ref 0.2–1.2)
Total Protein: 6.2 g/dL (ref 6.0–8.3)

## 2021-06-26 MED ORDER — ALPRAZOLAM 0.5 MG PO TABS: 0.2500 mg | ORAL_TABLET | Freq: Every evening | ORAL | 5 refills | Status: DC | PRN

## 2021-06-26 NOTE — Progress Notes (Signed)
Subjective:  Patient ID: Audrey Duran, female    DOB: 09/21/1934  Age: 86 y.o. MRN: [id number]  CC: The primary encounter diagnosis was Stage 3a chronic kidney disease ([place]). Diagnoses of Anemia due to stage 3a chronic kidney disease ([place]), Anxiety state, Episodes of formed visual hallucinations, Aortic atherosclerosis ([place]), and Essential hypertension were also pertinent to this visit.  HPI Audrey Duran presents for follow up on insomnia, hyperlipidemia and hypertension   Chief Complaint  Patient presents with   Follow-up    Discuss medications    Has not refilled alprazolam In months but uses it to prevent early wakeups . Still grieving the loss of her son [name] who died March 31, 2020.    Seeing Dr Nicolasa Ducking? No,  last visit was in  July . Doesn't like her.  Didn't like the medication she was prescribing for delusions.  Sees Miguel Dibble for counselling.  No longer staying with daughter [name],  back in her own mobile home  on [address] in [place].  [facility]  but "the cult" is still there "changing things" however they are not bothering her   CKD with anemia:   HTN: Patient is taking her telmisartan as prescribed and notes no adverse effects.  Home BP readings have been done about once per week and are  generally < 140/80 .  She is avoiding added salt in her diet and  going to the [facility] regularly about 3 times per week for exercise  .   Outpatient Medications Prior to Visit  Medication Sig Dispense Refill   acyclovir (ZOVIRAX) 400 MG tablet Take 1 tablet (400 mg total) by mouth daily. 90 tablet 0   alendronate (FOSAMAX) 70 MG tablet Take 1 tablet (70 mg total) by mouth every 7 (seven) days. Take with a full glass of water on an empty stomach. 12 tablet 3   aspirin 81 MG tablet Take 81 mg by mouth at bedtime.     carbonyl iron (FEOSOL) 45 MG TABS tablet Take one by mouth daily     Cholecalciferol (VITAMIN D3) 3000 UNITS TABS Take 1 tablet by mouth daily. Take  one by mouth daily 90 tablet 3   citalopram (CELEXA) 10 MG tablet Take by mouth every morning.     cyanocobalamin 1000 MCG tablet Take one by mouth daily     furosemide (LASIX) 20 MG tablet Take 1/2 tablet by mouth (10mg ) daily 45 tablet 0   magnesium oxide (MAG-OX) 400 MG tablet Take 400 mg by mouth daily.     meclizine (ANTIVERT) 50 MG tablet Take 50 mg by mouth 3 (three) times daily as needed.     Omega-3 Fatty Acids (FISH OIL) 1200 MG CAPS Take 1 capsule by mouth daily. Take one by mouth daily     risperiDONE (RISPERDAL) 0.5 MG tablet Take 0.5 mg by mouth at bedtime.     rosuvastatin (CRESTOR) 20 MG tablet Take 1 tablet (20 mg total) by mouth daily. 90 tablet 3   telmisartan (MICARDIS) 20 MG tablet Take 1 tablet (20 mg total) by mouth daily. 90 tablet 3   timolol (TIMOPTIC) 0.5 % ophthalmic solution Place 1 drop into both eyes 2 (two) times daily.      ALPRAZolam (XANAX) 0.5 MG tablet TAKE 1 TABLET BY MOUTH AT BEDTIME AS NEEDED 30 tablet 5   No facility-administered medications prior to visit.    Review of Systems;  Patient denies headache, fevers, malaise, unintentional weight loss, skin  rash, eye pain, sinus congestion and sinus pain, sore throat, dysphagia,  hemoptysis , cough, dyspnea, wheezing, chest pain, palpitations, orthopnea, edema, abdominal pain, nausea, melena, diarrhea, constipation, flank pain, dysuria, hematuria, urinary  Frequency, nocturia, numbness, tingling, seizures,  Focal weakness, Loss of consciousness,  Tremor, insomnia, depression, anxiety, and suicidal ideation.      Objective:  BP 140/62 (BP Location: Left Arm, Patient Position: Sitting, Cuff Size: Normal)   Pulse 73   Temp (!) 96 F (35.6 C) (Temporal)   Ht 5\' 3"  (1.6 m)   Wt 121 lb 6.4 oz (55.1 kg)   SpO2 99%   BMI 21.51 kg/m   BP Readings from Last 3 Encounters:  06/26/21 140/62  10/08/20 118/64  05/02/20 (!) 148/64    Wt Readings from Last 3 Encounters:  06/26/21 121 lb 6.4 oz (55.1 kg)   12/03/20 123 lb (55.8 kg)  10/08/20 123 lb (55.8 kg)    General appearance: alert, cooperative and appears stated age Ears: normal TM's and external ear canals both ears Throat: lips, mucosa, and tongue normal; teeth and gums normal Neck: no adenopathy, no carotid bruit, supple, symmetrical, trachea midline and thyroid not enlarged, symmetric, no tenderness/mass/nodules Back: symmetric, no curvature. ROM normal. No CVA tenderness. Lungs: clear to auscultation bilaterally Heart: regular rate and rhythm, S1, S2 normal, no murmur, click, rub or gallop Abdomen: soft, non-tender; bowel sounds normal; no masses,  no organomegaly Pulses: 2+ and symmetric Skin: Skin color, texture, turgor normal. No rashes or lesions Lymph nodes: Cervical, supraclavicular, and axillary nodes normal.  Lab Results  Component Value Date   HGBA1C 5.8 08/21/2019   HGBA1C 5.9 04/13/2019   HGBA1C 5.8 10/06/2018    Lab Results  Component Value Date   CREATININE 1.13 10/08/2020   CREATININE 0.95 (H) 03/01/2020   CREATININE 0.79 02/27/2020    Lab Results  Component Value Date   WBC 9.8 10/08/2020   HGB 12.4 10/08/2020   HCT 36.9 10/08/2020   PLT 200.0 10/08/2020   GLUCOSE 106 (H) 10/08/2020   CHOL 143 10/08/2020   TRIG 112.0 10/08/2020   HDL 51.60 10/08/2020   LDLCALC 69 10/08/2020   ALT 15 10/08/2020   AST 15 10/08/2020   NA 136 10/08/2020   K 4.2 10/08/2020   CL 101 10/08/2020   CREATININE 1.13 10/08/2020   BUN 27 (H) 10/08/2020   CO2 28 10/08/2020   TSH 1.79 08/21/2019   INR 1.0 02/16/2012   HGBA1C 5.8 08/21/2019    MM 3D SCREEN BREAST BILATERAL  Result Date: 06/12/2021 CLINICAL DATA:  Screening. EXAM: DIGITAL SCREENING BILATERAL MAMMOGRAM WITH TOMOSYNTHESIS AND CAD TECHNIQUE: Bilateral screening digital craniocaudal and mediolateral oblique mammograms were obtained. Bilateral screening digital breast tomosynthesis was performed. The images were evaluated with computer-aided detection.  COMPARISON:  Previous exam(s). ACR Breast Density Category c: The breast tissue is heterogeneously dense, which may obscure small masses. FINDINGS: There are no findings suspicious for malignancy. IMPRESSION: No mammographic evidence of malignancy. A result letter of this screening mammogram will be mailed directly to the patient. RECOMMENDATION: Screening mammogram in one year. (Code:SM-B-01Y) BI-RADS CATEGORY  1: Negative. Electronically Signed   By: Ammie Ferrier M.D.   On: 06/12/2021 14:04   Assessment & Plan:   Problem List Items Addressed This Visit     Anemia   Relevant Orders   CBC with Differential/Platelet   Anxiety state    Advised to continue daiyl use of lexapro and minimize use of alprazolam  To note more than  0.25 mg daily .  She refuses to follow up with psychiatry       Relevant Medications   ALPRAZolam (XANAX) 0.5 MG tablet   Aortic atherosclerosis (HCC)    Reviewed findings of prior CT scan today..  Patient is tolerating high potency statin therapy       CKD (chronic kidney disease) stage 3, GFR 30-59 ml/min (HCC) - Primary    With improvement in  GFR following reduction in use of diuretics to furosemide 10 mg daily . checking lytes and PTH today   Lab Results  Component Value Date   CREATININE 1.13 10/08/2020   CREATININE 0.95 (H) 03/01/2020   CREATININE 0.79 02/27/2020    Lab Results  Component Value Date   CREATININE 1.13 10/08/2020         Relevant Orders   Comprehensive metabolic panel   PTH, Intact and Calcium   Episodes of formed visual hallucinations    She was treated for delusional disorder by Dr. Nicolasa Ducking with risperdal after failing trial of seroquel but refuses to take either due to excessive sedation .  She continues to report that a cult [place], but she is no longer fearful       Essential hypertension    Well controlled on current regimen. Renal function due , no changes today.       I provided  30 minutes   during this encounter reviewing patient's current problems and past surgeries, labs and imaging studies, providing counseling on the above mentioned problems I n a face to face visit  , and coordination  of care .   Meds ordered this encounter  Medications   ALPRAZolam (XANAX) 0.5 MG tablet    Sig: Take 0.5 tablets (0.25 mg total) by mouth at bedtime as needed for anxiety or sleep.    Dispense:  15 tablet    Refill:  5    NOTE DOSE REDUCTION    Medications Discontinued During This Encounter  Medication Reason   ALPRAZolam (XANAX) 0.5 MG tablet Reorder    Follow-up: No follow-ups on file.   Crecencio Mc, MD

## 2021-06-26 NOTE — Assessment & Plan Note (Signed)
She was treated for delusional disorder by Dr. Nicolasa Ducking with risperdal after failing trial of seroquel but refuses to take either due to excessive sedation .  She continues to report that a cult is [place], but she is no longer fearful

## 2021-06-26 NOTE — Patient Instructions (Addendum)
Please continue daily escitalopram.  This is an antidepressant and it is very safe.  I want you to start taking melatonin 3 mg daily at dinner time to help your insomnia   I do not want you to use more than 1/2 alprazolam per night  because it is addicting.   You should be taking telmisartan for  blood pressure and rosuvastatin for cholesterol

## 2021-06-26 NOTE — Assessment & Plan Note (Signed)
Advised to continue daiyl use of lexapro and minimize use of alprazolam  To note more than 0.25 mg daily .  She refuses to follow up with psychiatry

## 2021-06-26 NOTE — Assessment & Plan Note (Signed)
Well controlled on current regimen. Renal function due.,   no changes today. 

## 2021-06-26 NOTE — Assessment & Plan Note (Signed)
Reviewed findings of prior CT scan today..  Patient is tolerating high potency statin therapy  

## 2021-06-26 NOTE — Assessment & Plan Note (Addendum)
With improvement in  GFR following reduction in use of diuretics to furosemide 10 mg daily . checking lytes and PTH today   Lab Results  Component Value Date   CREATININE 1.13 10/08/2020   CREATININE 0.95 (H) 03/01/2020   CREATININE 0.79 02/27/2020    Lab Results  Component Value Date   CREATININE 1.13 10/08/2020

## 2021-06-27 LAB — PTH, INTACT AND CALCIUM
Calcium: 9.4 mg/dL (ref 8.6–10.4)
PTH: 73 pg/mL (ref 16–77)

## 2021-07-10 ENCOUNTER — Other Ambulatory Visit: Payer: Self-pay

## 2021-07-10 MED ORDER — FUROSEMIDE 20 MG PO TABS: ORAL_TABLET | ORAL | 0 refills | Status: DC

## 2021-07-10 MED ORDER — ROSUVASTATIN CALCIUM 20 MG PO TABS: 20.0000 mg | ORAL_TABLET | Freq: Every day | ORAL | 3 refills | Status: DC

## 2021-07-25 DIAGNOSIS — I110 Hypertensive heart disease with heart failure: Secondary | ICD-10-CM | POA: Diagnosis not present

## 2021-07-25 DIAGNOSIS — Z6821 Body mass index (BMI) 21.0-21.9, adult: Secondary | ICD-10-CM | POA: Diagnosis not present

## 2021-07-25 DIAGNOSIS — Z87891 Personal history of nicotine dependence: Secondary | ICD-10-CM | POA: Diagnosis not present

## 2021-07-25 DIAGNOSIS — I509 Heart failure, unspecified: Secondary | ICD-10-CM | POA: Diagnosis not present

## 2021-08-04 DIAGNOSIS — H40003 Preglaucoma, unspecified, bilateral: Secondary | ICD-10-CM | POA: Diagnosis not present

## 2021-08-05 DIAGNOSIS — I255 Ischemic cardiomyopathy: Secondary | ICD-10-CM | POA: Diagnosis not present

## 2021-08-05 DIAGNOSIS — E785 Hyperlipidemia, unspecified: Secondary | ICD-10-CM | POA: Diagnosis not present

## 2021-08-05 DIAGNOSIS — I10 Essential (primary) hypertension: Secondary | ICD-10-CM | POA: Diagnosis not present

## 2021-08-05 DIAGNOSIS — I2510 Atherosclerotic heart disease of native coronary artery without angina pectoris: Secondary | ICD-10-CM | POA: Diagnosis not present

## 2021-08-14 ENCOUNTER — Telehealth: Payer: Self-pay | Admitting: Internal Medicine

## 2021-08-14 DIAGNOSIS — Z6821 Body mass index (BMI) 21.0-21.9, adult: Secondary | ICD-10-CM | POA: Diagnosis not present

## 2021-08-14 DIAGNOSIS — F29 Unspecified psychosis not due to a substance or known physiological condition: Secondary | ICD-10-CM | POA: Diagnosis not present

## 2021-08-14 DIAGNOSIS — I130 Hypertensive heart and chronic kidney disease with heart failure and stage 1 through stage 4 chronic kidney disease, or unspecified chronic kidney disease: Secondary | ICD-10-CM | POA: Diagnosis not present

## 2021-08-14 DIAGNOSIS — G4700 Insomnia, unspecified: Secondary | ICD-10-CM | POA: Diagnosis not present

## 2021-08-14 DIAGNOSIS — Z7982 Long term (current) use of aspirin: Secondary | ICD-10-CM | POA: Diagnosis not present

## 2021-08-14 DIAGNOSIS — N1832 Chronic kidney disease, stage 3b: Secondary | ICD-10-CM | POA: Diagnosis not present

## 2021-08-14 DIAGNOSIS — I700 Atherosclerosis of aorta: Secondary | ICD-10-CM | POA: Diagnosis not present

## 2021-08-14 DIAGNOSIS — I509 Heart failure, unspecified: Secondary | ICD-10-CM | POA: Diagnosis not present

## 2021-08-14 DIAGNOSIS — J449 Chronic obstructive pulmonary disease, unspecified: Secondary | ICD-10-CM | POA: Diagnosis not present

## 2021-08-14 NOTE — Telephone Encounter (Signed)
[facility] called to let Dr Derrel Nip know that patient will be seeing them for routine care.

## 2021-08-18 ENCOUNTER — Telehealth: Payer: Self-pay | Admitting: Internal Medicine

## 2021-08-18 NOTE — Telephone Encounter (Signed)
Recv'd records from [place] of [facility] forwarded 5 pages to Dr. Deborra Medina 1/23/23fbg

## 2021-09-09 ENCOUNTER — Other Ambulatory Visit: Payer: Self-pay | Admitting: Internal Medicine

## 2021-09-09 DIAGNOSIS — B009 Herpesviral infection, unspecified: Secondary | ICD-10-CM

## 2021-10-23 DIAGNOSIS — J449 Chronic obstructive pulmonary disease, unspecified: Secondary | ICD-10-CM | POA: Diagnosis not present

## 2021-10-23 DIAGNOSIS — I5020 Unspecified systolic (congestive) heart failure: Secondary | ICD-10-CM | POA: Diagnosis not present

## 2021-10-23 DIAGNOSIS — G4700 Insomnia, unspecified: Secondary | ICD-10-CM | POA: Diagnosis not present

## 2021-10-23 DIAGNOSIS — Z6821 Body mass index (BMI) 21.0-21.9, adult: Secondary | ICD-10-CM | POA: Diagnosis not present

## 2021-10-23 DIAGNOSIS — F29 Unspecified psychosis not due to a substance or known physiological condition: Secondary | ICD-10-CM | POA: Diagnosis not present

## 2021-10-23 DIAGNOSIS — I110 Hypertensive heart disease with heart failure: Secondary | ICD-10-CM | POA: Diagnosis not present

## 2021-11-18 ENCOUNTER — Telehealth: Payer: Self-pay | Admitting: Internal Medicine

## 2021-11-18 DIAGNOSIS — R7301 Impaired fasting glucose: Secondary | ICD-10-CM

## 2021-11-18 DIAGNOSIS — D649 Anemia, unspecified: Secondary | ICD-10-CM

## 2021-11-18 DIAGNOSIS — Z79899 Other long term (current) drug therapy: Secondary | ICD-10-CM

## 2021-11-18 DIAGNOSIS — E785 Hyperlipidemia, unspecified: Secondary | ICD-10-CM

## 2021-11-18 DIAGNOSIS — I10 Essential (primary) hypertension: Secondary | ICD-10-CM

## 2021-11-18 NOTE — Telephone Encounter (Signed)
Spoke with pt and scheduled her for both a fasting lab appt and an office visit with Dr. Derrel Nip. Pt is aware of appt date and time. Labs have been ordered.  ?

## 2021-11-18 NOTE — Telephone Encounter (Signed)
Pt want to know if she is due for lab work and she also want to know if she has arthritis in her back ?

## 2021-11-24 ENCOUNTER — Other Ambulatory Visit (INDEPENDENT_AMBULATORY_CARE_PROVIDER_SITE_OTHER): Payer: PPO

## 2021-11-24 DIAGNOSIS — Z79899 Other long term (current) drug therapy: Secondary | ICD-10-CM

## 2021-11-24 DIAGNOSIS — D649 Anemia, unspecified: Secondary | ICD-10-CM

## 2021-11-24 DIAGNOSIS — R7301 Impaired fasting glucose: Secondary | ICD-10-CM | POA: Diagnosis not present

## 2021-11-24 DIAGNOSIS — E785 Hyperlipidemia, unspecified: Secondary | ICD-10-CM

## 2021-11-24 DIAGNOSIS — I10 Essential (primary) hypertension: Secondary | ICD-10-CM

## 2021-11-24 LAB — COMPREHENSIVE METABOLIC PANEL
ALT: 19 U/L (ref 0–35)
AST: 19 U/L (ref 0–37)
Albumin: 3.9 g/dL (ref 3.5–5.2)
Alkaline Phosphatase: 60 U/L (ref 39–117)
BUN: 39 mg/dL — ABNORMAL HIGH (ref 6–23)
CO2: 23 mEq/L (ref 19–32)
Calcium: 9.5 mg/dL (ref 8.4–10.5)
Chloride: 108 mEq/L (ref 96–112)
Creatinine, Ser: 1.43 mg/dL — ABNORMAL HIGH (ref 0.40–1.20)
GFR: 33.7 mL/min — ABNORMAL LOW (ref >60.00)
Glucose, Bld: 96 mg/dL (ref 70–99)
Potassium: 4.7 mEq/L (ref 3.5–5.1)
Sodium: 139 mEq/L (ref 135–145)
Total Bilirubin: 0.4 mg/dL (ref 0.2–1.2)
Total Protein: 6.4 g/dL (ref 6.0–8.3)

## 2021-11-24 LAB — CBC WITH DIFFERENTIAL/PLATELET
Basophils Absolute: 0.1 10*3/uL (ref 0.0–0.1)
Basophils Relative: 1 % (ref 0.0–3.0)
Eosinophils Absolute: 0.3 10*3/uL (ref 0.0–0.7)
Eosinophils Relative: 4.1 % (ref 0.0–5.0)
HCT: 35.9 % — ABNORMAL LOW (ref 36.0–46.0)
Hemoglobin: 11.9 g/dL — ABNORMAL LOW (ref 12.0–15.0)
Lymphocytes Relative: 26.9 % (ref 12.0–46.0)
Lymphs Abs: 2 10*3/uL (ref 0.7–4.0)
MCHC: 33.1 g/dL (ref 30.0–36.0)
MCV: 93.5 fl (ref 78.0–100.0)
Monocytes Absolute: 0.5 10*3/uL (ref 0.1–1.0)
Monocytes Relative: 7 % (ref 3.0–12.0)
Neutro Abs: 4.5 10*3/uL (ref 1.4–7.7)
Neutrophils Relative %: 61 % (ref 43.0–77.0)
Platelets: 166 10*3/uL (ref 150.0–400.0)
RBC: 3.84 Mil/uL — ABNORMAL LOW (ref 3.87–5.11)
RDW: 13.5 % (ref 11.5–15.5)
WBC: 7.3 10*3/uL (ref 4.0–10.5)

## 2021-11-24 LAB — LIPID PANEL
Cholesterol: 121 mg/dL (ref 0–200)
HDL: 53.1 mg/dL (ref >39.00)
LDL Cholesterol: 58 mg/dL (ref 0–99)
NonHDL: 67.44
Total CHOL/HDL Ratio: 2
Triglycerides: 48 mg/dL (ref 0.0–149.0)
VLDL: 9.6 mg/dL (ref 0.0–40.0)

## 2021-11-24 LAB — MICROALBUMIN / CREATININE URINE RATIO
Creatinine,U: 46.8 mg/dL
Microalb Creat Ratio: 12.3 mg/g (ref 0.0–30.0)
Microalb, Ur: 5.8 mg/dL — ABNORMAL HIGH (ref 0.0–1.9)

## 2021-11-24 LAB — TSH: TSH: 2.67 u[IU]/mL (ref 0.35–5.50)

## 2021-11-24 LAB — HEMOGLOBIN A1C: Hgb A1c MFr Bld: 5.4 % (ref 4.6–6.5)

## 2021-12-01 ENCOUNTER — Telehealth: Payer: Self-pay

## 2021-12-01 DIAGNOSIS — B009 Herpesviral infection, unspecified: Secondary | ICD-10-CM

## 2021-12-01 NOTE — Telephone Encounter (Signed)
Pt returning call.... Advise Pt of below note.... Pt stated that she switch pharmacy to [facility]...  ?

## 2021-12-01 NOTE — Telephone Encounter (Signed)
LMTCB. Need to find out if pt is still using the [facility] mail order pharmacy or if she has switched to just [facility].  ?

## 2021-12-02 MED ORDER — ACYCLOVIR 400 MG PO TABS: 400.0000 mg | ORAL_TABLET | Freq: Every day | ORAL | 1 refills | Status: DC

## 2021-12-02 MED ORDER — TELMISARTAN 20 MG PO TABS: 20.0000 mg | ORAL_TABLET | Freq: Every day | ORAL | 3 refills | Status: DC

## 2021-12-02 NOTE — Addendum Note (Signed)
Addended by: Adair Laundry on: 12/02/2021 08:43 AM ? ? Modules accepted: Orders ? ?

## 2021-12-02 NOTE — Telephone Encounter (Signed)
Medications have been refilled to [facility].  ?

## 2021-12-03 ENCOUNTER — Encounter: Payer: Self-pay | Admitting: Internal Medicine

## 2021-12-03 ENCOUNTER — Ambulatory Visit (INDEPENDENT_AMBULATORY_CARE_PROVIDER_SITE_OTHER): Payer: PPO | Admitting: Internal Medicine

## 2021-12-03 VITALS — BP 122/60 | HR 59 | Temp 97.6°F | Ht 63.0 in | Wt 124.6 lb

## 2021-12-03 DIAGNOSIS — I10 Essential (primary) hypertension: Secondary | ICD-10-CM | POA: Diagnosis not present

## 2021-12-03 DIAGNOSIS — Z8619 Personal history of other infectious and parasitic diseases: Secondary | ICD-10-CM | POA: Diagnosis not present

## 2021-12-03 DIAGNOSIS — N1831 Chronic kidney disease, stage 3a: Secondary | ICD-10-CM

## 2021-12-03 DIAGNOSIS — F22 Delusional disorders: Secondary | ICD-10-CM

## 2021-12-03 DIAGNOSIS — I700 Atherosclerosis of aorta: Secondary | ICD-10-CM | POA: Diagnosis not present

## 2021-12-03 DIAGNOSIS — R441 Visual hallucinations: Secondary | ICD-10-CM

## 2021-12-03 NOTE — Assessment & Plan Note (Signed)
She has refused medication and has agreed to return if the sightings persist after the "man at the trailer [place]" moves out ?

## 2021-12-03 NOTE — Patient Instructions (Addendum)
If you give me Hope's phone number I will try to contact her about the things you have seen  ? ?If you continue to see strange activity after the man moves out, please return to see me  ? ? ?You should NOT be taking ibuprofen .  This can harm your kidneys  ? ?You can add up to 2000 mg of acetominophen (tylenol) every day safely  In divided doses (500 mg every 6 hours  Or 1000 mg every 12 hours.)  ? ? ?

## 2021-12-03 NOTE — Assessment & Plan Note (Addendum)
Recent drop noted ; reminded patient to avoid NSAIDs and increase hydration.  Repeating today.  Continue telmisartan and refer to nephrology if no improvement  ?

## 2021-12-03 NOTE — Assessment & Plan Note (Signed)
She remains adamant about what she has witnessed.. she refuses medications.  She is not a danger to herself or others.  ?

## 2021-12-03 NOTE — Progress Notes (Signed)
? ?Subjective:  ?Patient ID: Audrey Duran, female    DOB: [birth year]  Age: 87 y.o. MRN: [id number] ? ?CC: The primary encounter diagnosis was Aortic atherosclerosis ([place]). 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 ? ? ?This visit occurred during the SARS-CoV-2 public health emergency.  Safety protocols were in place, including screening questions prior to the visit, additional usage of staff PPE, and extensive cleaning of exam room while observing appropriate contact time as indicated for disinfecting solutions.   ? ?HPI ?Audrey Duran presents for follow up on hypertension, delusional disorder with psychosis,  COPD,  ad CAD  ?Chief Complaint  ?Patient presents with  ? Follow-up  ?  6 month follow up   ? ?1) HTN;  Patient is taking telmisartan 20 mg  daily  and notes no adverse effects.  Home BP readings have been done about once per week and are  generally < 130/80 .  She is avoiding added salt in her diet and attending an exercise class 3 to  4 days per week for exercise  .  ? ?2) Delusional disorder with paranoid psychosis:  no longer  taking risperidone and citalopram .  Per Dr Waylan Boga December 2022 note,  She was taking lexapro and Saphris 2.5 mg qhs ; but she is no longer seeing her.   She is persistent in her delusion about strange activity at the mobile home park but states that [facility],  the owner,  might be willing to admit to me that strange things have been happening.  She states that "the man doing it" is moving today.  ? ?3) cc: right posterior hip pain that resolves with exercise.  Continues to participate in exercise class.   Taking tylenol and ibuprofen ? ?4) CKD:  GFR dropped to 30 during last check but she was NPO.  Has also been taking ibuprofen  ? ? ?Outpatient Medications Prior to Visit  ?Medication Sig Dispense Refill  ?  acyclovir (ZOVIRAX) 400 MG tablet Take 1 tablet (400 mg total) by mouth daily. 90 tablet 1  ? alendronate (FOSAMAX) 70 MG tablet Take 1 tablet by mouth every 7 days on an empty stomach with a full glass of water 12 tablet 2  ? ALPRAZolam (XANAX) 0.5 MG tablet Take 0.5 tablets (0.25 mg total) by mouth at bedtime as needed for anxiety or sleep. 15 tablet 5  ? aspirin 81 MG tablet Take 81 mg by mouth at bedtime.    ? carbonyl iron (FEOSOL) 45 MG TABS tablet Take one by mouth daily    ? Cholecalciferol (VITAMIN D3) 3000 UNITS TABS Take 1 tablet by mouth daily. Take one by mouth daily 90 tablet 3  ? citalopram (CELEXA) 10 MG tablet Take by mouth every morning.    ? cyanocobalamin 1000 MCG tablet Take one by mouth daily    ? furosemide (LASIX) 20 MG tablet Take 1/2 tablet by mouth ('10mg'$ ) daily 45 tablet 0  ? magnesium oxide (MAG-OX) 400 MG tablet Take 400 mg by mouth daily.    ? meclizine (ANTIVERT) 50 MG tablet Take 50 mg by mouth 3 (three) times daily as needed.    ? Omega-3 Fatty Acids (FISH OIL) 1200 MG CAPS Take 1 capsule by mouth daily. Take one by mouth daily    ? risperiDONE (RISPERDAL) 0.5 MG tablet Take 0.5 mg by mouth at bedtime.    ?  rosuvastatin (CRESTOR) 20 MG tablet Take 1 tablet (20 mg total) by mouth daily. 90 tablet 3  ? telmisartan (MICARDIS) 20 MG tablet Take 1 tablet (20 mg total) by mouth daily. 90 tablet 3  ? timolol (TIMOPTIC) 0.5 % ophthalmic solution Place 1 drop into both eyes 2 (two) times daily.     ? ?No facility-administered medications prior to visit.  ? ? ?Review of Systems; ? ?Patient denies headache, fevers, malaise, unintentional weight loss, skin rash, eye pain, sinus congestion and sinus pain, sore throat, dysphagia,  hemoptysis , cough, dyspnea, wheezing, chest pain, palpitations, orthopnea, edema, abdominal pain, nausea, melena, diarrhea, constipation, flank pain, dysuria, hematuria, urinary  Frequency, nocturia, numbness, tingling, seizures,  Focal weakness, Loss of consciousness,   Tremor, insomnia, depression, anxiety, and suicidal ideation.   ? ? ? ?Objective:  ?BP 122/60 (BP Location: Left Arm, Patient Position: Sitting, Cuff Size: Normal)   Pulse (!) 59   Temp 97.6 ?F (36.4 ?C) (Oral)   Ht '5\' 3"'$  (1.6 m)   Wt 124 lb 9.6 oz (56.5 kg)   SpO2 96%   BMI 22.07 kg/m?  ? ?BP Readings from Last 3 Encounters:  ?12/03/21 122/60  ?06/26/21 140/62  ?10/08/20 118/64  ? ? ?Wt Readings from Last 3 Encounters:  ?12/03/21 124 lb 9.6 oz (56.5 kg)  ?06/26/21 121 lb 6.4 oz (55.1 kg)  ?12/03/20 123 lb (55.8 kg)  ? ? ?General appearance: alert, cooperative and appears stated age ?Ears: normal TM's and external ear canals both ears ?Throat: lips, mucosa, and tongue normal; teeth and gums normal ?Neck: no adenopathy, no carotid bruit, supple, symmetrical, trachea midline and thyroid not enlarged, symmetric, no tenderness/mass/nodules ?Back: symmetric, no curvature. ROM normal. No CVA tenderness. ?Lungs: clear to auscultation bilaterally ?Heart: regular rate and rhythm, S1, S2 normal, no murmur, click, rub or gallop ?Abdomen: soft, non-tender; bowel sounds normal; no masses,  no organomegaly ?Pulses: 2+ and symmetric ?Skin: Skin color, texture, turgor normal. No rashes or lesions ?Lymph nodes: Cervical, supraclavicular, and axillary nodes normal. ?MSK:  full ROM right hip ,  no SI joint pain.  ?Psych: affect normal, makes good eye contact. No fidgeting,  Smiles easily.  Denies suicidal thoughts   ? ?Lab Results  ?Component Value Date  ? HGBA1C 5.4 11/24/2021  ? HGBA1C 5.8 08/21/2019  ? HGBA1C 5.9 04/13/2019  ? ? ?Lab Results  ?Component Value Date  ? CREATININE 1.43 (H) 11/24/2021  ? CREATININE 1.13 06/26/2021  ? CREATININE 1.13 10/08/2020  ? ? ?Lab Results  ?Component Value Date  ? WBC 7.3 11/24/2021  ? HGB 11.9 (L) 11/24/2021  ? HCT 35.9 (L) 11/24/2021  ? PLT 166.0 11/24/2021  ? GLUCOSE 96 11/24/2021  ? CHOL 121 11/24/2021  ? TRIG 48.0 11/24/2021  ? HDL 53.10 11/24/2021  ? [place] 58 11/24/2021  ? ALT 19  11/24/2021  ? AST 19 11/24/2021  ? NA 139 11/24/2021  ? K 4.7 11/24/2021  ? CL 108 11/24/2021  ? CREATININE 1.43 (H) 11/24/2021  ? BUN 39 (H) 11/24/2021  ? CO2 23 11/24/2021  ? TSH 2.67 11/24/2021  ? INR 1.0 02/16/2012  ? HGBA1C 5.4 11/24/2021  ? MICROALBUR 5.8 (H) 11/24/2021  ? ? ?MM 3D SCREEN BREAST BILATERAL ? ?Result Date: 06/12/2021 ?CLINICAL DATA:  Screening. EXAM: DIGITAL SCREENING BILATERAL MAMMOGRAM WITH TOMOSYNTHESIS AND CAD TECHNIQUE: Bilateral screening digital craniocaudal and mediolateral oblique mammograms were obtained. Bilateral screening digital breast tomosynthesis was performed. The images were evaluated with computer-aided detection. COMPARISON:  Previous exam(s).  ACR Breast Density Category c: The breast tissue is heterogeneously dense, which may obscure small masses. FINDINGS: There are no findings suspicious for malignancy. IMPRESSION: No mammographic evidence of malignancy. A result letter of this screening mammogram will be mailed directly to the patient. RECOMMENDATION: Screening mammogram in one year. (Code:SM-B-01Y) BI-RADS CATEGORY  1: Negative. Electronically Signed   By: Ammie Ferrier M.D.   On: 06/12/2021 14:04 ? ? ?Assessment & Plan:  ? ?Problem List Items Addressed This Visit   ? ? Aortic atherosclerosis ([place]) - Primary  ?  Reviewed findings of prior CT scan today..  Patient is tolerating high potency statin therapy  ? ?  ?  ? CKD (chronic kidney disease) stage 3, GFR 30-59 ml/min (HCC)  ?  Recent drop noted ; reminded patient to avoid NSAIDs and increase hydration.  Repeating today.  Continue telmisartan and refer to nephrology if no improvement  ? ?  ?  ? Relevant Orders  ? Basic metabolic panel  ? Episodes of formed visual hallucinations  ?  She remains adamant about what she has witnessed.. she refuses medications.  She is not a danger to herself or others.  ? ?  ?  ? Essential hypertension  ?  Well controlled on current regimen of telmisartan 20 mg daily.  She has minimal  proteinuria.    Renal function has declined but she has been using NSAID stable, no changes today. repeating ? ?  ?  ? History of herpes labialis  ?  Continue acyclovir to prevent outbreaks ? ?  ?  ? Paranoid

## 2021-12-03 NOTE — Assessment & Plan Note (Signed)
Reviewed findings of prior CT scan today..  Patient is tolerating high potency statin therapy  

## 2021-12-03 NOTE — Assessment & Plan Note (Signed)
Continue acyclovir to prevent outbreaks ?

## 2021-12-03 NOTE — Assessment & Plan Note (Addendum)
Well controlled on current regimen of telmisartan 20 mg daily.  She has minimal proteinuria.    Renal function has declined but she has been using NSAID stable, no changes today. repeating ?

## 2021-12-04 LAB — BASIC METABOLIC PANEL
BUN: 46 mg/dL — ABNORMAL HIGH (ref 6–23)
CO2: 23 mEq/L (ref 19–32)
Calcium: 9.2 mg/dL (ref 8.4–10.5)
Chloride: 107 mEq/L (ref 96–112)
Creatinine, Ser: 1.67 mg/dL — ABNORMAL HIGH (ref 0.40–1.20)
GFR: 27.97 mL/min — ABNORMAL LOW (ref >60.00)
Glucose, Bld: 126 mg/dL — ABNORMAL HIGH (ref 70–99)
Potassium: 4.2 mEq/L (ref 3.5–5.1)
Sodium: 138 mEq/L (ref 135–145)

## 2021-12-05 ENCOUNTER — Ambulatory Visit (INDEPENDENT_AMBULATORY_CARE_PROVIDER_SITE_OTHER): Payer: PPO

## 2021-12-05 VITALS — Ht 63.0 in | Wt 124.0 lb

## 2021-12-05 DIAGNOSIS — Z0000 Encounter for general adult medical examination without abnormal findings: Secondary | ICD-10-CM

## 2021-12-05 NOTE — Progress Notes (Addendum)
Subjective:   Audrey Duran is a 87 y.o. female who presents for Medicare Annual (Subsequent) preventive examination.  Review of Systems    No ROS.  Medicare Wellness Virtual Visit.  Visual/audio telehealth visit, UTA vital signs.   See social history for additional risk factors.   Cardiac Risk Factors include: advanced age (>75men, >63 women)     Objective:    Today's Vitals   12/05/21 0854  Weight: 124 lb (56.2 kg)  Height: 5\' 3"  (1.6 m)   Body mass index is 21.97 kg/m.     12/03/2020   12:44 PM 02/20/2020    8:59 PM 02/20/2020    4:52 PM 02/20/2020    7:10 AM 02/19/2020   10:15 PM 01/13/2020    2:00 PM 05/06/2017   11:38 AM  Advanced Directives  Does Patient Have a Medical Advance Directive? No No No No No Yes Yes  Type of Engineer, drilling of [place];Living will  Does patient want to make changes to medical advance directive?       No - Patient declined  Copy of Healthcare Power of Attorney in Chart?       No - copy requested  Would patient like information on creating a medical advance directive? No - Patient declined No - Patient declined No - Patient declined  No - Patient declined      Current Medications (verified) Outpatient Encounter Medications as of 12/05/2021  Medication Sig   acyclovir (ZOVIRAX) 400 MG tablet Take 1 tablet (400 mg total) by mouth daily.   alendronate (FOSAMAX) 70 MG tablet Take 1 tablet by mouth every 7 days on an empty stomach with a full glass of water   ALPRAZolam (XANAX) 0.5 MG tablet Take 0.5 tablets (0.25 mg total) by mouth at bedtime as needed for anxiety or sleep.   aspirin 81 MG tablet Take 81 mg by mouth at bedtime.   carbonyl iron (FEOSOL) 45 MG TABS tablet Take one by mouth daily   Cholecalciferol (VITAMIN D3) 3000 UNITS TABS Take 1 tablet by mouth daily. Take one by mouth daily   citalopram (CELEXA) 10 MG tablet Take by mouth every morning.   cyanocobalamin 1000 MCG tablet  Take one by mouth daily   furosemide (LASIX) 20 MG tablet Take 1/2 tablet by mouth (10mg ) daily   magnesium oxide (MAG-OX) 400 MG tablet Take 400 mg by mouth daily.   meclizine (ANTIVERT) 50 MG tablet Take 50 mg by mouth 3 (three) times daily as needed.   Omega-3 Fatty Acids (FISH OIL) 1200 MG CAPS Take 1 capsule by mouth daily. Take one by mouth daily   risperiDONE (RISPERDAL) 0.5 MG tablet Take 0.5 mg by mouth at bedtime.   rosuvastatin (CRESTOR) 20 MG tablet Take 1 tablet (20 mg total) by mouth daily.   telmisartan (MICARDIS) 20 MG tablet Take 1 tablet (20 mg total) by mouth daily.   timolol (TIMOPTIC) 0.5 % ophthalmic solution Place 1 drop into both eyes 2 (two) times daily.    No facility-administered encounter medications on file as of 12/05/2021.    Allergies (verified) Azithromycin, Clarithromycin, Diclofenac, Lisinopril, Meloxicam, Nitrofurantoin monohyd macro, Zocor [simvastatin], and Penicillins   History: Past Medical History:  Diagnosis Date   Anemia    Atrial fibrillation (HCC)    COPD (chronic obstructive pulmonary disease) (HCC)    Coronary artery disease 1995   s/p AMI , no history of stents,  [name]  Hyperlipidemia    Ischemic cardiomyopathy Dec 2011   ETT Sestamibi study apical scar, no ischemia, [name]   Myocardial infarction (HCC)    Recurrent HSV (herpes simplex virus)    Past Surgical History:  Procedure Laterality Date   ABDOMINAL HYSTERECTOMY  june 2014   BREAST EXCISIONAL BIOPSY Left 1990's   neg   CAROTID ENDARTERECTOMY  July 2007   Dew, right carotid   CATARACT EXTRACTION  2004, 2006   JOINT REPLACEMENT  July 2013   total hip , [place]   LAPAROSCOPIC APPENDECTOMY N/A 02/20/2020   Procedure: APPENDECTOMY LAPAROSCOPIC;  Surgeon: Leafy Ro, MD;  Location: [facility] ORS;  Service: General;  Laterality: N/A;   Family History  Problem Relation Age of Onset   Cancer Mother 29       Pancreatic    Heart disease Father    Cancer Sister 84        Breast Cancer   Breast cancer Sister 52   Multiple sclerosis Daughter    Social History   Socioeconomic History   Marital status: Divorced    Spouse name: Not on file   Number of children: Not on file   Years of education: Not on file   Highest education level: Not on file  Occupational History   Not on file  Tobacco Use   Smoking status: Former    Types: Cigarettes    Quit date: 01/13/1992    Years since quitting: 29.9   Smokeless tobacco: Never  Substance and Sexual Activity   Alcohol use: No    Alcohol/week: 0.0 standard drinks   Drug use: No   Sexual activity: Not Currently  Other Topics Concern   Not on file  Social History Narrative   Not on file   Social Determinants of Health   Financial Resource Strain: Not on file  Food Insecurity: Not on file  Transportation Needs: Not on file  Physical Activity: Not on file  Stress: Not on file  Social Connections: Not on file    Tobacco Counseling Counseling given: Not Answered   Clinical Intake:  Pre-visit preparation completed: Yes        Diabetes: No  How often do you need to have someone help you when you read instructions, pamphlets, or other written materials from your doctor or pharmacy?: 1 - Never  Interpreter Needed?: No      Activities of Daily Living    12/05/2021    8:55 AM  In your present state of health, do you have any difficulty performing the following activities:  Hearing? 0  Vision? 0  Difficulty concentrating or making decisions? 0  Walking or climbing stairs? 0  Dressing or bathing? 0  Doing errands, shopping? 0  Preparing Food and eating ? N  Using the Toilet? N  In the past six months, have you accidently leaked urine? N  Do you have problems with loss of bowel control? N  Managing your Medications? N  Managing your Finances? N  Housekeeping or managing your Housekeeping? N    Patient Care Team: Sherlene Shams, MD as PCP - General (Internal Medicine)  Indicate any  recent Medical Services you may have received from other than [facility] providers in the past year (date may be approximate).     Assessment:   This is a routine wellness examination for Keishia.  Virtual Visit via Telephone Note  I connected with  Jeanann Lewandowsky on 12/05/21 at  9:45 AM EDT by telephone and verified that  I am speaking with the correct person using two identifiers.  Persons participating in the virtual visit: patient/Nurse Health Advisor   I discussed the limitations of performing an evaluation and management service by telehealth. We continued and completed visit with audio only.Some vital signs may be absent or patient reported.   Hearing/Vision screen No results found.  Dietary issues and exercise activities discussed: Current Exercise Habits: Structured exercise class, Type of exercise: calisthenics;stretching;strength training/weights, Time (Minutes): 60, Frequency (Times/Week): 3, Weekly Exercise (Minutes/Week): 180, Intensity: Mild Regular diet Good water intake   Goals Addressed             This Visit's Progress    Maintain Healthy Lifestyle       Stay active Healthy diet/hydrated       Depression Screen    12/05/2021    8:59 AM 12/03/2021    2:37 PM 06/26/2021    8:15 AM 12/03/2020   12:41 PM 08/24/2020    2:46 PM 02/13/2020    3:37 PM 05/06/2017   11:02 AM  PHQ 2/9 Scores  PHQ - 2 Score 0 0 0 0 0 0 0  PHQ- 9 Score   0  1 0 0    Fall Risk    12/05/2021    8:59 AM 12/03/2021    2:37 PM 06/26/2021    8:15 AM 12/03/2020   12:44 PM 10/08/2020    1:36 PM  Fall Risk   Falls in the past year? 0 0 0 0 0  Number falls in past yr: 0   0   Injury with Fall?    0   Risk for fall due to :  No Fall Risks No Fall Risks    Follow up Falls evaluation completed Falls evaluation completed Falls evaluation completed Falls evaluation completed Falls evaluation completed    FALL RISK PREVENTION PERTAINING TO THE HOME: Home free of loose throw rugs in walkways,  pet beds, electrical cords, etc? Yes  Adequate lighting in your home to reduce risk of falls? Yes   ASSISTIVE DEVICES UTILIZED TO PREVENT FALLS: Life alert? No  Use of a cane, walker or w/c? No   TIMED UP AND GO: Was the test performed? No .   Cognitive Function:  Patient is alert and oriented x3.       12/05/2021    9:03 AM 12/03/2020   12:45 PM 05/06/2017   11:41 AM  6CIT Screen  What Year? 0 points 0 points 0 points  What month? 0 points 0 points 0 points  What time? 0 points 0 points 0 points  Count back from 20 0 points 0 points 0 points  Months in reverse 0 points 0 points 0 points  Repeat phrase 0 points  0 points  Total Score 0 points  0 points    Immunizations Immunization History  Administered Date(s) Administered   Fluad Quad(high Dose 65+) 04/13/2019   Influenza Split 03/29/2012, 04/26/2014, 05/16/2015   Influenza, High Dose Seasonal PF 05/06/2017, 04/16/2018, 04/13/2019   Influenza-Unspecified 06/06/2016, 05/20/2020, 05/11/2021   PFIZER(Purple Top)SARS-COV-2 Vaccination 08/03/2019, 08/24/2019, 04/23/2020   Pneumococcal Conjugate-13 10/26/2013   Pneumococcal Polysaccharide-23 10/31/2010, 11/14/2015   Pneumococcal-Unspecified 10/31/2010, 11/14/2015   Td 08/01/1985   Tdap 10/27/2011   Zoster Recombinat (Shingrix) 11/24/2017, 02/10/2018   Zoster, Live 10/26/2004   TDAP status: Due, Education has been provided regarding the importance of this vaccine. Advised may receive this vaccine at local pharmacy or Health Dept. Aware to provide a copy of the vaccination record  if obtained from local pharmacy or Health Dept. Verbalized acceptance and understanding.  Screening Tests Health Maintenance  Topic Date Due   COVID-19 Vaccine (4 - Booster for Pfizer series) 12/19/2021 (Originally 06/18/2020)   TETANUS/TDAP  12/06/2022 (Originally 10/26/2021)   INFLUENZA VACCINE  02/24/2022   MAMMOGRAM  06/12/2022   Pneumonia Vaccine 76+ Years old  Completed   DEXA SCAN   Completed   Zoster Vaccines- Shingrix  Completed   HPV VACCINES  Aged Out   Health Maintenance There are no preventive care reminders to display for this patient.  Lung Cancer Screening: (Low Dose CT Chest recommended if Age 70-80 years, 30 pack-year currently smoking OR have quit w/in 15years.) does not qualify.   Hepatitis C Screening: does not qualify.  Vision Screening: Recommended annual ophthalmology exams for early detection of glaucoma and other disorders of the eye.  Dental Screening: Recommended annual dental exams for proper oral hygiene  Community Resource Referral / Chronic Care Management: CRR required this visit?  No   CCM required this visit?  No      Plan:   Keep all routine maintenance appointments.   I have personally reviewed and noted the following in the patient's chart:   Medical and social history Use of alcohol, tobacco or illicit drugs  Current medications and supplements including opioid prescriptions.  Functional ability and status Nutritional status Physical activity Advanced directives List of other physicians Hospitalizations, surgeries, and ER visits in previous 12 months Vitals Screenings to include cognitive, depression, and falls Referrals and appointments  In addition, I have reviewed and discussed with patient certain preventive protocols, quality metrics, and best practice recommendations. A written personalized care plan for preventive services as well as general preventive health recommendations were provided to patient.     OBrien-Blaney, Asar Evilsizer L, LPN   1/61/0960     I have reviewed the above information and agree with above.   Duncan Dull, MD

## 2021-12-05 NOTE — Patient Instructions (Addendum)
?  Audrey Duran , ?Thank you for taking time to come for your Medicare Wellness Visit. I appreciate your ongoing commitment to your health goals. Please review the following plan we discussed and let me know if I can assist you in the future.  ? ?These are the goals we discussed: ? Goals   ? ?  Maintain Healthy Lifestyle   ?  Stay active ?Healthy diet/hydrated ?  ? ?  ?  ?This is a list of the screening recommended for you and due dates:  ?Health Maintenance  ?Topic Date Due  ? COVID-19 Vaccine (4 - Booster for Pfizer series) 12/19/2021*  ? Tetanus Vaccine  12/06/2022*  ? Flu Shot  02/24/2022  ? Mammogram  06/12/2022  ? Pneumonia Vaccine  Completed  ? DEXA scan (bone density measurement)  Completed  ? Zoster (Shingles) Vaccine  Completed  ? HPV Vaccine  Aged Out  ?*Topic was postponed. The date shown is not the original due date.  ?  ?

## 2021-12-12 ENCOUNTER — Telehealth: Payer: Self-pay

## 2021-12-12 DIAGNOSIS — N1831 Chronic kidney disease, stage 3a: Secondary | ICD-10-CM

## 2021-12-12 NOTE — Telephone Encounter (Signed)
Patient states she would like to know if the situation with her kidneys is serious.  Patient asked if we have sent the referral to the kidney doctor.  I let her know the referral is not in the system yet.

## 2021-12-12 NOTE — Telephone Encounter (Signed)
THE NEPHROLOGY REFERRAL HAS NOW BEEN MADE.  It is not serious.

## 2021-12-12 NOTE — Telephone Encounter (Signed)
Pt would like to see a Nephrologist in [place].

## 2021-12-12 NOTE — Telephone Encounter (Signed)
Do not see that the referral has been placed.

## 2021-12-15 NOTE — Telephone Encounter (Signed)
Spoke with pt and informed her that the referral has been placed and if she does not hear anything by the end of the week to please give [place] a call so we can check on the referral. Pt gave a verbal understanding.

## 2022-01-08 DIAGNOSIS — N179 Acute kidney failure, unspecified: Secondary | ICD-10-CM | POA: Diagnosis not present

## 2022-01-08 DIAGNOSIS — R82998 Other abnormal findings in urine: Secondary | ICD-10-CM | POA: Diagnosis not present

## 2022-01-08 DIAGNOSIS — N1832 Chronic kidney disease, stage 3b: Secondary | ICD-10-CM | POA: Diagnosis not present

## 2022-01-08 DIAGNOSIS — I129 Hypertensive chronic kidney disease with stage 1 through stage 4 chronic kidney disease, or unspecified chronic kidney disease: Secondary | ICD-10-CM | POA: Diagnosis not present

## 2022-01-09 ENCOUNTER — Telehealth: Payer: Self-pay | Admitting: Internal Medicine

## 2022-01-09 ENCOUNTER — Other Ambulatory Visit: Payer: Self-pay | Admitting: Internal Medicine

## 2022-01-09 NOTE — Telephone Encounter (Signed)
Spoke with pt and informed her of the message below. Pt gave a verbal understanding.  

## 2022-01-09 NOTE — Telephone Encounter (Signed)
Patient calls and states that her kidney doctor took her off furosemide (LASIX) 20 MG tablet and telmisartan (MICARDIS) 20 MG tablet. She wants to know if this will harm her heart.

## 2022-01-23 DIAGNOSIS — I5020 Unspecified systolic (congestive) heart failure: Secondary | ICD-10-CM | POA: Diagnosis not present

## 2022-01-23 DIAGNOSIS — I130 Hypertensive heart and chronic kidney disease with heart failure and stage 1 through stage 4 chronic kidney disease, or unspecified chronic kidney disease: Secondary | ICD-10-CM | POA: Diagnosis not present

## 2022-01-23 DIAGNOSIS — Z6821 Body mass index (BMI) 21.0-21.9, adult: Secondary | ICD-10-CM | POA: Diagnosis not present

## 2022-01-23 DIAGNOSIS — Z87891 Personal history of nicotine dependence: Secondary | ICD-10-CM | POA: Diagnosis not present

## 2022-01-23 DIAGNOSIS — N1832 Chronic kidney disease, stage 3b: Secondary | ICD-10-CM | POA: Diagnosis not present

## 2022-01-28 DIAGNOSIS — I129 Hypertensive chronic kidney disease with stage 1 through stage 4 chronic kidney disease, or unspecified chronic kidney disease: Secondary | ICD-10-CM | POA: Diagnosis not present

## 2022-01-28 DIAGNOSIS — N179 Acute kidney failure, unspecified: Secondary | ICD-10-CM | POA: Diagnosis not present

## 2022-01-28 DIAGNOSIS — N1832 Chronic kidney disease, stage 3b: Secondary | ICD-10-CM | POA: Diagnosis not present

## 2022-02-02 DIAGNOSIS — H353131 Nonexudative age-related macular degeneration, bilateral, early dry stage: Secondary | ICD-10-CM | POA: Diagnosis not present

## 2022-02-05 DIAGNOSIS — N1831 Chronic kidney disease, stage 3a: Secondary | ICD-10-CM | POA: Diagnosis not present

## 2022-02-05 DIAGNOSIS — N179 Acute kidney failure, unspecified: Secondary | ICD-10-CM | POA: Diagnosis not present

## 2022-02-05 DIAGNOSIS — I129 Hypertensive chronic kidney disease with stage 1 through stage 4 chronic kidney disease, or unspecified chronic kidney disease: Secondary | ICD-10-CM | POA: Diagnosis not present

## 2022-02-06 ENCOUNTER — Encounter: Payer: Self-pay | Admitting: Internal Medicine

## 2022-02-06 ENCOUNTER — Ambulatory Visit (INDEPENDENT_AMBULATORY_CARE_PROVIDER_SITE_OTHER): Payer: PPO | Admitting: Internal Medicine

## 2022-02-06 DIAGNOSIS — F411 Generalized anxiety disorder: Secondary | ICD-10-CM

## 2022-02-06 NOTE — Progress Notes (Unsigned)
Telephone Note    This format is felt to be most appropriate for this patient at this time.  All issues noted in this document were discussed and addressed.  No physical exam was performed (except for noted visual exam findings with Video Visits).   I connected withNAME on 02/06/22 at  4:30 PM EDT by  telephone and verified that I am speaking with the correct person using two identifiers. Location patient: home Location provider: work or home office Persons participating in the virtual visit: patient, provider  I discussed the limitations, risks, security and privacy concerns of performing an evaluation and management service by telephone and the availability of in person appointments. I also discussed with the patient that there may be a patient responsible charge related to this service. The patient expressed understanding and agreed to proceed.  Reason for visit: loss of hearing due to cerumen impaction   HPI:  87 yr old female  states that her hearing problem improved with use of ear candle on the left side   Hypotension:  Dr Candiss Norse stopped her telmisartan, losartan and furosemide for 90/56 bp.  She  is still taking aspirin and acyclovir.   but not taking rosuvastatin . She apparently was taking losartan and telmisartan at the same time .  She believes  he has stopped ALL OF HER MEDICATIONS  which is not true.  Advised to resume the rosuvastatin   Not taking alprazolam. Citalopram ,  ropinirole. .  Sleeping well,  waking up at 5 am , in bed by 9 pm.  [place] [facility] "religiously" using weights   Since she has suspend the medication she has been monitoring her BP daily and her systolics have been 154 to 143 .  Feet have not been swelling   CKD:  GFR is stable at 45 ml/min.  Follow up   ROS: See pertinent positives and negatives per HPI.  Past Medical History:  Diagnosis Date   Anemia    Atrial fibrillation ([facility])    COPD (chronic obstructive pulmonary disease) ([place])    Coronary artery  disease 1995   s/p AMI , no history of stents,  [name]   Hyperlipidemia    Ischemic cardiomyopathy Dec 2011   ETT Sestamibi study apical scar, no ischemia, [name]   Myocardial infarction ([place])    Recurrent HSV (herpes simplex virus)     Past Surgical History:  Procedure Laterality Date   ABDOMINAL HYSTERECTOMY  june 2014   BREAST EXCISIONAL BIOPSY Left 1990's   neg   CAROTID ENDARTERECTOMY  July 2007   Dew, right carotid   CATARACT EXTRACTION  2004, 2006   JOINT REPLACEMENT  July 2013   total hip , [place]   LAPAROSCOPIC APPENDECTOMY N/A 02/20/2020   Procedure: APPENDECTOMY LAPAROSCOPIC;  Surgeon: Jules Husbands, MD;  Location: [facility] ORS;  Service: General;  Laterality: N/A;    Family History  Problem Relation Age of Onset   Cancer Mother 85       Pancreatic    Heart disease Father    Cancer Sister 42       Breast Cancer   Breast cancer Sister 53   Multiple sclerosis Daughter     SOCIAL HX: ***   Current Outpatient Medications:    acyclovir (ZOVIRAX) 400 MG tablet, Take 1 tablet (400 mg total) by mouth daily., Disp: 90 tablet, Rfl: 1   alendronate (FOSAMAX) 70 MG tablet, Take 1 tablet by mouth every 7 days on an empty stomach with  a full glass of water, Disp: 12 tablet, Rfl: 2   aspirin 81 MG tablet, Take 81 mg by mouth at bedtime., Disp: , Rfl:    carbonyl iron (FEOSOL) 45 MG TABS tablet, Take one by mouth daily, Disp: , Rfl:    Cholecalciferol (VITAMIN D3) 3000 UNITS TABS, Take 1 tablet by mouth daily. Take one by mouth daily, Disp: 90 tablet, Rfl: 3   cyanocobalamin 1000 MCG tablet, Take one by mouth daily, Disp: , Rfl:    magnesium oxide (MAG-OX) 400 MG tablet, Take 400 mg by mouth daily., Disp: , Rfl:    Omega-3 Fatty Acids (FISH OIL) 1200 MG CAPS, Take 1 capsule by mouth daily. Take one by mouth daily, Disp: , Rfl:    timolol (TIMOPTIC) 0.5 % ophthalmic solution, Place 1 drop into both eyes 2 (two) times daily. , Disp: , Rfl:    ALPRAZolam (XANAX) 0.5 MG  tablet, Take 0.5 tablets (0.25 mg total) by mouth at bedtime as needed for anxiety or sleep. (Patient not taking: Reported on 02/06/2022), Disp: 15 tablet, Rfl: 5   citalopram (CELEXA) 10 MG tablet, Take by mouth every morning. (Patient not taking: Reported on 02/06/2022), Disp: , Rfl:    meclizine (ANTIVERT) 50 MG tablet, Take 50 mg by mouth 3 (three) times daily as needed. (Patient not taking: Reported on 02/06/2022), Disp: , Rfl:    risperiDONE (RISPERDAL) 0.5 MG tablet, Take 0.5 mg by mouth at bedtime. (Patient not taking: Reported on 02/06/2022), Disp: , Rfl:    rosuvastatin (CRESTOR) 20 MG tablet, Take 1 tablet (20 mg total) by mouth daily. (Patient not taking: Reported on 02/06/2022), Disp: 90 tablet, Rfl: 3  EXAM:  VITALS per patient if applicable:  GENERAL: alert, oriented, appears well and in no acute distress  HEENT: atraumatic, conjunttiva clear, no obvious abnormalities on inspection of external nose and ears  NECK: normal movements of the head and neck  LUNGS: on inspection no signs of respiratory distress, breathing rate appears normal, no obvious gross SOB, gasping or wheezing  CV: no obvious cyanosis  MS: moves all visible extremities without noticeable abnormality  PSYCH/NEURO: pleasant and cooperative, no obvious depression or anxiety, speech and thought processing grossly intact  ASSESSMENT AND PLAN:  Discussed the following assessment and plan:  No diagnosis found.  No problem-specific Assessment & Plan notes found for this encounter.    I discussed the assessment and treatment plan with the patient. The patient was provided an opportunity to ask questions and all were answered. The patient agreed with the plan and demonstrated an understanding of the instructions.   The patient was advised to call back or seek an in-person evaluation if the symptoms worsen or if the condition fails to improve as anticipated.   I spent 30 minutes dedicated to the care of this  patient on the date of this encounter to include pre-visit review of his medical history,  Face-to-face time with the patient , and post visit ordering of testing and therapeutics.    Crecencio Mc, MD

## 2022-02-08 NOTE — Assessment & Plan Note (Signed)
She appears less anxious since stopping her medication,  Is sleeping well and has not reported any hallucinations

## 2022-02-10 ENCOUNTER — Other Ambulatory Visit: Payer: Self-pay | Admitting: Internal Medicine

## 2022-02-11 ENCOUNTER — Other Ambulatory Visit: Payer: Self-pay

## 2022-02-11 NOTE — Telephone Encounter (Signed)
Patient states she would like to have a refill of her ALPRAZolam (XANAX) 0.5 MG tablet.     *Patient states her preferred pharmacy is [facility].

## 2022-02-11 NOTE — Telephone Encounter (Addendum)
Refilled:06/26/21 Last OV:02/06/22 Next OV: Not Scheduled

## 2022-02-12 NOTE — Telephone Encounter (Signed)
Spoke to Patient to let her know that her request for a refill on Alprazolam was denied due to her telling Dr. Derrel Nip that she no longer needed it. Dr. Derrel Nip said she will have to have an in person visit to determine if she needs it. Patient started crying and says she "only takes it if something happens and tears her up  and makes her heart beat fast." The patient states things happen fast and she is trying not to take the medication but occasionallly she does need it.

## 2022-02-25 DIAGNOSIS — H6123 Impacted cerumen, bilateral: Secondary | ICD-10-CM | POA: Diagnosis not present

## 2022-02-25 DIAGNOSIS — H60332 Swimmer's ear, left ear: Secondary | ICD-10-CM | POA: Diagnosis not present

## 2022-03-11 DIAGNOSIS — H9193 Unspecified hearing loss, bilateral: Secondary | ICD-10-CM | POA: Diagnosis not present

## 2022-03-11 DIAGNOSIS — H60332 Swimmer's ear, left ear: Secondary | ICD-10-CM | POA: Diagnosis not present

## 2022-03-28 ENCOUNTER — Other Ambulatory Visit: Payer: Self-pay | Admitting: Internal Medicine

## 2022-03-28 DIAGNOSIS — B009 Herpesviral infection, unspecified: Secondary | ICD-10-CM

## 2022-04-02 DIAGNOSIS — I129 Hypertensive chronic kidney disease with stage 1 through stage 4 chronic kidney disease, or unspecified chronic kidney disease: Secondary | ICD-10-CM | POA: Diagnosis not present

## 2022-04-02 DIAGNOSIS — N1832 Chronic kidney disease, stage 3b: Secondary | ICD-10-CM | POA: Diagnosis not present

## 2022-04-02 DIAGNOSIS — Z6821 Body mass index (BMI) 21.0-21.9, adult: Secondary | ICD-10-CM | POA: Diagnosis not present

## 2022-04-15 ENCOUNTER — Other Ambulatory Visit: Payer: Self-pay | Admitting: Internal Medicine

## 2022-04-15 NOTE — Telephone Encounter (Signed)
Refilled: 06/26/2021 Last OV: 02/06/2022 Next OV: not scheduled

## 2022-04-15 NOTE — Telephone Encounter (Signed)
Pt need a refill on [facility]AZolam [facility]

## 2022-04-16 ENCOUNTER — Telehealth: Payer: Self-pay

## 2022-04-16 ENCOUNTER — Other Ambulatory Visit: Payer: Self-pay | Admitting: Internal Medicine

## 2022-04-16 NOTE — Telephone Encounter (Signed)
Patient states she fell last Thursday (04/09/2022) and has not been able to sleep.  Patient states she would like for Dr. Deborra Medina to prescribe something to help her sleep.  *Patient states her preferred pharmacy is [facility].

## 2022-04-20 NOTE — Telephone Encounter (Signed)
Spoke with pt and advised that she will need to schedule an appt with Dr. Derrel Nip to discuss medications to help her sleep. Pt gave a verbal understanding and scheduled an appt for next week.

## 2022-04-27 ENCOUNTER — Encounter: Payer: Self-pay | Admitting: Internal Medicine

## 2022-04-27 ENCOUNTER — Ambulatory Visit (INDEPENDENT_AMBULATORY_CARE_PROVIDER_SITE_OTHER): Payer: PPO | Admitting: Internal Medicine

## 2022-04-27 VITALS — BP 128/62 | HR 52 | Temp 97.6°F | Ht 63.0 in | Wt 119.8 lb

## 2022-04-27 DIAGNOSIS — E785 Hyperlipidemia, unspecified: Secondary | ICD-10-CM

## 2022-04-27 DIAGNOSIS — I10 Essential (primary) hypertension: Secondary | ICD-10-CM | POA: Diagnosis not present

## 2022-04-27 DIAGNOSIS — N179 Acute kidney failure, unspecified: Secondary | ICD-10-CM

## 2022-04-27 DIAGNOSIS — F29 Unspecified psychosis not due to a substance or known physiological condition: Secondary | ICD-10-CM

## 2022-04-27 DIAGNOSIS — Z23 Encounter for immunization: Secondary | ICD-10-CM | POA: Diagnosis not present

## 2022-04-27 DIAGNOSIS — D721 Eosinophilia, unspecified: Secondary | ICD-10-CM | POA: Diagnosis not present

## 2022-04-27 DIAGNOSIS — F22 Delusional disorders: Secondary | ICD-10-CM | POA: Diagnosis not present

## 2022-04-27 DIAGNOSIS — B009 Herpesviral infection, unspecified: Secondary | ICD-10-CM | POA: Diagnosis not present

## 2022-04-27 LAB — RENAL FUNCTION PANEL
Albumin: 3.8 g/dL (ref 3.5–5.2)
BUN: 23 mg/dL (ref 6–23)
CO2: 27 mEq/L (ref 19–32)
Calcium: 9.5 mg/dL (ref 8.4–10.5)
Chloride: 106 mEq/L (ref 96–112)
Creatinine, Ser: 1.09 mg/dL (ref 0.40–1.20)
GFR: 46.54 mL/min — ABNORMAL LOW (ref >60.00)
Glucose, Bld: 89 mg/dL (ref 70–99)
Phosphorus: 2.8 mg/dL (ref 2.3–4.6)
Potassium: 4.1 mEq/L (ref 3.5–5.1)
Sodium: 139 mEq/L (ref 135–145)

## 2022-04-27 LAB — CBC WITH DIFFERENTIAL/PLATELET
Basophils Absolute: 0.1 10*3/uL (ref 0.0–0.1)
Basophils Relative: 0.7 % (ref 0.0–3.0)
Eosinophils Absolute: 0.2 10*3/uL (ref 0.0–0.7)
Eosinophils Relative: 2.6 % (ref 0.0–5.0)
HCT: 37 % (ref 36.0–46.0)
Hemoglobin: 12.5 g/dL (ref 12.0–15.0)
Lymphocytes Relative: 24.4 % (ref 12.0–46.0)
Lymphs Abs: 1.7 10*3/uL (ref 0.7–4.0)
MCHC: 33.8 g/dL (ref 30.0–36.0)
MCV: 91.3 fl (ref 78.0–100.0)
Monocytes Absolute: 0.6 10*3/uL (ref 0.1–1.0)
Monocytes Relative: 8.5 % (ref 3.0–12.0)
Neutro Abs: 4.5 10*3/uL (ref 1.4–7.7)
Neutrophils Relative %: 63.8 % (ref 43.0–77.0)
Platelets: 162 10*3/uL (ref 150.0–400.0)
RBC: 4.05 Mil/uL (ref 3.87–5.11)
RDW: 13.1 % (ref 11.5–15.5)
WBC: 7.1 10*3/uL (ref 4.0–10.5)

## 2022-04-27 LAB — LIPID PANEL
Cholesterol: 105 mg/dL (ref 0–200)
HDL: 42.7 mg/dL (ref >39.00)
LDL Cholesterol: 47 mg/dL (ref 0–99)
NonHDL: 62.68
Total CHOL/HDL Ratio: 2
Triglycerides: 76 mg/dL (ref 0.0–149.0)
VLDL: 15.2 mg/dL (ref 0.0–40.0)

## 2022-04-27 MED ORDER — ALENDRONATE SODIUM 70 MG PO TABS: ORAL_TABLET | ORAL | 2 refills | Status: DC

## 2022-04-27 MED ORDER — ROSUVASTATIN CALCIUM 20 MG PO TABS: 20.0000 mg | ORAL_TABLET | Freq: Every day | ORAL | 3 refills | Status: DC

## 2022-04-27 MED ORDER — TELMISARTAN 20 MG PO TABS: 20.0000 mg | ORAL_TABLET | Freq: Every day | ORAL | 1 refills | Status: DC

## 2022-04-27 MED ORDER — VITAMIN D3 75 MCG (3000 UT) PO TABS: 1.0000 | ORAL_TABLET | Freq: Every day | ORAL | 3 refills | Status: DC

## 2022-04-27 MED ORDER — RISPERIDONE 0.25 MG PO TABS: 0.2500 mg | ORAL_TABLET | Freq: Every day | ORAL | 2 refills | Status: DC

## 2022-04-27 NOTE — Assessment & Plan Note (Signed)
Resolved.  Etiology unclear   Resume telmisartan and repeat BMET one week

## 2022-04-27 NOTE — Patient Instructions (Signed)
I want you to resume telmisartan starting with 20 mg daily (lowest dose) because your blood pressure is high again   I want you to resume risperidone  at 0.25 mg (lowest dose)  for insomnia /  take it 30 minutes before bedtime

## 2022-04-27 NOTE — Assessment & Plan Note (Signed)
Continue use of risperidone to manage insomnia triggered by her delusions

## 2022-04-27 NOTE — Assessment & Plan Note (Signed)
Secondary to anxiety,  Now managed with risperidone

## 2022-04-27 NOTE — Assessment & Plan Note (Addendum)
LDL is at goal on rosuvastatin   Lab Results  Component Value Date   CHOL 105 04/27/2022   HDL 42.70 04/27/2022   LDLCALC 47 04/27/2022   TRIG 76.0 04/27/2022   CHOLHDL 2 04/27/2022
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
## 2022-04-27 NOTE — Assessment & Plan Note (Addendum)
Elevated due to suspension of medications by nephrology one month ago. Resuming telmisartan only  And return in one week    Lab Results  Component Value Date   CREATININE 1.09 04/27/2022   Lab Results  Component Value Date   NA 139 04/27/2022   K 4.1 04/27/2022   CL 106 04/27/2022   CO2 27 04/27/2022

## 2022-04-27 NOTE — Assessment & Plan Note (Signed)
Advised to continue daily suppressive medication

## 2022-04-29 ENCOUNTER — Telehealth: Payer: Self-pay

## 2022-04-29 MED ORDER — ALPRAZOLAM 0.5 MG PO TABS: 0.5000 mg | ORAL_TABLET | Freq: Every evening | ORAL | 5 refills | Status: AC | PRN

## 2022-04-29 NOTE — Telephone Encounter (Signed)
Pt is aware.  

## 2022-04-29 NOTE — Telephone Encounter (Signed)
Patient states Dr. Deborra Medina gave her a prescription for risperiDONE (RISPERDAL) 0.25 MG tablet, and she would like to let her know that it is not working for her.  Patient states the first night she had a terrible headache and the next night she wasn't able to sleep.  Patient states Dr. Derrel Nip gave her a prescription for Zanax, which patient states Dr. Derrel Nip really doesn't want her to take, but patient states she is planning to take it because she needs some sleep.

## 2022-04-29 NOTE — Telephone Encounter (Signed)
FYI

## 2022-04-29 NOTE — Telephone Encounter (Signed)
Attempted to call Patient- no answer/voicemail 

## 2022-05-07 ENCOUNTER — Telehealth: Payer: Self-pay | Admitting: Internal Medicine

## 2022-05-07 DIAGNOSIS — I10 Essential (primary) hypertension: Secondary | ICD-10-CM

## 2022-05-07 NOTE — Telephone Encounter (Signed)
Patient has a lab appt 10/16/203, there are no orders in.

## 2022-05-08 NOTE — Addendum Note (Signed)
Addended by: Adair Laundry on: 05/08/2022 05:28 PM   Modules accepted: Orders

## 2022-05-08 NOTE — Telephone Encounter (Signed)
Lab has been ordered.

## 2022-05-11 ENCOUNTER — Other Ambulatory Visit: Payer: PPO

## 2022-05-12 ENCOUNTER — Other Ambulatory Visit (INDEPENDENT_AMBULATORY_CARE_PROVIDER_SITE_OTHER): Payer: PPO

## 2022-05-12 DIAGNOSIS — I10 Essential (primary) hypertension: Secondary | ICD-10-CM | POA: Diagnosis not present

## 2022-05-12 DIAGNOSIS — H9193 Unspecified hearing loss, bilateral: Secondary | ICD-10-CM | POA: Diagnosis not present

## 2022-05-12 LAB — BASIC METABOLIC PANEL
BUN: 19 mg/dL (ref 6–23)
CO2: 27 mEq/L (ref 19–32)
Calcium: 10 mg/dL (ref 8.4–10.5)
Chloride: 105 mEq/L (ref 96–112)
Creatinine, Ser: 1.01 mg/dL (ref 0.40–1.20)
GFR: 50.98 mL/min — ABNORMAL LOW (ref >60.00)
Glucose, Bld: 98 mg/dL (ref 70–99)
Potassium: 4.1 mEq/L (ref 3.5–5.1)
Sodium: 139 mEq/L (ref 135–145)

## 2022-05-27 ENCOUNTER — Other Ambulatory Visit: Payer: Self-pay | Admitting: Internal Medicine

## 2022-05-27 DIAGNOSIS — Z1231 Encounter for screening mammogram for malignant neoplasm of breast: Secondary | ICD-10-CM

## 2022-07-16 DIAGNOSIS — N1831 Chronic kidney disease, stage 3a: Secondary | ICD-10-CM | POA: Diagnosis not present

## 2022-07-16 DIAGNOSIS — I129 Hypertensive chronic kidney disease with stage 1 through stage 4 chronic kidney disease, or unspecified chronic kidney disease: Secondary | ICD-10-CM | POA: Diagnosis not present

## 2022-07-30 DIAGNOSIS — I214 Non-ST elevation (NSTEMI) myocardial infarction: Secondary | ICD-10-CM | POA: Diagnosis not present

## 2022-07-30 DIAGNOSIS — I426 Alcoholic cardiomyopathy: Secondary | ICD-10-CM | POA: Diagnosis not present

## 2022-07-30 DIAGNOSIS — Z9889 Other specified postprocedural states: Secondary | ICD-10-CM | POA: Diagnosis not present

## 2022-07-30 DIAGNOSIS — I729 Aneurysm of unspecified site: Secondary | ICD-10-CM | POA: Diagnosis not present

## 2022-07-30 DIAGNOSIS — I10 Essential (primary) hypertension: Secondary | ICD-10-CM | POA: Diagnosis not present

## 2022-07-30 DIAGNOSIS — I255 Ischemic cardiomyopathy: Secondary | ICD-10-CM | POA: Diagnosis not present

## 2022-07-30 DIAGNOSIS — R001 Bradycardia, unspecified: Secondary | ICD-10-CM | POA: Diagnosis not present

## 2022-07-30 DIAGNOSIS — E785 Hyperlipidemia, unspecified: Secondary | ICD-10-CM | POA: Diagnosis not present

## 2022-07-30 DIAGNOSIS — J449 Chronic obstructive pulmonary disease, unspecified: Secondary | ICD-10-CM | POA: Diagnosis not present

## 2022-08-06 ENCOUNTER — Ambulatory Visit
Admission: RE | Admit: 2022-08-06 | Discharge: 2022-08-06 | Disposition: A | Discharge status: Home / Self Care | Payer: PPO | Source: Ambulatory Visit | Attending: Internal Medicine | Admitting: Internal Medicine

## 2022-08-06 DIAGNOSIS — Z1231 Encounter for screening mammogram for malignant neoplasm of breast: Secondary | ICD-10-CM | POA: Diagnosis not present

## 2022-09-24 ENCOUNTER — Telehealth: Payer: Self-pay | Admitting: Internal Medicine

## 2022-09-24 NOTE — Telephone Encounter (Signed)
Attempted to call answer no voicemail.

## 2022-09-24 NOTE — Telephone Encounter (Signed)
Pt called stating she would like to go over her medication with the cma

## 2022-09-28 NOTE — Telephone Encounter (Signed)
Attempted to call answer no voicemail.

## 2022-09-30 NOTE — Telephone Encounter (Signed)
Spoke with pt this morning and she could not remember that she had called [place] last week nor could remember what she had called about. Pt asked if her daughter [name] had called [place] and that was why we was calling her. I explained to pt that she had called last week about her medications. She tried to talk about her medications but the only thing she could tell me was it's a medication that starts with a "T". I asked pt if she knew the actual name so I could try to help her out and she stated no she had no idea. Pt then went one to say that her daughter thinks she needs to be on a medication to help her with something but pt couldn't remember what her daughter said. I advised pt that we schedule her an appt to discuss but she will need to bring all her medications with her to the appt so that we can help her out. Pt gave a verbal understanding and has been scheduled for Friday at 11 am.

## 2022-10-02 ENCOUNTER — Telehealth: Payer: Self-pay | Admitting: *Deleted

## 2022-10-02 ENCOUNTER — Ambulatory Visit (INDEPENDENT_AMBULATORY_CARE_PROVIDER_SITE_OTHER): Payer: PPO | Admitting: Internal Medicine

## 2022-10-02 ENCOUNTER — Telehealth: Payer: Self-pay

## 2022-10-02 ENCOUNTER — Encounter: Payer: Self-pay | Admitting: Internal Medicine

## 2022-10-02 VITALS — BP 116/74 | HR 65 | Temp 97.5°F | Resp 16 | Ht 63.0 in | Wt 109.1 lb

## 2022-10-02 DIAGNOSIS — R634 Abnormal weight loss: Secondary | ICD-10-CM

## 2022-10-02 DIAGNOSIS — R7301 Impaired fasting glucose: Secondary | ICD-10-CM | POA: Diagnosis not present

## 2022-10-02 DIAGNOSIS — I480 Paroxysmal atrial fibrillation: Secondary | ICD-10-CM

## 2022-10-02 DIAGNOSIS — R410 Disorientation, unspecified: Secondary | ICD-10-CM | POA: Diagnosis not present

## 2022-10-02 DIAGNOSIS — F29 Unspecified psychosis not due to a substance or known physiological condition: Secondary | ICD-10-CM

## 2022-10-02 DIAGNOSIS — B009 Herpesviral infection, unspecified: Secondary | ICD-10-CM

## 2022-10-02 DIAGNOSIS — R441 Visual hallucinations: Secondary | ICD-10-CM

## 2022-10-02 LAB — CBC WITH DIFFERENTIAL/PLATELET
Basophils Absolute: 0.1 10*3/uL (ref 0.0–0.1)
Basophils Relative: 0.7 % (ref 0.0–3.0)
Eosinophils Absolute: 0.2 10*3/uL (ref 0.0–0.7)
Eosinophils Relative: 2.1 % (ref 0.0–5.0)
HCT: 39.2 % (ref 36.0–46.0)
Hemoglobin: 13.1 g/dL (ref 12.0–15.0)
Lymphocytes Relative: 24.5 % (ref 12.0–46.0)
Lymphs Abs: 1.9 10*3/uL (ref 0.7–4.0)
MCHC: 33.3 g/dL (ref 30.0–36.0)
MCV: 91.7 fl (ref 78.0–100.0)
Monocytes Absolute: 0.5 10*3/uL (ref 0.1–1.0)
Monocytes Relative: 6.7 % (ref 3.0–12.0)
Neutro Abs: 5 10*3/uL (ref 1.4–7.7)
Neutrophils Relative %: 66 % (ref 43.0–77.0)
Platelets: 172 10*3/uL (ref 150.0–400.0)
RBC: 4.27 Mil/uL (ref 3.87–5.11)
RDW: 13.3 % (ref 11.5–15.5)
WBC: 7.6 10*3/uL (ref 4.0–10.5)

## 2022-10-02 LAB — COMPREHENSIVE METABOLIC PANEL
ALT: 11 U/L (ref 0–35)
AST: 15 U/L (ref 0–37)
Albumin: 3.8 g/dL (ref 3.5–5.2)
Alkaline Phosphatase: 51 U/L (ref 39–117)
BUN: 19 mg/dL (ref 6–23)
CO2: 27 mEq/L (ref 19–32)
Calcium: 10.6 mg/dL — ABNORMAL HIGH (ref 8.4–10.5)
Chloride: 105 mEq/L (ref 96–112)
Creatinine, Ser: 1.11 mg/dL (ref 0.40–1.20)
GFR: 45.4 mL/min — ABNORMAL LOW (ref >60.00)
Glucose, Bld: 105 mg/dL — ABNORMAL HIGH (ref 70–99)
Potassium: 4 mEq/L (ref 3.5–5.1)
Sodium: 140 mEq/L (ref 135–145)
Total Bilirubin: 0.6 mg/dL (ref 0.2–1.2)
Total Protein: 6.7 g/dL (ref 6.0–8.3)

## 2022-10-02 LAB — HEMOGLOBIN A1C: Hgb A1c MFr Bld: 5.5 % (ref 4.6–6.5)

## 2022-10-02 LAB — MAGNESIUM: Magnesium: 1.7 mg/dL (ref 1.5–2.5)

## 2022-10-02 LAB — B12 AND FOLATE PANEL
Folate: 15 ng/mL (ref >5.9)
Vitamin B-12: 1069 pg/mL — ABNORMAL HIGH (ref 211–911)

## 2022-10-02 LAB — TSH: TSH: 2.26 u[IU]/mL (ref 0.35–5.50)

## 2022-10-02 MED ORDER — TELMISARTAN 20 MG PO TABS: 20.0000 mg | ORAL_TABLET | Freq: Every day | ORAL | 1 refills | Status: DC

## 2022-10-02 MED ORDER — ACYCLOVIR 400 MG PO TABS: 400.0000 mg | ORAL_TABLET | Freq: Every day | ORAL | 1 refills | Status: DC
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
## 2022-10-02 NOTE — Telephone Encounter (Signed)
LM FOR PT TO CB RE : left AVS and samples in restroom at the office.

## 2022-10-02 NOTE — Assessment & Plan Note (Signed)
Exam is normal today.

## 2022-10-02 NOTE — Assessment & Plan Note (Signed)
Continue acyclovir for suppression
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
## 2022-10-02 NOTE — Patient Instructions (Addendum)
For the constipation  I want you start using Benefiber EVERY DAY  ,  you can mix it in your coffee  Continue using glycerin suppositories TO avoid irritating your hemorrhoids  PLEASE start drinking a protein shake EVERY DAY TO GAIN WEIGHT  YOU CAN ADD CHOCOLATE SYRUP TO THE VANILLA SHAKES  I AM ASKING THE [facility] SOCIAL WORKER TO HELP GET YOU MORE SAMPLES

## 2022-10-02 NOTE — Assessment & Plan Note (Signed)
15 lbs since July 2023, screening labs ordered.  Appetite is diminished . She is up to date on breast cancer screening.  Last colonoscopy 2006.

## 2022-10-02 NOTE — Progress Notes (Unsigned)
  Care Coordination  Outreach Note  10/02/2022 Name: CORONA POPOVICH MRN: [id number] DOB: 1935/02/26   Care Coordination Outreach Attempts: An unsuccessful telephone outreach was attempted today to offer the patient information about available care coordination services as a benefit of their health plan.   Follow Up Plan:  Additional outreach attempts will be made to offer the patient care coordination information and services.   Encounter Outcome:  No Answer  Julian Hy, [place] Direct Dial: [phone]

## 2022-10-03 LAB — URINALYSIS, ROUTINE W REFLEX MICROSCOPIC
Bacteria, UA: NONE SEEN /HPF
Bilirubin Urine: NEGATIVE
Glucose, UA: NEGATIVE
Hgb urine dipstick: NEGATIVE
Hyaline Cast: NONE SEEN /LPF
Ketones, ur: NEGATIVE
Nitrite: NEGATIVE
Protein, ur: NEGATIVE
RBC / HPF: NONE SEEN /HPF (ref 0–2)
Specific Gravity, Urine: 1.012 (ref 1.001–1.035)
Squamous Epithelial / HPF: NONE SEEN /HPF (ref <=5)
pH: 5.5 (ref 5.0–8.0)

## 2022-10-03 LAB — URINE CULTURE
MICRO NUMBER:: 14668396
SPECIMEN QUALITY:: ADEQUATE

## 2022-10-03 LAB — IRON,TIBC AND FERRITIN PANEL
%SAT: 29 % (calc) (ref 16–45)
Ferritin: 618 ng/mL — ABNORMAL HIGH (ref 16–288)
Iron: 81 ug/dL (ref 45–160)
TIBC: 275 mcg/dL (calc) (ref 250–450)

## 2022-10-03 LAB — MICROSCOPIC MESSAGE

## 2022-10-05 ENCOUNTER — Telehealth: Payer: Self-pay | Admitting: Internal Medicine

## 2022-10-05 NOTE — Telephone Encounter (Signed)
LM FOR PT TO CB   Crecencio Mc, MD  Adair Laundry, CMA All labs  are normal except kideny function which is low but stable and unchanged   No evidence of UTI by UA , but culture is pending at time of results

## 2022-10-05 NOTE — Progress Notes (Signed)
  Care Coordination  Outreach Note  10/05/2022 Name: BRIGITTE SODERBERG MRN: [id number] DOB: 10-Jan-1935   Care Coordination Outreach Attempts: A second unsuccessful outreach was attempted today to offer the patient with information about available care coordination services as a benefit of their health plan.     Follow Up Plan:  Additional outreach attempts will be made to offer the patient care coordination information and services.   Encounter Outcome:  No Answer  Julian Hy, [place] Direct Dial: [phone]

## 2022-10-05 NOTE — Telephone Encounter (Signed)
Patient's daughter would like a FL2 form completed. She is looking for a nursing home for her.

## 2022-10-06 ENCOUNTER — Telehealth: Payer: Self-pay | Admitting: *Deleted

## 2022-10-06 NOTE — Telephone Encounter (Signed)
Left Message for [name] of [place] [facility] to call office.

## 2022-10-06 NOTE — Telephone Encounter (Signed)
Spoke with pt's daughter to let her know that she is not on the [facility]. She stated that she was in the office with the pt last Friday and spoke with Dr. Derrel Nip for a few minutes after she had to bring the pt back up here to give the urine sample pt had forgot to give. Daughter is starting to look for nursing homes for pt and needs an FL2 form filled it. She stated that she is requesting this because of the pt's memory and she is afraid for pt to be living on her on. Pt is having hallucinations see the result note message for the details from when I spoke with pt this morning.

## 2022-10-06 NOTE — Progress Notes (Signed)
Left Message on voice mail  for Audrey Duran at [place] awaiting return call.

## 2022-10-06 NOTE — Telephone Encounter (Signed)
-----   Message from Crecencio Mc, MD sent at 10/06/2022 11:53 AM EDT ----- Regarding: Adult protective services Is there someone we can contact at [facility]Ps?  Patient is having more hallucinations and refuses to take medications. I made a [facility] consult last week at her visit,  her daughter was present at the visit but lives in [place] and requested in home help.  ----- Message ----- From: Adair Laundry, [place]: 10/06/2022   9:43 AM EDT To: Crecencio Mc, MD  I called pt in regards to her lab results. Pt started getting upset telling me that she has been keeping these kids and they are being mean to her. She stated that she is trying to get rid of them but they keep coming. She asked that we come help her or if they show back up she is going to come up here. 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 She kept saying that they were being mean to her.

## 2022-10-07 ENCOUNTER — Emergency Department
Admission: EM | Admit: 2022-10-07 | Discharge: 2022-10-15 | Disposition: A | Discharge status: Home / Self Care | Payer: PPO | Attending: Emergency Medicine | Admitting: Emergency Medicine

## 2022-10-07 ENCOUNTER — Emergency Department: Payer: PPO

## 2022-10-07 ENCOUNTER — Other Ambulatory Visit: Payer: Self-pay

## 2022-10-07 DIAGNOSIS — F0390 Unspecified dementia without behavioral disturbance: Secondary | ICD-10-CM | POA: Diagnosis not present

## 2022-10-07 DIAGNOSIS — F419 Anxiety disorder, unspecified: Secondary | ICD-10-CM | POA: Insufficient documentation

## 2022-10-07 DIAGNOSIS — Z111 Encounter for screening for respiratory tuberculosis: Secondary | ICD-10-CM | POA: Diagnosis not present

## 2022-10-07 DIAGNOSIS — F411 Generalized anxiety disorder: Secondary | ICD-10-CM | POA: Diagnosis present

## 2022-10-07 DIAGNOSIS — F22 Delusional disorders: Secondary | ICD-10-CM | POA: Diagnosis not present

## 2022-10-07 DIAGNOSIS — Z20822 Contact with and (suspected) exposure to covid-19: Secondary | ICD-10-CM | POA: Diagnosis not present

## 2022-10-07 DIAGNOSIS — R4182 Altered mental status, unspecified: Secondary | ICD-10-CM | POA: Insufficient documentation

## 2022-10-07 DIAGNOSIS — R630 Anorexia: Secondary | ICD-10-CM | POA: Diagnosis not present

## 2022-10-07 DIAGNOSIS — E46 Unspecified protein-calorie malnutrition: Secondary | ICD-10-CM | POA: Insufficient documentation

## 2022-10-07 DIAGNOSIS — S0990XA Unspecified injury of head, initial encounter: Secondary | ICD-10-CM | POA: Diagnosis not present

## 2022-10-07 DIAGNOSIS — Z046 Encounter for general psychiatric examination, requested by authority: Secondary | ICD-10-CM | POA: Insufficient documentation

## 2022-10-07 DIAGNOSIS — B009 Herpesviral infection, unspecified: Secondary | ICD-10-CM

## 2022-10-07 DIAGNOSIS — F03911 Unspecified dementia, unspecified severity, with agitation: Secondary | ICD-10-CM

## 2022-10-07 DIAGNOSIS — R451 Restlessness and agitation: Secondary | ICD-10-CM | POA: Diagnosis not present

## 2022-10-07 DIAGNOSIS — R441 Visual hallucinations: Secondary | ICD-10-CM | POA: Diagnosis not present

## 2022-10-07 LAB — COMPREHENSIVE METABOLIC PANEL
ALT: 15 U/L (ref 0–44)
AST: 20 U/L (ref 15–41)
Albumin: 3.7 g/dL (ref 3.5–5.0)
Alkaline Phosphatase: 55 U/L (ref 38–126)
Anion gap: 10 (ref 5–15)
BUN: 27 mg/dL — ABNORMAL HIGH (ref 8–23)
CO2: 25 mmol/L (ref 22–32)
Calcium: 10.1 mg/dL (ref 8.9–10.3)
Chloride: 103 mmol/L (ref 98–111)
Creatinine, Ser: 1.03 mg/dL — ABNORMAL HIGH (ref 0.44–1.00)
GFR, Estimated: 53 mL/min — ABNORMAL LOW (ref >60)
Glucose, Bld: 113 mg/dL — ABNORMAL HIGH (ref 70–99)
Potassium: 4.2 mmol/L (ref 3.5–5.1)
Sodium: 138 mmol/L (ref 135–145)
Total Bilirubin: 0.9 mg/dL (ref 0.3–1.2)
Total Protein: 7 g/dL (ref 6.5–8.1)

## 2022-10-07 LAB — URINE DRUG SCREEN, QUALITATIVE (ARMC ONLY)
Amphetamines, Ur Screen: NOT DETECTED
Barbiturates, Ur Screen: NOT DETECTED
Benzodiazepine, Ur Scrn: POSITIVE — AB
Cannabinoid 50 Ng, Ur ~~LOC~~: NOT DETECTED
Cocaine Metabolite,Ur ~~LOC~~: NOT DETECTED
MDMA (Ecstasy)Ur Screen: NOT DETECTED
Methadone Scn, Ur: NOT DETECTED
Opiate, Ur Screen: NOT DETECTED
Phencyclidine (PCP) Ur S: NOT DETECTED
Tricyclic, Ur Screen: NOT DETECTED

## 2022-10-07 LAB — URINALYSIS, ROUTINE W REFLEX MICROSCOPIC
Bilirubin Urine: NEGATIVE
Glucose, UA: NEGATIVE mg/dL
Hgb urine dipstick: NEGATIVE
Ketones, ur: NEGATIVE mg/dL
Leukocytes,Ua: NEGATIVE
Nitrite: NEGATIVE
Protein, ur: NEGATIVE mg/dL
Specific Gravity, Urine: 1.006 (ref 1.005–1.030)
pH: 6 (ref 5.0–8.0)

## 2022-10-07 LAB — ACETAMINOPHEN LEVEL: Acetaminophen (Tylenol), Serum: <10 ug/mL — ABNORMAL LOW (ref 10–30)

## 2022-10-07 LAB — ETHANOL: Alcohol, Ethyl (B): <10 mg/dL (ref <10)

## 2022-10-07 LAB — CBC
HCT: 40.1 % (ref 36.0–46.0)
Hemoglobin: 12.5 g/dL (ref 12.0–15.0)
MCH: 30.1 pg (ref 26.0–34.0)
MCHC: 31.2 g/dL (ref 30.0–36.0)
MCV: 96.6 fL (ref 80.0–100.0)
Platelets: 162 10*3/uL (ref 150–400)
RBC: 4.15 MIL/uL (ref 3.87–5.11)
RDW: 12.5 % (ref 11.5–15.5)
WBC: 7.1 10*3/uL (ref 4.0–10.5)
nRBC: 0 % (ref 0.0–0.2)

## 2022-10-07 LAB — SALICYLATE LEVEL: Salicylate Lvl: <7 mg/dL — ABNORMAL LOW (ref 7.0–30.0)

## 2022-10-07 MED ORDER — MIRTAZAPINE 15 MG PO TABS
7.5000 mg | ORAL_TABLET | Freq: Every day | ORAL | Status: DC
  Administered 2022-10-07 – 2022-10-14 (×7): 7.5 mg via ORAL
  Filled 2022-10-07 (×8): qty 1

## 2022-10-07 MED ORDER — IRBESARTAN 75 MG PO TABS
75.0000 mg | ORAL_TABLET | Freq: Every day | ORAL | Status: DC
  Administered 2022-10-07 – 2022-10-15 (×7): 75 mg via ORAL
  Filled 2022-10-07 (×9): qty 1

## 2022-10-07 MED ORDER — CITALOPRAM HYDROBROMIDE 20 MG PO TABS: 10.0000 mg | ORAL_TABLET | Freq: Every morning | ORAL | Status: DC

## 2022-10-07 MED ORDER — ACYCLOVIR 200 MG PO CAPS
400.0000 mg | ORAL_CAPSULE | Freq: Every day | ORAL | Status: DC
  Administered 2022-10-07 – 2022-10-15 (×8): 400 mg via ORAL
  Filled 2022-10-07 (×9): qty 2

## 2022-10-07 MED ORDER — RISPERIDONE 0.25 MG PO TABS
0.2500 mg | ORAL_TABLET | Freq: Two times a day (BID) | ORAL | Status: DC
  Administered 2022-10-07 – 2022-10-15 (×14): 0.25 mg via ORAL
  Filled 2022-10-07 (×17): qty 1

## 2022-10-07 MED ORDER — ALPRAZOLAM 0.5 MG PO TABS
0.5000 mg | ORAL_TABLET | Freq: Every day | ORAL | Status: DC | PRN
  Administered 2022-10-09 – 2022-10-12 (×3): 0.5 mg via ORAL
  Administered 2022-10-12: 0.25 mg via ORAL
  Administered 2022-10-13 – 2022-10-14 (×2): 0.5 mg via ORAL
  Filled 2022-10-07 (×7): qty 1

## 2022-10-07 NOTE — ED Triage Notes (Signed)
Patient belongings:  Pair white sneakers 2 yellow metal rings and a watch Pair of leggings Long sleeve top, blue Blue jacket White bra White underware

## 2022-10-07 NOTE — BH Assessment (Signed)
Comprehensive Clinical Assessment (CCA) Screening, Triage and Referral Note  10/07/2022 Audrey Duran [id number]  Tod Persia, 88 year old female who presents to [facility] ED involuntarily for treatment. Per triage note, Arrives with [employer] under IVC paperwork for ED evaluation. Per IVC paperwork patient states that there are 30 children living in her home, she lives alone.  Patient has been calling [facility] [employer] daily asking them to get the children out of her house.  Today, the call was blocked so the patient presented to the [facility] in person stating that she did not want the kids in her house anymore. Daughter reports patient has not been sleeping and losing weight, and not taking medications as prescribed. Per law enforcement, patient has not been sleeping and sister has been giving her Xanax. Patient is Awake, alert, oriented.  Patient unable to tell me the year or the month.  Oriented to current president and date. Denies SI/HI. Patient tearful in triage while dressing her out.  States "I'm scared.  What did I do wrong?".   During TTS assessment pt presents alert and oriented x 4, restless but cooperative, and mood-congruent with affect. The pt does not appear to be responding to internal or external stimuli. Neither is the pt presenting with any delusional thinking. Pt verified the information provided to triage RN.   Pt identifies her main complaint to be that she is not sure why she was brought to the ED. Patient is tearful and wanting to go home. Patient presents with delusions stating kids were at her home. Patient admits to driving to [facility]; however, the "kids" were not there when she arrived. 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 Patient is alert and cooperative. She states she lives alone. Patient reports she eats "pretty good" and has already had 2 meals today. Patient reports she is not currently taking any medications. Pt denies SI/HI/AH/VH.    Writer spoke to patient's daughter, Joseph Art for  collateral [phone]. Renee reports patient is non-compliant with her medications and she needs help in finding an inpatient facility that will keep patient until she is well. Renee reports patient was seen at [facility] in 2021 for similar symptoms and released after 14 days. Daughter states that she will not come to pick up patient if she is still "loopy". Renee states she has tried contacting patient's doctor to complete forms for proper housing (ALF) but doctor has not returned her calls and she does not know what else to do.    Per Christal, NP,pt is recommended for overnight observation to be reassessed in the morning.    Chief Complaint: No chief complaint on file.  Visit Diagnosis: Visual hallucinations  Patient Reported Information How did you hear about [place]? No data recorded What Is the Reason for Your Visit/Call Today? Patient is not sure why she was brought to the ED. Per IVC paperwork, patient has delusions of children in her home.  How Long Has This Been Causing You Problems? 1-6 months  What Do You Feel Would Help You the Most Today? -- (Assessment only)   Have You Recently Had Any Thoughts About Hurting Yourself? No  Are You Planning to Commit Suicide/Harm Yourself At This time? No   Have you Recently Had Thoughts About [place]? No  Are You Planning to Harm Someone at This Time? No  Explanation: No data recorded  Have You Used Any Alcohol or Drugs in the Past 24 Hours? No  How Long Ago Did You  Use Drugs or Alcohol? No data recorded What Did You Use and How Much? No data recorded  Do You Currently Have a Therapist/Psychiatrist? No  Name of Therapist/Psychiatrist: No data recorded  Have You Been Recently Discharged From Any Office Practice or Programs? No  Explanation of Discharge From Practice/Program: No data recorded   CCA Screening Triage Referral Assessment Type of Contact: Face-to-Face  Telemedicine Service Delivery:   Is this  Initial or Reassessment?   Date Telepsych consult ordered in [facility]:    Time Telepsych consult ordered in [facility]:    Location of Assessment: [facility] ED  Provider Location: [facility] ED    Collateral Involvement: Maia Breslow   Does Patient Have a [place]? No data recorded Name and Contact of Legal Guardian: No data recorded If Minor and Not Living with Parent(s), Who has Custody? No data recorded Is CPS involved or ever been involved? No data recorded Is APS involved or ever been involved? No data recorded  Patient Determined To Be At Risk for Harm To Self or Others Based on Review of Patient Reported Information or Presenting Complaint? No data recorded Method: No data recorded Availability of Means: No data recorded Intent: No data recorded Notification Required: No data recorded Additional Information for Danger to Others Potential: No data recorded Additional Comments for Danger to Others Potential: No data recorded Are There Guns or Other Weapons in Your Home? No data recorded Types of Guns/Weapons: No data recorded Are These Weapons Safely Secured?                            No data recorded Who Could Verify You Are Able To Have These Secured: No data recorded Do You Have any Outstanding Charges, Pending Court Dates, Parole/Probation? No data recorded Contacted To Inform of Risk of Harm To Self or Others: No data recorded  Does Patient Present under Involuntary Commitment? Yes    [place] of Residence: [place]   Patient Currently Receiving the Following Services: Medication Management   Determination of Need: Emergent (2 hours)   Options For Referral: ED Visit; Medication Management; ALF/SNF   Discharge Disposition:     Eula Fried, Counselor, LCAS-A

## 2022-10-07 NOTE — ED Notes (Signed)
Assumed care of patient. Patient was assisted to the bathroom. Patient is alert, pleasant, and cooperative. Patient does not endorse SI or HI at this time.

## 2022-10-07 NOTE — ED Triage Notes (Addendum)
Arrives with [employer] under IVC paperwork for ED evaluation. Per IVC paperwork patietn states that there are 30 children living in her home, she lives alone.  [facility] has been [facility] [employer] [facility] asking them to get the children out of her house.  Today, the call was blocked so the patient presented to the [facility] in person stating that she did not want the kids in her house anymore. Daughter reports patient has not been sleeping and losing weight and not taking medications as prescribed.  Per law enforcement, patient has not been sleeping and sister has been giving her xanax.  Patient is Awake, alert, oriented.  Patient unable to tell me the year or the month.  Oriented to current president and date.  Denies SI/HI  Patient tearful in triage while dressing her out.  States "I'm scared.  What did I do wrong?".

## 2022-10-07 NOTE — ED Notes (Signed)
Pt given dinner tray. Pt states she does not want it. Pt reports she didn't think she would be here this long and she is homesick. Pt tearful and sates she just wants to go home. Pt encouraged to eat and reassured. Pt calmed and allowed this nurse to provide her dinner tray and cup of water.

## 2022-10-07 NOTE — ED Notes (Signed)
Hospital meal tray and drink provided to pt. Meal halfway consumed and disposed of properly, per pts request.

## 2022-10-07 NOTE — Consult Note (Signed)
[facility] Consult   Reason for Consult:  Psychiatric Evaluation  Referring Physician:  Dr. Jari Pigg Patient Identification: Audrey Duran MRN:  [id number] Principal Diagnosis: Episodes of formed visual hallucinations Diagnosis:  Principal Problem:   Episodes of formed visual hallucinations Active Problems:   Anxiety state   Delusional disorder (HCC)   Altered mental status   Decreased appetite   Total Time spent with patient: 1 hour  Subjective:  "I don't know why I'm here".  Audrey Duran is a 88 y.o. female patient with visual hallucinations and altered mental status.  HPI: Per Dr. Jari Pigg, Audrey Duran is a 88 y.o. female who comes in under IVC.  Patient reportedly stated that she has 3 children living in her home and reportedly [facility] [employer] asking [facility] to take the children out of her home.  She also went to the [facility] when he blocked her call patient report has not been sleeping sister has been giving her Xanax.  Patient herself denies any obvious falls and states she will occasionally block her head.  She denies any chest pain shortness of breath abdominal pain or any other concerns.  She states "just want to go home".  Reports not being sure why she is here. I reviewed note from Dr. Nicki Reaper does have a history of dementia with delusional disorder and she has had prior psychiatric hospitalization in [place] for the same back in 2022.  She has not been taking her medications however when she saw the patient on 3/8 she did not seem to be a danger to herself or for she was not IVC.  I do see that she has a prescription for acyclovir but that sounds like this just for suppression for recurrent HSV.  Patient seen and chart reviewed. Patient is alert and oriented to month, year and city. She is pleasantly confused, calm, and cooperative. She is tearful intermittently throughout the evaluation and states, "I want to go home". She appears to be [facility]. She is able to  answer simple questions appropriately. Patient reports that she is not able to answer certain assessment questions stating, "my head  not clear yet". Patient admits to driving to the [facility] today due to being "concerned about the children". 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 She appears to enjoy reminiscing and shares a memory about seeing and receiving Christmas gifts from former students. She denies suicidal or homicidal ideations. She endorses visual hallucinations of seeing children in the home this morning. She denies auditory hallucinations. She does not appear to be responding to internal stimuli. When asked about her appetite she states, "my daughter said I'm losing too much weight". When asked how she is sleeping at night she appears to exhibit some derailment of thought. UDS positive for Benzodiazepine; patient has an active Rx for alprazolam as needed.    Per Demetria Be., Counselor, Writer spoke to patient's daughter, Joseph Art for collateral [phone]. Renee reports patient is non-compliant with her medications and she needs help in finding an inpatient facility that will keep patient until she is well. Renee reports patient was seen at [facility] in 2021 for similar symptoms and released after 14 days. Daughter states that she will not come to pick up patient if she is still "loopy". Renee states she has tried contacting patient's doctor to complete forms for proper housing (ALF) but doctor has not returned her calls and she does not know what else to do.  Past Psychiatric History: Per chart review patient has a history of Postpartum depression, anxiety, paranoid delusional disorder, visual hallucinations. Inpatient hospitalization at [facility] 06/21 for psychosis.    Risk to Self:  No  Risk to Others:  No Prior Inpatient Therapy:  Yes  Prior Outpatient Therapy:  Yes   Past Medical History:  Past Medical  History:  Diagnosis Date   Anemia    Atrial fibrillation ([place])    COPD (chronic obstructive pulmonary disease) ([place])    Coronary artery disease 1995   s/p AMI , no history of stents,  [name]   Hyperlipidemia    Ischemic cardiomyopathy Dec 2011   ETT Sestamibi study apical scar, no ischemia, [name]   Myocardial infarction ([place])    Recurrent HSV (herpes simplex virus)     Past Surgical History:  Procedure Laterality Date   ABDOMINAL HYSTERECTOMY  june 2014   BREAST EXCISIONAL BIOPSY Left 1990's   neg   CAROTID ENDARTERECTOMY  July 2007   Dew, right carotid   CATARACT EXTRACTION  2004, 2006   JOINT REPLACEMENT  July 2013   total hip , [place]   LAPAROSCOPIC APPENDECTOMY N/A 02/20/2020   Procedure: APPENDECTOMY LAPAROSCOPIC;  Surgeon: Jules Husbands, MD;  Location: [facility] ORS;  Service: General;  Laterality: N/A;   Family History:  Family History  Problem Relation Age of Onset   Cancer Mother 58       Pancreatic    Heart disease Father    Cancer Sister 52       Breast Cancer   Breast cancer Sister 1   Multiple sclerosis Daughter    Family Psychiatric  History: Unknown Social History:  Social History   Substance and Sexual Activity  Alcohol Use No   Alcohol/week: 0.0 standard drinks of alcohol     Social History   Substance and Sexual Activity  Drug Use No    Social History   Socioeconomic History   Marital status: Divorced    Spouse name: Not on file   Number of children: Not on file   Years of education: Not on file   Highest education level: Not on file  Occupational History   Not on file  Tobacco Use   Smoking status: Former    Types: Cigarettes    Quit date: 01/13/1992    Years since quitting: 30.7   Smokeless tobacco: Never  Substance and Sexual Activity   Alcohol use: No    Alcohol/week: 0.0 standard drinks of alcohol   Drug use: No   Sexual activity: Not Currently  Other Topics Concern   Not on file  Social History Narrative   Not on file    Social Determinants of Health   Financial Resource Strain: Low Risk  (12/03/2020)   Overall Financial Resource Strain (CARDIA)    Difficulty of Paying Living Expenses: Not hard at all  Food Insecurity: No Food Insecurity (12/03/2020)   Hunger Vital Sign    Worried About Running Out of Food in the Last Year: Never Audrey    [place] in the Last Year: Never Audrey  Transportation Needs: No Transportation Needs (12/03/2020)   PRAPARE - Hydrologist (Medical): No    Lack of Transportation (Non-Medical): No  Physical Activity: Not on file  Stress: No Stress Concern Present (12/03/2020)   [place]    Feeling of Stress : Not at all  Social Connections:  Unknown (12/03/2020)   Social Connection and Isolation Panel [NHANES]    Frequency of Communication with Friends and Family: More than three times a week    Frequency of Social Gatherings with Friends and Family: More than three times a week    Attends Religious Services: Not on Advertising copywriter or Organizations: Not on file    Attends Archivist Meetings: Not on file    Marital Status: Not on file   Additional Social History:    Allergies:   Allergies  Allergen Reactions   Azithromycin     GI upset   Clarithromycin     ? Rash   Diclofenac     ? Rash   Lisinopril Other (See Comments)    hyperkalemia   Meloxicam     ? Rash   Nitrofurantoin Monohyd Macro     ? rash   Zocor [Simvastatin]    Penicillins Rash    Labs:  Results for orders placed or performed during the hospital encounter of 10/07/22 (from the past 48 hour(s))  Comprehensive metabolic panel     Status: Abnormal   Collection Time: 10/07/22 12:46 PM  Result Value Ref Range   Sodium 138 135 - 145 mmol/L   Potassium 4.2 3.5 - 5.1 mmol/L   Chloride 103 98 - 111 mmol/L   CO2 25 22 - 32 mmol/L   Glucose, Bld 113 (H) 70 - 99 mg/dL    Comment:  Glucose reference range applies only to samples taken after fasting for at least 8 hours.   BUN 27 (H) 8 - 23 mg/dL   Creatinine, Ser 1.03 (H) 0.44 - 1.00 mg/dL   Calcium 10.1 8.9 - 10.3 mg/dL   Total Protein 7.0 6.5 - 8.1 g/dL   Albumin 3.7 3.5 - 5.0 g/dL   AST 20 15 - 41 U/L   ALT 15 0 - 44 U/L   Alkaline Phosphatase 55 38 - 126 U/L   Total Bilirubin 0.9 0.3 - 1.2 mg/dL   GFR, Estimated 53 (L) >60 mL/min    Comment: (NOTE) Calculated using the CKD-EPI Creatinine Equation (2021)    Anion gap 10 5 - 15    Comment: Performed at [facility], [address] [place], [place] [address]  Ethanol     Status: None   Collection Time: 10/07/22 12:46 PM  Result Value Ref Range   Alcohol, Ethyl (B) <10 <10 mg/dL    Comment: (NOTE) Lowest detectable limit for serum alcohol is 10 mg/dL.  For medical purposes only. Performed at [facility], [address] [place], [place] [id number]   Salicylate level     Status: Abnormal   Collection Time: 10/07/22 12:46 PM  Result Value Ref Range   Salicylate Lvl Q000111Q (L) 7.0 - 30.0 mg/dL    Comment: Performed at [facility], [address] [place], [place] [address]  Acetaminophen level     Status: Abnormal   Collection Time: 10/07/22 12:46 PM  Result Value Ref Range   Acetaminophen (Tylenol), Serum <10 (L) 10 - 30 ug/mL    Comment: (NOTE) Therapeutic concentrations vary significantly. A range of 10-30 ug/mL  may be an effective concentration for many patients. However, some  are best treated at concentrations outside of this range. Acetaminophen concentrations >150 ug/mL at 4 hours after ingestion  and >50 ug/mL at 12 hours after ingestion are often associated with  toxic reactions.  Performed at [facility], [address] [place], [place] [address]   cbc     Status: None   Collection Time: 10/07/22 12:46 PM  Result Value Ref Range   WBC 7.1 4.0 - 10.5 K/uL   RBC 4.15 3.87 - 5.11 MIL/uL    Hemoglobin 12.5 12.0 - 15.0 g/dL   HCT 40.1 36.0 - 46.0 %   MCV 96.6 80.0 - 100.0 fL   MCH 30.1 26.0 - 34.0 pg   MCHC 31.2 30.0 - 36.0 g/dL   RDW 12.5 11.5 - 15.5 %   Platelets 162 150 - 400 K/uL   nRBC 0.0 0.0 - 0.2 %    Comment: Performed at [facility], [address], [place], [place] [address]  Urine Drug Screen, Qualitative     Status: Abnormal   Collection Time: 10/07/22  1:47 PM  Result Value Ref Range   Tricyclic, Ur Screen NONE DETECTED NONE DETECTED   Amphetamines, Ur Screen NONE DETECTED NONE DETECTED   MDMA (Ecstasy)Ur Screen NONE DETECTED NONE DETECTED   Cocaine Metabolite,Ur [place] NONE DETECTED NONE DETECTED   Opiate, Ur Screen NONE DETECTED NONE DETECTED   Phencyclidine (PCP) Ur S NONE DETECTED NONE DETECTED   Cannabinoid 50 Ng, Ur [place] NONE DETECTED NONE DETECTED   Barbiturates, Ur Screen NONE DETECTED NONE DETECTED   Benzodiazepine, Ur Scrn POSITIVE (A) NONE DETECTED   Methadone Scn, Ur NONE DETECTED NONE DETECTED    Comment: (NOTE) Tricyclics + metabolites, urine    Cutoff 1000 ng/mL Amphetamines + metabolites, urine  Cutoff 1000 ng/mL MDMA (Ecstasy), urine              Cutoff 500 ng/mL Cocaine Metabolite, urine          Cutoff 300 ng/mL Opiate + metabolites, urine        Cutoff 300 ng/mL Phencyclidine (PCP), urine         Cutoff 25 ng/mL Cannabinoid, urine                 Cutoff 50 ng/mL Barbiturates + metabolites, urine  Cutoff 200 ng/mL Benzodiazepine, urine              Cutoff 200 ng/mL Methadone, urine                   Cutoff 300 ng/mL  The urine drug screen provides only a preliminary, unconfirmed analytical test result and should not be used for non-medical purposes. Clinical consideration and professional judgment should be applied to any positive drug screen result due to possible interfering substances. A more specific alternate chemical method must be used in order to obtain a confirmed analytical result. Gas chromatography / mass  spectrometry (GC/MS) is the preferred confirm atory method. Performed at [facility], [address] [place], [place] [address]   Urinalysis, Routine w reflex microscopic -Urine, Clean Catch     Status: Abnormal   Collection Time: 10/07/22  1:47 PM  Result Value Ref Range   Color, Urine YELLOW (A) YELLOW   APPearance CLEAR (A) CLEAR   Specific Gravity, Urine 1.006 1.005 - 1.030   pH 6.0 5.0 - 8.0   Glucose, UA NEGATIVE NEGATIVE mg/dL   Hgb urine dipstick NEGATIVE NEGATIVE   Bilirubin Urine NEGATIVE NEGATIVE   Ketones, ur NEGATIVE NEGATIVE mg/dL   Protein, ur NEGATIVE NEGATIVE mg/dL   Nitrite NEGATIVE NEGATIVE   Leukocytes,Ua NEGATIVE NEGATIVE    Comment: Performed at [facility], [address], [place], [place] [address]    Current Facility-Administered  Medications  Medication Dose Route Frequency Provider Last Rate Last Admin   acyclovir (ZOVIRAX) 200 MG capsule 400 mg  400 mg Oral Daily Vanessa [place], MD   400 mg at 10/07/22 1802   ALPRAZolam Duanne Moron) tablet 0.5 mg  0.5 mg Oral Daily PRN Sole Lengacher H, NP       irbesartan (AVAPRO) tablet 75 mg  75 mg Oral Daily Vanessa [place], MD   75 mg at 10/07/22 1802   mirtazapine (REMERON) tablet 7.5 mg  7.5 mg Oral QHS Odena Mcquaid H, NP       risperiDONE (RISPERDAL) tablet 0.25 mg  0.25 mg Oral BID Shruti Arrey H, NP       Current Outpatient Medications  Medication Sig Dispense Refill   acyclovir (ZOVIRAX) 400 MG tablet Take 1 tablet (400 mg total) by mouth daily. 90 tablet 1   alendronate (FOSAMAX) 70 MG tablet Take 1 tablet by mouth every 7 days on an empty stomach with a full glass of water 12 tablet 2   ALPRAZolam (XANAX) 0.5 MG tablet Take 0.5 mg by mouth daily as needed.     aspirin 81 MG tablet Take 81 mg by mouth at bedtime.     Cholecalciferol (VITAMIN D3) 75 MCG (3000 UT) TABS Take 1 tablet by mouth daily. Take one by mouth daily 90 tablet 3   cyanocobalamin 1000 MCG tablet Take one by  mouth daily     rosuvastatin (CRESTOR) 20 MG tablet Take 1 tablet (20 mg total) by mouth daily. 90 tablet 3   telmisartan (MICARDIS) 20 MG tablet Take 1 tablet (20 mg total) by mouth daily. 90 tablet 1   timolol (TIMOPTIC) 0.5 % ophthalmic solution Place 1 drop into both eyes 2 (two) times daily.      citalopram (CELEXA) 10 MG tablet Take by mouth every morning. (Patient not taking: Reported on 10/02/2022)     magnesium oxide (MAG-OX) 400 MG tablet Take 400 mg by mouth daily. (Patient not taking: Reported on 10/02/2022)     risperiDONE (RISPERDAL) 0.25 MG tablet Take 1 tablet (0.25 mg total) by mouth at bedtime. (Patient not taking: Reported on 10/02/2022) 30 tablet 2    Musculoskeletal: Strength & Muscle Tone: within normal limits Gait & Station:  Did not observe Patient leans: N/A  Psychiatric Specialty Exam: Physical Exam Vitals and nursing note reviewed.  HENT:     Head: Normocephalic.     Nose: Nose normal.  Pulmonary:     Effort: Pulmonary effort is normal.  Musculoskeletal:     Cervical back: Normal range of motion.  Neurological:     Mental Status: She is alert. She is disoriented.  Psychiatric:        Attention and Perception: She perceives visual hallucinations.        Mood and Affect: Affect is tearful.        Speech: Speech is tangential.        Behavior: Behavior is cooperative.        Thought Content: Thought content is delusional. Thought content does not include homicidal or suicidal ideation.        Cognition and Memory: Cognition is impaired. Memory is impaired.        Judgment: Judgment is inappropriate.     Review of Systems  Blood pressure (!) 162/49, pulse 60, temperature 97.9 F (36.6 C), temperature source Oral, resp. rate 16, height '5\' 3"'$  (1.6 m), weight 49.5 kg, SpO2 100 %.Body mass index is 19.33 kg/m.  General Appearance: Casual  Eye Contact:  Good  Speech:  Clear and Coherent  Volume:  Normal  Mood:  Euthymic  Affect:  Congruent  Thought Process:   Descriptions of Associations: Loose  Orientation:  Other:  Oriented to month, year, city  Thought Content:  Hallucinations: Visual Seeing children in the home   Suicidal Thoughts:  No  Homicidal Thoughts:  No  Memory:  Immediate;   Poor  Judgement:  Impaired  Insight:  Lacking  Psychomotor Activity:  Normal  Concentration:  Concentration: Fair  Recall:  Poor  Fund of Knowledge:  Poor  Language:  Fair  Akathisia:  No  Handed:  Right  AIMS (if indicated):     Assets:  Financial Resources/Insurance  ADL's:  Intact  Cognition:  Impaired,  Severe  Sleep:          Physical Exam: Physical Exam Vitals and nursing note reviewed.  HENT:     Head: Normocephalic.     Nose: Nose normal.  Pulmonary:     Effort: Pulmonary effort is normal.  Musculoskeletal:     Cervical back: Normal range of motion.  Neurological:     Mental Status: She is alert. She is disoriented.  Psychiatric:        Attention and Perception: She perceives visual hallucinations.        Mood and Affect: Affect is tearful.        Speech: Speech is tangential.        Behavior: Behavior is cooperative.        Thought Content: Thought content is delusional. Thought content does not include homicidal or suicidal ideation.        Cognition and Memory: Cognition is impaired. Memory is impaired.        Judgment: Judgment is inappropriate.    ROS Blood pressure (!) 162/49, pulse 60, temperature 97.9 F (36.6 C), temperature source Oral, resp. rate 16, height '5\' 3"'$  (1.6 m), weight 49.5 kg, SpO2 100 %. Body mass index is 19.33 kg/m.  Treatment Plan Summary: The patient will remain under psychiatric observation overnight and be reassessed in the morning. Medication management: Will restart home medications Celexa 10 mg po daily, alprazolam 0.5 mg daily as needed, Risperidone 0.25 mg po twice a day. Will add low dose mirtazapine 7.5 mg po at bedtime to aid in appetite stimulation, also beneficial of sleep support. TOC  consult placed due to APS involvement.    Disposition: The patient will remain under psychiatric observation overnight and be reassessed in the morning.  Ronny Flurry, NP 10/07/2022 11:19 PM

## 2022-10-07 NOTE — ED Provider Notes (Addendum)
[facility] Provider Note    Event Date/Time   First MD Initiated Contact with Patient 10/07/22 1309     (approximate)   History   No chief complaint on file.   HPI  Audrey Duran is a 88 y.o. female who comes in under IVC.  Patient reportedly stated that she has 3 children living in her home and [facility] [employer] [facility] to take the children out of her home.  She also went to the [facility] when he blocked her call patient report has not been sleeping sister has been giving her Xanax.  Patient herself denies any obvious falls and states she will occasionally block her head.  She denies any chest pain shortness of breath abdominal pain or any other concerns.  She states "just want to go home".  Reports not being sure why she is here.  I reviewed note from Dr. Nicki Reaper does have a history of dementia with delusional disorder and she has had prior psychiatric hospitalization in [place] for the same back in 2022.  She has not been taking her medications however when she saw the patient on 3/8 she did not seem to be a danger to herself or for she was not IVC.  I do see that she has a prescription for acyclovir but that sounds like this just for suppression for recurrent HSV.  Physical Exam   Triage Vital Signs: ED Triage Vitals  Enc Vitals Group     BP 10/07/22 1243 (!) 157/74     Pulse Rate 10/07/22 1243 62     Resp 10/07/22 1243 16     Temp 10/07/22 1243 (!) 97.5 F (36.4 C)     Temp Source 10/07/22 1243 Oral     SpO2 10/07/22 1243 97 %     Weight 10/07/22 1244 109 lb 2 oz (49.5 kg)     Height 10/07/22 1244 '5\' 3"'$  (1.6 m)     Head Circumference --      Peak Flow --      Pain Score 10/07/22 1244 0     Pain Loc --      Pain Edu? --      Excl. in [place]? --     Most recent vital signs: Vitals:   10/07/22 1243  BP: (!) 157/74  Pulse: 62  Resp: 16  Temp: (!) 97.5 F (36.4 C)  SpO2: 97%     General: Awake, no distress.  CV:  Good  peripheral perfusion.  Resp:  Normal effort.  Abd:  No distention.  Other:  Move all around, pt asking to go home.   ED Results / Procedures / Treatments   Labs (all labs ordered are listed, but only abnormal results are displayed) Labs Reviewed  URINE CULTURE  CBC  COMPREHENSIVE METABOLIC PANEL  ETHANOL  SALICYLATE LEVEL  ACETAMINOPHEN LEVEL  URINE DRUG SCREEN, QUALITATIVE ([place])  URINALYSIS, ROUTINE W REFLEX MICROSCOPIC    RADIOLOGY I have reviewed the CT imaging personally interpreted and no evidence of intercranial hemorrhage   PROCEDURES:  Critical Care performed: No  Procedures   MEDICATIONS ORDERED IN ED: Medications - No data to display   IMPRESSION / MDM / [place] / ED COURSE  I reviewed the triage vital signs and the nursing notes.   Patient's presentation is most consistent with acute presentation with potential threat to life or bodily function.   Pt is without any acute medical complaints. No exam findings to suggest medical  cause of current presentation. Will order psychiatric screening labs and discuss further w/ psychiatric service.  Given I do not see that she has really been seen for these kinds of things previously we will get a CT head just to ensure no evidence of intercranial hemorrhage or brain mass.  UA without evidence of UTI.  CT PE slightly elevated creatinine but similar to baseline.  Salicylate, Tylenol neg. CBC is normal.  D/d includes but is not limited to psychiatric disease, behavioral/personality disorder, inadequate socioeconomic support, medical.  Based on HPI, exam, unremarkable labs, no concern for acute medical problem at this time. No rigidity, clonus, hyperthermia, focal neurologic deficit, diaphoresis, tachycardia, meningismus, ataxia, gait abnormality or other finding to suggest this visit represents a non-psychiatric problem. Screening labs reviewed.    Given this, pt medically cleared, to be dispositioned  per Psych.    The patient has been placed in psychiatric observation due to the need to provide a safe environment for the patient while obtaining psychiatric consultation and evaluation, as well as ongoing medical and medication management to treat the patient's condition.  The patient has been placed under full IVC at this time.    The patient is on the cardiac monitor to evaluate for evidence of arrhythmia and/or significant heart rate changes.      FINAL CLINICAL IMPRESSION(S) / ED DIAGNOSES   Final diagnoses:  None     Rx / DC Orders   ED Discharge Orders     None        Note:  This document was prepared using Dragon voice recognition software and may include unintentional dictation errors.   Vanessa [place], MD 10/07/22 1556    Vanessa [place], MD 10/07/22 [phone]

## 2022-10-07 NOTE — ED Notes (Signed)
This RN checked on the pt's well being, pt is seated on the side of the bed wringing her hands. When pt is asked how she is doing she states not good. Pt states that she wants to go home, and repeatedly states this. Pt asked by this RN what year it is and what month it is and the pt states that she is so mixed up she doesn't even know, then states, "aren't ya'll taking this a little too far?"

## 2022-10-07 NOTE — ED Notes (Signed)
IVC PENDING  CONSULT ?

## 2022-10-08 DIAGNOSIS — R441 Visual hallucinations: Secondary | ICD-10-CM

## 2022-10-08 LAB — URINE CULTURE: Culture: <10000 — AB

## 2022-10-08 MED ORDER — ACETAMINOPHEN 325 MG PO TABS
650.0000 mg | ORAL_TABLET | Freq: Once | ORAL | Status: AC
  Administered 2022-10-08: 650 mg via ORAL
  Filled 2022-10-08: qty 2

## 2022-10-08 MED ORDER — TIMOLOL MALEATE 0.5 % OP SOLN
1.0000 [drp] | Freq: Two times a day (BID) | OPHTHALMIC | Status: DC
  Administered 2022-10-10 – 2022-10-15 (×10): 1 [drp] via OPHTHALMIC
  Filled 2022-10-08: qty 5

## 2022-10-08 NOTE — ED Notes (Signed)
Patient given Tylenol for back pain.

## 2022-10-08 NOTE — ED Notes (Signed)
Hospital meal provided, pt tolerated w/o complaints.  Waste discarded appropriately.  

## 2022-10-08 NOTE — Consult Note (Addendum)
[name]   Reason for Consult:  Psychiatric Evaluation    REASSESSMENT  Referring Physician:  Dr. Jari Pigg Patient Identification: Audrey Duran MRN:  [id number] Principal Diagnosis: Episodes of formed visual hallucinations Diagnosis:  Principal Problem:   Episodes of formed visual hallucinations Active Problems:   Anxiety state   Delusional disorder ([place])   Altered mental status   Decreased appetite   Total Time spent with patient: 45 minutes  Subjective: "When can I go home".    Audrey Duran is a 88 y.o. female patient with visual hallucinations and altered mental status.  On evaluation today, the patient is alert and oriented to month, city and state. She is pleasant, calm and cooperative. She is able to answer questions appropriately with some intermittent confusion noted throughout the assessment. Today, she does not recall driving to the [facility] yesterday and states, "if I went there, I guess I was going to exercise". When questioned about 'the children' the patient reports she thought about the children last night, but denies seeing them. She denies suicidal or homicidal ideation. She denies auditory or visual hallucination. She does not appear to be responding to internal stimuli. She has been compliant with routine psychotropic medication and has not exhibited any negative behavior during hospitalization.   Writer spoke to patient's daughter, Audrey Duran for collateral [phone]. Renee reports she has been diligently seeking a nursing facility due to her mother's memory, and has been unsuccessful up to this point. She reports her mother often forgets to eat and does not take her medication and she is afraid for her to continue living alone. The daughter reports she met with patient's PCP last Friday to inform them she needed assistance with getting patient placed into a nursing facility. She reports the patient was admitted to [facility] in June 2021. She states, "It's not going to help sending her back to [facility] for 14 days, because she's going to be back in the same boat". The daughter reports she currently lives over an hour away, but her aunt (patient's sister) who is in her late 41 resides locally and attempts to assist the patient with her needs.     Past Psychiatric History: Per chart review patient has a history of Postpartum depression, anxiety, paranoid delusional disorder, visual hallucinations. Inpatient hospitalization at [facility] June 2021 for   Risk to Self:  No  Risk to Others:  No Prior Inpatient Therapy:  Yes  Prior Outpatient Therapy:  Yes   Past Medical History:  Past Medical History:  Diagnosis Date   Anemia    Atrial fibrillation ([place])    COPD (chronic obstructive pulmonary disease) ([place])    Coronary artery disease 1995   s/p AMI , no history of stents,  [name]   Hyperlipidemia    Ischemic cardiomyopathy Dec 2011   ETT Sestamibi study apical scar, no ischemia, [name]   Myocardial infarction ([place])    Recurrent HSV (herpes simplex virus)     Past Surgical History:  Procedure Laterality Date   ABDOMINAL HYSTERECTOMY  june 2014   BREAST EXCISIONAL BIOPSY Left 1990's   neg   CAROTID ENDARTERECTOMY  July 2007   Dew, right carotid   CATARACT EXTRACTION  2004, 2006   JOINT REPLACEMENT  July 2013   total hip , [place]   LAPAROSCOPIC APPENDECTOMY N/A 02/20/2020   Procedure: APPENDECTOMY LAPAROSCOPIC;  Surgeon: Jules Husbands, MD;  Location: [facility] ORS;  Service: General;  Laterality: N/A;  Family History:  Family History  Problem Relation Age of Onset   Cancer Mother 13       Pancreatic    Heart disease Father    Cancer Sister 76       Breast Cancer   Breast cancer Sister 24   Multiple sclerosis Daughter    Family Psychiatric  History: Unknown Social History:  Social History   Substance and Sexual Activity  Alcohol Use No   Alcohol/week:  0.0 standard drinks of alcohol     Social History   Substance and Sexual Activity  Drug Use No    Social History   Socioeconomic History   Marital status: Divorced    Spouse name: Not on file   Number of children: Not on file   Years of education: Not on file   Highest education level: Not on file  Occupational History   Not on file  Tobacco Use   Smoking status: Former    Types: Cigarettes    Quit date: 01/13/1992    Years since quitting: 30.7   Smokeless tobacco: Never  Substance and Sexual Activity   Alcohol use: No    Alcohol/week: 0.0 standard drinks of alcohol   Drug use: No   Sexual activity: Not Currently  Other Topics Concern   Not on file  Social History Narrative   Not on file   Social Determinants of Health   Financial Resource Strain: Low Risk  (12/03/2020)   Overall Financial Resource Strain (CARDIA)    Difficulty of Paying Living Expenses: Not hard at all  Food Insecurity: No Food Insecurity (12/03/2020)   Hunger Vital Sign    Worried About Running Out of Food in the Last Year: Never true    [place] in the Last Year: Never true  Transportation Needs: No Transportation Needs (12/03/2020)   PRAPARE - Hydrologist (Medical): No    Lack of Transportation (Non-Medical): No  Physical Activity: Not on file  Stress: No Stress Concern Present (12/03/2020)   [place]    Feeling of Stress : Not at all  Social Connections: Unknown (12/03/2020)   Social Connection and Isolation Panel [NHANES]    Frequency of Communication with Friends and Family: More than three times a week    Frequency of Social Gatherings with Friends and Family: More than three times a week    Attends Religious Services: Not on file    Active Member of Clubs or Organizations: Not on file    Attends Archivist Meetings: Not on file    Marital Status: Not on file   Additional  Social History:    Allergies:   Allergies  Allergen Reactions   Azithromycin     GI upset   Clarithromycin     ? Rash   Diclofenac     ? Rash   Lisinopril Other (See Comments)    hyperkalemia   Meloxicam     ? Rash   Nitrofurantoin Monohyd Macro     ? rash   Zocor [Simvastatin]    Penicillins Rash    Labs:  Results for orders placed or performed during the hospital encounter of 10/07/22 (from the past 48 hour(s))  Comprehensive metabolic panel     Status: Abnormal   Collection Time: 10/07/22 12:46 PM  Result Value Ref Range   Sodium 138 135 - 145 mmol/L   Potassium 4.2 3.5 - 5.1 mmol/L  Chloride 103 98 - 111 mmol/L   CO2 25 22 - 32 mmol/L   Glucose, Bld 113 (H) 70 - 99 mg/dL    Comment: Glucose reference range applies only to samples taken after fasting for at least 8 hours.   BUN 27 (H) 8 - 23 mg/dL   Creatinine, Ser 1.03 (H) 0.44 - 1.00 mg/dL   Calcium 10.1 8.9 - 10.3 mg/dL   Total Protein 7.0 6.5 - 8.1 g/dL   Albumin 3.7 3.5 - 5.0 g/dL   AST 20 15 - 41 U/L   ALT 15 0 - 44 U/L   Alkaline Phosphatase 55 38 - 126 U/L   Total Bilirubin 0.9 0.3 - 1.2 mg/dL   GFR, Estimated 53 (L) >60 mL/min    Comment: (NOTE) Calculated using the CKD-EPI Creatinine Equation (2021)    Anion gap 10 5 - 15    Comment: Performed at [facility], [address], [place], [place] [address]  Ethanol     Status: None   Collection Time: 10/07/22 12:46 PM  Result Value Ref Range   Alcohol, Ethyl (B) <10 <10 mg/dL    Comment: (NOTE) Lowest detectable limit for serum alcohol is 10 mg/dL.  For medical purposes only. Performed at [facility], [address] [place], [place] [id number]   Salicylate level     Status: Abnormal   Collection Time: 10/07/22 12:46 PM  Result Value Ref Range   Salicylate Lvl Q000111Q (L) 7.0 - 30.0 mg/dL    Comment: Performed at [facility], [address] [place], [place] [address]  Acetaminophen level     Status: Abnormal    Collection Time: 10/07/22 12:46 PM  Result Value Ref Range   Acetaminophen (Tylenol), Serum <10 (L) 10 - 30 ug/mL    Comment: (NOTE) Therapeutic concentrations vary significantly. A range of 10-30 ug/mL  may be an effective concentration for many patients. However, some  are best treated at concentrations outside of this range. Acetaminophen concentrations >150 ug/mL at 4 hours after ingestion  and >50 ug/mL at 12 hours after ingestion are often associated with  toxic reactions.  Performed at [facility], [address] [place], [place] [address]   cbc     Status: None   Collection Time: 10/07/22 12:46 PM  Result Value Ref Range   WBC 7.1 4.0 - 10.5 K/uL   RBC 4.15 3.87 - 5.11 MIL/uL   Hemoglobin 12.5 12.0 - 15.0 g/dL   HCT 40.1 36.0 - 46.0 %   MCV 96.6 80.0 - 100.0 fL   MCH 30.1 26.0 - 34.0 pg   MCHC 31.2 30.0 - 36.0 g/dL   RDW 12.5 11.5 - 15.5 %   Platelets 162 150 - 400 K/uL   nRBC 0.0 0.0 - 0.2 %    Comment: Performed at [facility], [address] [place], [place] [address]  Urine Drug Screen, Qualitative     Status: Abnormal   Collection Time: 10/07/22  1:47 PM  Result Value Ref Range   Tricyclic, Ur Screen NONE DETECTED NONE DETECTED   Amphetamines, Ur Screen NONE DETECTED NONE DETECTED   MDMA (Ecstasy)Ur Screen NONE DETECTED NONE DETECTED   Cocaine Metabolite,Ur [place] NONE DETECTED NONE DETECTED   Opiate, Ur Screen NONE DETECTED NONE DETECTED   Phencyclidine (PCP) Ur S NONE DETECTED NONE DETECTED   Cannabinoid 50 Ng, Ur [place] NONE DETECTED NONE DETECTED   Barbiturates, Ur Screen NONE DETECTED NONE DETECTED   Benzodiazepine, Ur Scrn  POSITIVE (A) NONE DETECTED   Methadone Scn, Ur NONE DETECTED NONE DETECTED    Comment: (NOTE) Tricyclics + metabolites, urine    Cutoff 1000 ng/mL Amphetamines + metabolites, urine  Cutoff 1000 ng/mL MDMA (Ecstasy), urine              Cutoff 500 ng/mL Cocaine Metabolite, urine          Cutoff 300 ng/mL Opiate +  metabolites, urine        Cutoff 300 ng/mL Phencyclidine (PCP), urine         Cutoff 25 ng/mL Cannabinoid, urine                 Cutoff 50 ng/mL Barbiturates + metabolites, urine  Cutoff 200 ng/mL Benzodiazepine, urine              Cutoff 200 ng/mL Methadone, urine                   Cutoff 300 ng/mL  The urine drug screen provides only a preliminary, unconfirmed analytical test result and should not be used for non-medical purposes. Clinical consideration and professional judgment should be applied to any positive drug screen result due to possible interfering substances. A more specific alternate chemical method must be used in order to obtain a confirmed analytical result. Gas chromatography / mass spectrometry (GC/MS) is the preferred confirm atory method. Performed at [facility], [address] [place], [place] [address]   Urinalysis, Routine w reflex microscopic -Urine, Clean Catch     Status: Abnormal   Collection Time: 10/07/22  1:47 PM  Result Value Ref Range   Color, Urine YELLOW (A) YELLOW   APPearance CLEAR (A) CLEAR   Specific Gravity, Urine 1.006 1.005 - 1.030   pH 6.0 5.0 - 8.0   Glucose, UA NEGATIVE NEGATIVE mg/dL   Hgb urine dipstick NEGATIVE NEGATIVE   Bilirubin Urine NEGATIVE NEGATIVE   Ketones, ur NEGATIVE NEGATIVE mg/dL   Protein, ur NEGATIVE NEGATIVE mg/dL   Nitrite NEGATIVE NEGATIVE   Leukocytes,Ua NEGATIVE NEGATIVE    Comment: Performed at [facility], [address] [place], [place] [address]    Current Facility-Administered Medications  Medication Dose Route Frequency Provider Last Rate Last Admin   acyclovir (ZOVIRAX) 200 MG capsule 400 mg  400 mg Oral Daily Vanessa Kimball, MD   400 mg at 10/08/22 1012   ALPRAZolam (XANAX) tablet 0.5 mg  0.5 mg Oral Daily PRN Maeson Purohit H, NP       irbesartan (AVAPRO) tablet 75 mg  75 mg Oral Daily Vanessa [place], MD   75 mg at 10/07/22 1802   mirtazapine (REMERON) tablet 7.5 mg  7.5 mg  Oral QHS Bazil Dhanani H, NP   7.5 mg at 10/07/22 2321   risperiDONE (RISPERDAL) tablet 0.25 mg  0.25 mg Oral BID Tariana Moldovan H, NP   0.25 mg at 10/08/22 1012   timolol (TIMOPTIC) 0.5 % ophthalmic solution 1 drop  1 drop Both Eyes BID Rada Hay, MD       Current Outpatient Medications  Medication Sig Dispense Refill   acyclovir (ZOVIRAX) 400 MG tablet Take 1 tablet (400 mg total) by mouth daily. 90 tablet 1   alendronate (FOSAMAX) 70 MG tablet Take 1 tablet by mouth every 7 days on an empty stomach with a full glass of water 12 tablet 2   ALPRAZolam (XANAX) 0.5 MG tablet Take 0.5 mg by mouth daily as needed.  aspirin 81 MG tablet Take 81 mg by mouth at bedtime.     Cholecalciferol (VITAMIN D3) 75 MCG (3000 UT) TABS Take 1 tablet by mouth daily. Take one by mouth daily 90 tablet 3   cyanocobalamin 1000 MCG tablet Take one by mouth daily     rosuvastatin (CRESTOR) 20 MG tablet Take 1 tablet (20 mg total) by mouth daily. 90 tablet 3   telmisartan (MICARDIS) 20 MG tablet Take 1 tablet (20 mg total) by mouth daily. 90 tablet 1   timolol (TIMOPTIC) 0.5 % ophthalmic solution Place 1 drop into both eyes 2 (two) times daily.      citalopram (CELEXA) 10 MG tablet Take by mouth every morning. (Patient not taking: Reported on 10/02/2022)     magnesium oxide (MAG-OX) 400 MG tablet Take 400 mg by mouth daily. (Patient not taking: Reported on 10/02/2022)     risperiDONE (RISPERDAL) 0.25 MG tablet Take 1 tablet (0.25 mg total) by mouth at bedtime. (Patient not taking: Reported on 10/02/2022) 30 tablet 2    Musculoskeletal: Strength & Muscle Tone: within normal limits Gait & Station:  Did not observe Patient leans: N/A  Psychiatric Specialty Exam: Physical Exam Vitals and nursing note reviewed.  HENT:     Head: Normocephalic.     Nose: Nose normal.  Pulmonary:     Effort: Pulmonary effort is normal.  Musculoskeletal:     Cervical back: Normal range of motion.  Neurological:      Mental Status: She is alert. She is disoriented.  Psychiatric:        Attention and Perception: She does not perceive auditory or visual hallucinations.        Mood and Affect: Affect is tearful.        Speech: Speech is tangential.        Behavior: Behavior is cooperative.        Thought Content: Thought content is delusional. Thought content does not include homicidal or suicidal ideation.        Cognition and Memory: Cognition is impaired. Memory is impaired.        Judgment: Judgment is inappropriate.     Review of Systems  Blood pressure (!) 113/56, pulse 71, temperature 98.1 F (36.7 C), temperature source Oral, resp. rate 18, height '5\' 3"'$  (1.6 m), weight 49.5 kg, SpO2 100 %.Body mass index is 19.33 kg/m.  General Appearance: Casual  Eye Contact:  Good  Speech:  Clear and Coherent  Volume:  Normal  Mood:  Euthymic  Affect:  Congruent  Thought Process:  Descriptions of Associations: Loose  Orientation:  Other:  Oriented to month, city , state   Thought Content:  Delusions Children in the home    Suicidal Thoughts:  No  Homicidal Thoughts:  No  Memory:  Immediate;   Poor  Judgement:  Impaired  Insight:  Lacking  Psychomotor Activity:  Normal  Concentration:  Concentration: Fair  Recall:  Poor  Fund of Knowledge:  Poor  Language:  Fair  Akathisia:  No  Handed:  Right  AIMS (if indicated):     Assets:  Financial Resources/Insurance  ADL's:  Intact  Cognition:  Impaired,  Severe  Sleep:   WNL        Physical Exam: Physical Exam Vitals and nursing note reviewed.  HENT:     Head: Normocephalic.     Nose: Nose normal.  Pulmonary:     Effort: Pulmonary effort is normal.  Musculoskeletal:     Cervical back: Normal  range of motion.  Neurological:     Mental Status: She is alert. She is disoriented.  Psychiatric:        Attention and Perception: She does not perceive auditory or visual hallucinations.        Mood and Affect: Affect is tearful.        Speech:  Speech is tangential.        Behavior: Behavior is cooperative.        Thought Content: Thought content is delusional. Thought content does not include homicidal or suicidal ideation.        Cognition and Memory: Cognition is impaired. Memory is impaired.        Judgment: Judgment is inappropriate.    ROS Blood pressure (!) 113/56, pulse 71, temperature 98.1 F (36.7 C), temperature source Oral, resp. rate 18, height '5\' 3"'$  (1.6 m), weight 49.5 kg, SpO2 100 %. Body mass index is 19.33 kg/m.  Treatment Plan Summary: There is no indication that she will benefit from short-term geriatric psychiatric hospitalization at this time. Medication management: Restarted home medications Celexa 10 mg po daily, alprazolam 0.5 mg daily as needed, Risperidone 0.25 mg po twice a day. Added low dose mirtazapine 7.5 mg po at bedtime to aid in appetite stimulation, also beneficial of sleep support. TOC consult placed.  Plan reviewed with Dr. Weber Cooks and Dr. Myrene Buddy, [place].    Disposition: No evidence of imminent risk to self or others at present. Patient does not meet criteria for psychiatric inpatient admission. Supportive therapy provided about ongoing stressors.  Ronny Flurry, NP 10/08/2022 1:37 PM

## 2022-10-08 NOTE — ED Notes (Signed)
IVC/Pending TOC Placement 

## 2022-10-08 NOTE — ED Notes (Signed)
EDT asked pt "Would you like a snack tonight or would you like to wait until morning?" Pt requested to not have any snack to night and stated "I would rather wait until morning". No snack provided to pt, per pt request.

## 2022-10-08 NOTE — ED Notes (Signed)
Pt refused to allow this tech to obtain vital signs. Pt stated she "already did this today". Pt made aware vital signs are to be assessed on her at least twice a day to ensure good health. Pt still refused. Pt denied needing anything else at this time.

## 2022-10-08 NOTE — ED Notes (Addendum)
Pt took medications voluntarily when this RN brought medications to pt room. Pt reports blood pressure medication made her dizzy yesterday, requesting not to take this one. BP on assessment 113/56- will hold irbesartan today. Pt appears alert and responding appropriately to questions. Pt alert and oriented x3 at this time. Pt not able to remember why she is here at the hospital. Pt asking when the doctor will let her go home, as she wishes to go home today. This RN asked if anyone could stay with patient at home- she reports her sister helps her at home. Pt able to feed self and perform her own ADLs, but with direction from staff.

## 2022-10-08 NOTE — Progress Notes (Signed)
TOC acknowledges consult, CSW is working with [facility] supervisor and treatment team for appropriate discharge planning venue to meet patient's psychiatric needs.   Kelby Fam, [place], MSW, [place]

## 2022-10-08 NOTE — ED Notes (Signed)
Pt offered a snack and drink at this time and pt declined. Pt states no other needs at this time.

## 2022-10-08 NOTE — NC FL2 (Addendum)
[place] LEVEL OF CARE FORM     IDENTIFICATION  Patient Name: Audrey Duran Birthdate: 12-Sep-1934 Sex: female Admission Date (Current Location): 10/07/2022  [facility] and [place] Number:  Engineering geologist and Address:  [facility], [address], [place], [place] [address]      Provider Number: [id number]  Attending Physician Name and Address:  No att. providers found  Relative Name and Phone Number:  Amado Coe [place] Daughter   [phone]    Current Level of Care: Hospital Recommended Level of Care: [place] Prior Approval Number:    Date Approved/Denied:   PASRR Number:    Discharge Plan: Domiciliary (Rest home) (ALF)    Current Diagnoses: Patient Active Problem List   Diagnosis Date Noted   Altered mental status 10/07/2022   Protein-calorie malnutrition ([place]) 10/07/2022   Decreased appetite 10/07/2022   Acute renal failure (ARF) ([place]) 04/27/2022   Essential hypertension 10/09/2020   Aortic atherosclerosis ([place]) 10/08/2020   Grief at loss of child 04/05/2020   S/P laparoscopic appendectomy 02/27/2020   History of herpes labialis 02/05/2020   Lower extremity edema 02/01/2020   Delusional disorder ([place]) 01/30/2020   Psychosis, 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[place]) 01/15/2020   Unintentional weight loss 04/16/2019   Episodes of formed visual hallucinations 10/12/2018   Abnormal mammogram of right breast 04/08/2018   Impaired fasting glucose 08/29/2017   Prediabetes 05/21/2016   History of vertigo 11/16/2015   Hospital discharge follow-up 04/19/2015   History of Clostridium difficile colitis 12/14/2014   Long-term use of high-risk medication 05/08/2014   S/P TAH-BSO (total abdominal hysterectomy and bilateral salpingo-oophorectomy) 10/26/2013   Vitamin D deficiency 10/12/2013   CAD (coronary artery disease), native coronary artery 11/18/2012   CKD (chronic kidney disease) stage 3, GFR  30-59 ml/min (HCC) 10/19/2012   Eosinophilia 09/21/2012   Anxiety state 09/20/2012   Osteopenia of the elderly 06/20/2012   At high risk for falls 06/20/2012   Encounter for preventive health examination 04/25/2012   Atrophic vaginitis 01/17/2012   Paroxysmal atrial fibrillation (HCC)    Recurrent HSV (herpes simplex virus)    COPD (chronic obstructive pulmonary disease) ([place])    Ischemic cardiomyopathy    Cystocele or rectocele with complete uterine prolapse 08/02/2011   Screening for breast cancer 07/31/2011   Screening for cervical cancer 07/31/2011   Screening for colon cancer 07/31/2011   Coronary artery disease    History of myocardial infarction    Hyperlipidemia LDL goal <70     Orientation RESPIRATION BLADDER Height & Weight     Self  Normal Continent Weight: 109 lb 2 oz (49.5 kg) Height:  '5\' 3"'$  (160 cm)  BEHAVIORAL SYMPTOMS/MOOD NEUROLOGICAL BOWEL NUTRITION STATUS      Continent Diet (Regular)  AMBULATORY STATUS COMMUNICATION OF NEEDS Skin   Independent Verbally Normal                       Personal Care Assistance Level of Assistance  Bathing, Feeding, Dressing Bathing Assistance: Limited assistance Feeding assistance: Independent Dressing Assistance: Limited assistance     Functional Limitations Info  Hearing, Speech, Sight Sight Info: Adequate Hearing Info: Adequate Speech Info: Adequate    SPECIAL CARE FACTORS FREQUENCY                       Contractures Contractures Info: Not present    Additional Factors Info  Code Status, Allergies Code Status Info: Full  Code Allergies Info: Azithromycin  Clarithromycin  Diclofenac  Lisinopril  Meloxicam  Nitrofurantoin Monohyd Macro  Zocor (Simvastatin)  Penicillins           Current Medications (10/08/2022):  This is the current hospital active medication list Current Facility-Administered Medications  Medication Dose Route Frequency Provider Last Rate Last Admin   acyclovir (ZOVIRAX) 200 MG  capsule 400 mg  400 mg Oral Daily Vanessa Trainer, MD   400 mg at 10/08/22 1012   ALPRAZolam (XANAX) tablet 0.5 mg  0.5 mg Oral Daily PRN Bennett, Christal H, NP       irbesartan (AVAPRO) tablet 75 mg  75 mg Oral Daily Vanessa Clintwood, MD   75 mg at 10/07/22 1802   mirtazapine (REMERON) tablet 7.5 mg  7.5 mg Oral QHS Bennett, Christal H, NP   7.5 mg at 10/07/22 2321   risperiDONE (RISPERDAL) tablet 0.25 mg  0.25 mg Oral BID Bennett, Christal H, NP   0.25 mg at 10/08/22 1012   timolol (TIMOPTIC) 0.5 % ophthalmic solution 1 drop  1 drop Both Eyes BID Rada Hay, MD       Current Outpatient Medications  Medication Sig Dispense Refill   acyclovir (ZOVIRAX) 400 MG tablet Take 1 tablet (400 mg total) by mouth daily. 90 tablet 1   alendronate (FOSAMAX) 70 MG tablet Take 1 tablet by mouth every 7 days on an empty stomach with a full glass of water 12 tablet 2   ALPRAZolam (XANAX) 0.5 MG tablet Take 0.5 mg by mouth daily as needed.     aspirin 81 MG tablet Take 81 mg by mouth at bedtime.     Cholecalciferol (VITAMIN D3) 75 MCG (3000 UT) TABS Take 1 tablet by mouth daily. Take one by mouth daily 90 tablet 3   cyanocobalamin 1000 MCG tablet Take one by mouth daily     rosuvastatin (CRESTOR) 20 MG tablet Take 1 tablet (20 mg total) by mouth daily. 90 tablet 3   telmisartan (MICARDIS) 20 MG tablet Take 1 tablet (20 mg total) by mouth daily. 90 tablet 1   timolol (TIMOPTIC) 0.5 % ophthalmic solution Place 1 drop into both eyes 2 (two) times daily.      citalopram (CELEXA) 10 MG tablet Take by mouth every morning. (Patient not taking: Reported on 10/02/2022)     magnesium oxide (MAG-OX) 400 MG tablet Take 400 mg by mouth daily. (Patient not taking: Reported on 10/02/2022)     risperiDONE (RISPERDAL) 0.25 MG tablet Take 1 tablet (0.25 mg total) by mouth at bedtime. (Patient not taking: Reported on 10/02/2022) 30 tablet 2     Discharge Medications: Please see discharge summary for a list of discharge  medications.  Relevant Imaging Results:  Relevant Lab Results:   Additional Information SSN [id number]  Ross Ludwig, LCSW

## 2022-10-08 NOTE — ED Notes (Signed)
Social worker and NP at bedside discussing patient status with daughter at this time.

## 2022-10-08 NOTE — ED Provider Notes (Signed)
Emergency Medicine Observation Re-evaluation Note  Audrey Duran is a 88 y.o. female, seen on rounds today.  Pt initially presented to the ED for complaints of No chief complaint on file.   Physical Exam  BP (!) 162/49 (BP Location: Right Arm)   Pulse 60   Temp 97.9 F (36.6 C) (Oral)   Resp 16   Ht '5\' 3"'$  (1.6 m)   Wt 49.5 kg   SpO2 100%   BMI 19.33 kg/m  Physical Exam General: Resting comfortably Lungs: No increased work of breathing Psych: Calm and cooperative no agitation  ED Course / MDM  EKG:   I have reviewed the labs performed to date as well as medications administered while in observation.  Recent changes in the last 24 hours include patient seen by psychiatry and they adjusted medications and recommend reassessment this morning.  Plan  Current plan is for reassessment by psychiatry.    Rada Hay, MD 10/08/22 [phone]

## 2022-10-08 NOTE — ED Notes (Signed)
Daughter at bedside visiting.

## 2022-10-08 NOTE — ED Notes (Signed)
Pt now only alert to her name. Unable to answer the questions she was able to answer on prior assessment. Pt asking this RN for a ride to [place]. When redirected, pt asked again for a ride to [place] and to call her sister Freda Munro

## 2022-10-08 NOTE — ED Notes (Signed)
pending consult .Marland KitchenMarland Kitchen

## 2022-10-08 NOTE — TOC Initial Note (Addendum)
Transition of Care [facility]) - Initial/Assessment Note    Patient Details  Name: Audrey Duran MRN: [id number] Date of Birth: May 31, 1935  [facility]) CM/SW Contact:    Ross Ludwig, LCSW Phone Number: 10/08/2022, 6:26 PM  Clinical Narrative:                 TOC received consult that patient's family needs assistance with placement for ALF.  Due to patient's increased confusion assessment completed by speaking to patient's daughter Joseph Art.  Patient was living alone and driving herself, but as of yesterday patient has been significantly more confused, and not aware of where she is.  Psych has seen her and feel she does not meet inpatient psych criteria.  Patient is currently under IVC which is valid through 9am on 10/14/2022.  Patient's family have started looking for ALFs for patient, they have toured [place], [place].  Per patient's daughter, it has been hard to convince patient to go to an ALF, however they are hopeful that due to this current hospitalization, patient will be more open to going to an ALF.  CSW spent time talking to family answering appropriate questions regarding ALF options, difference between memory care and regular ALF.  Also discussed how to pay for facilities.  CSW explained they can be anywhere from $4000-$10000, daughter would have to talk to specific facilities regarding exact cost and services available.  Per patient's daughter she has assets that can cover her for probably about a year.  CSW explained what the process is for applying for Medicaid once she has spent down her funds.  CSW also informed daughter that typically TOC does not find placement for LTC or ALF patients from the hospital, usually the family has to work on it, but CSW will see what assistance can be provided at this time.  Per patient's daughter they would prefer that she goes to [facility] due to the location and familiarity of the area for the patient.   CSW was asked what the process is for placement, CSW explained what the process, the paperwork from the hospital that facilities need, and how TOC makes referrals.  Per patient's daughter she would like TOC to reach out to [facility], [place].  CSW asked if it would be okay to make a referral to either [facility] or [facility], per patient's daughter they have spoken to [facility], but would like CSW to reach out to the other agencies as well if the preferred facilities are not able to accept patient.  CSW contacted [facility] in [place] and spoke to [employer], she will reach out to patient's family and discuss [facility] Means as a possibility and will come to complete an assessment tomorrow if family is agreeable to it.  CSW requested to have her contact this CSW once she arrives.  CSW to complete FL2 for Lattie Haw to review.  TOC will updated patient's family as needed, TOC to continue to follow patient's progress throughout discharge planning.   Expected Discharge Plan: Assisted Living Barriers to Discharge: Other (must enter comment) (ALF bed placement.)   Patient Goals and CMS Choice Patient states their goals for this hospitalization and ongoing recovery are:: Family would like her to go to an ALF. CMS Medicare.gov Compare Post Acute Care list provided to:: Patient Represenative (must comment) Choice offered to / list presented to : Adult [place] ownership interest in [facility].provided to:: Adult  Children    Expected Discharge Plan and Services     Post Acute Care Choice:  (ALF facility) Living arrangements for the past 2 months: Mobile Home                                      Prior Living Arrangements/Services Living arrangements for the past 2 months: Mobile Home Lives with:: Self Patient language and need for interpreter reviewed:: Yes Do you feel safe going back to the place where you live?: No   Patient's family feel  she needs to go to ALF instead of going back home due to increase in confusion.  Need for Family Participation in Patient Care: Yes (Comment) Care giver support system in place?: No (comment)   Criminal Activity/Legal Involvement Pertinent to Current Situation/Hospitalization: No - Comment as needed  Activities of Daily Living      Permission Sought/Granted Permission sought to share information with : Facility Sport and exercise psychologist, Family Supports Permission granted to share information with : Yes, Verbal Permission Granted  Share Information with NAME: Amado Coe [place] Daughter   [phone]  Permission granted to share info w AGENCY: ALF placements        Emotional Assessment Appearance:: Appears stated age Attitude/Demeanor/Rapport: Inconsistent Affect (typically observed): Grieving, Frustrated, Tearful/Crying Orientation: : Oriented to Self Alcohol / Substance Use: Not Applicable Psych Involvement: Yes (comment)  Admission diagnosis:  beh med eval Patient Active Problem List   Diagnosis Date Noted   Altered mental status 10/07/2022   Protein-calorie malnutrition ([place]) 10/07/2022   Decreased appetite 10/07/2022   Acute renal failure (ARF) ([place]) 04/27/2022   Essential hypertension 10/09/2020   Aortic atherosclerosis ([place]) 10/08/2020   Grief at loss of child 04/05/2020   S/P laparoscopic appendectomy 02/27/2020   History of herpes labialis 02/05/2020   Lower extremity edema 02/01/2020   Delusional disorder ([place]) 01/30/2020   Psychosis, 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[place]) 01/15/2020   Unintentional weight loss 04/16/2019   Episodes of formed visual hallucinations 10/12/2018   Abnormal mammogram of right breast 04/08/2018   Impaired fasting glucose 08/29/2017   Prediabetes 05/21/2016   History of vertigo 11/16/2015   Hospital discharge follow-up 04/19/2015   History of Clostridium difficile colitis 12/14/2014   Long-term use of high-risk medication  05/08/2014   S/P TAH-BSO (total abdominal hysterectomy and bilateral salpingo-oophorectomy) 10/26/2013   Vitamin D deficiency 10/12/2013   CAD (coronary artery disease), native coronary artery 11/18/2012   CKD (chronic kidney disease) stage 3, GFR 30-59 ml/min (HCC) 10/19/2012   Eosinophilia 09/21/2012   Anxiety state 09/20/2012   Osteopenia of the elderly 06/20/2012   At high risk for falls 06/20/2012   Encounter for preventive health examination 04/25/2012   Atrophic vaginitis 01/17/2012   Paroxysmal atrial fibrillation (HCC)    Recurrent HSV (herpes simplex virus)    COPD (chronic obstructive pulmonary disease) ([place])    Ischemic cardiomyopathy    Cystocele or rectocele with complete uterine prolapse 08/02/2011   Screening for breast cancer 07/31/2011   Screening for cervical cancer 07/31/2011   Screening for colon cancer 07/31/2011   Coronary artery disease    History of myocardial infarction    Hyperlipidemia LDL goal <70    PCP:  Crecencio Mc, MD Pharmacy:   [facility], [place] - [address] [place] [address] Phone: [phone] Fax: [phone]     Social  Determinants of Health (SDOH) Social History: SDOH Screenings   Food Insecurity: No Food Insecurity (12/03/2020)  Housing: Low Risk  (12/03/2020)  Transportation Needs: No Transportation Needs (12/03/2020)  Depression (PHQ2-9): Low Risk  (10/02/2022)  Financial Resource Strain: Low Risk  (12/03/2020)  Social Connections: Unknown (12/03/2020)  Stress: No Stress Concern Present (12/03/2020)  Tobacco Use: Medium Risk (10/02/2022)   SDOH Interventions:     Readmission Risk Interventions     No data to display

## 2022-10-09 MED ORDER — MELATONIN 5 MG PO TABS
5.0000 mg | ORAL_TABLET | Freq: Every day | ORAL | Status: DC
  Administered 2022-10-09 – 2022-10-14 (×6): 5 mg via ORAL
  Filled 2022-10-09 (×6): qty 1

## 2022-10-09 NOTE — ED Notes (Signed)
IVC/  PENDING  PLACEMENT 

## 2022-10-09 NOTE — ED Notes (Signed)
IVC/TOC placement

## 2022-10-09 NOTE — ED Notes (Signed)
Pt is back up and in hall. Pt trying to go into other patients rooms to wake them up. Pt seems worried about being left alone at this time. She was able to be redirected.

## 2022-10-09 NOTE — ED Notes (Signed)
Daughter called to update staff of ER. Pt had possible acceptance at [facility] next week and will need a TB test placed. Will make sure oncoming AM staff is made aware.  Daughter - Joseph Art [phone]

## 2022-10-09 NOTE — ED Notes (Addendum)
Refused breakfast. Refused shower. Hesitant to allow VS and only compliant to take one med even with RN, security and psych NP telling her how necessary the medications ordered are to take. Pt clean and dry.

## 2022-10-09 NOTE — ED Notes (Signed)
Pt advised she is feeling very anxious. This RN told her she had medicine to help with that if she would take it.This RN got pt to talk all three of her meds.

## 2022-10-09 NOTE — TOC Progression Note (Signed)
Transition of Care [facility]) - Progression Note    Patient Details  Name: Audrey Duran MRN: [id number] Date of Birth: [birth year]  Transition of Care [facility]) CM/SW Contact  Ross Ludwig, [place] Phone Number: 10/09/2022, 5:31 PM  Clinical Narrative:     CSW spoke to patient's daughter Joseph Art and she told this CSW that [place] feels patient needs memory care and they do not have any beds available at this time.  CSW asked if daughter would like referral to be made to [facility], and she said that would be fine.  Referral made to [facility] per family request.  CSW contacted Andee Poles at [facility] and explained situation, she will try to contact the patient's daughter.  TOC to continue to follow patient's progress throughout discharge planning.  Expected Discharge Plan: Assisted Living Barriers to Discharge: Other (must enter comment) (ALF bed placement.)  Expected Discharge Plan and Services     Post Acute Care Choice:  (ALF facility) Living arrangements for the past 2 months: Mobile Home                                       Social Determinants of Health (SDOH) Interventions SDOH Screenings   Food Insecurity: No Food Insecurity (12/03/2020)  Housing: Low Risk  (12/03/2020)  Transportation Needs: No Transportation Needs (12/03/2020)  Depression (PHQ2-9): Low Risk  (10/02/2022)  Financial Resource Strain: Low Risk  (12/03/2020)  Social Connections: Unknown (12/03/2020)  Stress: No Stress Concern Present (12/03/2020)  Tobacco Use: Medium Risk (10/02/2022)    Readmission Risk Interventions     No data to display

## 2022-10-09 NOTE — ED Provider Notes (Signed)
-----------------------------------------   5:44 AM on 10/09/2022 -----------------------------------------   Blood pressure (!) 113/56, pulse 71, temperature 98.1 F (36.7 C), temperature source Oral, resp. rate 18, height 5\' 3"  (1.6 m), weight 49.5 kg, SpO2 100 %.  The patient is calm and cooperative at this time.  There have been no acute events since the last update.  Awaiting disposition plan from Social Work team.   Paulette Blanch, MD 10/09/22 [phone]

## 2022-10-09 NOTE — ED Notes (Signed)
Pt still very restless. This RN walked her around the department. Pt ambulatory with no issues.

## 2022-10-10 NOTE — ED Provider Notes (Signed)
-----------------------------------------   6:41 AM on 10/10/2022 -----------------------------------------   Blood pressure (!) 137/49, pulse (!) 107, temperature 98.3 F (36.8 C), temperature source Oral, resp. rate 16, height 1.6 m (5\' 3" ), weight 49.5 kg, SpO2 98 %.  The patient is calm and cooperative at this time.  There have been no acute events since the last update.  Awaiting disposition plan from [facility] team.   Hinda Kehr, MD 10/10/22 [phone]

## 2022-10-10 NOTE — Progress Notes (Signed)
CSW attempted to reach out to pt's daughter to talk about SNF choice, no answer. CSW will follow up later on this afternoon.   [place]  [phone]

## 2022-10-10 NOTE — ED Notes (Signed)
Patient is IVC pending placement 

## 2022-10-10 NOTE — TOC Progression Note (Signed)
Transition of Care [facility]) - Progression Note    Patient Details  Name: Audrey Duran MRN: [id number] Date of Birth: [birth year]  Transition of Care [facility]) CM/SW [place], [place] Phone Number: 10/10/2022, 3:54 PM  Clinical Narrative:     CSW was able to speak to pt's daughter, she states she is working with Albina Billet at [employer], she will be touring a [facility] near [place] tomorrow. She states [facility] has immediate availability but it is in [place]. She states [facility] near [place] has pt first on the waiting list. She states she will have a decision made by Monday. CSW explained pt would need transportation. TOC will follow up with pt's daughter on Monday for her decision.    Expected Discharge Plan: Assisted Living Barriers to Discharge: Other (must enter comment) (ALF bed placement.)  Expected Discharge Plan and Services     Post Acute Care Choice:  (ALF facility) Living arrangements for the past 2 months: Mobile Home                                       Social Determinants of Health (SDOH) Interventions SDOH Screenings   Food Insecurity: No Food Insecurity (12/03/2020)  Housing: Low Risk  (12/03/2020)  Transportation Needs: No Transportation Needs (12/03/2020)  Depression (PHQ2-9): Low Risk  (10/02/2022)  Financial Resource Strain: Low Risk  (12/03/2020)  Social Connections: Unknown (12/03/2020)  Stress: No Stress Concern Present (12/03/2020)  Tobacco Use: Medium Risk (10/02/2022)    Readmission Risk Interventions     No data to display

## 2022-10-10 NOTE — ED Notes (Signed)
This tech obtained vitals on pt.  

## 2022-10-10 NOTE — ED Notes (Signed)
Pt received snack and beverage. Pt has no other needs at the moment.

## 2022-10-11 ENCOUNTER — Emergency Department: Payer: PPO

## 2022-10-11 DIAGNOSIS — Z111 Encounter for screening for respiratory tuberculosis: Secondary | ICD-10-CM | POA: Diagnosis not present

## 2022-10-11 LAB — RESP PANEL BY RT-PCR (RSV, FLU A&B, COVID)  RVPGX2
Influenza A by PCR: NEGATIVE
Influenza B by PCR: NEGATIVE
Resp Syncytial Virus by PCR: NEGATIVE
SARS Coronavirus 2 by RT PCR: NEGATIVE

## 2022-10-11 NOTE — ED Provider Notes (Signed)
-----------------------------------------   6:02 AM on 10/11/2022 -----------------------------------------   Blood pressure 127/70, pulse 87, temperature (!) 97.4 F (36.3 C), temperature source Oral, resp. rate 18, height 5\' 3"  (1.6 m), weight 49.5 kg, SpO2 97 %.  The patient is calm and cooperative at this time.  There have been no acute events since the last update.  Awaiting disposition plan from case management/social work.    Mahamud Metts, Delice Bison, DO 10/11/22 [phone]

## 2022-10-11 NOTE — Progress Notes (Signed)
Audrey Duran with [facility] stated [facility] has been accepted into [facility]. Covid, TB test, and updated FL2 needed for placement. Covid and Chest-X-ray was ordered today. Please fax results [phone].

## 2022-10-11 NOTE — ED Notes (Signed)
Pt urinated in the room on floor. This tech assisted pt to the restroom and dressed pt out into clean burgundy hospital attire. Pt back in rm and assisted onto bed. This tech used towels to clean urine up. No other needs voiced at this time.

## 2022-10-11 NOTE — ED Notes (Signed)
IVC/TOC placement

## 2022-10-11 NOTE — ED Notes (Signed)
Pt provided with meal tray and drink at this time.

## 2022-10-11 NOTE — ED Notes (Signed)
Pt refused snack at this time.  

## 2022-10-11 NOTE — ED Notes (Signed)
Care home representative to room to assess pt for placement.

## 2022-10-11 NOTE — ED Notes (Signed)
Pt given breakfast tray and drink at this time. 

## 2022-10-12 MED ORDER — RISPERIDONE 0.25 MG PO TABS: 0.2500 mg | ORAL_TABLET | Freq: Every day | ORAL | 2 refills | Status: AC

## 2022-10-12 MED ORDER — TELMISARTAN 20 MG PO TABS: 20.0000 mg | ORAL_TABLET | Freq: Every day | ORAL | 2 refills | Status: DC

## 2022-10-12 MED ORDER — MIRTAZAPINE 7.5 MG PO TABS: 7.5000 mg | ORAL_TABLET | Freq: Every day | ORAL | 2 refills | Status: AC

## 2022-10-12 MED ORDER — ACYCLOVIR 400 MG PO TABS: 400.0000 mg | ORAL_TABLET | Freq: Every day | ORAL | 2 refills | Status: AC

## 2022-10-12 MED ORDER — TIMOLOL MALEATE 0.5 % OP SOLN: 1.0000 [drp] | Freq: Two times a day (BID) | OPHTHALMIC | 0 refills | Status: AC

## 2022-10-12 MED ORDER — ALPRAZOLAM 0.5 MG PO TABS: 0.5000 mg | ORAL_TABLET | Freq: Every day | ORAL | 0 refills | Status: DC | PRN

## 2022-10-12 NOTE — TOC Transition Note (Signed)
Transition of Care [facility]) - CM/SW Discharge Note   Patient Details  Name: Audrey Duran MRN: [id number] Date of Birth: March 19, 1935  Transition of Care [facility]) CM/SW Contact:  Ross Ludwig, LCSW Phone Number: 10/12/2022, 5:09 PM   Clinical Narrative:     CSW spoke to patient's daughter Joseph Art [phone].  She has decided to take patient home with her, and [facility] will be assessing patient tomorrow.  CSW faxed a copy of the FL2 and facesheet to [place] at 5:05pm [place].  Per patient's daughter she has decided to take patient home and will work on trying to find a placement option for patient at home.  Patient's daughter asked about how she can access a copy of the FL2, CSW informed her that she would have to contact patient's PCP to get one or contact medical records at the hospital.  [facility] signing off, please reconsult if other social work needs arise.  Final next level of care: Home/Self Care Barriers to Discharge: Barriers Resolved   Patient Goals and CMS Choice CMS Medicare.gov Compare Post Acute Care list provided to:: Patient Represenative (must comment) Choice offered to / list presented to : Adult Children  Discharge Placement                         Discharge Plan and Services Additional resources added to the After Visit Summary for       Post Acute Care Choice:  (ALF facility)                               Social Determinants of Health (SDOH) Interventions SDOH Screenings   Food Insecurity: No Food Insecurity (12/03/2020)  Housing: Low Risk  (12/03/2020)  Transportation Needs: No Transportation Needs (12/03/2020)  Depression (PHQ2-9): Low Risk  (10/02/2022)  Financial Resource Strain: Low Risk  (12/03/2020)  Social Connections: Unknown (12/03/2020)  Stress: No Stress Concern Present (12/03/2020)  Tobacco Use: Medium Risk (10/02/2022)     Readmission Risk Interventions     No data to display

## 2022-10-12 NOTE — ED Notes (Signed)
Assisted patient in eating her breakfast.

## 2022-10-12 NOTE — NC FL2 (Addendum)
[place] LEVEL OF CARE FORM     IDENTIFICATION  Patient Name: Audrey Duran Birthdate: 03-04-1935 Sex: female Admission Date (Current Location): 10/07/2022  [facility] and [place] Number:  Engineering geologist and Address:  [facility], [address] [place], [place] [address]      Provider Number: [id number]  Attending Physician Name and Address:  No att. providers found  Relative Name and Phone Number:  Amado Coe [place] Daughter   [phone]    Current Level of Care: Hospital Recommended Level of Care: Family Care Home Prior Approval Number:    Date Approved/Denied:   PASRR Number:    Discharge Plan: Domiciliary (Rest home) [facility])    Current Diagnoses: Patient Active Problem List   Diagnosis Date Noted   Altered mental status 10/07/2022   Protein-calorie malnutrition ([place]) 10/07/2022   Decreased appetite 10/07/2022   Acute renal failure (ARF) ([place]) 04/27/2022   Essential hypertension 10/09/2020   Aortic atherosclerosis ([place]) 10/08/2020   Grief at loss of child 04/05/2020   S/P laparoscopic appendectomy 02/27/2020   History of herpes labialis 02/05/2020   Lower extremity edema 02/01/2020   Delusional disorder ([place]) 01/30/2020   Psychosis, 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   Unintentional weight loss 04/16/2019   Episodes of formed visual hallucinations 10/12/2018   Abnormal mammogram of right breast 04/08/2018   Impaired fasting glucose 08/29/2017   Prediabetes 05/21/2016   History of vertigo 11/16/2015   Hospital discharge follow-up 04/19/2015   History of Clostridium difficile colitis 12/14/2014   Long-term use of high-risk medication 05/08/2014   S/P TAH-BSO (total abdominal hysterectomy and bilateral salpingo-oophorectomy) 10/26/2013   Vitamin D deficiency 10/12/2013   CAD (coronary artery disease), native coronary artery 11/18/2012   CKD (chronic kidney disease) stage 3,  GFR 30-59 ml/min (HCC) 10/19/2012   Eosinophilia 09/21/2012   Anxiety state 09/20/2012   Osteopenia of the elderly 06/20/2012   At high risk for falls 06/20/2012   Encounter for preventive health examination 04/25/2012   Atrophic vaginitis 01/17/2012   Paroxysmal atrial fibrillation (HCC)    Recurrent HSV (herpes simplex virus)    COPD (chronic obstructive pulmonary disease) ([place])    Ischemic cardiomyopathy    Cystocele or rectocele with complete uterine prolapse 08/02/2011   Screening for breast cancer 07/31/2011   Screening for cervical cancer 07/31/2011   Screening for colon cancer 07/31/2011   Coronary artery disease    History of myocardial infarction    Hyperlipidemia LDL goal <70     Orientation RESPIRATION BLADDER Height & Weight     Self  Normal Continent Weight: 109 lb 2 oz (49.5 kg) Height:  5\' 3"  (160 cm)  BEHAVIORAL SYMPTOMS/MOOD NEUROLOGICAL BOWEL NUTRITION STATUS      Continent Diet (Regular)  AMBULATORY STATUS COMMUNICATION OF NEEDS Skin   Independent Verbally Normal                       Personal Care Assistance Level of Assistance  Bathing, Feeding, Dressing Bathing Assistance: Limited assistance Feeding assistance: Independent Dressing Assistance: Limited assistance     Functional Limitations Info  Hearing, Speech, Sight Sight Info: Adequate Hearing Info: Adequate Speech Info: Adequate    SPECIAL CARE FACTORS FREQUENCY                       Contractures Contractures Info: Not present    Additional Factors Info  Code Status, Allergies Code Status  Info: Full Code Allergies Info: Azithromycin  Clarithromycin  Diclofenac  Lisinopril  Meloxicam  Nitrofurantoin Monohyd Macro  Zocor (Simvastatin)  Penicillins           Current Medications (10/12/2022):  This is the current hospital active medication list Current Facility-Administered Medications  Medication Dose Route Frequency Provider Last Rate Last Admin   acyclovir (ZOVIRAX) 200  MG capsule 400 mg  400 mg Oral Daily Vanessa Keene, MD   400 mg at 10/12/22 [id number]   ALPRAZolam Duanne Moron) tablet 0.5 mg  0.5 mg Oral Daily PRN Richardson Landry, Christal H, NP   0.25 mg at 10/12/22 0328   irbesartan (AVAPRO) tablet 75 mg  75 mg Oral Daily Vanessa Jackpot, MD   75 mg at 10/12/22 [id number]   melatonin tablet 5 mg  5 mg Oral QHS Vanessa [place], MD   5 mg at 10/11/22 2114   mirtazapine (REMERON) tablet 7.5 mg  7.5 mg Oral QHS Bennett, Christal H, NP   7.5 mg at 10/11/22 2114   risperiDONE (RISPERDAL) tablet 0.25 mg  0.25 mg Oral BID Bennett, Christal H, NP   0.25 mg at 10/12/22 0919   timolol (TIMOPTIC) 0.5 % ophthalmic solution 1 drop  1 drop Both Eyes BID Rada Hay, MD   1 drop at 10/12/22 0919   Current Outpatient Medications  Medication Sig Dispense Refill   acyclovir (ZOVIRAX) 400 MG tablet Take 1 tablet (400 mg total) by mouth daily. 90 tablet 1   alendronate (FOSAMAX) 70 MG tablet Take 1 tablet by mouth every 7 days on an empty stomach with a full glass of water 12 tablet 2   ALPRAZolam (XANAX) 0.5 MG tablet Take 0.5 mg by mouth daily as needed.     aspirin 81 MG tablet Take 81 mg by mouth at bedtime.     Cholecalciferol (VITAMIN D3) 75 MCG (3000 UT) TABS Take 1 tablet by mouth daily. Take one by mouth daily 90 tablet 3   cyanocobalamin 1000 MCG tablet Take one by mouth daily     rosuvastatin (CRESTOR) 20 MG tablet Take 1 tablet (20 mg total) by mouth daily. 90 tablet 3   telmisartan (MICARDIS) 20 MG tablet Take 1 tablet (20 mg total) by mouth daily. 90 tablet 1   timolol (TIMOPTIC) 0.5 % ophthalmic solution Place 1 drop into both eyes 2 (two) times daily.      citalopram (CELEXA) 10 MG tablet Take by mouth every morning. (Patient not taking: Reported on 10/02/2022)     magnesium oxide (MAG-OX) 400 MG tablet Take 400 mg by mouth daily. (Patient not taking: Reported on 10/02/2022)     risperiDONE (RISPERDAL) 0.25 MG tablet Take 1 tablet (0.25 mg total) by mouth at bedtime. (Patient not  taking: Reported on 10/02/2022) 30 tablet 2     Discharge Medications: Please see discharge summary for a list of discharge medications.  Relevant Imaging Results:  Relevant Lab Results:   Additional Information SSN [id number]  Ross Ludwig, LCSW

## 2022-10-12 NOTE — ED Notes (Signed)
Hospital meal provided, pt tolerated w/o complaints.  Waste discarded appropriately.  

## 2022-10-12 NOTE — ED Notes (Signed)
Pt was repeatedly coming out of her rm trying to go home. Writer tried redirecting multiple but unsuccessful. RN Software engineer used STARR approved moved to escort pt back into rm.

## 2022-10-12 NOTE — ED Provider Notes (Signed)
-----------------------------------------   8:25 AM on 10/12/2022 -----------------------------------------   Blood pressure 134/60, pulse (!) 113, temperature 98.1 F (36.7 C), temperature source Oral, resp. rate 16, height 1.6 m (5\' 3" ), weight 49.5 kg, SpO2 96 %.  The patient is calm and cooperative at this time.  There have been no acute events since the last update.  Awaiting disposition plan from [facility] team.   Hinda Kehr, MD 10/12/22 [phone]

## 2022-10-12 NOTE — ED Notes (Signed)
PT  VOL °

## 2022-10-12 NOTE — ED Provider Notes (Addendum)
-----------------------------------------   4:10 PM on 10/12/2022 ----------------------------------------- Patient previously cleared by psychiatry, previous agitation thought to be due to dementia and patient is calm and cooperative.  Social work was subsequently consulted to arrange placement for the patient, however daughter now requesting that patient be discharged home so that she can arrange placement from there.  Given reassuring medical workup with no indication for psychiatric admission, we will discharge the patient and have her closely follow-up with her PCP.   Blake Divine, MD 10/12/22 1611  ----------------------------------------- 7:50 PM on 10/12/2022 ----------------------------------------- After attempting to load the patient into her vehicle, daughter now stating that she does not think she can handle the patient at home and would like for her to stay in the ED for placement as originally planned.    Blake Divine, MD 10/12/22 [phone]

## 2022-10-12 NOTE — TOC Progression Note (Signed)
Transition of Care [facility]) - Progression Note    Patient Details  Name: Audrey Duran MRN: [id number] Date of Birth: 1935-03-10  Transition of Care [facility]) CM/SW Contact  Ross Ludwig, [place] Phone Number: 10/12/2022, 1:13 PM  Clinical Narrative:     CSW spoke to [employer] with [facility] who said there is a family care home [facility] who has agreed to accept patient once paperwork has been completed for patient.  CSW faxed FL2, covid test, and chest x-ray to [facility].  She will let this CSW know once they are able accept patient.  Per Albina Billet, the family care home should be able to accept her tomorrow pending payment and paperwork being signed off on.  TOC to continue to follow patient's progress throughout discharge planning.  Expected Discharge Plan: Assisted Living Barriers to Discharge: Other (must enter comment) (ALF bed placement.)  Expected Discharge Plan and Services     Post Acute Care Choice:  (ALF facility) Living arrangements for the past 2 months: Mobile Home                                       Social Determinants of Health (SDOH) Interventions SDOH Screenings   Food Insecurity: No Food Insecurity (12/03/2020)  Housing: Low Risk  (12/03/2020)  Transportation Needs: No Transportation Needs (12/03/2020)  Depression (PHQ2-9): Low Risk  (10/02/2022)  Financial Resource Strain: Low Risk  (12/03/2020)  Social Connections: Unknown (12/03/2020)  Stress: No Stress Concern Present (12/03/2020)  Tobacco Use: Medium Risk (10/02/2022)    Readmission Risk Interventions     No data to display

## 2022-10-12 NOTE — ED Notes (Signed)
Attempted to discharge pt to sister - unsuccessful.

## 2022-10-12 NOTE — Discharge Instructions (Signed)
CLINICAL DATA:  TB screening.   EXAM: PORTABLE CHEST 1 VIEW   COMPARISON:  01/14/2020   FINDINGS: Lungs are hyperexpanded Cardiopericardial silhouette is at upper limits of normal for size. The lungs are clear without focal pneumonia, edema, pneumothorax or pleural effusion. Interstitial markings are diffusely coarsened with chronic features. The visualized bony structures of the thorax are unremarkable.   IMPRESSION: Hyperexpansion with chronic interstitial coarsening. No acute cardiopulmonary findings.     Electronically Signed   By: Misty Stanley M.D.   On: 10/11/2022 16:55

## 2022-10-12 NOTE — ED Notes (Signed)
IVC  PAPERS  RESCINDED PER  DR  JESSUP MD  INFORMED  [name]  RN

## 2022-10-13 DIAGNOSIS — F03911 Unspecified dementia, unspecified severity, with agitation: Secondary | ICD-10-CM

## 2022-10-13 MED ORDER — ACETAMINOPHEN 325 MG PO TABS
650.0000 mg | ORAL_TABLET | Freq: Once | ORAL | Status: AC
  Administered 2022-10-13: 650 mg via ORAL

## 2022-10-13 NOTE — ED Notes (Signed)
This RN walked around nurses station with pt. Pt steady.

## 2022-10-13 NOTE — ED Notes (Signed)
Pt assisted with phone as requested.

## 2022-10-13 NOTE — ED Notes (Signed)
Visitor with pt currently.

## 2022-10-13 NOTE — ED Notes (Signed)
Trash from pt's lunch tray thrown away; pt had not touched anything from the hot tray. Pt prompted to eat from her dinner tray but pt refused. Pt tearful about wanting to go home.

## 2022-10-13 NOTE — ED Notes (Signed)
[facility] home representative here at bedside assessing patient

## 2022-10-13 NOTE — NC FL2 (Addendum)
[place] LEVEL OF CARE FORM     IDENTIFICATION  Patient Name: Audrey Duran Birthdate: [birth year] Sex: female Admission Date (Current Location): 10/07/2022  [facility] and [place] Number:  Engineering geologist and Address:  [facility], [address] [place], [place] [address]      Provider Number: [id number]  Attending Physician Name and Address:  No att. providers found  Relative Name and Phone Number:  Amado Coe [place] Daughter   [phone]    Current Level of Care: Hospital Recommended Level of Care: Memory Care, [place] Prior Approval Number:    Date Approved/Denied:   PASRR Number:    Discharge Plan: Other (Comment) (Memory Care ALF)    Current Diagnoses: Patient Active Problem List   Diagnosis Date Noted   Dementia with agitation ([place]) 10/13/2022   Altered mental status 10/07/2022   Protein-calorie malnutrition ([place]) 10/07/2022   Decreased appetite 10/07/2022   Acute renal failure (ARF) ([place]) 04/27/2022   Essential hypertension 10/09/2020   Aortic atherosclerosis ([place]) 10/08/2020   Grief at loss of child 04/05/2020   S/P laparoscopic appendectomy 02/27/2020   History of herpes labialis 02/05/2020   Lower extremity edema 02/01/2020   Delusional disorder ([place]) 01/30/2020   Psychosis, 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   Unintentional weight loss 04/16/2019   Episodes of formed visual hallucinations 10/12/2018   Abnormal mammogram of right breast 04/08/2018   Impaired fasting glucose 08/29/2017   Prediabetes 05/21/2016   History of vertigo 11/16/2015   Hospital discharge follow-up 04/19/2015   History of Clostridium difficile colitis 12/14/2014   Long-term use of high-risk medication 05/08/2014   S/P TAH-BSO (total abdominal hysterectomy and bilateral salpingo-oophorectomy) 10/26/2013   Vitamin D deficiency 10/12/2013   CAD (coronary artery disease), native coronary  artery 11/18/2012   CKD (chronic kidney disease) stage 3, GFR 30-59 ml/min (HCC) 10/19/2012   Eosinophilia 09/21/2012   Anxiety state 09/20/2012   Osteopenia of the elderly 06/20/2012   At high risk for falls 06/20/2012   Encounter for preventive health examination 04/25/2012   Atrophic vaginitis 01/17/2012   Paroxysmal atrial fibrillation (HCC)    Recurrent HSV (herpes simplex virus)    COPD (chronic obstructive pulmonary disease) ([place])    Ischemic cardiomyopathy    Cystocele or rectocele with complete uterine prolapse 08/02/2011   Screening for breast cancer 07/31/2011   Screening for cervical cancer 07/31/2011   Screening for colon cancer 07/31/2011   Coronary artery disease    History of myocardial infarction    Hyperlipidemia LDL goal <70     Orientation RESPIRATION BLADDER Height & Weight     Self  Normal Continent Weight: 109 lb 2 oz (49.5 kg) Height:  5\' 3"  (160 cm)  BEHAVIORAL SYMPTOMS/MOOD NEUROLOGICAL BOWEL NUTRITION STATUS      Continent Diet  AMBULATORY STATUS COMMUNICATION OF NEEDS Skin   Independent Verbally Normal                       Personal Care Assistance Level of Assistance  Bathing, Feeding, Dressing Bathing Assistance: Limited assistance Feeding assistance: Independent Dressing Assistance: Limited assistance     Functional Limitations Info  Sight, Hearing, Speech Sight Info: Adequate Hearing Info: Adequate Speech Info: Adequate    SPECIAL CARE FACTORS FREQUENCY                       Contractures Contractures Info: Not present    Additional Factors  Info  Code Status, Allergies, Psychotropic Code Status Info: Full Code Allergies Info: Azithromycin  Clarithromycin  Diclofenac  Lisinopril  Meloxicam  Nitrofurantoin Monohyd Macro  Zocor (Simvastatin)  Penicillins Psychotropic Info: risperiDONE (RISPERDAL) tablet 0.25 mg         Current Medications (10/13/2022):  This is the current hospital active medication list Current  Facility-Administered Medications  Medication Dose Route Frequency Provider Last Rate Last Admin   acyclovir (ZOVIRAX) 200 MG capsule 400 mg  400 mg Oral Daily Vanessa [place], MD   400 mg at 10/13/22 1053   ALPRAZolam (XANAX) tablet 0.5 mg  0.5 mg Oral Daily PRN Richardson Landry, Christal H, NP   0.5 mg at 10/12/22 2018   irbesartan (AVAPRO) tablet 75 mg  75 mg Oral Daily Vanessa [place], MD   75 mg at 10/13/22 1053   melatonin tablet 5 mg  5 mg Oral QHS Vanessa [place], MD   5 mg at 10/12/22 2018   mirtazapine (REMERON) tablet 7.5 mg  7.5 mg Oral QHS Bennett, Christal H, NP   7.5 mg at 10/12/22 2019   risperiDONE (RISPERDAL) tablet 0.25 mg  0.25 mg Oral BID Bennett, Christal H, NP   0.25 mg at 10/13/22 1052   timolol (TIMOPTIC) 0.5 % ophthalmic solution 1 drop  1 drop Both Eyes BID Rada Hay, MD   1 drop at 10/13/22 1052   Current Outpatient Medications  Medication Sig Dispense Refill   alendronate (FOSAMAX) 70 MG tablet Take 1 tablet by mouth every 7 days on an empty stomach with a full glass of water 12 tablet 2   aspirin 81 MG tablet Take 81 mg by mouth at bedtime.     Cholecalciferol (VITAMIN D3) 75 MCG (3000 UT) TABS Take 1 tablet by mouth daily. Take one by mouth daily 90 tablet 3   cyanocobalamin 1000 MCG tablet Take one by mouth daily     mirtazapine (REMERON) 7.5 MG tablet Take 1 tablet (7.5 mg total) by mouth at bedtime. 30 tablet 2   rosuvastatin (CRESTOR) 20 MG tablet Take 1 tablet (20 mg total) by mouth daily. 90 tablet 3   acyclovir (ZOVIRAX) 400 MG tablet Take 1 tablet (400 mg total) by mouth daily. 30 tablet 2   ALPRAZolam (XANAX) 0.5 MG tablet Take 1 tablet (0.5 mg total) by mouth daily as needed. 30 tablet 0   citalopram (CELEXA) 10 MG tablet Take by mouth every morning. (Patient not taking: Reported on 10/02/2022)     magnesium oxide (MAG-OX) 400 MG tablet Take 400 mg by mouth daily. (Patient not taking: Reported on 10/02/2022)     risperiDONE (RISPERDAL) 0.25 MG tablet Take 1 tablet  (0.25 mg total) by mouth at bedtime. 30 tablet 2   telmisartan (MICARDIS) 20 MG tablet Take 1 tablet (20 mg total) by mouth daily. 30 tablet 2   timolol (TIMOPTIC) 0.5 % ophthalmic solution Place 1 drop into both eyes 2 (two) times daily. 10 mL 0     Discharge Medications: Please see discharge summary for a list of discharge medications.  Relevant Imaging Results:  Relevant Lab Results:   Additional Information SSN [id number]  Ross Ludwig, LCSW

## 2022-10-13 NOTE — ED Notes (Signed)
Pt tolerated visit with family well. Pt alert and resting calmly on stretcher.

## 2022-10-13 NOTE — ED Notes (Signed)
Pt's family Jillyn Ledger here to visit briefly. Pt verbal okay for her to do so. Security notified.

## 2022-10-13 NOTE — ED Notes (Signed)
Pt's dinner tray and drink at bedside; pt currently asleep; chest rise and fall noted.

## 2022-10-13 NOTE — ED Notes (Signed)
Pt given coloring book & crayons.

## 2022-10-13 NOTE — TOC Progression Note (Signed)
Transition of Care [facility]) - Progression Note    Patient Details  Name: Audrey Duran MRN: [id number] Date of Birth: 1935-05-11  Transition of Care [facility]) CM/SW Contact  Ross Ludwig, [place] Phone Number: 10/13/2022, 5:46 PM  Clinical Narrative:     CSW was informed that patient did not discharge last night as originally planned.  [facility] to assess patient in the ED today since patient did not discharge to daughter's home last night.    CSW received phone call from Milus Glazier at [facility] who requested clinicals and [facility] for patient to assist with finding placement for patient for patient.  CSW emailed requested clinicals to DSS.  Tammy from [place] called this CSW to request and updated FL2 with dementia diagnosis on patient and updated physician notes.  CSW faxed requested information to [facility] so they can continue to review and make a decision if they can accept patient.  3:00pm CSW spoke to Austin at [place], and she stated that daughter would like patient to be in a locked memory care unit.  Tammy stated they don't have a locked memory care unit.  CSW then contacted Milus Glazier at [facility] again and informed her of what the daughter Joseph Art had said.  Magda Paganini then sent FL2 to [facility] who said they can potentially accept patient tomorrow if daughter consents to patient being admitted to ALF.  Magda Paganini requested updated chest scan or TB test, CSW informed her that it was completed yesterday, and can email to her, she agreed to accept it.  TOC to continue to follow patient's progress throughout discharge planning.  Expected Discharge Plan: Assisted Living Barriers to Discharge: Barriers Resolved  Expected Discharge Plan and Services     Post Acute Care Choice:  (ALF facility) Living arrangements for the past 2 months: Mobile Home                                       Social Determinants of Health (SDOH)  Interventions SDOH Screenings   Food Insecurity: No Food Insecurity (12/03/2020)  Housing: Low Risk  (12/03/2020)  Transportation Needs: No Transportation Needs (12/03/2020)  Depression (PHQ2-9): Low Risk  (10/02/2022)  Financial Resource Strain: Low Risk  (12/03/2020)  Social Connections: Unknown (12/03/2020)  Stress: No Stress Concern Present (12/03/2020)  Tobacco Use: Medium Risk (10/02/2022)    Readmission Risk Interventions     No data to display

## 2022-10-13 NOTE — ED Provider Notes (Signed)
-----------------------------------------   5:00 AM on 10/13/2022 -----------------------------------------   Blood pressure (!) 120/91, pulse 71, temperature 97.9 F (36.6 C), resp. rate 18, height 5\' 3"  (1.6 m), weight 49.5 kg, SpO2 98 %.  The patient is calm and cooperative at this time.  There have been no acute events since the last update.  Awaiting disposition plan from Social Work team.   Paulette Blanch, MD 10/13/22 0500

## 2022-10-13 NOTE — ED Notes (Signed)
ED tech Lattie Haw assisted patient to restroom by walking with her.

## 2022-10-13 NOTE — ED Notes (Signed)
Pt attempting to get out of bed; staff at bedside redirecting pt and educating.

## 2022-10-13 NOTE — NC FL2 (Deleted)
[place] LEVEL OF CARE FORM     IDENTIFICATION  Patient Name: Audrey Duran Birthdate: 11-03-1934 Sex: female Admission Date (Current Location): 10/07/2022  [facility] and [place] Number:  Engineering geologist and Address:  [facility], [address], [place], [place] [address]      Provider Number: [id number]  Attending Physician Name and Address:  No att. providers found  Relative Name and Phone Number:  Amado Coe [place] Daughter   [phone]    Current Level of Care: Hospital Recommended Level of Care: [place], Memory Care Prior Approval Number:    Date Approved/Denied:   PASRR Number:    Discharge Plan: Other (Comment) (Memory care ALF)    Current Diagnoses: Patient Active Problem List   Diagnosis Date Noted   Altered mental status 10/07/2022   Protein-calorie malnutrition ([place]) 10/07/2022   Decreased appetite 10/07/2022   Acute renal failure (ARF) ([place]) 04/27/2022   Essential hypertension 10/09/2020   Aortic atherosclerosis ([place]) 10/08/2020   Grief at loss of child 04/05/2020   S/P laparoscopic appendectomy 02/27/2020   History of herpes labialis 02/05/2020   Lower extremity edema 02/01/2020   Delusional disorder ([place]) 01/30/2020   Psychosis, 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   Unintentional weight loss 04/16/2019   Episodes of formed visual hallucinations 10/12/2018   Abnormal mammogram of right breast 04/08/2018   Impaired fasting glucose 08/29/2017   Prediabetes 05/21/2016   History of vertigo 11/16/2015   Hospital discharge follow-up 04/19/2015   History of Clostridium difficile colitis 12/14/2014   Long-term use of high-risk medication 05/08/2014   S/P TAH-BSO (total abdominal hysterectomy and bilateral salpingo-oophorectomy) 10/26/2013   Vitamin D deficiency 10/12/2013   CAD (coronary artery disease), native coronary artery 11/18/2012   CKD (chronic kidney  disease) stage 3, GFR 30-59 ml/min (HCC) 10/19/2012   Eosinophilia 09/21/2012   Anxiety state 09/20/2012   Osteopenia of the elderly 06/20/2012   At high risk for falls 06/20/2012   Encounter for preventive health examination 04/25/2012   Atrophic vaginitis 01/17/2012   Paroxysmal atrial fibrillation (HCC)    Recurrent HSV (herpes simplex virus)    COPD (chronic obstructive pulmonary disease) ([place])    Ischemic cardiomyopathy    Cystocele or rectocele with complete uterine prolapse 08/02/2011   Screening for breast cancer 07/31/2011   Screening for cervical cancer 07/31/2011   Screening for colon cancer 07/31/2011   Coronary artery disease    History of myocardial infarction    Hyperlipidemia LDL goal <70     Orientation RESPIRATION BLADDER Height & Weight     Self  Normal Continent Weight: 109 lb 2 oz (49.5 kg) Height:  5\' 3"  (160 cm)  BEHAVIORAL SYMPTOMS/MOOD NEUROLOGICAL BOWEL NUTRITION STATUS      Continent Diet (Regular)  AMBULATORY STATUS COMMUNICATION OF NEEDS Skin   Independent Verbally Normal                       Personal Care Assistance Level of Assistance  Bathing, Feeding, Dressing Bathing Assistance: Limited assistance Feeding assistance: Independent Dressing Assistance: Limited assistance     Functional Limitations Info  Hearing, Speech, Sight Sight Info: Adequate Hearing Info: Adequate Speech Info: Adequate    SPECIAL CARE FACTORS FREQUENCY                       Contractures Contractures Info: Not present    Additional Factors Info  Code Status, Allergies Code  Status Info: Full Code Allergies Info: Azithromycin  Clarithromycin  Diclofenac  Lisinopril  Meloxicam  Nitrofurantoin Monohyd Macro  Zocor (Simvastatin)  Penicillins           Current Medications (10/13/2022):  This is the current hospital active medication list Current Facility-Administered Medications  Medication Dose Route Frequency Provider Last Rate Last Admin    acyclovir (ZOVIRAX) 200 MG capsule 400 mg  400 mg Oral Daily Vanessa [place], MD   400 mg at 10/13/22 1053   ALPRAZolam (XANAX) tablet 0.5 mg  0.5 mg Oral Daily PRN Richardson Landry, Christal H, NP   0.5 mg at 10/12/22 2018   irbesartan (AVAPRO) tablet 75 mg  75 mg Oral Daily Vanessa [place], MD   75 mg at 10/13/22 1053   melatonin tablet 5 mg  5 mg Oral QHS Vanessa [place], MD   5 mg at 10/12/22 2018   mirtazapine (REMERON) tablet 7.5 mg  7.5 mg Oral QHS Bennett, Christal H, NP   7.5 mg at 10/12/22 2019   risperiDONE (RISPERDAL) tablet 0.25 mg  0.25 mg Oral BID Bennett, Christal H, NP   0.25 mg at 10/13/22 1052   timolol (TIMOPTIC) 0.5 % ophthalmic solution 1 drop  1 drop Both Eyes BID Rada Hay, MD   1 drop at 10/13/22 1052   Current Outpatient Medications  Medication Sig Dispense Refill   alendronate (FOSAMAX) 70 MG tablet Take 1 tablet by mouth every 7 days on an empty stomach with a full glass of water 12 tablet 2   aspirin 81 MG tablet Take 81 mg by mouth at bedtime.     Cholecalciferol (VITAMIN D3) 75 MCG (3000 UT) TABS Take 1 tablet by mouth daily. Take one by mouth daily 90 tablet 3   cyanocobalamin 1000 MCG tablet Take one by mouth daily     mirtazapine (REMERON) 7.5 MG tablet Take 1 tablet (7.5 mg total) by mouth at bedtime. 30 tablet 2   rosuvastatin (CRESTOR) 20 MG tablet Take 1 tablet (20 mg total) by mouth daily. 90 tablet 3   acyclovir (ZOVIRAX) 400 MG tablet Take 1 tablet (400 mg total) by mouth daily. 30 tablet 2   ALPRAZolam (XANAX) 0.5 MG tablet Take 1 tablet (0.5 mg total) by mouth daily as needed. 30 tablet 0   citalopram (CELEXA) 10 MG tablet Take by mouth every morning. (Patient not taking: Reported on 10/02/2022)     magnesium oxide (MAG-OX) 400 MG tablet Take 400 mg by mouth daily. (Patient not taking: Reported on 10/02/2022)     risperiDONE (RISPERDAL) 0.25 MG tablet Take 1 tablet (0.25 mg total) by mouth at bedtime. 30 tablet 2   telmisartan (MICARDIS) 20 MG tablet Take 1  tablet (20 mg total) by mouth daily. 30 tablet 2   timolol (TIMOPTIC) 0.5 % ophthalmic solution Place 1 drop into both eyes 2 (two) times daily. 10 mL 0     Discharge Medications: Please see discharge summary for a list of discharge medications.  Relevant Imaging Results:  Relevant Lab Results:   Additional Information SSN [id number]  Ross Ludwig, LCSW

## 2022-10-13 NOTE — ED Notes (Signed)
Pt continues to request to have someone called to take her home; explained delay to pt; pt stated "I want to brush my teeth and take a nice warm bath"; gave pt 2 more warm blankets, assisted pt in repositioning in bed; pt was able to do the physical work but needed direction from this RN; adjustments made to [facility] and foot of bed to help keep pt from sliding down in it; pt given cup with minimal water, tooth brush, tooth paste and extra cup to spit in; educated; pt denies any other needs currently.

## 2022-10-13 NOTE — ED Notes (Signed)
Pt brushing her teeth.

## 2022-10-14 MED ORDER — ALPRAZOLAM 0.5 MG PO TABS: 0.5000 mg | ORAL_TABLET | Freq: Every day | ORAL | 0 refills | Status: AC | PRN

## 2022-10-14 MED ORDER — ALPRAZOLAM 0.5 MG PO TABS: 0.5000 mg | ORAL_TABLET | Freq: Once | ORAL | Status: DC

## 2022-10-14 NOTE — ED Provider Notes (Signed)
-----------------------------------------   5:52 AM on 10/14/2022 -----------------------------------------   Blood pressure 114/87, pulse 65, temperature 98.1 F (36.7 C), temperature source Oral, resp. rate 16, height 5\' 3"  (1.6 m), weight 49.5 kg, SpO2 98 %.  The patient is calm and cooperative at this time.  There have been no acute events since the last update.  Awaiting disposition plan from Social Work team.   Paulette Blanch, MD 10/14/22 [phone]

## 2022-10-14 NOTE — ED Notes (Signed)
Vol going to  the  [facility]   in the  am

## 2022-10-14 NOTE — ED Notes (Signed)
Pt resting on stretcher with eyes closed and even respirations. No distress noted at this time. Easily awoken; offered breakfast but pt refused.

## 2022-10-14 NOTE — ED Notes (Signed)
Pt's dinner tray and beverage provided. Pt assisted to sitting in chair with bedside table.

## 2022-10-14 NOTE — ED Notes (Signed)
Pt attempting to walk down hallway and talking loudly to staff about going to see her sister. Pt encouraged to go back to her bed and educated about her safety. Pt asking for crayon and paper. Provided by this nurse.

## 2022-10-14 NOTE — ED Notes (Signed)
Pt offered her breakfast tray and pt refused.

## 2022-10-14 NOTE — ED Notes (Signed)
Lunch and water provided

## 2022-10-14 NOTE — ED Notes (Signed)
Pt has been experiencing increasing restlessness and has been pacing and repetitively folding her linens. Pt was confused and attempting to come into nurses station. PRN given at this time for anxiety

## 2022-10-14 NOTE — ED Notes (Addendum)
Pt refusing to eat the dinner that has been provided for her. Encouraged to eat her fruit and pt agreed to eat a bowl of cereal instead. Frosted flakes provided. Ate 100%

## 2022-10-14 NOTE — ED Notes (Signed)
Pt talking loudly to staff about not being able to remember numbers and wanting to go home. Repeatedly asking how close we are to the airport in [place] and her church. Pt states she wants to walk there and reports that it is not too far. Reoriented to being in the hospital currently and too far from her home to walk there. Pt tearful.

## 2022-10-14 NOTE — ED Notes (Signed)
Pt sitting with ED tech talking. Appears more calm at this time.

## 2022-10-14 NOTE — ED Notes (Signed)
Pt trying to walk down the hallway and "go home". Pt loudly states she is going to walk home because she has not been there in over a week. Pt redirected by this nurse and deputy.

## 2022-10-14 NOTE — TOC Progression Note (Addendum)
Transition of Care [facility]) - Progression Note    Patient Details  Name: Audrey Duran MRN: [id number] Date of Birth: 08/24/1934  Transition of [facility]) CM/SW Contact  Ross Ludwig, [place] Phone Number: 10/14/2022, 4:55 PM  Clinical Narrative:     Patient has a bed available at [facility] for tomorrow morning after 10am.  CSW confirmed with Rachel Bo the administrator that they have agreed to accept patient tomorrow.  CSW spoke to patient's daughter to confirm that they have agreed to send patient to [facility].  Per patient's daughter Audrey Duran, she would like EMS transport to facility due to patient's dementia, anxiety, hallucinations , high fall risk and delusions.  CSW attempted to contact [employer] to begin insurance authorization for EMS transport.    Patient's daughter is aware that there may be a cost if insurance does not approve.  Patient's daughter still requesting EMS transport to Memory Care facility tomorrow.  The [employer] requested that Xanax prescription be faxed to him.  CSW faxed requested prescription for patient at 4:50pm, confirmation received that it has been accepted. TOC to continue to facilitate discharge planning to ALF.   Expected Discharge Plan: Assisted Living Barriers to Discharge: Barriers Resolved  Expected Discharge Plan and Services     Post Acute Care Choice:  (ALF facility) Living arrangements for the past 2 months: Mobile Home                                       Social Determinants of Health (SDOH) Interventions SDOH Screenings   Food Insecurity: No Food Insecurity (12/03/2020)  Housing: Low Risk  (12/03/2020)  Transportation Needs: No Transportation Needs (12/03/2020)  Depression (PHQ2-9): Low Risk  (10/02/2022)  Financial Resource Strain: Low Risk  (12/03/2020)  Social Connections: Unknown (12/03/2020)  Stress: No Stress Concern Present (12/03/2020)  Tobacco Use: Medium Risk (10/02/2022)     Readmission Risk Interventions     No data to display

## 2022-10-15 DIAGNOSIS — I959 Hypotension, unspecified: Secondary | ICD-10-CM | POA: Diagnosis not present

## 2022-10-15 DIAGNOSIS — Z743 Need for continuous supervision: Secondary | ICD-10-CM | POA: Diagnosis not present

## 2022-10-15 NOTE — ED Notes (Signed)
Patient did not want meal at this time. Tray at bedside.

## 2022-10-15 NOTE — TOC Transition Note (Signed)
Transition of Care [facility]) - CM/SW Discharge Note   Patient Details  Name: Audrey Duran MRN: [id number] Date of Birth: 01/01/1935  Transition of [facility]) CM/SW Contact:  Ross Ludwig, LCSW Phone Number: 10/15/2022, 12:42 PM   Clinical Narrative:     CSW spoke to Scientist, research (life sciences) at [employer] care, he said patient can be accepted today.  CSW faxed updated FL2 with discharge medications and AVS to [phone], confirmation that it has been received at 11:19am.  Patient was approved for EMS transport by patient's insurance, auth number [id number].  Patient to be d/c'ed today to [facility].  Patient and family agreeable to plans will transport via ems RN to call report.    Final next level of care: Assisted Living Barriers to Discharge: Barriers Resolved   Patient Goals and CMS Choice CMS Medicare.gov Compare Post Acute Care list provided to:: Patient Represenative (must comment) Choice offered to / list presented to : Adult Children  Discharge Placement  Patient discharging to [facility]F.                       Discharge Plan and Services Additional resources added to the After Visit Summary for       Post Acute Care Choice:  (ALF facility)                               Social Determinants of Health (SDOH) Interventions SDOH Screenings   Food Insecurity: No Food Insecurity (12/03/2020)  Housing: Low Risk  (12/03/2020)  Transportation Needs: No Transportation Needs (12/03/2020)  Depression (PHQ2-9): Low Risk  (10/02/2022)  Financial Resource Strain: Low Risk  (12/03/2020)  Social Connections: Unknown (12/03/2020)  Stress: No Stress Concern Present (12/03/2020)  Tobacco Use: Medium Risk (10/02/2022)     Readmission Risk Interventions     No data to display

## 2022-10-15 NOTE — TOC Progression Note (Addendum)
Transition of Care [facility]) - Progression Note    Patient Details  Name: Audrey Duran MRN: [id number] Date of Birth: 07-May-1935  Transition of Care [facility]) CM/SW Contact  Ross Ludwig, [place] Phone Number: 10/15/2022, 9:26 AM  Clinical Narrative:     CSW spoke to [place] at [facility], regarding EMS transport approval.  She is working on the EMS authorization, once it is received. EMS transport can be arranged.  10:45am Insurance auth approved for EMS auth number [id number]  Expected Discharge Plan: Assisted Living Barriers to Discharge: Barriers Resolved  Expected Discharge Plan and Services     Post Acute Care Choice:  (ALF facility) Living arrangements for the past 2 months: Mobile Home                                       Social Determinants of Health (SDOH) Interventions SDOH Screenings   Food Insecurity: No Food Insecurity (12/03/2020)  Housing: Low Risk  (12/03/2020)  Transportation Needs: No Transportation Needs (12/03/2020)  Depression (PHQ2-9): Low Risk  (10/02/2022)  Financial Resource Strain: Low Risk  (12/03/2020)  Social Connections: Unknown (12/03/2020)  Stress: No Stress Concern Present (12/03/2020)  Tobacco Use: Medium Risk (10/02/2022)    Readmission Risk Interventions     No data to display

## 2022-10-15 NOTE — ED Notes (Signed)
Hospital meal provided, pt is still sleeping at this time.

## 2022-10-15 NOTE — ED Notes (Signed)
VOL/Pending Placement today

## 2022-10-15 NOTE — ED Notes (Signed)
[facility]  CALLED  FOR  TRANSPORT  TO  THE  [facility] [facility]  [place] 

## 2022-10-15 NOTE — NC FL2 (Signed)
[place] LEVEL OF CARE FORM     IDENTIFICATION  Patient Name: Audrey Duran Birthdate: September 07, 1934 Sex: female Admission Date (Current Location): 10/07/2022  [facility] and [place] Number:  Engineering geologist and Address:  [facility], [address] [place], [place] [address]      Provider Number: [id number]  Attending Physician Name and Address:  No att. providers found  Relative Name and Phone Number:  Amado Coe [place] Daughter   [phone]    Current Level of Care: Hospital Recommended Level of Care: Memory Care, [place] Prior Approval Number:    Date Approved/Denied:   PASRR Number:    Discharge Plan: Other (Comment) (Memory Care ALF)    Current Diagnoses: Patient Active Problem List   Diagnosis Date Noted   Dementia with agitation ([place]) 10/13/2022   Altered mental status 10/07/2022   Protein-calorie malnutrition ([place]) 10/07/2022   Decreased appetite 10/07/2022   Acute renal failure (ARF) ([place]) 04/27/2022   Essential hypertension 10/09/2020   Aortic atherosclerosis ([place]) 10/08/2020   Grief at loss of child 04/05/2020   S/P laparoscopic appendectomy 02/27/2020   History of herpes labialis 02/05/2020   Lower extremity edema 02/01/2020   Delusional disorder ([place]) 01/30/2020   Psychosis, 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[place]) 01/15/2020   Unintentional weight loss 04/16/2019   Episodes of formed visual hallucinations 10/12/2018   Abnormal mammogram of right breast 04/08/2018   Impaired fasting glucose 08/29/2017   Prediabetes 05/21/2016   History of vertigo 11/16/2015   Hospital discharge follow-up 04/19/2015   History of Clostridium difficile colitis 12/14/2014   Long-term use of high-risk medication 05/08/2014   S/P TAH-BSO (total abdominal hysterectomy and bilateral salpingo-oophorectomy) 10/26/2013   Vitamin D deficiency 10/12/2013   CAD (coronary artery disease), native coronary  artery 11/18/2012   CKD (chronic kidney disease) stage 3, GFR 30-59 ml/min (HCC) 10/19/2012   Eosinophilia 09/21/2012   Anxiety state 09/20/2012   Osteopenia of the elderly 06/20/2012   At high risk for falls 06/20/2012   Encounter for preventive health examination 04/25/2012   Atrophic vaginitis 01/17/2012   Paroxysmal atrial fibrillation (HCC)    Recurrent HSV (herpes simplex virus)    COPD (chronic obstructive pulmonary disease) ([place])    Ischemic cardiomyopathy    Cystocele or rectocele with complete uterine prolapse 08/02/2011   Screening for breast cancer 07/31/2011   Screening for cervical cancer 07/31/2011   Screening for colon cancer 07/31/2011   Coronary artery disease    History of myocardial infarction    Hyperlipidemia LDL goal <70     Orientation RESPIRATION BLADDER Height & Weight     Self  Normal Continent Weight: 109 lb 2 oz (49.5 kg) Height:  5\' 3"  (160 cm)  BEHAVIORAL SYMPTOMS/MOOD NEUROLOGICAL BOWEL NUTRITION STATUS      Continent Diet  AMBULATORY STATUS COMMUNICATION OF NEEDS Skin   Independent Verbally Normal                       Personal Care Assistance Level of Assistance  Bathing, Feeding, Dressing Bathing Assistance: Limited assistance Feeding assistance: Independent Dressing Assistance: Limited assistance     Functional Limitations Info  Sight, Hearing, Speech Sight Info: Adequate Hearing Info: Adequate Speech Info: Adequate    SPECIAL CARE FACTORS FREQUENCY                       Contractures Contractures Info: Not present    Additional Factors  Info  Code Status, Allergies, Psychotropic Code Status Info: Full Code Allergies Info: Azithromycin  Clarithromycin  Diclofenac  Lisinopril  Meloxicam  Nitrofurantoin Monohyd Macro  Zocor (Simvastatin)  Penicillins Psychotropic Info: risperiDONE (RISPERDAL) tablet 0.25 mg         Current Medications (10/15/2022):  This is the current hospital active medication list Current  Facility-Administered Medications  Medication Dose Route Frequency Provider Last Rate Last Admin   acyclovir (ZOVIRAX) 200 MG capsule 400 mg  400 mg Oral Daily Vanessa [place], MD   400 mg at 10/14/22 1052   ALPRAZolam Duanne Moron) tablet 0.5 mg  0.5 mg Oral Daily PRN Richardson Landry, Christal H, NP   0.5 mg at 10/14/22 2354   ALPRAZolam Duanne [place]on) tablet 0.5 mg  0.5 mg Oral Once Lucillie Garfinkel, MD       irbesartan (AVAPRO) tablet 75 mg  75 mg Oral Daily Vanessa Lilly, MD   75 mg at 10/14/22 1052   melatonin tablet 5 mg  5 mg Oral QHS Vanessa [place], MD   5 mg at 10/14/22 2121   mirtazapine (REMERON) tablet 7.5 mg  7.5 mg Oral QHS Bennett, Christal H, NP   7.5 mg at 10/14/22 2122   risperiDONE (RISPERDAL) tablet 0.25 mg  0.25 mg Oral BID Bennett, Christal H, NP   0.25 mg at 10/14/22 2124   timolol (TIMOPTIC) 0.5 % ophthalmic solution 1 drop  1 drop Both Eyes BID Rada Hay, MD   1 drop at 10/14/22 2125   Current Outpatient Medications  Medication Sig Dispense Refill   alendronate (FOSAMAX) 70 MG tablet Take 1 tablet by mouth every 7 days on an empty stomach with a full glass of water 12 tablet 2   aspirin 81 MG tablet Take 81 mg by mouth at bedtime.     Cholecalciferol (VITAMIN D3) 75 MCG (3000 UT) TABS Take 1 tablet by mouth daily. Take one by mouth daily 90 tablet 3   cyanocobalamin 1000 MCG tablet Take one by mouth daily     mirtazapine (REMERON) 7.5 MG tablet Take 1 tablet (7.5 mg total) by mouth at bedtime. 30 tablet 2   rosuvastatin (CRESTOR) 20 MG tablet Take 1 tablet (20 mg total) by mouth daily. 90 tablet 3   acyclovir (ZOVIRAX) 400 MG tablet Take 1 tablet (400 mg total) by mouth daily. 30 tablet 2   ALPRAZolam (XANAX) 0.5 MG tablet Take 1 tablet (0.5 mg total) by mouth daily as needed. 30 tablet 0   citalopram (CELEXA) 10 MG tablet Take by mouth every morning. (Patient not taking: Reported on 10/02/2022)     magnesium oxide (MAG-OX) 400 MG tablet Take 400 mg by mouth daily. (Patient not taking:  Reported on 10/02/2022)     risperiDONE (RISPERDAL) 0.25 MG tablet Take 1 tablet (0.25 mg total) by mouth at bedtime. 30 tablet 2   telmisartan (MICARDIS) 20 MG tablet Take 1 tablet (20 mg total) by mouth daily. 30 tablet 2   timolol (TIMOPTIC) 0.5 % ophthalmic solution Place 1 drop into both eyes 2 (two) times daily. 10 mL 0     Discharge Medications: acyclovir 400 MG tablet Commonly known as: ZOVIRAX Take 1 tablet (400 mg total) by mouth daily.  ALPRAZolam 0.5 MG tablet Commonly known as: XANAX Take 1 tablet (0.5 mg total) by mouth daily as needed.  mirtazapine 7.5 MG tablet Commonly known as: REMERON Take 1 tablet (7.5 mg total) by mouth at bedtime.  risperiDONE 0.25 MG tablet Commonly known as:  RISPERDAL Take 1 tablet (0.25 mg total) by mouth at bedtime.  telmisartan 20 MG tablet Commonly known as: MICARDIS Take 1 tablet (20 mg total) by mouth daily.  timolol 0.5 % ophthalmic solution Commonly known as: TIMOPTIC Place 1 drop into both eyes 2 (two) times daily.    Relevant Imaging Results:  Relevant Lab Results:   Additional Information SSN [id number]  Ross Ludwig, LCSW

## 2022-10-15 NOTE — ED Notes (Signed)
Report to [facility] at the [facility],  [phone]

## 2022-10-15 NOTE — ED Notes (Signed)
IVC/Pending Placement 

## 2022-10-15 NOTE — ED Notes (Signed)
Pt sleeping, clean and dry at this time. Unable to take medications at this time due to sleeping.

## 2022-10-15 NOTE — ED Provider Notes (Addendum)
-----------------------------------------   10:57 AM on 10/15/2022 ----------------------------------------- Social work has arranged for discharge to the [facility], EMS transport was arranged.  [facility].   Blake Divine, MD 10/15/22 1057    Blake Divine, MD 10/15/22 1058

## 2022-10-20 DIAGNOSIS — R634 Abnormal weight loss: Secondary | ICD-10-CM | POA: Diagnosis not present

## 2022-10-20 DIAGNOSIS — E46 Unspecified protein-calorie malnutrition: Secondary | ICD-10-CM | POA: Diagnosis not present

## 2022-10-20 DIAGNOSIS — I480 Paroxysmal atrial fibrillation: Secondary | ICD-10-CM | POA: Diagnosis not present

## 2022-10-20 DIAGNOSIS — I10 Essential (primary) hypertension: Secondary | ICD-10-CM | POA: Diagnosis not present

## 2022-10-20 DIAGNOSIS — F411 Generalized anxiety disorder: Secondary | ICD-10-CM | POA: Diagnosis not present

## 2022-10-20 DIAGNOSIS — J449 Chronic obstructive pulmonary disease, unspecified: Secondary | ICD-10-CM | POA: Diagnosis not present

## 2022-10-20 DIAGNOSIS — E785 Hyperlipidemia, unspecified: Secondary | ICD-10-CM | POA: Diagnosis not present

## 2022-10-26 ENCOUNTER — Telehealth: Payer: Self-pay | Admitting: Internal Medicine

## 2022-10-26 DIAGNOSIS — E782 Mixed hyperlipidemia: Secondary | ICD-10-CM | POA: Diagnosis not present

## 2022-10-26 DIAGNOSIS — Z79899 Other long term (current) drug therapy: Secondary | ICD-10-CM | POA: Diagnosis not present

## 2022-10-26 DIAGNOSIS — E038 Other specified hypothyroidism: Secondary | ICD-10-CM | POA: Diagnosis not present

## 2022-10-26 DIAGNOSIS — E119 Type 2 diabetes mellitus without complications: Secondary | ICD-10-CM | POA: Diagnosis not present

## 2022-10-26 DIAGNOSIS — D518 Other vitamin B12 deficiency anemias: Secondary | ICD-10-CM | POA: Diagnosis not present

## 2022-10-26 NOTE — Telephone Encounter (Signed)
I put the information in the temporary address section. Was not really sure where is should go. Can you help if I did not do it right?

## 2022-10-26 NOTE — Progress Notes (Signed)
Per notes pt is living at memory care facility - LM to confirm with daughter - closing referral

## 2022-10-26 NOTE — Telephone Encounter (Signed)
Patien't daughter called and wanted Dr Derrel Nip to know that patient is living at memory care at the [facility].

## 2022-10-27 ENCOUNTER — Ambulatory Visit: Payer: PPO | Admitting: Internal Medicine

## 2022-11-06 ENCOUNTER — Ambulatory Visit: Payer: PPO | Admitting: Internal Medicine

## 2022-11-12 DIAGNOSIS — F02C18 Dementia in other diseases classified elsewhere, severe, with other behavioral disturbance: Secondary | ICD-10-CM | POA: Diagnosis not present

## 2022-11-12 DIAGNOSIS — F02B2 Dementia in other diseases classified elsewhere, moderate, with psychotic disturbance: Secondary | ICD-10-CM | POA: Diagnosis not present

## 2022-11-12 DIAGNOSIS — F419 Anxiety disorder, unspecified: Secondary | ICD-10-CM | POA: Diagnosis not present

## 2022-11-17 DIAGNOSIS — I10 Essential (primary) hypertension: Secondary | ICD-10-CM | POA: Diagnosis not present

## 2022-11-17 DIAGNOSIS — J449 Chronic obstructive pulmonary disease, unspecified: Secondary | ICD-10-CM | POA: Diagnosis not present

## 2022-11-17 DIAGNOSIS — I480 Paroxysmal atrial fibrillation: Secondary | ICD-10-CM | POA: Diagnosis not present

## 2022-11-17 DIAGNOSIS — E785 Hyperlipidemia, unspecified: Secondary | ICD-10-CM | POA: Diagnosis not present

## 2022-11-17 DIAGNOSIS — E46 Unspecified protein-calorie malnutrition: Secondary | ICD-10-CM | POA: Diagnosis not present

## 2022-11-17 DIAGNOSIS — R4189 Other symptoms and signs involving cognitive functions and awareness: Secondary | ICD-10-CM | POA: Diagnosis not present

## 2022-11-23 ENCOUNTER — Telehealth: Payer: Self-pay | Admitting: Internal Medicine

## 2022-11-23 NOTE — Telephone Encounter (Signed)
Copied from CRM [id number]. Topic: Medicare AWV >> Nov 23, 2022  1:36 PM Payton Doughty wrote: Reason for CRM: Called patient to schedule Medicare Annual Wellness Visit (AWV). Left message for patient to call back and schedule Medicare Annual Wellness Visit (AWV).  Last date of AWV: 12/05/21 Please schedule an appointment at any time with [facility], LPN  .  If any questions, please contact me.  Thank you ,  Verlee Rossetti; Care Guide Ambulatory Clinical Support [place] [facility] Direct Dial: [phone]

## 2022-11-24 DIAGNOSIS — E782 Mixed hyperlipidemia: Secondary | ICD-10-CM | POA: Diagnosis not present

## 2022-11-24 DIAGNOSIS — J449 Chronic obstructive pulmonary disease, unspecified: Secondary | ICD-10-CM | POA: Diagnosis not present

## 2022-11-24 DIAGNOSIS — I10 Essential (primary) hypertension: Secondary | ICD-10-CM | POA: Diagnosis not present

## 2022-11-24 DIAGNOSIS — E038 Other specified hypothyroidism: Secondary | ICD-10-CM | POA: Diagnosis not present

## 2022-11-24 DIAGNOSIS — E559 Vitamin D deficiency, unspecified: Secondary | ICD-10-CM | POA: Diagnosis not present

## 2022-11-24 DIAGNOSIS — D518 Other vitamin B12 deficiency anemias: Secondary | ICD-10-CM | POA: Diagnosis not present

## 2022-11-24 DIAGNOSIS — E119 Type 2 diabetes mellitus without complications: Secondary | ICD-10-CM | POA: Diagnosis not present

## 2022-11-30 DIAGNOSIS — E559 Vitamin D deficiency, unspecified: Secondary | ICD-10-CM | POA: Diagnosis not present

## 2022-11-30 DIAGNOSIS — E782 Mixed hyperlipidemia: Secondary | ICD-10-CM | POA: Diagnosis not present

## 2022-11-30 DIAGNOSIS — E038 Other specified hypothyroidism: Secondary | ICD-10-CM | POA: Diagnosis not present

## 2022-11-30 DIAGNOSIS — Z79899 Other long term (current) drug therapy: Secondary | ICD-10-CM | POA: Diagnosis not present

## 2022-11-30 DIAGNOSIS — E119 Type 2 diabetes mellitus without complications: Secondary | ICD-10-CM | POA: Diagnosis not present

## 2022-11-30 DIAGNOSIS — D518 Other vitamin B12 deficiency anemias: Secondary | ICD-10-CM | POA: Diagnosis not present

## 2022-12-07 DIAGNOSIS — F02B2 Dementia in other diseases classified elsewhere, moderate, with psychotic disturbance: Secondary | ICD-10-CM | POA: Diagnosis not present

## 2022-12-07 DIAGNOSIS — E119 Type 2 diabetes mellitus without complications: Secondary | ICD-10-CM | POA: Diagnosis not present

## 2022-12-07 DIAGNOSIS — D518 Other vitamin B12 deficiency anemias: Secondary | ICD-10-CM | POA: Diagnosis not present

## 2022-12-07 DIAGNOSIS — E782 Mixed hyperlipidemia: Secondary | ICD-10-CM | POA: Diagnosis not present

## 2022-12-07 DIAGNOSIS — F419 Anxiety disorder, unspecified: Secondary | ICD-10-CM | POA: Diagnosis not present

## 2022-12-07 DIAGNOSIS — Z79899 Other long term (current) drug therapy: Secondary | ICD-10-CM | POA: Diagnosis not present

## 2022-12-08 DIAGNOSIS — J449 Chronic obstructive pulmonary disease, unspecified: Secondary | ICD-10-CM | POA: Diagnosis not present

## 2022-12-08 DIAGNOSIS — D508 Other iron deficiency anemias: Secondary | ICD-10-CM | POA: Diagnosis not present

## 2022-12-08 DIAGNOSIS — I480 Paroxysmal atrial fibrillation: Secondary | ICD-10-CM | POA: Diagnosis not present

## 2022-12-09 DIAGNOSIS — F419 Anxiety disorder, unspecified: Secondary | ICD-10-CM | POA: Diagnosis not present

## 2022-12-09 DIAGNOSIS — Z79899 Other long term (current) drug therapy: Secondary | ICD-10-CM | POA: Diagnosis not present

## 2022-12-14 DIAGNOSIS — I10 Essential (primary) hypertension: Secondary | ICD-10-CM | POA: Diagnosis not present

## 2022-12-14 DIAGNOSIS — D518 Other vitamin B12 deficiency anemias: Secondary | ICD-10-CM | POA: Diagnosis not present

## 2022-12-14 DIAGNOSIS — E559 Vitamin D deficiency, unspecified: Secondary | ICD-10-CM | POA: Diagnosis not present

## 2022-12-14 DIAGNOSIS — E782 Mixed hyperlipidemia: Secondary | ICD-10-CM | POA: Diagnosis not present

## 2022-12-14 DIAGNOSIS — J449 Chronic obstructive pulmonary disease, unspecified: Secondary | ICD-10-CM | POA: Diagnosis not present

## 2022-12-14 DIAGNOSIS — E038 Other specified hypothyroidism: Secondary | ICD-10-CM | POA: Diagnosis not present

## 2022-12-14 DIAGNOSIS — E119 Type 2 diabetes mellitus without complications: Secondary | ICD-10-CM | POA: Diagnosis not present

## 2022-12-19 DIAGNOSIS — I10 Essential (primary) hypertension: Secondary | ICD-10-CM | POA: Diagnosis not present

## 2022-12-23 DIAGNOSIS — M25572 Pain in left ankle and joints of left foot: Secondary | ICD-10-CM | POA: Diagnosis not present

## 2022-12-23 DIAGNOSIS — R413 Other amnesia: Secondary | ICD-10-CM | POA: Diagnosis not present

## 2022-12-23 DIAGNOSIS — M25562 Pain in left knee: Secondary | ICD-10-CM | POA: Diagnosis not present

## 2022-12-23 DIAGNOSIS — N1831 Chronic kidney disease, stage 3a: Secondary | ICD-10-CM | POA: Diagnosis not present

## 2022-12-28 ENCOUNTER — Other Ambulatory Visit: Payer: Self-pay | Admitting: Neurology

## 2022-12-28 DIAGNOSIS — R413 Other amnesia: Secondary | ICD-10-CM

## 2023-01-04 ENCOUNTER — Ambulatory Visit: Payer: PPO | Admitting: Internal Medicine

## 2023-01-04 DIAGNOSIS — F419 Anxiety disorder, unspecified: Secondary | ICD-10-CM | POA: Diagnosis not present

## 2023-01-04 DIAGNOSIS — F02B2 Dementia in other diseases classified elsewhere, moderate, with psychotic disturbance: Secondary | ICD-10-CM | POA: Diagnosis not present

## 2023-01-11 ENCOUNTER — Encounter: Payer: Self-pay | Admitting: Internal Medicine

## 2023-01-11 ENCOUNTER — Ambulatory Visit (INDEPENDENT_AMBULATORY_CARE_PROVIDER_SITE_OTHER): Payer: PPO | Admitting: Internal Medicine

## 2023-01-11 VITALS — BP 130/70 | HR 62 | Temp 97.9°F | Resp 16 | Ht 63.0 in | Wt 120.2 lb

## 2023-01-11 DIAGNOSIS — F411 Generalized anxiety disorder: Secondary | ICD-10-CM

## 2023-01-11 DIAGNOSIS — F29 Unspecified psychosis not due to a substance or known physiological condition: Secondary | ICD-10-CM

## 2023-01-11 MED ORDER — CITALOPRAM HYDROBROMIDE 20 MG PO TABS: 20.0000 mg | ORAL_TABLET | Freq: Every day | ORAL | 1 refills | Status: DC
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
## 2023-01-11 NOTE — Patient Instructions (Signed)
I recommend increasing citalopram to 20 mg daily  If [facility] does not have a SECURE ASSISTED LIVING ARRANGEMENT (locked to prevent wandering),  I recommend looking at [facility]   Please consider applying/petitioning for General guardianship for [facility].  This is a court appointed position

## 2023-01-12 ENCOUNTER — Encounter: Payer: Self-pay | Admitting: Internal Medicine
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
## 2023-01-12 NOTE — Assessment & Plan Note (Signed)
Aggravated by her loss of independence and subsequent institutionalization at the [facility].  Increase citalopram to 20 mg daily

## 2023-01-19 DIAGNOSIS — I10 Essential (primary) hypertension: Secondary | ICD-10-CM | POA: Diagnosis not present

## 2023-01-22 DIAGNOSIS — E559 Vitamin D deficiency, unspecified: Secondary | ICD-10-CM | POA: Diagnosis not present

## 2023-01-22 DIAGNOSIS — D518 Other vitamin B12 deficiency anemias: Secondary | ICD-10-CM | POA: Diagnosis not present

## 2023-01-22 DIAGNOSIS — E038 Other specified hypothyroidism: Secondary | ICD-10-CM | POA: Diagnosis not present

## 2023-01-22 DIAGNOSIS — E119 Type 2 diabetes mellitus without complications: Secondary | ICD-10-CM | POA: Diagnosis not present

## 2023-01-22 DIAGNOSIS — I10 Essential (primary) hypertension: Secondary | ICD-10-CM | POA: Diagnosis not present

## 2023-01-22 DIAGNOSIS — E782 Mixed hyperlipidemia: Secondary | ICD-10-CM | POA: Diagnosis not present

## 2023-01-22 DIAGNOSIS — J449 Chronic obstructive pulmonary disease, unspecified: Secondary | ICD-10-CM | POA: Diagnosis not present

## 2023-01-25 DIAGNOSIS — I5020 Unspecified systolic (congestive) heart failure: Secondary | ICD-10-CM | POA: Diagnosis not present

## 2023-01-25 DIAGNOSIS — F03911 Unspecified dementia, unspecified severity, with agitation: Secondary | ICD-10-CM | POA: Diagnosis not present

## 2023-01-25 DIAGNOSIS — I110 Hypertensive heart disease with heart failure: Secondary | ICD-10-CM | POA: Diagnosis not present

## 2023-01-25 DIAGNOSIS — Z6821 Body mass index (BMI) 21.0-21.9, adult: Secondary | ICD-10-CM | POA: Diagnosis not present

## 2023-01-25 DIAGNOSIS — F29 Unspecified psychosis not due to a substance or known physiological condition: Secondary | ICD-10-CM | POA: Diagnosis not present

## 2023-01-25 DIAGNOSIS — F0392 Unspecified dementia, unspecified severity, with psychotic disturbance: Secondary | ICD-10-CM | POA: Diagnosis not present

## 2023-01-25 DIAGNOSIS — F331 Major depressive disorder, recurrent, moderate: Secondary | ICD-10-CM | POA: Diagnosis not present

## 2023-01-25 DIAGNOSIS — F0393 Unspecified dementia, unspecified severity, with mood disturbance: Secondary | ICD-10-CM | POA: Diagnosis not present

## 2023-01-25 DIAGNOSIS — Z515 Encounter for palliative care: Secondary | ICD-10-CM | POA: Diagnosis not present

## 2023-01-26 DIAGNOSIS — J449 Chronic obstructive pulmonary disease, unspecified: Secondary | ICD-10-CM | POA: Diagnosis not present

## 2023-01-26 DIAGNOSIS — I729 Aneurysm of unspecified site: Secondary | ICD-10-CM | POA: Diagnosis not present

## 2023-01-26 DIAGNOSIS — E785 Hyperlipidemia, unspecified: Secondary | ICD-10-CM | POA: Diagnosis not present

## 2023-01-26 DIAGNOSIS — R001 Bradycardia, unspecified: Secondary | ICD-10-CM | POA: Diagnosis not present

## 2023-01-26 DIAGNOSIS — I214 Non-ST elevation (NSTEMI) myocardial infarction: Secondary | ICD-10-CM | POA: Diagnosis not present

## 2023-01-26 DIAGNOSIS — R079 Chest pain, unspecified: Secondary | ICD-10-CM | POA: Diagnosis not present

## 2023-01-26 DIAGNOSIS — Z9889 Other specified postprocedural states: Secondary | ICD-10-CM | POA: Diagnosis not present

## 2023-01-26 DIAGNOSIS — I255 Ischemic cardiomyopathy: Secondary | ICD-10-CM | POA: Diagnosis not present

## 2023-02-08 DIAGNOSIS — F419 Anxiety disorder, unspecified: Secondary | ICD-10-CM | POA: Diagnosis not present

## 2023-02-08 DIAGNOSIS — F02B2 Dementia in other diseases classified elsewhere, moderate, with psychotic disturbance: Secondary | ICD-10-CM | POA: Diagnosis not present

## 2023-02-18 DIAGNOSIS — F419 Anxiety disorder, unspecified: Secondary | ICD-10-CM | POA: Diagnosis not present

## 2023-02-18 DIAGNOSIS — I10 Essential (primary) hypertension: Secondary | ICD-10-CM | POA: Diagnosis not present

## 2023-02-18 DIAGNOSIS — F02B2 Dementia in other diseases classified elsewhere, moderate, with psychotic disturbance: Secondary | ICD-10-CM | POA: Diagnosis not present

## 2023-02-23 DIAGNOSIS — E119 Type 2 diabetes mellitus without complications: Secondary | ICD-10-CM | POA: Diagnosis not present

## 2023-02-23 DIAGNOSIS — J449 Chronic obstructive pulmonary disease, unspecified: Secondary | ICD-10-CM | POA: Diagnosis not present

## 2023-02-23 DIAGNOSIS — E038 Other specified hypothyroidism: Secondary | ICD-10-CM | POA: Diagnosis not present

## 2023-02-23 DIAGNOSIS — I10 Essential (primary) hypertension: Secondary | ICD-10-CM | POA: Diagnosis not present

## 2023-02-23 DIAGNOSIS — E559 Vitamin D deficiency, unspecified: Secondary | ICD-10-CM | POA: Diagnosis not present

## 2023-02-23 DIAGNOSIS — E782 Mixed hyperlipidemia: Secondary | ICD-10-CM | POA: Diagnosis not present

## 2023-02-23 DIAGNOSIS — D518 Other vitamin B12 deficiency anemias: Secondary | ICD-10-CM | POA: Diagnosis not present

## 2023-02-26 ENCOUNTER — Telehealth: Payer: Self-pay | Admitting: Internal Medicine

## 2023-02-26 NOTE — Telephone Encounter (Signed)
Copied from CRM [id number]. Topic: Medicare AWV >> Feb 26, 2023  3:23 PM Payton Doughty wrote: Reason for CRM: LM 02/26/2023 to schedule AWV   Verlee Rossetti; Care Guide Ambulatory Clinical Support [place] [facility] Direct Dial: [phone]

## 2023-03-02 DIAGNOSIS — Z79899 Other long term (current) drug therapy: Secondary | ICD-10-CM | POA: Diagnosis not present

## 2023-03-02 DIAGNOSIS — E782 Mixed hyperlipidemia: Secondary | ICD-10-CM | POA: Diagnosis not present

## 2023-03-02 DIAGNOSIS — E038 Other specified hypothyroidism: Secondary | ICD-10-CM | POA: Diagnosis not present

## 2023-03-02 DIAGNOSIS — E119 Type 2 diabetes mellitus without complications: Secondary | ICD-10-CM | POA: Diagnosis not present

## 2023-03-02 DIAGNOSIS — D518 Other vitamin B12 deficiency anemias: Secondary | ICD-10-CM | POA: Diagnosis not present

## 2023-03-05 ENCOUNTER — Observation Stay
Admission: EM | Admit: 2023-03-05 | Discharge: 2023-03-08 | Disposition: A | Discharge status: Home / Self Care | Payer: PPO | Attending: Internal Medicine | Admitting: Internal Medicine

## 2023-03-05 ENCOUNTER — Emergency Department: Payer: PPO

## 2023-03-05 ENCOUNTER — Other Ambulatory Visit: Payer: Self-pay

## 2023-03-05 DIAGNOSIS — K5730 Diverticulosis of large intestine without perforation or abscess without bleeding: Secondary | ICD-10-CM | POA: Diagnosis not present

## 2023-03-05 DIAGNOSIS — I6523 Occlusion and stenosis of bilateral carotid arteries: Secondary | ICD-10-CM | POA: Diagnosis not present

## 2023-03-05 DIAGNOSIS — S199XXA Unspecified injury of neck, initial encounter: Secondary | ICD-10-CM | POA: Diagnosis not present

## 2023-03-05 DIAGNOSIS — Z7982 Long term (current) use of aspirin: Secondary | ICD-10-CM | POA: Diagnosis not present

## 2023-03-05 DIAGNOSIS — M47816 Spondylosis without myelopathy or radiculopathy, lumbar region: Secondary | ICD-10-CM | POA: Diagnosis not present

## 2023-03-05 DIAGNOSIS — J449 Chronic obstructive pulmonary disease, unspecified: Secondary | ICD-10-CM | POA: Diagnosis not present

## 2023-03-05 DIAGNOSIS — S22000A Wedge compression fracture of unspecified thoracic vertebra, initial encounter for closed fracture: Principal | ICD-10-CM

## 2023-03-05 DIAGNOSIS — F0390 Unspecified dementia without behavioral disturbance: Secondary | ICD-10-CM | POA: Insufficient documentation

## 2023-03-05 DIAGNOSIS — E875 Hyperkalemia: Secondary | ICD-10-CM | POA: Diagnosis present

## 2023-03-05 DIAGNOSIS — R079 Chest pain, unspecified: Secondary | ICD-10-CM | POA: Diagnosis not present

## 2023-03-05 DIAGNOSIS — S299XXA Unspecified injury of thorax, initial encounter: Secondary | ICD-10-CM | POA: Diagnosis not present

## 2023-03-05 DIAGNOSIS — W010XXA Fall on same level from slipping, tripping and stumbling without subsequent striking against object, initial encounter: Secondary | ICD-10-CM | POA: Diagnosis not present

## 2023-03-05 DIAGNOSIS — R262 Difficulty in walking, not elsewhere classified: Secondary | ICD-10-CM | POA: Diagnosis present

## 2023-03-05 DIAGNOSIS — S22080A Wedge compression fracture of T11-T12 vertebra, initial encounter for closed fracture: Secondary | ICD-10-CM | POA: Insufficient documentation

## 2023-03-05 DIAGNOSIS — Z87891 Personal history of nicotine dependence: Secondary | ICD-10-CM | POA: Diagnosis not present

## 2023-03-05 DIAGNOSIS — R0902 Hypoxemia: Secondary | ICD-10-CM | POA: Diagnosis not present

## 2023-03-05 DIAGNOSIS — S3991XA Unspecified injury of abdomen, initial encounter: Secondary | ICD-10-CM | POA: Diagnosis not present

## 2023-03-05 DIAGNOSIS — R0789 Other chest pain: Secondary | ICD-10-CM | POA: Diagnosis not present

## 2023-03-05 DIAGNOSIS — I2510 Atherosclerotic heart disease of native coronary artery without angina pectoris: Secondary | ICD-10-CM | POA: Diagnosis not present

## 2023-03-05 DIAGNOSIS — Z79899 Other long term (current) drug therapy: Secondary | ICD-10-CM | POA: Diagnosis not present

## 2023-03-05 DIAGNOSIS — I480 Paroxysmal atrial fibrillation: Secondary | ICD-10-CM | POA: Diagnosis not present

## 2023-03-05 DIAGNOSIS — I700 Atherosclerosis of aorta: Secondary | ICD-10-CM | POA: Diagnosis not present

## 2023-03-05 DIAGNOSIS — N1831 Chronic kidney disease, stage 3a: Secondary | ICD-10-CM | POA: Diagnosis present

## 2023-03-05 DIAGNOSIS — S22089A Unspecified fracture of T11-T12 vertebra, initial encounter for closed fracture: Secondary | ICD-10-CM | POA: Diagnosis not present

## 2023-03-05 DIAGNOSIS — S0990XA Unspecified injury of head, initial encounter: Secondary | ICD-10-CM | POA: Diagnosis not present

## 2023-03-05 DIAGNOSIS — M549 Dorsalgia, unspecified: Secondary | ICD-10-CM | POA: Diagnosis not present

## 2023-03-05 DIAGNOSIS — M4854XA Collapsed vertebra, not elsewhere classified, thoracic region, initial encounter for fracture: Secondary | ICD-10-CM | POA: Diagnosis not present

## 2023-03-05 DIAGNOSIS — R9089 Other abnormal findings on diagnostic imaging of central nervous system: Secondary | ICD-10-CM | POA: Diagnosis not present

## 2023-03-05 DIAGNOSIS — R0689 Other abnormalities of breathing: Secondary | ICD-10-CM | POA: Diagnosis not present

## 2023-03-05 LAB — CBC
HCT: 35 % — ABNORMAL LOW (ref 36.0–46.0)
Hemoglobin: 11.5 g/dL — ABNORMAL LOW (ref 12.0–15.0)
MCH: 30.5 pg (ref 26.0–34.0)
MCHC: 32.9 g/dL (ref 30.0–36.0)
MCV: 92.8 fL (ref 80.0–100.0)
Platelets: 178 10*3/uL (ref 150–400)
RBC: 3.77 MIL/uL — ABNORMAL LOW (ref 3.87–5.11)
RDW: 12.7 % (ref 11.5–15.5)
WBC: 11 10*3/uL — ABNORMAL HIGH (ref 4.0–10.5)
nRBC: 0 % (ref 0.0–0.2)

## 2023-03-05 LAB — URINALYSIS, ROUTINE W REFLEX MICROSCOPIC
Bilirubin Urine: NEGATIVE
Glucose, UA: NEGATIVE mg/dL
Hgb urine dipstick: NEGATIVE
Ketones, ur: NEGATIVE mg/dL
Leukocytes,Ua: NEGATIVE
Nitrite: NEGATIVE
Protein, ur: NEGATIVE mg/dL
Specific Gravity, Urine: 1.018 (ref 1.005–1.030)
pH: 5 (ref 5.0–8.0)

## 2023-03-05 LAB — HEPATIC FUNCTION PANEL
ALT: 10 U/L (ref 0–44)
AST: 15 U/L (ref 15–41)
Albumin: 3.3 g/dL — ABNORMAL LOW (ref 3.5–5.0)
Alkaline Phosphatase: 64 U/L (ref 38–126)
Bilirubin, Direct: 0.1 mg/dL (ref 0.0–0.2)
Indirect Bilirubin: 0.6 mg/dL (ref 0.3–0.9)
Total Bilirubin: 0.7 mg/dL (ref 0.3–1.2)
Total Protein: 6.8 g/dL (ref 6.5–8.1)

## 2023-03-05 LAB — BASIC METABOLIC PANEL
Anion gap: 8 (ref 5–15)
BUN: 39 mg/dL — ABNORMAL HIGH (ref 8–23)
CO2: 20 mmol/L — ABNORMAL LOW (ref 22–32)
Calcium: 9.6 mg/dL (ref 8.9–10.3)
Chloride: 108 mmol/L (ref 98–111)
Creatinine, Ser: 1.33 mg/dL — ABNORMAL HIGH (ref 0.44–1.00)
GFR, Estimated: 39 mL/min — ABNORMAL LOW (ref >60)
Glucose, Bld: 116 mg/dL — ABNORMAL HIGH (ref 70–99)
Potassium: 5.5 mmol/L — ABNORMAL HIGH (ref 3.5–5.1)
Sodium: 136 mmol/L (ref 135–145)

## 2023-03-05 LAB — TROPONIN I (HIGH SENSITIVITY)
Troponin I (High Sensitivity): 10 ng/L (ref <18)
Troponin I (High Sensitivity): 11 ng/L (ref <18)

## 2023-03-05 MED ORDER — ACETAMINOPHEN 325 MG PO TABS: 650.0000 mg | ORAL_TABLET | Freq: Four times a day (QID) | ORAL | Status: DC | PRN

## 2023-03-05 MED ORDER — ONDANSETRON HCL 4 MG PO TABS: 4.0000 mg | ORAL_TABLET | Freq: Four times a day (QID) | ORAL | Status: DC | PRN

## 2023-03-05 MED ORDER — BISACODYL 5 MG PO TBEC
5.0000 mg | DELAYED_RELEASE_TABLET | Freq: Every day | ORAL | Status: DC | PRN
  Administered 2023-03-07: 5 mg via ORAL
  Filled 2023-03-05: qty 1

## 2023-03-05 MED ORDER — ACETAMINOPHEN 650 MG RE SUPP: 650.0000 mg | Freq: Four times a day (QID) | RECTAL | Status: DC | PRN

## 2023-03-05 MED ORDER — IRBESARTAN 75 MG PO TABS
75.0000 mg | ORAL_TABLET | Freq: Every day | ORAL | Status: DC
  Administered 2023-03-05 – 2023-03-08 (×4): 75 mg via ORAL
  Filled 2023-03-05 (×4): qty 1

## 2023-03-05 MED ORDER — ROSUVASTATIN CALCIUM 20 MG PO TABS
20.0000 mg | ORAL_TABLET | Freq: Every day | ORAL | Status: DC
  Administered 2023-03-05 – 2023-03-07 (×3): 20 mg via ORAL
  Filled 2023-03-05 (×2): qty 1
  Filled 2023-03-05 (×3): qty 2
  Filled 2023-03-05: qty 1

## 2023-03-05 MED ORDER — ENOXAPARIN SODIUM 30 MG/0.3ML IJ SOSY
30.0000 mg | PREFILLED_SYRINGE | INTRAMUSCULAR | Status: DC
  Administered 2023-03-05 – 2023-03-07 (×3): 30 mg via SUBCUTANEOUS
  Filled 2023-03-05 (×3): qty 0.3

## 2023-03-05 MED ORDER — RISPERIDONE 0.25 MG PO TABS
0.2500 mg | ORAL_TABLET | Freq: Every day | ORAL | Status: DC
  Administered 2023-03-05 – 2023-03-07 (×3): 0.25 mg via ORAL
  Filled 2023-03-05 (×3): qty 1

## 2023-03-05 MED ORDER — OXYCODONE HCL 5 MG PO TABS
2.5000 mg | ORAL_TABLET | Freq: Once | ORAL | Status: AC
  Administered 2023-03-05: 2.5 mg via ORAL
  Filled 2023-03-05: qty 1

## 2023-03-05 MED ORDER — ASPIRIN 81 MG PO TBEC
81.0000 mg | DELAYED_RELEASE_TABLET | Freq: Every day | ORAL | Status: DC
  Administered 2023-03-05 – 2023-03-07 (×3): 81 mg via ORAL
  Filled 2023-03-05 (×3): qty 1

## 2023-03-05 MED ORDER — CITALOPRAM HYDROBROMIDE 20 MG PO TABS
20.0000 mg | ORAL_TABLET | Freq: Every day | ORAL | Status: DC
  Administered 2023-03-06 – 2023-03-08 (×3): 20 mg via ORAL
  Filled 2023-03-05 (×2): qty 2
  Filled 2023-03-05 (×2): qty 1
  Filled 2023-03-05: qty 2
  Filled 2023-03-05: qty 1

## 2023-03-05 MED ORDER — ONDANSETRON HCL 4 MG/2ML IJ SOLN: 4.0000 mg | Freq: Four times a day (QID) | INTRAMUSCULAR | Status: DC | PRN

## 2023-03-05 MED ORDER — SODIUM CHLORIDE 0.9 % IV BOLUS
1000.0000 mL | Freq: Once | INTRAVENOUS | Status: AC
  Administered 2023-03-05: 1000 mL via INTRAVENOUS

## 2023-03-05 MED ORDER — SODIUM CHLORIDE 0.9 % IV SOLN: Freq: Once | INTRAVENOUS | Status: AC

## 2023-03-05 MED ORDER — MIRTAZAPINE 15 MG PO TABS
7.5000 mg | ORAL_TABLET | Freq: Every day | ORAL | Status: DC
  Administered 2023-03-05 – 2023-03-07 (×3): 7.5 mg via ORAL
  Filled 2023-03-05 (×3): qty 1

## 2023-03-05 MED ORDER — HYDROCODONE-ACETAMINOPHEN 5-325 MG PO TABS
1.0000 | ORAL_TABLET | ORAL | Status: DC | PRN
  Administered 2023-03-06 – 2023-03-07 (×2): 1 via ORAL
  Filled 2023-03-05 (×2): qty 1

## 2023-03-05 MED ORDER — MAGNESIUM OXIDE 400 MG PO TABS
400.0000 mg | ORAL_TABLET | Freq: Every day | ORAL | Status: DC
  Administered 2023-03-05 – 2023-03-08 (×4): 400 mg via ORAL
  Filled 2023-03-05 (×7): qty 1

## 2023-03-05 MED ORDER — IOHEXOL 300 MG/ML  SOLN
75.0000 mL | Freq: Once | INTRAMUSCULAR | Status: AC | PRN
  Administered 2023-03-05: 75 mL via INTRAVENOUS

## 2023-03-05 MED ORDER — ACETAMINOPHEN 500 MG PO TABS
1000.0000 mg | ORAL_TABLET | Freq: Once | ORAL | Status: AC
  Administered 2023-03-05: 1000 mg via ORAL
  Filled 2023-03-05: qty 2

## 2023-03-05 MED ORDER — SENNOSIDES-DOCUSATE SODIUM 8.6-50 MG PO TABS
1.0000 | ORAL_TABLET | Freq: Every evening | ORAL | Status: DC | PRN
  Administered 2023-03-07: 1 via ORAL
  Filled 2023-03-05: qty 1

## 2023-03-05 NOTE — ED Provider Notes (Signed)
[facility] Provider Note    Event Date/Time   First MD Initiated Contact with Patient 03/05/23 [phone]     (approximate)   History   Chest Pain   HPI  Audrey Duran is a 88 y.o. female here with chest pain.  History still limited due to dementia.  However, per her report, she states that she fell in the shower last week.  She states she was getting wet tiles up off the ground.  She states she fell onto her buttocks.  However, since then, she has had persistent, aching, stabbing, lower back pain as well as right lower chest wall pain.  The pain is worse with any movement or palpation.  She subsequent presents for evaluation.  She currently lives in assisted living per her report.  No fevers or chills.     Physical Exam   Triage Vital Signs: ED Triage Vitals  Encounter Vitals Group     BP 03/05/23 0933 (!) 136/58     Systolic BP Percentile --      Diastolic BP Percentile --      Pulse Rate 03/05/23 0933 64     Resp 03/05/23 0933 16     Temp 03/05/23 0933 98.6 F (37 C)     Temp Source 03/05/23 0933 Oral     SpO2 03/05/23 0927 96 %     Weight 03/05/23 0933 120 lb (54.4 kg)     Height 03/05/23 0933 5\' 3"  (1.6 m)     Head Circumference --      Peak Flow --      Pain Score 03/05/23 0932 5     Pain Loc --      Pain Education --      Exclude from Growth Chart --     Most recent vital signs: Vitals:   03/05/23 1600 03/05/23 1638  BP: 132/74 (!) 150/51  Pulse:  61  Resp: 20 17  Temp: 98.5 F (36.9 C) 98.2 F (36.8 C)  SpO2: 99% 96%     General: Awake, no distress.  CV:  Good peripheral perfusion.  Regular rate and rhythm. Resp:  Normal work of breathing.  Lungs clear. Abd:  No distention.  No significant tenderness to palpation. Other:  No obvious head trauma.  Mild tenderness to palpation of the right lower chest wall, no bruising or deformity.  Moderate paraspinal and midline lumbar pain.  Strength out of 5 bilateral upper and lower  extremities.   ED Results / Procedures / Treatments   Labs (all labs ordered are listed, but only abnormal results are displayed) Labs Reviewed  BASIC METABOLIC PANEL - Abnormal; Notable for the following components:      Result Value   Potassium 5.5 (*)    CO2 20 (*)    Glucose, Bld 116 (*)    BUN 39 (*)    Creatinine, Ser 1.33 (*)    GFR, Estimated 39 (*)    All other components within normal limits  CBC - Abnormal; Notable for the following components:   WBC 11.0 (*)    RBC 3.77 (*)    Hemoglobin 11.5 (*)    HCT 35.0 (*)    All other components within normal limits  URINALYSIS, ROUTINE W REFLEX MICROSCOPIC - Abnormal; Notable for the following components:   Color, Urine YELLOW (*)    APPearance CLEAR (*)    All other components within normal limits  HEPATIC FUNCTION PANEL - Abnormal; Notable for the following  components:   Albumin 3.3 (*)    All other components within normal limits  TROPONIN I (HIGH SENSITIVITY)  TROPONIN I (HIGH SENSITIVITY)     EKG Normal sinus rhythm, ventricular rate 61.  PR 174, QRS 88, QTc 396.  No acute ST elevations or signs of ischemia or infarct.   RADIOLOGY Chest x-ray: Clear CT head/C-spine: No acute abnormality CT abdomen/pelvis: T11 compression fracture with 50% height loss CT L-spine: T11 compression fracture   I also independently reviewed and agree with radiologist interpretations.   PROCEDURES:  Critical Care performed: No   MEDICATIONS ORDERED IN ED: Medications  aspirin EC tablet 81 mg (has no administration in time range)  rosuvastatin (CRESTOR) tablet 20 mg (has no administration in time range)  irbesartan (AVAPRO) tablet 75 mg (has no administration in time range)  citalopram (CELEXA) tablet 20 mg (has no administration in time range)  mirtazapine (REMERON) tablet 7.5 mg (has no administration in time range)  risperiDONE (RISPERDAL) tablet 0.25 mg (has no administration in time range)  magnesium oxide (MAG-OX)  tablet 400 mg (has no administration in time range)  enoxaparin (LOVENOX) injection 30 mg (has no administration in time range)  acetaminophen (TYLENOL) tablet 650 mg (has no administration in time range)    Or  acetaminophen (TYLENOL) suppository 650 mg (has no administration in time range)  HYDROcodone-acetaminophen (NORCO/VICODIN) 5-325 MG per tablet 1-2 tablet (has no administration in time range)  ondansetron (ZOFRAN) tablet 4 mg (has no administration in time range)    Or  ondansetron (ZOFRAN) injection 4 mg (has no administration in time range)  senna-docusate (Senokot-S) tablet 1 tablet (has no administration in time range)  bisacodyl (DULCOLAX) EC tablet 5 mg (has no administration in time range)  iohexol (OMNIPAQUE) 300 MG/ML solution 75 mL (75 mLs Intravenous Contrast Given 03/05/23 1037)  sodium chloride 0.9 % bolus 1,000 mL (1,000 mLs Intravenous New Bag/Given 03/05/23 1310)  acetaminophen (TYLENOL) tablet 1,000 mg (1,000 mg Oral Given 03/05/23 1308)  oxyCODONE (Oxy IR/ROXICODONE) immediate release tablet 2.5 mg (2.5 mg Oral Given 03/05/23 1454)  0.9 %  sodium chloride infusion ( Intravenous New Bag/Given 03/05/23 1646)     IMPRESSION / MDM / ASSESSMENT AND PLAN / ED COURSE  I reviewed the triage vital signs and the nursing notes.                              Differential diagnosis includes, but is not limited to, fall, thoracic or lumbar compression fracture, chest pain/occult rib injury, occult intra-abdominal injury, UTI  Patient's presentation is most consistent with acute presentation with potential threat to life or bodily function.  The patient is on the cardiac monitor to evaluate for evidence of arrhythmia and/or significant heart rate changes   88 year old female here with back and side pain after fall several days ago.  Patient neurovascularly intact distally.  She is hemodynamically stable.  Lab work is overall fairly reassuring although she does appear dehydrated with  elevated BUN/creatinine ratio compared to baseline and a potassium of 5.5.  EKG is nonischemic.  CT imaging obtained given the areas of her pain, and shows a T11 compression fracture with 50% height loss.  She has no signs of distal neurological compromise.  Patient is in a moderate amount of pain.  She will need pain control and PT.  Discussed the case with neurosurgery, will order TLSO.  Will plan to admit to medicine as the patient  is currently in assisted living and unable to manage her pain and symptoms at her current level of care.   FINAL CLINICAL IMPRESSION(S) / ED DIAGNOSES   Final diagnoses:  Compression fracture of thoracic vertebra, unspecified thoracic vertebral level, initial encounter (HCC)     Rx / DC Orders   ED Discharge Orders     None        Note:  This document was prepared using Dragon voice recognition software and may include unintentional dictation errors.   Shaune Pollack, MD 03/05/23 2009

## 2023-03-05 NOTE — Progress Notes (Signed)
Reached out to pharmacy regarding medications and verification. Pharmacy response at 1821 was "Should be verified shortly, working on med rec now, thanks!". Will pass along to oncoming shift.

## 2023-03-05 NOTE — Progress Notes (Signed)
Orthopedic Tech Progress Note Patient Details:  DELZORA ADOLF 1935-05-23 [id number]  Patient ID: Jeanann Lewandowsky, female   DOB: 05/30/1935, 88 y.o.   MRN: [id number] Order placed with hanger for TLSO. [place] A Gerilyn Pilgrim 03/05/2023, 1:47 PM

## 2023-03-05 NOTE — Progress Notes (Signed)
Neurosurgery brief note Was contacted by the emergency room regarding this patient.  She apparently is an 88 year old female who had presented after a fall from her assisted living facility.  She was complaining of midthoracic mild back pain.  She per report is neurologically intact without any loss of bowel or bladder function or sensory findings. CT scan of the lumbar region on the runoff demonstrated what appears to be a T11 approximately 25% loss of height compression fracture no spinal canal encroachment good alignment.  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  She does not need the brace for showering or when she is in bed  We will arrange outpatient neurosurgery follow-up  Melina Modena, MD Neurosurgery

## 2023-03-05 NOTE — ED Triage Notes (Addendum)
Patient to ED via [facility] from [facility] assisted living. Patient complaining of left sided chest pain since 0630 today. Patient is not currently taking blood thinners. Patient also had an unwitnessed fall on Saturday. Patient is now complaining of right sided flank pain. EMS stated patient has had decreased appetite. Patient has Hx of dementia.

## 2023-03-05 NOTE — Consult Note (Signed)
PHARMACIST - PHYSICIAN COMMUNICATION  CONCERNING:  Enoxaparin (Lovenox) for DVT Prophylaxis    RECOMMENDATION: Patient was prescribed enoxaprin 40mg  q24 hours for VTE prophylaxis.   Filed Weights   03/05/23 0933  Weight: 54.4 kg (120 lb)    Body mass index is 21.26 kg/m.  Estimated Creatinine Clearance: 25.6 mL/min (A) (by C-G formula based on SCr of 1.33 mg/dL (H)).  Patient is candidate for enoxaparin 30mg  every 24 hours based on CrCl <20ml/min  DESCRIPTION: Pharmacy has adjusted enoxaparin dose per [facility] policy.  Patient is now receiving enoxaparin 30 mg every 24 hours   Celene Squibb, PharmD Clinical Pharmacist 03/05/2023 4:08 PM

## 2023-03-05 NOTE — H&P (Signed)
History and Physical    Audrey WILLAUER Duran:[id number] DOB: [birth year] DOA: 03/05/2023  PCP: Audrey Shams, MD (Confirm with patient/family/NH records and if not entered, this has to be entered at [facility] point of entry) Patient coming from: Assisted living  I have personally briefly reviewed patient's old medical records in [facility]  Chief Complaint: Back pain  HPI: Audrey Duran is a 88 y.o. female with medical history significant of osteoporosis, COPD, CAD/MI, dementia, CKD stage IIIa, PAF off Coumadin in 2013, presented with worsening of back pain and ambulation difficulties.  Patient moved to live in assisted living about 2 weeks ago.  She had an episode of fall about 5 days ago in the bathroom which she attributed to " the cement floor caught my foot" and she fell backward and hit her tailbone.  Denies any head or neck injury, no LOC.  And she was able to stand up and walk again but starting next day she started to feel severe back pain, nonradiating, constant and to a point she had to use her both hands to hold her back to walk to relieve the pain.  Denies any urine or bowel movement problems, denies any numbness weakness of any of the limbs.  Having been struggled for 4 days she decided to come to hospital today.  Denies any chest pain abdominal pain urinary problems or diarrhea no cough denies any skin rash or open wound. ED Course: Afebrile, no tachycardia no hypotension CT thoracic spine showed compression fraction T11 30% vertebral body height loss no definitive involvement of the posterior elements.  Image was reviewed by neurosurgery and recommended conservative management with TLSO  Review of Systems: As per HPI otherwise 14 point review of systems negative.    Past Medical History:  Diagnosis Date   Anemia    Atrial fibrillation (HCC)    COPD (chronic obstructive pulmonary disease) (HCC)    Coronary artery disease 1995   s/p AMI , no history of stents,   [name]   Hyperlipidemia    Ischemic cardiomyopathy Dec 2011   ETT Sestamibi study apical scar, no ischemia, [name]   Myocardial infarction (HCC)    Recurrent HSV (herpes simplex virus)     Past Surgical History:  Procedure Laterality Date   ABDOMINAL HYSTERECTOMY  june 2014   BREAST EXCISIONAL BIOPSY Left 1990's   neg   CAROTID ENDARTERECTOMY  July 2007   Dew, right carotid   CATARACT EXTRACTION  2004, 2006   JOINT REPLACEMENT  July 2013   total hip , [place]   LAPAROSCOPIC APPENDECTOMY N/A 02/20/2020   Procedure: APPENDECTOMY LAPAROSCOPIC;  Surgeon: Leafy Ro, MD;  Location: [facility] ORS;  Service: General;  Laterality: N/A;     reports that she quit smoking about 31 years ago. Her smoking use included cigarettes. She has never used smokeless tobacco. She reports that she does not drink alcohol and does not use drugs.  Allergies  Allergen Reactions   Azithromycin     GI upset   Clarithromycin     ? Rash   Diclofenac     ? Rash   Lisinopril Other (See Comments)    hyperkalemia   Meloxicam     ? Rash   Nitrofurantoin Monohyd Macro     ? rash   Zocor [Simvastatin]    Penicillins Rash    Family History  Problem Relation Age of Onset   Cancer Mother 78       Pancreatic  Heart disease Father    Cancer Sister 33       Breast Cancer   Breast cancer Sister 1   Multiple sclerosis Daughter      Prior to Admission medications   Medication Sig Start Date End Date Taking? Authorizing Provider  alendronate (FOSAMAX) 70 MG tablet Take 1 tablet by mouth every 7 days on an empty stomach with a full glass of water 04/27/22   Audrey Shams, MD  aspirin 81 MG tablet Take 81 mg by mouth at bedtime.    [provider]  Cholecalciferol (VITAMIN D3) 75 MCG (3000 UT) TABS Take 1 tablet by mouth daily. Take one by mouth daily 04/27/22   Audrey Shams, MD  citalopram (CELEXA) 20 MG tablet Take 1 tablet (20 mg total) by mouth daily. 01/11/23   Audrey Shams, MD   cyanocobalamin 1000 MCG tablet Take one by mouth daily    [provider]  magnesium oxide (MAG-OX) 400 MG tablet Take 400 mg by mouth daily.    [provider]  mirtazapine (REMERON) 7.5 MG tablet Take 1 tablet (7.5 mg total) by mouth at bedtime. 10/12/22 01/10/23  Chesley Noon, MD  risperiDONE (RISPERDAL) 0.25 MG tablet Take 1 tablet (0.25 mg total) by mouth at bedtime. 10/12/22 01/10/23  Chesley Noon, MD  rosuvastatin (CRESTOR) 20 MG tablet Take 1 tablet (20 mg total) by mouth daily. 04/27/22   Audrey Shams, MD  telmisartan (MICARDIS) 20 MG tablet Take 1 tablet (20 mg total) by mouth daily. 10/12/22 01/10/23  Chesley Noon, MD    Physical Exam: Vitals:   03/05/23 0933 03/05/23 1300 03/05/23 1451 03/05/23 1600  BP: (!) 136/58 (!) 152/51 (!) 148/46 132/74  Pulse: 64 62 (!) 52   Resp: 20 (!) 25 18 20   Temp: 98.6 F (37 C)  98.2 F (36.8 C) 98.5 F (36.9 C)  TempSrc: Oral  Oral Oral  SpO2: 95% 97% 96% 99%  Weight: 54.4 kg     Height: 5\' 3"  (1.6 m)       Constitutional: NAD, calm, comfortable Vitals:   03/05/23 0933 03/05/23 1300 03/05/23 1451 03/05/23 1600  BP: (!) 136/58 (!) 152/51 (!) 148/46 132/74  Pulse: 64 62 (!) 52   Resp: 20 (!) 25 18 20   Temp: 98.6 F (37 C)  98.2 F (36.8 C) 98.5 F (36.9 C)  TempSrc: Oral  Oral Oral  SpO2: 95% 97% 96% 99%  Weight: 54.4 kg     Height: 5\' 3"  (1.6 m)      Eyes: PERRL, lids and conjunctivae normal ENMT: Mucous membranes are moist. Posterior pharynx clear of any exudate or lesions.Normal dentition.  Neck: normal, supple, no masses, no thyromegaly Respiratory: clear to auscultation bilaterally, no wheezing, no crackles. Normal respiratory effort. No accessory muscle use.  Cardiovascular: Regular rate and rhythm, no murmurs / rubs / gallops. No extremity edema. 2+ pedal pulses. No carotid bruits.  Abdomen: no tenderness, no masses palpated. No hepatosplenomegaly. Bowel sounds positive.  Musculoskeletal:  Tenderness on T11 area Skin: no rashes, lesions, ulcers. No induration Neurologic: CN 2-12 grossly intact. Sensation intact, DTR normal. Strength 5/5 in all 4.  Psychiatric: Normal judgment and insight. Alert and oriented x 3. Normal mood.     Labs on Admission: I have personally reviewed following labs and imaging studies  CBC: Recent Labs  Lab 03/05/23 0945  WBC 11.0*  HGB 11.5*  HCT 35.0*  MCV 92.8  PLT 178   Basic Metabolic Panel: Recent  Labs  Lab 03/05/23 0945  NA 136  K 5.5*  CL 108  CO2 20*  GLUCOSE 116*  BUN 39*  CREATININE 1.33*  CALCIUM 9.6   GFR: Estimated Creatinine Clearance: 25.6 mL/min (A) (by C-G formula based on SCr of 1.33 mg/dL (H)). Liver Function Tests: Recent Labs  Lab 03/05/23 0945  AST 15  ALT 10  ALKPHOS 64  BILITOT 0.7  PROT 6.8  ALBUMIN 3.3*   No results for input(s): "LIPASE", "AMYLASE" in the last 168 hours. No results for input(s): "AMMONIA" in the last 168 hours. Coagulation Profile: No results for input(s): "INR", "PROTIME" in the last 168 hours. Cardiac Enzymes: No results for input(s): "CKTOTAL", "CKMB", "CKMBINDEX", "TROPONINI" in the last 168 hours. BNP (last 3 results) No results for input(s): "PROBNP" in the last 8760 hours. HbA1C: No results for input(s): "HGBA1C" in the last 72 hours. CBG: No results for input(s): "GLUCAP" in the last 168 hours. Lipid Profile: No results for input(s): "CHOL", "HDL", "LDLCALC", "TRIG", "CHOLHDL", "LDLDIRECT" in the last 72 hours. Thyroid Function Tests: No results for input(s): "TSH", "T4TOTAL", "FREET4", "T3FREE", "THYROIDAB" in the last 72 hours. Anemia Panel: No results for input(s): "VITAMINB12", "FOLATE", "FERRITIN", "TIBC", "IRON", "RETICCTPCT" in the last 72 hours. Urine analysis:    Component Value Date/Time   COLORURINE YELLOW (A) 03/05/2023 1111   APPEARANCEUR CLEAR (A) 03/05/2023 1111   APPEARANCEUR Cloudy 02/17/2012 0153   LABSPEC 1.018 03/05/2023 1111   LABSPEC  1.011 02/17/2012 0153   PHURINE 5.0 03/05/2023 1111   GLUCOSEU NEGATIVE 03/05/2023 1111   GLUCOSEU NEGATIVE 12/03/2015 0938   HGBUR NEGATIVE 03/05/2023 1111   BILIRUBINUR NEGATIVE 03/05/2023 1111   BILIRUBINUR negative 10/11/2018 1425   BILIRUBINUR Negative 02/17/2012 0153   KETONESUR NEGATIVE 03/05/2023 1111   PROTEINUR NEGATIVE 03/05/2023 1111   UROBILINOGEN 0.2 10/11/2018 1425   UROBILINOGEN 0.2 12/03/2015 0938   NITRITE NEGATIVE 03/05/2023 1111   LEUKOCYTESUR NEGATIVE 03/05/2023 1111   LEUKOCYTESUR 3+ 02/17/2012 0153    Radiological Exams on Admission: CT Thoracic Spine Wo Contrast  Result Date: 03/05/2023 CLINICAL DATA:  Back trauma. EXAM: CT THORACIC SPINE WITHOUT CONTRAST TECHNIQUE: Multidetector CT images of the thoracic were obtained using the standard protocol without intravenous contrast. RADIATION DOSE REDUCTION: This exam was performed according to the departmental dose-optimization program which includes automated exposure control, adjustment of the mA and/or kV according to patient size and/or use of iterative reconstruction technique. COMPARISON:  CT cervical spine and lumbar spine earlier the same date. Thoracic spine radiographs 08/10/2019. Abdominal CT 02/20/2020 FINDINGS: Alignment: Normal Vertebrae: As noted on earlier abdominal CT, there is a biconcave compression fracture at T11 resulting in approximately 30% loss of vertebral body height. This is new from 2021 and likely acute. There is mild osseous retropulsion without definite involvement of the posterior elements. No other acute fractures are identified. Paraspinal and other soft tissues: Mild paraspinous edema at T11 supporting the probable acute nature of this fracture. There is also some subcutaneous edema in the lower back near the thoracolumbar junction. No focal paraspinal or epidural hematoma. Atherosclerosis of the aorta, great vessels and coronary arteries. Mild emphysematous changes are noted in the lungs.  There is mild biapical scarring and mild dependent atelectasis bilaterally. Disc levels: The thoracic disc heights are preserved. There is mild multilevel paraspinal osteophyte formation without evidence of large disc herniation, significant spinal stenosis or significant foraminal narrowing IMPRESSION: 1. Acute biconcave compression fracture at T11 with approximately 30% loss of vertebral body height and mild osseous  retropulsion. No definite involvement of the posterior elements. 2. No other acute osseous findings. 3. No evidence of large disc herniation, significant spinal stenosis or significant foraminal narrowing. 4.  Aortic Atherosclerosis (ICD10-I70.0). Electronically Signed   By: Carey Bullocks M.D.   On: 03/05/2023 15:14   CT ABDOMEN PELVIS W CONTRAST  Result Date: 03/05/2023 CLINICAL DATA:  Abdominal trauma, blunt . Patient complaining of left sided chest pain since 0630 today. Patient is not currently taking blood thinners. Patient also had an unwitnessed fall on Saturday. EXAM: CT ABDOMEN AND PELVIS WITH CONTRAST TECHNIQUE: Multidetector CT imaging of the abdomen and pelvis was performed using the standard protocol following bolus administration of intravenous contrast. RADIATION DOSE REDUCTION: This exam was performed according to the departmental dose-optimization program which includes automated exposure control, adjustment of the mA and/or kV according to patient size and/or use of iterative reconstruction technique. CONTRAST:  75mL OMNIPAQUE IOHEXOL 300 MG/ML  SOLN COMPARISON:  CT abdomen pelvis 02/20/2020 FINDINGS: Lower chest: Artery calcification. Bilateral lower lobe atelectasis. Hepatobiliary: Not enlarged. No focal lesion. No laceration or subcapsular hematoma. The gallbladder is otherwise unremarkable with no radio-opaque gallstones. No biliary ductal dilatation. Pancreas: Normal pancreatic contour. No main pancreatic duct dilatation. Spleen: Not enlarged. No focal lesion. No laceration,  subcapsular hematoma, or vascular injury. Adrenals/Urinary Tract: No nodularity bilaterally. Bilateral kidneys enhance symmetrically. No hydronephrosis. No contusion, laceration, or subcapsular hematoma. Fluid density lesion of the right kidney likely represents a simple renal cyst. Simple renal cysts, in the absence of clinically indicated signs/symptoms, require no independent follow-up. No injury to the vascular structures or collecting systems. No hydroureter. The urinary bladder is unremarkable. On delayed imaging, there is no urothelial wall thickening and there are no filling defects in the opacified portions of the bilateral collecting systems or ureters. Stomach/Bowel: No small or large bowel wall thickening or dilatation. Stool throughout the ascending and proximal transverse colon. Colonic diverticulosis. Status post appendectomy. Vasculature/Lymphatics: Severe atherosclerotic plaque. No abdominal aorta or iliac aneurysm. No active contrast extravasation or pseudoaneurysm. No abdominal, pelvic, inguinal lymphadenopathy. Reproductive: Normal. Other: No simple free fluid ascites. No pneumoperitoneum. No hemoperitoneum. No mesenteric hematoma identified. No organized fluid collection. Musculoskeletal: Mid lower back subcutaneus soft tissue edema and hematoma formation (4:366, 7:55). Surgical hardware fixation of the right femur partially visualized. Diffuse decreased bone density. No acute pelvic fracture. No sacral fracture. Age-indeterminate T11 compression fracture. Please see separately dictated CT lumbar spine 03/05/2023. Ports and Devices: None. IMPRESSION: 1. Mid lower back subcutaneus soft tissue edema and hematoma formation. 2. No acute intra-abdominal, intrapelvic traumatic injury. 3. Age-indeterminate T11 compression fracture with 50% vertebral body height loss. Correlate with point tenderness to palpation to evaluate for an acute component at the T11 level. 4. Please see separately dictated CT  lumbar spine 03/05/2023. 5. Other imaging findings of potential clinical significance: Colonic diverticulosis with no acute diverticulitis. Aortic Atherosclerosis (ICD10-I70.0) -severe including coronary artery calcification. Electronically Signed   By: Tish Frederickson M.D.   On: 03/05/2023 11:52   CT HEAD WO CONTRAST ( )  Result Date: 03/05/2023 CLINICAL DATA:  Head trauma, moderate-severe; Neck trauma (Age >= 65y). Unwitnessed fall EXAM: CT HEAD WITHOUT CONTRAST CT CERVICAL SPINE WITHOUT CONTRAST TECHNIQUE: Multidetector CT imaging of the head and cervical spine was performed following the standard protocol without intravenous contrast. Multiplanar CT image reconstructions of the cervical spine were also generated. RADIATION DOSE REDUCTION: This exam was performed according to the departmental dose-optimization program which includes automated exposure control, adjustment of the mA  and/or kV according to patient size and/or use of iterative reconstruction technique. COMPARISON:  CT head 04/13/2015, CT head 01/13/2020 FINDINGS: CT HEAD FINDINGS Brain: Cerebral ventricle sizes are concordant with the degree of cerebral volume loss. Patchy and confluent areas of decreased attenuation are noted throughout the deep and periventricular white matter of the cerebral hemispheres bilaterally, compatible with chronic microvascular ischemic disease. No evidence of large-territorial acute infarction. No parenchymal hemorrhage. No mass lesion. No extra-axial collection. No mass effect or midline shift. No hydrocephalus. Basilar cisterns are patent. Vascular: No hyperdense vessel. Atherosclerotic calcifications are present within the cavernous internal carotid arteries. Skull: No acute fracture or focal lesion. Sinuses/Orbits: Paranasal sinuses and mastoid air cells are clear. The orbits are unremarkable. Other: None. CT CERVICAL SPINE FINDINGS Alignment: Grade 1 anterolisthesis of C5 on C6. Skull base and vertebrae: C6-C7  moderate degenerative changes of the spine. At least moderate bilateral osseous neural foraminal stenosis at the C6-C7 level. No severe osseous central canal stenosis. No acute fracture. No aggressive appearing focal osseous lesion or focal pathologic process. Soft tissues and spinal canal: No prevertebral fluid or swelling. No visible canal hematoma. Upper chest: Unremarkable. Other: Atherosclerotic plaque of the carotid arteries within the neck. IMPRESSION: 1. No acute intracranial abnormality. 2. No acute displaced fracture or traumatic listhesis of the cervical spine. 3. Degenerative changes with at least moderate bilateral osseous neural foraminal stenosis at the C6-C7 level. Electronically Signed   By: Tish Frederickson M.D.   On: 03/05/2023 11:37   CT Cervical Spine Wo Contrast  Result Date: 03/05/2023 CLINICAL DATA:  Head trauma, moderate-severe; Neck trauma (Age >= 65y). Unwitnessed fall EXAM: CT HEAD WITHOUT CONTRAST CT CERVICAL SPINE WITHOUT CONTRAST TECHNIQUE: Multidetector CT imaging of the head and cervical spine was performed following the standard protocol without intravenous contrast. Multiplanar CT image reconstructions of the cervical spine were also generated. RADIATION DOSE REDUCTION: This exam was performed according to the departmental dose-optimization program which includes automated exposure control, adjustment of the mA and/or kV according to patient size and/or use of iterative reconstruction technique. COMPARISON:  CT head 04/13/2015, CT head 01/13/2020 FINDINGS: CT HEAD FINDINGS Brain: Cerebral ventricle sizes are concordant with the degree of cerebral volume loss. Patchy and confluent areas of decreased attenuation are noted throughout the deep and periventricular white matter of the cerebral hemispheres bilaterally, compatible with chronic microvascular ischemic disease. No evidence of large-territorial acute infarction. No parenchymal hemorrhage. No mass lesion. No extra-axial  collection. No mass effect or midline shift. No hydrocephalus. Basilar cisterns are patent. Vascular: No hyperdense vessel. Atherosclerotic calcifications are present within the cavernous internal carotid arteries. Skull: No acute fracture or focal lesion. Sinuses/Orbits: Paranasal sinuses and mastoid air cells are clear. The orbits are unremarkable. Other: None. CT CERVICAL SPINE FINDINGS Alignment: Grade 1 anterolisthesis of C5 on C6. Skull base and vertebrae: C6-C7 moderate degenerative changes of the spine. At least moderate bilateral osseous neural foraminal stenosis at the C6-C7 level. No severe osseous central canal stenosis. No acute fracture. No aggressive appearing focal osseous lesion or focal pathologic process. Soft tissues and spinal canal: No prevertebral fluid or swelling. No visible canal hematoma. Upper chest: Unremarkable. Other: Atherosclerotic plaque of the carotid arteries within the neck. IMPRESSION: 1. No acute intracranial abnormality. 2. No acute displaced fracture or traumatic listhesis of the cervical spine. 3. Degenerative changes with at least moderate bilateral osseous neural foraminal stenosis at the C6-C7 level. Electronically Signed   By: Tish Frederickson M.D.   On: 03/05/2023 11:37  CT L-SPINE NO CHARGE  Result Date: 03/05/2023 CLINICAL DATA:  Patient complaining of left sided chest pain since 0630 today. Patient is not currently taking blood thinners. Patient also had an unwitnessed fall on Saturday EXAM: CT LUMBAR SPINE WITHOUT CONTRAST TECHNIQUE: Multidetector CT imaging of the lumbar spine was performed without intravenous contrast administration. Multiplanar CT image reconstructions were also generated. RADIATION DOSE REDUCTION: This exam was performed according to the departmental dose-optimization program which includes automated exposure control, adjustment of the mA and/or kV according to patient size and/or use of iterative reconstruction technique. COMPARISON:  Chest  x-ray 03/05/2023, chest x-ray 10/11/2022, CT abdomen pelvis 02/20/2020 FINDINGS: Segmentation: 6 lumbar type vertebrae (named L1-L6). Alignment: Normal. Vertebrae: Diffusely decreased bone density. L2-L3 at least moderate degenerative changes. Multilevel mild degenerative changes. No associated severe osseous neural foraminal or central canal stenosis. No acute fracture or focal pathologic process. Paraspinal and other soft tissues: Negative. Disc levels: Intervertebral disc space vacuum phenomenon at the L2-L3 level. Other: Interval development of an age-indeterminate T11 vertebral body fracture with at least 50% vertebral body height loss. Please see separately dictated CT abdomen pelvis 03/05/2023. Severe atherosclerotic plaque. IMPRESSION: 1. No acute displaced fracture or traumatic listhesis of the lumbar spine. 2. Interval development of an age-indeterminate T11 vertebral body fracture with at least 50% vertebral body height loss. Correlate with point tenderness to palpation to evaluate for an acute component. 3. Aortic Atherosclerosis (ICD10-I70.0). Electronically Signed   By: Tish Frederickson M.D.   On: 03/05/2023 11:32   DG Chest 2 View  Result Date: 03/05/2023 CLINICAL DATA:  Chest pain. EXAM: CHEST - 2 VIEW COMPARISON:  Chest radiographs 10/11/2022 and 01/14/2020 FINDINGS: The cardiac silhouette is borderline enlarged. Aortic atherosclerosis is noted. Chronic interstitial coarsening is similar to the prior study. No acute airspace consolidation, overt pulmonary edema, or pneumothorax is identified. There is mild blunting of the right lateral costophrenic angle without a pleural effusion present on today's abdominal CT. A T11 compression fracture is new from 2021 with mild-to-moderate anterior vertebral body height loss. IMPRESSION: 1. Chronic lung changes without evidence of acute cardiopulmonary disease. 2. T11 compression fracture which will be more fully evaluated on pending lumbar spine CT.  Electronically Signed   By: Sebastian Ache M.D.   On: 03/05/2023 10:59    EKG: Independently reviewed.  Sinus rhythm, no acute ST changes.  Assessment/Plan Principal Problem:   Impaired ambulation Active Problems:   Compression fracture of T11 vertebra (HCC)  (please populate well all problems here in Problem List. (For example, if patient is on BP meds at home and you resume or decide to hold them, it is a problem that needs to be her. Same for CAD, COPD, HLD and so on)  Acute ambulation dysfunction T11 compression fracture, acute -Secondary to mechanical fall -No significant focal neurological deficit -Case reviewed by neurosurgery who recommended conservative management versus pain control and TLSO -Pain control with alternate Tylenol and hydrocodone -PT evaluation -Continue her baseline osteoporosis treatment of alendronate  COPD -Stable, continue as needed breathing meds  HTN -Stable, continue ARB  CKD stage IIIa -Euvolemic and creatinine level stable  History of PAF -In sinus rhythm, off Coumadin since 2013 with unclear reason -Continue aspirin  DVT prophylaxis: Lovenox Code Status: Full code Family Communication: ED physician discussed with patient's son over the phone Disposition Plan: Expect less than 2 midnight hospital stay Consults called: Neurosurgery Admission status: MedSurg observation   Emeline General MD [facility] Pager [phone]  03/05/2023, 4:22  PM

## 2023-03-06 DIAGNOSIS — N1831 Chronic kidney disease, stage 3a: Secondary | ICD-10-CM | POA: Diagnosis not present

## 2023-03-06 DIAGNOSIS — S22080A Wedge compression fracture of T11-T12 vertebra, initial encounter for closed fracture: Secondary | ICD-10-CM

## 2023-03-06 DIAGNOSIS — R262 Difficulty in walking, not elsewhere classified: Secondary | ICD-10-CM | POA: Diagnosis not present

## 2023-03-06 LAB — BASIC METABOLIC PANEL
Anion gap: 5 (ref 5–15)
BUN: 34 mg/dL — ABNORMAL HIGH (ref 8–23)
CO2: 22 mmol/L (ref 22–32)
Calcium: 8.9 mg/dL (ref 8.9–10.3)
Chloride: 108 mmol/L (ref 98–111)
Creatinine, Ser: 1.4 mg/dL — ABNORMAL HIGH (ref 0.44–1.00)
GFR, Estimated: 37 mL/min — ABNORMAL LOW (ref >60)
Glucose, Bld: 98 mg/dL (ref 70–99)
Potassium: 5 mmol/L (ref 3.5–5.1)
Sodium: 135 mmol/L (ref 135–145)

## 2023-03-06 MED ORDER — HYDROCODONE-ACETAMINOPHEN 5-325 MG PO TABS: 1.0000 | ORAL_TABLET | Freq: Four times a day (QID) | ORAL | 0 refills | Status: DC | PRN

## 2023-03-06 MED ORDER — LIDOCAINE 5 % EX OINT: 1.0000 | TOPICAL_OINTMENT | CUTANEOUS | 0 refills | Status: DC | PRN

## 2023-03-06 MED ORDER — TIMOLOL MALEATE 0.5 % OP SOLN
1.0000 [drp] | Freq: Every day | OPHTHALMIC | Status: DC
  Administered 2023-03-07 – 2023-03-08 (×2): 1 [drp] via OPHTHALMIC
  Filled 2023-03-06: qty 5

## 2023-03-06 NOTE — Plan of Care (Signed)

## 2023-03-06 NOTE — Discharge Summary (Signed)
Physician Discharge Summary   Patient: Audrey Duran MRN: [id number] DOB: [birth year]  Admit date:     03/05/2023  Discharge date: 03/06/23  Discharge Physician: Marrion Coy   PCP: Sherlene Shams, MD   Recommendations at discharge:   Follow-up with PCP in 1 week.  Discharge Diagnoses: Principal Problem:   Impaired ambulation Active Problems:   Compression fracture of T11 vertebra (HCC) COPD  Essential hypertension. Paroxysmal atrial fibrillation not on anticoagulation. Chronic kidney disease stage IIIa. Resolved Problems:   * No resolved hospital problems. * Hyperkalemia. Hospital Course: Audrey Duran is a 88 y.o. female with medical history significant of osteoporosis, COPD, CAD/MI, dementia, CKD stage IIIa, PAF off Coumadin in 2013, presented with worsening of back pain and ambulation difficulties.  She had an episode of fall about 5 days ago in the bathroom which she attributed to " the cement floor caught my foot" and she fell backward and hit her tailbone.  CT scan of the thoracic spine showed a compression fracture of T11 with a 30% vertebral body height loss.  Patient was seen by neurosurgery, recommended TLSO, no surgery.  Patient was given as needed pain medicine, she was also seen by PT/OT, she ambulated well.  Currently she is medically stable to be discharged.         Consultants: Neurosurgery. Procedures performed: None  Disposition: Nursing home Diet recommendation:  Discharge Diet Orders (From admission, onward)     Start     Ordered   03/06/23 0000  Diet - low sodium heart healthy        03/06/23 1335           Cardiac diet DISCHARGE MEDICATION: Allergies as of 03/06/2023       Reactions   Azithromycin    GI upset   Clarithromycin    ? Rash   Diclofenac    ? Rash   Lisinopril Other (See Comments)   hyperkalemia   Meloxicam    ? Rash   Nitrofurantoin Monohyd Macro    ? rash   Zocor [simvastatin]    Penicillins Rash         Medication List     TAKE these medications    acyclovir 400 MG tablet Commonly known as: ZOVIRAX Take 400 mg by mouth daily.   alendronate 70 MG tablet Commonly known as: FOSAMAX Take 1 tablet by mouth every 7 days on an empty stomach with a full glass of water   ALPRAZolam 0.5 MG tablet Commonly known as: XANAX Take 0.5 mg by mouth daily.   aspirin 81 MG tablet Take 81 mg by mouth at bedtime.   citalopram 20 MG tablet Commonly known as: CELEXA Take 1 tablet (20 mg total) by mouth daily.   cyanocobalamin 1000 MCG tablet Take one by mouth daily   HYDROcodone-acetaminophen 5-325 MG tablet Commonly known as: NORCO/VICODIN Take 1 tablet by mouth every 6 (six) hours as needed for moderate pain.   lidocaine 5 % ointment Commonly known as: XYLOCAINE Apply 1 Application topically as needed.   magnesium oxide 400 MG tablet Commonly known as: MAG-OX Take 400 mg by mouth daily.   mirtazapine 7.5 MG tablet Commonly known as: REMERON Take 1 tablet (7.5 mg total) by mouth at bedtime.   risperiDONE 0.25 MG tablet Commonly known as: RISPERDAL Take 1 tablet (0.25 mg total) by mouth at bedtime.   rosuvastatin 20 MG tablet Commonly known as: Crestor Take 1 tablet (20 mg total) by mouth daily.  telmisartan 20 MG tablet Commonly known as: MICARDIS Take 1 tablet (20 mg total) by mouth daily.   timolol 0.5 % ophthalmic solution Commonly known as: TIMOPTIC Place 1 drop into both eyes daily.   Vitamin D3 75 MCG (3000 UT) Tabs Take 1 tablet by mouth daily. Take one by mouth daily               Durable Medical Equipment  (From admission, onward)           Start     Ordered   03/06/23 1330  For home use only DME Bedside commode  Once       Comments: 3 in 1  Question:  Patient needs a bedside commode to treat with the following condition  Answer:  Closed wedge compression fracture of T11 vertebra (HCC)   03/06/23 1329            Follow-up Information      Sherlene Shams, MD Follow up in 1 week(s).   Specialty: Internal Medicine Contact information: [address] [place] [address] [phone]                Discharge Exam: Ceasar Mons Weights   03/05/23 0933  Weight: 54.4 kg   General exam: Appears calm and comfortable  Respiratory system: Clear to auscultation. Respiratory effort normal. Cardiovascular system: S1 & S2 heard, RRR. No JVD, murmurs, rubs, gallops or clicks. No pedal edema. Gastrointestinal system: Abdomen is nondistended, soft and nontender. No organomegaly or masses felt. Normal bowel sounds heard. Central nervous system: Alert and oriented. No focal neurological deficits. Extremities: Symmetric 5 x 5 power. Skin: No rashes, lesions or ulcers Psychiatry: Judgement and insight appear normal. Mood & affect appropriate.    Condition at discharge: good  The results of significant diagnostics from this hospitalization (including imaging, microbiology, ancillary and laboratory) are listed below for reference.   Imaging Studies: CT Thoracic Spine Wo Contrast  Result Date: 03/05/2023 CLINICAL DATA:  Back trauma. EXAM: CT THORACIC SPINE WITHOUT CONTRAST TECHNIQUE: Multidetector CT images of the thoracic were obtained using the standard protocol without intravenous contrast. RADIATION DOSE REDUCTION: This exam was performed according to the departmental dose-optimization program which includes automated exposure control, adjustment of the mA and/or kV according to patient size and/or use of iterative reconstruction technique. COMPARISON:  CT cervical spine and lumbar spine earlier the same date. Thoracic spine radiographs 08/10/2019. Abdominal CT 02/20/2020 FINDINGS: Alignment: Normal Vertebrae: As noted on earlier abdominal CT, there is a biconcave compression fracture at T11 resulting in approximately 30% loss of vertebral body height. This is new from 2021 and likely acute. There is mild osseous  retropulsion without definite involvement of the posterior elements. No other acute fractures are identified. Paraspinal and other soft tissues: Mild paraspinous edema at T11 supporting the probable acute nature of this fracture. There is also some subcutaneous edema in the lower back near the thoracolumbar junction. No focal paraspinal or epidural hematoma. Atherosclerosis of the aorta, great vessels and coronary arteries. Mild emphysematous changes are noted in the lungs. There is mild biapical scarring and mild dependent atelectasis bilaterally. Disc levels: The thoracic disc heights are preserved. There is mild multilevel paraspinal osteophyte formation without evidence of large disc herniation, significant spinal stenosis or significant foraminal narrowing IMPRESSION: 1. Acute biconcave compression fracture at T11 with approximately 30% loss of vertebral body height and mild osseous retropulsion. No definite involvement of the posterior elements. 2. No other acute osseous findings. 3. No  evidence of large disc herniation, significant spinal stenosis or significant foraminal narrowing. 4.  Aortic Atherosclerosis (ICD10-I70.0). Electronically Signed   By: Carey Bullocks M.D.   On: 03/05/2023 15:14   CT ABDOMEN PELVIS W CONTRAST  Result Date: 03/05/2023 CLINICAL DATA:  Abdominal trauma, blunt . Patient complaining of left sided chest pain since 0630 today. Patient is not currently taking blood thinners. Patient also had an unwitnessed fall on Saturday. EXAM: CT ABDOMEN AND PELVIS WITH CONTRAST TECHNIQUE: Multidetector CT imaging of the abdomen and pelvis was performed using the standard protocol following bolus administration of intravenous contrast. RADIATION DOSE REDUCTION: This exam was performed according to the departmental dose-optimization program which includes automated exposure control, adjustment of the mA and/or kV according to patient size and/or use of iterative reconstruction technique.  CONTRAST:  75mL OMNIPAQUE IOHEXOL 300 MG/ML  SOLN COMPARISON:  CT abdomen pelvis 02/20/2020 FINDINGS: Lower chest: Artery calcification. Bilateral lower lobe atelectasis. Hepatobiliary: Not enlarged. No focal lesion. No laceration or subcapsular hematoma. The gallbladder is otherwise unremarkable with no radio-opaque gallstones. No biliary ductal dilatation. Pancreas: Normal pancreatic contour. No main pancreatic duct dilatation. Spleen: Not enlarged. No focal lesion. No laceration, subcapsular hematoma, or vascular injury. Adrenals/Urinary Tract: No nodularity bilaterally. Bilateral kidneys enhance symmetrically. No hydronephrosis. No contusion, laceration, or subcapsular hematoma. Fluid density lesion of the right kidney likely represents a simple renal cyst. Simple renal cysts, in the absence of clinically indicated signs/symptoms, require no independent follow-up. No injury to the vascular structures or collecting systems. No hydroureter. The urinary bladder is unremarkable. On delayed imaging, there is no urothelial wall thickening and there are no filling defects in the opacified portions of the bilateral collecting systems or ureters. Stomach/Bowel: No small or large bowel wall thickening or dilatation. Stool throughout the ascending and proximal transverse colon. Colonic diverticulosis. Status post appendectomy. Vasculature/Lymphatics: Severe atherosclerotic plaque. No abdominal aorta or iliac aneurysm. No active contrast extravasation or pseudoaneurysm. No abdominal, pelvic, inguinal lymphadenopathy. Reproductive: Normal. Other: No simple free fluid ascites. No pneumoperitoneum. No hemoperitoneum. No mesenteric hematoma identified. No organized fluid collection. Musculoskeletal: Mid lower back subcutaneus soft tissue edema and hematoma formation (4:366, 7:55). Surgical hardware fixation of the right femur partially visualized. Diffuse decreased bone density. No acute pelvic fracture. No sacral fracture.  Age-indeterminate T11 compression fracture. Please see separately dictated CT lumbar spine 03/05/2023. Ports and Devices: None. IMPRESSION: 1. Mid lower back subcutaneus soft tissue edema and hematoma formation. 2. No acute intra-abdominal, intrapelvic traumatic injury. 3. Age-indeterminate T11 compression fracture with 50% vertebral body height loss. Correlate with point tenderness to palpation to evaluate for an acute component at the T11 level. 4. Please see separately dictated CT lumbar spine 03/05/2023. 5. Other imaging findings of potential clinical significance: Colonic diverticulosis with no acute diverticulitis. Aortic Atherosclerosis (ICD10-I70.0) -severe including coronary artery calcification. Electronically Signed   By: Tish Frederickson M.D.   On: 03/05/2023 11:52   CT HEAD WO CONTRAST ( )  Result Date: 03/05/2023 CLINICAL DATA:  Head trauma, moderate-severe; Neck trauma (Age >= 65y). Unwitnessed fall EXAM: CT HEAD WITHOUT CONTRAST CT CERVICAL SPINE WITHOUT CONTRAST TECHNIQUE: Multidetector CT imaging of the head and cervical spine was performed following the standard protocol without intravenous contrast. Multiplanar CT image reconstructions of the cervical spine were also generated. RADIATION DOSE REDUCTION: This exam was performed according to the departmental dose-optimization program which includes automated exposure control, adjustment of the mA and/or kV according to patient size and/or use of iterative reconstruction technique. COMPARISON:  CT head  04/13/2015, CT head 01/13/2020 FINDINGS: CT HEAD FINDINGS Brain: Cerebral ventricle sizes are concordant with the degree of cerebral volume loss. Patchy and confluent areas of decreased attenuation are noted throughout the deep and periventricular white matter of the cerebral hemispheres bilaterally, compatible with chronic microvascular ischemic disease. No evidence of large-territorial acute infarction. No parenchymal hemorrhage. No mass lesion.  No extra-axial collection. No mass effect or midline shift. No hydrocephalus. Basilar cisterns are patent. Vascular: No hyperdense vessel. Atherosclerotic calcifications are present within the cavernous internal carotid arteries. Skull: No acute fracture or focal lesion. Sinuses/Orbits: Paranasal sinuses and mastoid air cells are clear. The orbits are unremarkable. Other: None. CT CERVICAL SPINE FINDINGS Alignment: Grade 1 anterolisthesis of C5 on C6. Skull base and vertebrae: C6-C7 moderate degenerative changes of the spine. At least moderate bilateral osseous neural foraminal stenosis at the C6-C7 level. No severe osseous central canal stenosis. No acute fracture. No aggressive appearing focal osseous lesion or focal pathologic process. Soft tissues and spinal canal: No prevertebral fluid or swelling. No visible canal hematoma. Upper chest: Unremarkable. Other: Atherosclerotic plaque of the carotid arteries within the neck. IMPRESSION: 1. No acute intracranial abnormality. 2. No acute displaced fracture or traumatic listhesis of the cervical spine. 3. Degenerative changes with at least moderate bilateral osseous neural foraminal stenosis at the C6-C7 level. Electronically Signed   By: Tish Frederickson M.D.   On: 03/05/2023 11:37   CT Cervical Spine Wo Contrast  Result Date: 03/05/2023 CLINICAL DATA:  Head trauma, moderate-severe; Neck trauma (Age >= 65y). Unwitnessed fall EXAM: CT HEAD WITHOUT CONTRAST CT CERVICAL SPINE WITHOUT CONTRAST TECHNIQUE: Multidetector CT imaging of the head and cervical spine was performed following the standard protocol without intravenous contrast. Multiplanar CT image reconstructions of the cervical spine were also generated. RADIATION DOSE REDUCTION: This exam was performed according to the departmental dose-optimization program which includes automated exposure control, adjustment of the mA and/or kV according to patient size and/or use of iterative reconstruction technique.  COMPARISON:  CT head 04/13/2015, CT head 01/13/2020 FINDINGS: CT HEAD FINDINGS Brain: Cerebral ventricle sizes are concordant with the degree of cerebral volume loss. Patchy and confluent areas of decreased attenuation are noted throughout the deep and periventricular white matter of the cerebral hemispheres bilaterally, compatible with chronic microvascular ischemic disease. No evidence of large-territorial acute infarction. No parenchymal hemorrhage. No mass lesion. No extra-axial collection. No mass effect or midline shift. No hydrocephalus. Basilar cisterns are patent. Vascular: No hyperdense vessel. Atherosclerotic calcifications are present within the cavernous internal carotid arteries. Skull: No acute fracture or focal lesion. Sinuses/Orbits: Paranasal sinuses and mastoid air cells are clear. The orbits are unremarkable. Other: None. CT CERVICAL SPINE FINDINGS Alignment: Grade 1 anterolisthesis of C5 on C6. Skull base and vertebrae: C6-C7 moderate degenerative changes of the spine. At least moderate bilateral osseous neural foraminal stenosis at the C6-C7 level. No severe osseous central canal stenosis. No acute fracture. No aggressive appearing focal osseous lesion or focal pathologic process. Soft tissues and spinal canal: No prevertebral fluid or swelling. No visible canal hematoma. Upper chest: Unremarkable. Other: Atherosclerotic plaque of the carotid arteries within the neck. IMPRESSION: 1. No acute intracranial abnormality. 2. No acute displaced fracture or traumatic listhesis of the cervical spine. 3. Degenerative changes with at least moderate bilateral osseous neural foraminal stenosis at the C6-C7 level. Electronically Signed   By: Tish Frederickson M.D.   On: 03/05/2023 11:37   CT L-SPINE NO CHARGE  Result Date: 03/05/2023 CLINICAL DATA:  Patient complaining of  left sided chest pain since 0630 today. Patient is not currently taking blood thinners. Patient also had an unwitnessed fall on Saturday  EXAM: CT LUMBAR SPINE WITHOUT CONTRAST TECHNIQUE: Multidetector CT imaging of the lumbar spine was performed without intravenous contrast administration. Multiplanar CT image reconstructions were also generated. RADIATION DOSE REDUCTION: This exam was performed according to the departmental dose-optimization program which includes automated exposure control, adjustment of the mA and/or kV according to patient size and/or use of iterative reconstruction technique. COMPARISON:  Chest x-ray 03/05/2023, chest x-ray 10/11/2022, CT abdomen pelvis 02/20/2020 FINDINGS: Segmentation: 6 lumbar type vertebrae (named L1-L6). Alignment: Normal. Vertebrae: Diffusely decreased bone density. L2-L3 at least moderate degenerative changes. Multilevel mild degenerative changes. No associated severe osseous neural foraminal or central canal stenosis. No acute fracture or focal pathologic process. Paraspinal and other soft tissues: Negative. Disc levels: Intervertebral disc space vacuum phenomenon at the L2-L3 level. Other: Interval development of an age-indeterminate T11 vertebral body fracture with at least 50% vertebral body height loss. Please see separately dictated CT abdomen pelvis 03/05/2023. Severe atherosclerotic plaque. IMPRESSION: 1. No acute displaced fracture or traumatic listhesis of the lumbar spine. 2. Interval development of an age-indeterminate T11 vertebral body fracture with at least 50% vertebral body height loss. Correlate with point tenderness to palpation to evaluate for an acute component. 3. Aortic Atherosclerosis (ICD10-I70.0). Electronically Signed   By: Tish Frederickson M.D.   On: 03/05/2023 11:32   DG Chest 2 View  Result Date: 03/05/2023 CLINICAL DATA:  Chest pain. EXAM: CHEST - 2 VIEW COMPARISON:  Chest radiographs 10/11/2022 and 01/14/2020 FINDINGS: The cardiac silhouette is borderline enlarged. Aortic atherosclerosis is noted. Chronic interstitial coarsening is similar to the prior study. No acute  airspace consolidation, overt pulmonary edema, or pneumothorax is identified. There is mild blunting of the right lateral costophrenic angle without a pleural effusion present on today's abdominal CT. A T11 compression fracture is new from 2021 with mild-to-moderate anterior vertebral body height loss. IMPRESSION: 1. Chronic lung changes without evidence of acute cardiopulmonary disease. 2. T11 compression fracture which will be more fully evaluated on pending lumbar spine CT. Electronically Signed   By: Sebastian Ache M.D.   On: 03/05/2023 10:59    Microbiology: Results for orders placed or performed during the hospital encounter of 10/07/22  Urine Culture (for pregnant, neutropenic or urologic patients or patients with an indwelling urinary catheter)     Status: Abnormal   Collection Time: 10/07/22  1:47 PM   Specimen: Urine, Clean Catch  Result Value Ref Range Status   Specimen Description   Final    URINE, CLEAN CATCH Performed at [facility], [address], [place], [place] [address]    Special Requests   Final    NONE Performed at [facility], [address] [place], [place] [address]    Culture (A)  Final    <10,000 COLONIES/mL INSIGNIFICANT GROWTH Performed at [facility], [address], [place], [place] [address]    Report Status 10/08/2022 FINAL  Final  Resp panel by RT-PCR (RSV, Flu A&B, Covid) Anterior Nasal Swab     Status: None   Collection Time: 10/11/22  3:48 PM   Specimen: Anterior Nasal Swab  Result Value Ref Range Status   SARS Coronavirus 2 by RT PCR NEGATIVE NEGATIVE Final    Comment: (NOTE) SARS-CoV-2 target nucleic acids are NOT DETECTED.  The SARS-CoV-2 RNA is generally detectable in upper respiratory specimens during the acute phase of infection. The  lowest concentration of SARS-CoV-2 viral copies this assay can detect is 138 copies/mL. A negative result does not preclude SARS-Cov-2 infection and should not be used as the sole  basis for treatment or other patient management decisions. A negative result may occur with  improper specimen collection/handling, submission of specimen other than nasopharyngeal swab, presence of viral mutation(s) within the areas targeted by this assay, and inadequate number of viral copies(<138 copies/mL). A negative result must be combined with clinical observations, patient history, and epidemiological information. The expected result is Negative.  Fact Sheet for Patients:  BloggerCourse.com  Fact Sheet for Healthcare Providers:  SeriousBroker.it  This test is no t yet approved or cleared by the [place] FDA and  has been authorized for detection and/or diagnosis of SARS-CoV-2 by FDA under an Emergency Use Authorization (EUA). This EUA will remain  in effect (meaning this test can be used) for the duration of the COVID-19 declaration under Section 564(b)(1) of the Act, 21 U.S.C.section 360bbb-3(b)(1), unless the authorization is terminated  or revoked sooner.       Influenza A by PCR NEGATIVE NEGATIVE Final   Influenza B by PCR NEGATIVE NEGATIVE Final    Comment: (NOTE) The Xpert Xpress SARS-CoV-2/FLU/RSV plus assay is intended as an aid in the diagnosis of influenza from Nasopharyngeal swab specimens and should not be used as a sole basis for treatment. Nasal washings and aspirates are unacceptable for Xpert Xpress SARS-CoV-2/FLU/RSV testing.  Fact Sheet for Patients: BloggerCourse.com  Fact Sheet for Healthcare Providers: SeriousBroker.it  This test is not yet approved or cleared by the [place] FDA and has been authorized for detection and/or diagnosis of SARS-CoV-2 by FDA under an Emergency Use Authorization (EUA). This EUA will remain in effect (meaning this test can be used) for the duration of the COVID-19 declaration under Section 564(b)(1) of the Act,  21 U.S.C. section 360bbb-3(b)(1), unless the authorization is terminated or revoked.     Resp Syncytial Virus by PCR NEGATIVE NEGATIVE Final    Comment: (NOTE) Fact Sheet for Patients: BloggerCourse.com  Fact Sheet for Healthcare Providers: SeriousBroker.it  This test is not yet approved or cleared by the [place] FDA and has been authorized for detection and/or diagnosis of SARS-CoV-2 by FDA under an Emergency Use Authorization (EUA). This EUA will remain in effect (meaning this test can be used) for the duration of the COVID-19 declaration under Section 564(b)(1) of the Act, 21 U.S.C. section 360bbb-3(b)(1), unless the authorization is terminated or revoked.  Performed at [facility], [address] [place], [place] [address]     Labs: CBC: Recent Labs  Lab 03/05/23 0945  WBC 11.0*  HGB 11.5*  HCT 35.0*  MCV 92.8  PLT 178   Basic Metabolic Panel: Recent Labs  Lab 03/05/23 0945 03/06/23 0821  NA 136 135  K 5.5* 5.0  CL 108 108  CO2 20* 22  GLUCOSE 116* 98  BUN 39* 34*  CREATININE 1.33* 1.40*  CALCIUM 9.6 8.9   Liver Function Tests: Recent Labs  Lab 03/05/23 0945  AST 15  ALT 10  ALKPHOS 64  BILITOT 0.7  PROT 6.8  ALBUMIN 3.3*   CBG: No results for input(s): "GLUCAP" in the last 168 hours.  Discharge time spent: greater than 30 minutes.  Signed: Marrion Coy, MD [facility] 03/06/2023

## 2023-03-06 NOTE — Progress Notes (Signed)
   03/06/23 1150  Spiritual Encounters  Type of Visit Initial  Care provided to: Patient  Referral source Other (comment) (Spiritual Consult)  Reason for visit Routine spiritual support  OnCall Visit No   Chaplain responded to a spiritual consult for prayer. The patient, Audrey Duran was alert and happy to visit with me. Audrey Duran shared her story mentioning her recent stay for rehab. She was somewhat confused when I asked her what she would like to pray for but we worked through that.  She also expressed a desire to return home after she gets better. Audrey Duran thanked me for visiting with her and for prayer as I departed.   Valerie Roys [facility] [phone]

## 2023-03-06 NOTE — Evaluation (Signed)
Physical Therapy Evaluation Patient Details Name: Audrey Duran MRN: [id number] DOB: 02/07/1935 Today's Date: 03/06/2023  History of Present Illness  Pt is an 88 y/o F admitted on 03/05/23 after presenting with c/o worsening back pain & ambulation difficulties. CT thoracic spine showed compression fraction T11 30% vertebral body height loss no definitive involvement of the posterior elements. Image was reviewed by neurosurgery and recommended conservative management with TLSO. PMH: osteoporosis, COPD, CAD/MI, dementia, CKD 3A, PAF on coumadin, anemia, a-fib, HLD  Clinical Impression  Pt seen for PT evaluation with pt agreeable. PT adjusted TLSO for improved comfort/fit. Pt attempts STS without AD with mod<>max assist 2/2 posterior LOB, progressing to min assist without UE support. Provided pt with RW for safety & improved balance with mobility & pt able to ambulate 2 laps around nurses station with CGA. Pt presents with decreased safety awareness, and would benefit from further education re: use of TLSO & back precautions.         If plan is discharge home, recommend the following: A little help with walking and/or transfers;A little help with bathing/dressing/bathroom   Can travel by private vehicle        Equipment Recommendations Rolling walker (2 wheels)  Recommendations for Other Services       Functional Status Assessment Patient has had a recent decline in their functional status and demonstrates the ability to make significant improvements in function in a reasonable and predictable amount of time.     Precautions / Restrictions Precautions Precautions: Fall;Back Required Braces or Orthoses: Other Brace Other Brace: TLSO brace for comfort; may be removed to shower per orders Restrictions Weight Bearing Restrictions: No      Mobility  Bed Mobility               General bed mobility comments: not tested, pt received & left sitting in recliner     Transfers Overall transfer level: Needs assistance Equipment used: Rolling walker (2 wheels), None Transfers: Sit to/from Stand Sit to Stand: Mod assist, Contact guard assist           General transfer comment: Pt transfers STS without AD with mod<>max assist with pt demonstrating posterior lean & PT assists to correct. Pt is able to progress during session to STS without BUE support with mi nassist with PT facilitating anterior weight shift. Provided pt with RW & pt able to transfer STS with RW & CGA, education re: hand placement to push to standing.    Ambulation/Gait Ambulation/Gait assistance: Contact guard assist Gait Distance (Feet): 300 Feet Assistive device: Rolling walker (2 wheels) Gait Pattern/deviations: Decreased step length - right, Decreased stride length, Step-through pattern, Decreased step length - left Gait velocity: decreased        Stairs            Wheelchair Mobility     Tilt Bed    Modified Rankin (Stroke Patients Only)       Balance Overall balance assessment: Needs assistance Sitting-balance support: No upper extremity supported, Feet supported Sitting balance-Leahy Scale: Good     Standing balance support: During functional activity, Bilateral upper extremity supported, Reliant on assistive device for balance Standing balance-Leahy Scale: Fair                 High Level Balance Comments: Pt able to ambulate around unit with RW while recalling months of the year backwards with extra time & min cuing for sustained attention to activity.  Pertinent Vitals/Pain Pain Assessment Pain Assessment: No/denies pain    Home Living Family/patient expects to be discharged to:: Assisted living                 Home Equipment: Rolling Walker (2 wheels);Grab bars - tub/shower      Prior Function Prior Level of Function : Independent/Modified Independent             Mobility Comments: ambulated without  AD ADLs Comments: recently moved to ALF x2 weeks so at least supv-distant supv with ADLs.  Pt reports that she did not require any physical assist for her shower.     Extremity/Trunk Assessment   Upper Extremity Assessment Upper Extremity Assessment: Generalized weakness    Lower Extremity Assessment Lower Extremity Assessment: Generalized weakness    Cervical / Trunk Assessment Cervical / Trunk Assessment:  (TLSO donned during session)  Communication   Communication Communication: No apparent difficulties Cueing Techniques: Verbal cues  Cognition Arousal: Alert Behavior During Therapy: WFL for tasks assessed/performed Overall Cognitive Status: History of cognitive impairments - at baseline                                 General Comments: hx of dementia, follows simple commands throughout session, decreasd awareness of safety, AxO to person, place, situation, time (month but not year, reports it's 1924)        General Comments General comments (skin integrity, edema, etc.): PT adjusted TLSO for better/more snug, comfortable fit.    Exercises Other Exercises Other Exercises: Pt performed 5x STS from recliner without BUE support with CGA<>min assist with focus on BLE strengthening & endurance training, PT provides tactile cuing/manual facilitation for increased anterior weight shift for increased ease of transfer.   Assessment/Plan    PT Assessment Patient needs continued PT services  PT Problem List Decreased strength;Decreased activity tolerance;Decreased balance;Decreased mobility;Decreased knowledge of precautions;Decreased safety awareness;Decreased knowledge of use of DME       PT Treatment Interventions DME instruction;Balance training;Gait training;Neuromuscular re-education;Stair training;Patient/family education;Functional mobility training;Therapeutic exercise;Manual techniques;Therapeutic activities    PT Goals (Current goals can be found in the  Care Plan section)  Acute Rehab PT Goals Patient Stated Goal: go home PT Goal Formulation: With patient Time For Goal Achievement: 03/20/23 Potential to Achieve Goals: Good    Frequency Min 1X/week     Co-evaluation               AM-PAC PT "6 Clicks" Mobility  Outcome Measure Help needed turning from your back to your side while in a flat bed without using bedrails?: None Help needed moving from lying on your back to sitting on the side of a flat bed without using bedrails?: A Little Help needed moving to and from a bed to a chair (including a wheelchair)?: A Little Help needed standing up from a chair using your arms (e.g., wheelchair or bedside chair)?: A Little Help needed to walk in hospital room?: A Little Help needed climbing 3-5 steps with a railing? : A Little 6 Click Score: 19    End of Session Equipment Utilized During Treatment: Back brace Activity Tolerance: Patient tolerated treatment well Patient left: in chair;with chair alarm set;with call bell/phone within reach Nurse Communication: Mobility status PT Visit Diagnosis: Muscle weakness (generalized) (M62.81);Unsteadiness on feet (R26.81)    Time: 1610-9604 PT Time Calculation (min) (ACUTE ONLY): 13 min   Charges:   PT Evaluation $PT Eval Low  Complexity: 1 Low   PT General Charges $$ ACUTE PT VISIT: 1 Visit         Aleda Grana, PT, DPT 03/06/23, 1:23 PM   Sandi Mariscal 03/06/2023, 1:21 PM

## 2023-03-06 NOTE — Evaluation (Signed)
Occupational Therapy Evaluation Patient Details Name: Audrey Duran MRN: [id number] DOB: 1935/03/05 Today's Date: 03/06/2023   History of Present Illness Audrey Duran is a 88 y.o. female with medical history significant of osteoporosis, COPD, CAD/MI, dementia, CKD stage IIIa, PAF off Coumadin in 2013, presented with worsening of back pain and ambulation difficulties.     Patient moved to live in assisted living about 2 weeks ago.  She had an episode of fall about 5 days ago in the bathroom which she attributed to " the cement floor caught my foot" and she fell backward and hit her tailbone.  Denies any head or neck injury, no LOC.  And she was able to stand up and walk again but starting next day she started to feel severe back pain, nonradiating, constant and to a point she had to use her both hands to hold her back to walk to relieve the pain.  Denies any urine or bowel movement problems, denies any numbness weakness of any of the limbs.  Having been struggled for 4 days she decided to come to hospital today.  Denies any chest pain abdominal pain urinary problems or diarrhea no cough denies any skin rash or open wound.  ED Course: Afebrile, no tachycardia no hypotension CT thoracic spine showed compression fraction T11 30% vertebral body height loss no definitive involvement of the posterior elements.   Clinical Impression   Pt seen for OT evaluation this date.  OT instructed pt in tightening her TLSO brace with good return demo.  Completed educ on spinal precautions with additional reinforcement needed with ADLs as LB ADLs have not yet been addressed.  Pt tolerated ADLs and functional mobility well with brace and reported no pain during activity, stating the brace felt good.  Pt completed toilet transfer with grab bar and min guard, and then able to progress to close supv once 3in1 was placed over toilet.  Pt in agreement that she would benefit from 3in1 upon d/c.  OT communicated this to TOC.   Pt new to Alf within the last 2 weeks and desires to go home, though she reports her daughter is "in charge."  Pt will benefit from return to ALF with [facility] OT upon d/c.  Will continue to follow in the acute setting to maximize safety and indep with ADLs while reinforcing spinal precautions.        If plan is discharge home, recommend the following: A little help with walking and/or transfers;A little help with bathing/dressing/bathroom;Assistance with cooking/housework;Assist for transportation    Functional Status Assessment  Patient has had a recent decline in their functional status and demonstrates the ability to make significant improvements in function in a reasonable and predictable amount of time.  Equipment Recommendations  BSC/3in1    Recommendations for Other Services       Precautions / Restrictions Precautions Precautions: Fall Required Braces or Orthoses: Other Brace Other Brace: TLSO brace; may be removed to shower per orders Restrictions Weight Bearing Restrictions: No      Mobility Bed Mobility Overal bed mobility: Needs Assistance Bed Mobility: Supine to Sit     Supine to sit: Contact guard, HOB elevated, Used rails     General bed mobility comments: reviewed spinal precautions before transfer Patient Response: Cooperative  Transfers Overall transfer level: Needs assistance Equipment used: Rollator (4 wheels) Transfers: Sit to/from Stand, Bed to chair/wheelchair/BSC Sit to Stand: Contact guard assist     Step pivot transfers: Supervision     General transfer  comment: vc for hand placement      Balance Overall balance assessment: Needs assistance Sitting-balance support: No upper extremity supported, Feet supported Sitting balance-Leahy Scale: Good     Standing balance support: Bilateral upper extremity supported, During functional activity, Reliant on assistive device for balance Standing balance-Leahy Scale: Fair Standing balance comment: Able  to stand at sink with close supv for hand hygiene                           ADL either performed or assessed with clinical judgement   ADL Overall ADL's : Needs assistance/impaired     Grooming: Wash/dry hands;Supervision/safety;Standing                   Toilet Transfer: Contact guard assist;Rollator (4 wheels);Grab bars Toilet Transfer Details (indicate cue type and reason): able to perform with 3in1 over toilet with close supv and vc for hand placement; min guard without 3in1 Toileting- Clothing Manipulation and Hygiene: Set up;Sit to/from stand       Functional mobility during ADLs: Contact guard assist;Supervision/safety;Rolling walker (2 wheels)       Vision Patient Visual Report: No change from baseline                         Pertinent Vitals/Pain Pain Assessment Pain Assessment: No/denies pain (Pt denied pain during session and felt the TLSO helped her comfort)     Extremity/Trunk Assessment Upper Extremity Assessment Upper Extremity Assessment: Overall WFL for tasks assessed   Lower Extremity Assessment Lower Extremity Assessment: Generalized weakness   Cervical / Trunk Assessment Cervical / Trunk Assessment:  (TLSO required for ADLs/mobility; may be removed for shower)   Communication Communication Communication: No apparent difficulties Cueing Techniques: Verbal cues   Cognition Arousal: Alert Behavior During Therapy: WFL for tasks assessed/performed Overall Cognitive Status: History of cognitive impairments - at baseline                                 General Comments: hx of dementia     General Comments       Exercises Other Exercises Other Exercises: Educ on OT role, goals, poc   Shoulder Instructions      Home Living Family/patient expects to be discharged to:: Assisted living                             Home Equipment: Rolling Walker (2 wheels);Grab bars - tub/shower           Prior Functioning/Environment Prior Level of Function : Independent/Modified Independent             Mobility Comments: ambulated without AD pta ADLs Comments: recently moved to ALF x2 weeks so at least supv-distant supv with ADLs.  Pt reports that she did not require any physical assist for her shower.        OT Problem List: Decreased strength;Decreased knowledge of precautions;Decreased activity tolerance;Impaired balance (sitting and/or standing)      OT Treatment/Interventions: Self-care/ADL training;Patient/family education;Balance training;Therapeutic activities;Energy conservation;DME and/or AE instruction    OT Goals(Current goals can be found in the care plan section) Acute Rehab OT Goals Patient Stated Goal: To go home OT Goal Formulation: With patient Time For Goal Achievement: 03/20/23 Potential to Achieve Goals: Good ADL Goals Pt Will Perform Lower Body Bathing: with  min assist;sit to/from stand Pt Will Perform Lower Body Dressing: with min assist;sit to/from stand Pt Will Transfer to Toilet: with set-up;ambulating;bedside commode;grab bars  OT Frequency: Min 1X/week                  AM-PAC OT "6 Clicks" Daily Activity     Outcome Measure Help from another person eating meals?: None Help from another person taking care of personal grooming?: A Little Help from another person toileting, which includes using toliet, bedpan, or urinal?: A Little Help from another person bathing (including washing, rinsing, drying)?: A Little Help from another person to put on and taking off regular upper body clothing?: A Little Help from another person to put on and taking off regular lower body clothing?: A Lot 6 Click Score: 18   End of Session Equipment Utilized During Treatment: Gait belt;Rolling walker (2 wheels)  Activity Tolerance: Patient tolerated treatment well Patient left: in chair;with call bell/phone within reach;with chair alarm set  OT Visit Diagnosis:  Unsteadiness on feet (R26.81);Muscle weakness (generalized) (M62.81);History of falling (Z91.81)                Time: 8295-6213 OT Time Calculation (min): 35 min Charges:  OT General Charges $OT Visit: 1 Visit OT Evaluation $OT Eval Moderate Complexity: 1 Mod OT Treatments $Self Care/Home Management : 8-22 mins  Danelle Earthly, MS, OTR/L [facility] 03/06/2023, 1:08 PM

## 2023-03-06 NOTE — TOC Progression Note (Signed)
Transition of Care [facility]) - Progression Note    Patient Details  Name: Audrey Duran MRN: [id number] Date of Birth: September 19, 1934  Transition of Care [facility]) CM/SW Contact  Bing Quarry, RN Phone Number: 03/06/2023, 2:04 PM  Clinical Narrative: 8/10: [facility] is unable to accept patient for readmit till Monday due to being in Memory Care and admin/AC unavailable on weekends. HH order in for PT/OT/RN and [facility] notified of pending DC Monday 03/08/23. DME 3:1, attempted to find out if patient already has or has grab bars in bathrooms for this purpose, but have to check Monday with Dustin per person answering front phone. Updated provider, therapy, Unit RN. TOC to follow through disposition.   Gabriel Cirri MSN RN CM  Transitions of Care Department [facility] [phone] Weekends Only          Barriers to Discharge: Transportation (Facility will not accept till Monday 03/08/23.)  Expected Discharge Plan and Services         Expected Discharge Date: 03/06/23               DME Arranged:  (May need a 3:1 if facility does not provide or does not have grab bars in bathrooms.) DME Agency:  (Check with facility Monday.)       HH Arranged: RN, PT, OT [facility] Agency: [facility] Date [facility] Agency Contacted: 03/06/23 Time HH Agency Contacted: [phone] Representative spoke with at [facility] Agency: [facility]   Social Determinants of Health (SDOH) Interventions SDOH Screenings   Food Insecurity: No Food Insecurity (03/05/2023)  Housing: Low Risk  (03/05/2023)  Transportation Needs: No Transportation Needs (03/05/2023)  Utilities: Not At Risk (03/05/2023)  Depression (PHQ2-9): Low Risk  (10/02/2022)  Financial Resource Strain: Low Risk  (12/03/2020)  Social Connections: Unknown (12/09/2021)   Received from [facility]  Stress: No Stress Concern Present (12/03/2020)  Tobacco Use: Medium Risk (03/05/2023)    Readmission Risk Interventions     No data to display

## 2023-03-07 DIAGNOSIS — N1831 Chronic kidney disease, stage 3a: Secondary | ICD-10-CM | POA: Diagnosis not present

## 2023-03-07 DIAGNOSIS — R262 Difficulty in walking, not elsewhere classified: Secondary | ICD-10-CM | POA: Diagnosis not present

## 2023-03-07 DIAGNOSIS — S22080A Wedge compression fracture of T11-T12 vertebra, initial encounter for closed fracture: Secondary | ICD-10-CM | POA: Diagnosis not present

## 2023-03-07 NOTE — Hospital Course (Signed)
Audrey Duran is a 88 y.o. female with medical history significant of osteoporosis, COPD, CAD/MI, dementia, CKD stage IIIa, PAF off Coumadin in 2013, presented with worsening of back pain and ambulation difficulties.  She had an episode of fall about 5 days ago in the bathroom which she attributed to " the cement floor caught my foot" and she fell backward and hit her tailbone.  CT scan of the thoracic spine showed a compression fracture of T11 with a 30% vertebral body height loss.  Patient was seen by neurosurgery, recommended TLSO, no surgery.  Patient was given as needed pain medicine, she was also seen by PT/OT, she ambulated well.  But patient assisted living facility will not accept her until Monday.

## 2023-03-07 NOTE — Plan of Care (Signed)

## 2023-03-07 NOTE — Plan of Care (Signed)
  Problem: Education: Goal: Knowledge of General Education information will improve Description: Including pain rating scale, medication(s)/side effects and non-pharmacologic comfort measures Outcome: Progressing   Problem: Health Behavior/Discharge Planning: Goal: Ability to manage health-related needs will improve Outcome: Progressing   Problem: Clinical Measurements: Goal: Ability to maintain clinical measurements within normal limits will improve Outcome: Progressing Goal: Diagnostic test results will improve Outcome: Progressing   Problem: Activity: Goal: Risk for activity intolerance will decrease Outcome: Progressing   Problem: Nutrition: Goal: Adequate nutrition will be maintained Outcome: Progressing   Problem: Elimination: Goal: Will not experience complications related to bowel motility Outcome: Progressing Goal: Will not experience complications related to urinary retention Outcome: Progressing   Problem: Pain Managment: Goal: General experience of comfort will improve Outcome: Progressing   

## 2023-03-07 NOTE — Progress Notes (Signed)
  Progress Note   Patient: Audrey Duran WUJ:[id number] DOB: [birth year] DOA: 03/05/2023     0 DOS: the patient was seen and examined on 03/07/2023   Brief hospital course: Audrey Duran is a 88 y.o. female with medical history significant of osteoporosis, COPD, CAD/MI, dementia, CKD stage IIIa, PAF off Coumadin in 2013, presented with worsening of back pain and ambulation difficulties.  She had an episode of fall about 5 days ago in the bathroom which she attributed to " the cement floor caught my foot" and she fell backward and hit her tailbone.  CT scan of the thoracic spine showed a compression fracture of T11 with a 30% vertebral body height loss.  Patient was seen by neurosurgery, recommended TLSO, no surgery.  Patient was given as needed pain medicine, she was also seen by PT/OT, she ambulated well.  But patient assisted living facility will not accept her until Monday.   Principal Problem:   Impaired ambulation Active Problems:   Compression fracture of T11 vertebra (HCC)   Assessment and Plan: T11 vertebrae compression fracture. Debility. Continue symptomatic treatment, TLSO. Patient facility will not except her today, will discharge tomorrow.  Chronic kidney disease stage IIIa. Hyperkalemia. Renal function stable, potassium normalized.  Essential hypertension. Continue some home medicines.     Subjective:  Patient doing well today, currently pain controlled.  No shortness of breath.  Physical Exam: Vitals:   03/06/23 0723 03/06/23 1512 03/06/23 2343 03/07/23 0915  BP: (!) 120/43 (!) 117/41 (!) 117/48 (!) 142/52  Pulse: 68 65 64 65  Resp: 18 16 20 14   Temp: 98.2 F (36.8 C)  97.9 F (36.6 C) 97.6 F (36.4 C)  TempSrc: Oral     SpO2: 93% 96% 90% 92%  Weight:      Height:       General exam: Appears calm and comfortable  Respiratory system: Clear to auscultation. Respiratory effort normal. Cardiovascular system: S1 & S2 heard, RRR. No JVD, murmurs,  rubs, gallops or clicks. No pedal edema. Gastrointestinal system: Abdomen is nondistended, soft and nontender. No organomegaly or masses felt. Normal bowel sounds heard. Central nervous system: Alert and oriented x2. No focal neurological deficits. Extremities: Symmetric 5 x 5 power. Skin: No rashes, lesions or ulcers Psychiatry: Judgement and insight appear normal. Mood & affect appropriate.    Data Reviewed:  There are no new results to review at this time.  Family Communication: Updated daughter yesterday.  Disposition: Status is: Observation      Time spent: 35 minutes  Author: Marrion Coy, MD 03/07/2023 10:01 AM  For on call review [email or web address].

## 2023-03-08 ENCOUNTER — Telehealth: Payer: Self-pay

## 2023-03-08 DIAGNOSIS — R262 Difficulty in walking, not elsewhere classified: Secondary | ICD-10-CM | POA: Diagnosis not present

## 2023-03-08 DIAGNOSIS — S22080A Wedge compression fracture of T11-T12 vertebra, initial encounter for closed fracture: Secondary | ICD-10-CM | POA: Diagnosis not present

## 2023-03-08 DIAGNOSIS — N1831 Chronic kidney disease, stage 3a: Secondary | ICD-10-CM | POA: Diagnosis not present

## 2023-03-08 NOTE — Telephone Encounter (Signed)
The ER called Dr Madaline Brilliant about this patient on 03/05/23.   Per Dr Madaline Brilliant:   "24F from nursing facility fall with mid back pain, neuro intact per ED with CT scan of the lumbar region on the runoff demonstrated what appears to be a T11 approximately 25% loss of height compression fracture no spinal canal encroachment good alignment.  This assumes a lumbralized S1 vertebrae (or 6 lumbar vertebrae variant)  AP: TLSO brace for comfort and mobilize as tolerated.   Needs outpatient followup"

## 2023-03-08 NOTE — Plan of Care (Signed)

## 2023-03-08 NOTE — TOC Progression Note (Addendum)
Transition of Care [facility]) - Progression Note    Patient Details  Name: Audrey Duran MRN: [id number] Date of Birth: 08-24-1934  Transition of Care [facility]) CM/SW Contact  Marlowe Sax, RN Phone Number: 03/08/2023, 10:31 AM  Clinical Narrative:     Attempted to call the [facility], received a VM, left a message for the director for a call back, I called again and requested the memory care coordinator, no answer the phone rang until it cut off    Barriers to Discharge: Transportation (Facility will not accept till Monday 03/08/23.)  Expected Discharge Plan and Services         Expected Discharge Date: 03/08/23               DME Arranged:  (May need a 3:1 if facility does not provide or does not have grab bars in bathrooms.) DME Agency:  (Check with facility Monday.)       HH Arranged: RN, PT, OT [facility] Agency: [facility] Date [facility] Agency Contacted: 03/06/23 Time HH Agency Contacted: [phone] Representative spoke with at [facility] Agency: [facility]   Social Determinants of Health (SDOH) Interventions SDOH Screenings   Food Insecurity: No Food Insecurity (03/05/2023)  Housing: Low Risk  (03/05/2023)  Transportation Needs: No Transportation Needs (03/05/2023)  Utilities: Not At Risk (03/05/2023)  Depression (PHQ2-9): Low Risk  (10/02/2022)  Financial Resource Strain: Low Risk  (12/03/2020)  Social Connections: Unknown (12/09/2021)   Received from [facility]  Stress: No Stress Concern Present (12/03/2020)  Tobacco Use: Medium Risk (03/05/2023)    Readmission Risk Interventions     No data to display

## 2023-03-08 NOTE — TOC Transition Note (Addendum)
Transition of Care [facility]) - CM/SW Discharge Note   Patient Details  Name: Audrey Duran MRN: [id number] Date of Birth: Jun 27, 1935  Transition of Care [facility]) CM/SW Contact:  Marlowe Sax, RN Phone Number: 03/08/2023, 11:31 AM   Clinical Narrative:    Spoke with daughter Luster Landsberg and she is aware of th edischarge and asked that I call Velna Hatchet the patient's sister to transdport, I called Shela and she is going to come and pick up the patient to transport to [facility], I was able to reach the [facility] and they are aweare of there DC and I submitted the DS Summary [facility], she is open with [facility], They do have grab bars in the bathroom   Final next level of care: Assisted Living Barriers to Discharge: Transportation (Facility will not accept till Monday 03/08/23.)   Patient Goals and CMS Choice      Discharge Placement                  Patient to be transferred to facility by: EMS or [facility] transport during the week.      Discharge Plan and Services Additional resources added to the After Visit Summary for                  DME Arranged:  (May need a 3:1 if facility does not provide or does not have grab bars in bathrooms.) DME Agency:  (Check with facility Monday.)       HH Arranged: RN, PT, OT [facility] Agency: [facility] Date [facility] Agency Contacted: 03/06/23 Time HH Agency Contacted: [phone] Representative spoke with at [facility] Agency: [facility]  Social Determinants of Health (SDOH) Interventions SDOH Screenings   Food Insecurity: No Food Insecurity (03/05/2023)  Housing: Low Risk  (03/05/2023)  Transportation Needs: No Transportation Needs (03/05/2023)  Utilities: Not At Risk (03/05/2023)  Depression (PHQ2-9): Low Risk  (10/02/2022)  Financial Resource Strain: Low Risk  (12/03/2020)  Social Connections: Unknown (12/09/2021)   Received from [facility]  Stress: No Stress Concern Present (12/03/2020)  Tobacco Use: Medium Risk (03/05/2023)     Readmission Risk  Interventions     No data to display

## 2023-03-08 NOTE — TOC Progression Note (Signed)
Transition of Care [facility]) - Progression Note    Patient Details  Name: OLETHA EMSHOFF MRN: [id number] Date of Birth: [birth year]  Transition of Care [facility]) CM/SW Contact  Marlowe Sax, RN Phone Number: 03/08/2023, 9:00 AM  Clinical Narrative:    Called and left a secure VM for Dustin asking about grab bars and DME, I let him know plan to return to the [facility] today, provided my contact number for a call back     Barriers to Discharge: Transportation (Facility will not accept till Monday 03/08/23.)  Expected Discharge Plan and Services         Expected Discharge Date: 03/06/23               DME Arranged:  (May need a 3:1 if facility does not provide or does not have grab bars in bathrooms.) DME Agency:  (Check with facility Monday.)       HH Arranged: RN, PT, OT [facility] Agency: [facility] Date [facility] Agency Contacted: 03/06/23 Time HH Agency Contacted: 1404 Representative spoke with at [facility] Agency: [facility]   Social Determinants of Health (SDOH) Interventions SDOH Screenings   Food Insecurity: No Food Insecurity (03/05/2023)  Housing: Low Risk  (03/05/2023)  Transportation Needs: No Transportation Needs (03/05/2023)  Utilities: Not At Risk (03/05/2023)  Depression (PHQ2-9): Low Risk  (10/02/2022)  Financial Resource Strain: Low Risk  (12/03/2020)  Social Connections: Unknown (12/09/2021)   Received from [facility]  Stress: No Stress Concern Present (12/03/2020)  Tobacco Use: Medium Risk (03/05/2023)    Readmission Risk Interventions     No data to display

## 2023-03-08 NOTE — Telephone Encounter (Signed)
Please offer her a new patient appointment with Duwayne Heck or Stacy in 2-3 weeks with xray. Thanks

## 2023-03-08 NOTE — Discharge Summary (Signed)
Physician Discharge Summary   Patient: Audrey Duran MRN: [id number] DOB: [birth year]  Admit date:     03/05/2023  Discharge date: 03/08/23  Discharge Physician: Marrion Coy   PCP: Sherlene Shams, MD   Recommendations at discharge:   Follow-up with PCP in 1 week. Follow-up with neurosurgery in 1 month.  Discharge Diagnoses: Principal Problem:   Impaired ambulation Active Problems:   Paroxysmal atrial fibrillation (HCC)   COPD (chronic obstructive pulmonary disease) (HCC)   Hyperkalemia   CKD stage 3a, GFR 45-59 ml/min (HCC)   Compression fracture of T11 vertebra (HCC)  Resolved Problems:   * No resolved hospital problems. *  Hospital Course: Audrey Duran is a 88 y.o. female with medical history significant of osteoporosis, COPD, CAD/MI, dementia, CKD stage IIIa, PAF off Coumadin in 2013, presented with worsening of back pain and ambulation difficulties.  She had an episode of fall about 5 days ago in the bathroom which she attributed to " the cement floor caught my foot" and she fell backward and hit her tailbone.  CT scan of the thoracic spine showed a compression fracture of T11 with a 30% vertebral body height loss.  Patient was seen by neurosurgery, recommended TLSO, no surgery.  Patient was given as needed pain medicine, she was also seen by PT/OT, she ambulated well.  But patient assisted living facility will not accept her until Monday.  Assessment and Plan: T11 vertebrae compression fracture. Debility. Continue symptomatic treatment, TLSO. Will discharge to assisted-living facility today.   Chronic kidney disease stage IIIa. Hyperkalemia. Renal function stable, potassium normalized.   Essential hypertension. Continue some home medicines.       Consultants: Neurosurgery. Procedures performed: None  Disposition: Assisted living Diet recommendation:  Discharge Diet Orders (From admission, onward)     Start     Ordered   03/06/23 0000  Diet - low  sodium heart healthy        03/06/23 1335           Cardiac diet DISCHARGE MEDICATION: Allergies as of 03/08/2023       Reactions   Azithromycin    GI upset   Clarithromycin    ? Rash   Diclofenac    ? Rash   Lisinopril Other (See Comments)   hyperkalemia   Meloxicam    ? Rash   Nitrofurantoin Monohyd Macro    ? rash   Zocor [simvastatin]    Penicillins Rash        Medication List     TAKE these medications    acyclovir 400 MG tablet Commonly known as: ZOVIRAX Take 400 mg by mouth daily.   alendronate 70 MG tablet Commonly known as: FOSAMAX Take 1 tablet by mouth every 7 days on an empty stomach with a full glass of water   ALPRAZolam 0.5 MG tablet Commonly known as: XANAX Take 0.5 mg by mouth daily.   aspirin 81 MG tablet Take 81 mg by mouth at bedtime.   citalopram 20 MG tablet Commonly known as: CELEXA Take 1 tablet (20 mg total) by mouth daily.   cyanocobalamin 1000 MCG tablet Take one by mouth daily   HYDROcodone-acetaminophen 5-325 MG tablet Commonly known as: NORCO/VICODIN Take 1 tablet by mouth every 6 (six) hours as needed for moderate pain.   lidocaine 5 % ointment Commonly known as: XYLOCAINE Apply 1 Application topically as needed.   magnesium oxide 400 MG tablet Commonly known as: MAG-OX Take 400 mg by mouth daily.  mirtazapine 7.5 MG tablet Commonly known as: REMERON Take 1 tablet (7.5 mg total) by mouth at bedtime.   risperiDONE 0.25 MG tablet Commonly known as: RISPERDAL Take 1 tablet (0.25 mg total) by mouth at bedtime.   rosuvastatin 20 MG tablet Commonly known as: Crestor Take 1 tablet (20 mg total) by mouth daily.   telmisartan 20 MG tablet Commonly known as: MICARDIS Take 1 tablet (20 mg total) by mouth daily.   timolol 0.5 % ophthalmic solution Commonly known as: TIMOPTIC Place 1 drop into both eyes daily.   Vitamin D3 75 MCG (3000 UT) Tabs Take 1 tablet by mouth daily. Take one by mouth daily                Durable Medical Equipment  (From admission, onward)           Start     Ordered   03/06/23 1330  For home use only DME Bedside commode  Once       Comments: 3 in 1  Question:  Patient needs a bedside commode to treat with the following condition  Answer:  Closed wedge compression fracture of T11 vertebra (HCC)   03/06/23 1329            Contact information for follow-up providers     Sherlene Shams, MD Follow up in 1 week(s).   Specialty: Internal Medicine Contact information: [address] [place] [address] [phone]         Melina Modena, MD Follow up in 1 month(s).   Specialty: Neurosurgery Contact information: [address] [place] [address] [phone]              Contact information for after-discharge care     Destination     [facility] .   Service: Assisted Living Contact information: [address] [place] [address] [phone]                    Discharge Exam: Audrey Duran Weights   03/05/23 0933  Weight: 54.4 kg   General exam: Appears calm and comfortable  Respiratory system: Clear to auscultation. Respiratory effort normal. Cardiovascular system: S1 & S2 heard, RRR. No JVD, murmurs, rubs, gallops or clicks. No pedal edema. Gastrointestinal system: Abdomen is nondistended, soft and nontender. No organomegaly or masses felt. Normal bowel sounds heard. Central nervous system: Alert and oriented x2. No focal neurological deficits. Extremities: Symmetric 5 x 5 power. Skin: No rashes, lesions or ulcers Psychiatry: Judgement and insight appear normal. Mood & affect appropriate.     Condition at discharge: fair  The results of significant diagnostics from this hospitalization (including imaging, microbiology, ancillary and laboratory) are listed below for reference.   Imaging Studies: CT Thoracic Spine Wo Contrast  Result Date: 03/05/2023 CLINICAL  DATA:  Back trauma. EXAM: CT THORACIC SPINE WITHOUT CONTRAST TECHNIQUE: Multidetector CT images of the thoracic were obtained using the standard protocol without intravenous contrast. RADIATION DOSE REDUCTION: This exam was performed according to the departmental dose-optimization program which includes automated exposure control, adjustment of the mA and/or kV according to patient size and/or use of iterative reconstruction technique. COMPARISON:  CT cervical spine and lumbar spine earlier the same date. Thoracic spine radiographs 08/10/2019. Abdominal CT 02/20/2020 FINDINGS: Alignment: Normal Vertebrae: As noted on earlier abdominal CT, there is a biconcave compression fracture at T11 resulting in approximately 30% loss of vertebral body height. This is new from 2021 and likely  acute. There is mild osseous retropulsion without definite involvement of the posterior elements. No other acute fractures are identified. Paraspinal and other soft tissues: Mild paraspinous edema at T11 supporting the probable acute nature of this fracture. There is also some subcutaneous edema in the lower back near the thoracolumbar junction. No focal paraspinal or epidural hematoma. Atherosclerosis of the aorta, great vessels and coronary arteries. Mild emphysematous changes are noted in the lungs. There is mild biapical scarring and mild dependent atelectasis bilaterally. Disc levels: The thoracic disc heights are preserved. There is mild multilevel paraspinal osteophyte formation without evidence of large disc herniation, significant spinal stenosis or significant foraminal narrowing IMPRESSION: 1. Acute biconcave compression fracture at T11 with approximately 30% loss of vertebral body height and mild osseous retropulsion. No definite involvement of the posterior elements. 2. No other acute osseous findings. 3. No evidence of large disc herniation, significant spinal stenosis or significant foraminal narrowing. 4.  Aortic  Atherosclerosis (ICD10-I70.0). Electronically Signed   By: Carey Bullocks M.D.   On: 03/05/2023 15:14   CT ABDOMEN PELVIS W CONTRAST  Result Date: 03/05/2023 CLINICAL DATA:  Abdominal trauma, blunt . Patient complaining of left sided chest pain since 0630 today. Patient is not currently taking blood thinners. Patient also had an unwitnessed fall on Saturday. EXAM: CT ABDOMEN AND PELVIS WITH CONTRAST TECHNIQUE: Multidetector CT imaging of the abdomen and pelvis was performed using the standard protocol following bolus administration of intravenous contrast. RADIATION DOSE REDUCTION: This exam was performed according to the departmental dose-optimization program which includes automated exposure control, adjustment of the mA and/or kV according to patient size and/or use of iterative reconstruction technique. CONTRAST:  75mL OMNIPAQUE IOHEXOL 300 MG/ML  SOLN COMPARISON:  CT abdomen pelvis 02/20/2020 FINDINGS: Lower chest: Artery calcification. Bilateral lower lobe atelectasis. Hepatobiliary: Not enlarged. No focal lesion. No laceration or subcapsular hematoma. The gallbladder is otherwise unremarkable with no radio-opaque gallstones. No biliary ductal dilatation. Pancreas: Normal pancreatic contour. No main pancreatic duct dilatation. Spleen: Not enlarged. No focal lesion. No laceration, subcapsular hematoma, or vascular injury. Adrenals/Urinary Tract: No nodularity bilaterally. Bilateral kidneys enhance symmetrically. No hydronephrosis. No contusion, laceration, or subcapsular hematoma. Fluid density lesion of the right kidney likely represents a simple renal cyst. Simple renal cysts, in the absence of clinically indicated signs/symptoms, require no independent follow-up. No injury to the vascular structures or collecting systems. No hydroureter. The urinary bladder is unremarkable. On delayed imaging, there is no urothelial wall thickening and there are no filling defects in the opacified portions of the  bilateral collecting systems or ureters. Stomach/Bowel: No small or large bowel wall thickening or dilatation. Stool throughout the ascending and proximal transverse colon. Colonic diverticulosis. Status post appendectomy. Vasculature/Lymphatics: Severe atherosclerotic plaque. No abdominal aorta or iliac aneurysm. No active contrast extravasation or pseudoaneurysm. No abdominal, pelvic, inguinal lymphadenopathy. Reproductive: Normal. Other: No simple free fluid ascites. No pneumoperitoneum. No hemoperitoneum. No mesenteric hematoma identified. No organized fluid collection. Musculoskeletal: Mid lower back subcutaneus soft tissue edema and hematoma formation (4:366, 7:55). Surgical hardware fixation of the right femur partially visualized. Diffuse decreased bone density. No acute pelvic fracture. No sacral fracture. Age-indeterminate T11 compression fracture. Please see separately dictated CT lumbar spine 03/05/2023. Ports and Devices: None. IMPRESSION: 1. Mid lower back subcutaneus soft tissue edema and hematoma formation. 2. No acute intra-abdominal, intrapelvic traumatic injury. 3. Age-indeterminate T11 compression fracture with 50% vertebral body height loss. Correlate with point tenderness to palpation to evaluate for an acute component at the T11 level.  4. Please see separately dictated CT lumbar spine 03/05/2023. 5. Other imaging findings of potential clinical significance: Colonic diverticulosis with no acute diverticulitis. Aortic Atherosclerosis (ICD10-I70.0) -severe including coronary artery calcification. Electronically Signed   By: Tish Frederickson M.D.   On: 03/05/2023 11:52   CT HEAD WO CONTRAST ( )  Result Date: 03/05/2023 CLINICAL DATA:  Head trauma, moderate-severe; Neck trauma (Age >= 65y). Unwitnessed fall EXAM: CT HEAD WITHOUT CONTRAST CT CERVICAL SPINE WITHOUT CONTRAST TECHNIQUE: Multidetector CT imaging of the head and cervical spine was performed following the standard protocol without  intravenous contrast. Multiplanar CT image reconstructions of the cervical spine were also generated. RADIATION DOSE REDUCTION: This exam was performed according to the departmental dose-optimization program which includes automated exposure control, adjustment of the mA and/or kV according to patient size and/or use of iterative reconstruction technique. COMPARISON:  CT head 04/13/2015, CT head 01/13/2020 FINDINGS: CT HEAD FINDINGS Brain: Cerebral ventricle sizes are concordant with the degree of cerebral volume loss. Patchy and confluent areas of decreased attenuation are noted throughout the deep and periventricular white matter of the cerebral hemispheres bilaterally, compatible with chronic microvascular ischemic disease. No evidence of large-territorial acute infarction. No parenchymal hemorrhage. No mass lesion. No extra-axial collection. No mass effect or midline shift. No hydrocephalus. Basilar cisterns are patent. Vascular: No hyperdense vessel. Atherosclerotic calcifications are present within the cavernous internal carotid arteries. Skull: No acute fracture or focal lesion. Sinuses/Orbits: Paranasal sinuses and mastoid air cells are clear. The orbits are unremarkable. Other: None. CT CERVICAL SPINE FINDINGS Alignment: Grade 1 anterolisthesis of C5 on C6. Skull base and vertebrae: C6-C7 moderate degenerative changes of the spine. At least moderate bilateral osseous neural foraminal stenosis at the C6-C7 level. No severe osseous central canal stenosis. No acute fracture. No aggressive appearing focal osseous lesion or focal pathologic process. Soft tissues and spinal canal: No prevertebral fluid or swelling. No visible canal hematoma. Upper chest: Unremarkable. Other: Atherosclerotic plaque of the carotid arteries within the neck. IMPRESSION: 1. No acute intracranial abnormality. 2. No acute displaced fracture or traumatic listhesis of the cervical spine. 3. Degenerative changes with at least moderate  bilateral osseous neural foraminal stenosis at the C6-C7 level. Electronically Signed   By: Tish Frederickson M.D.   On: 03/05/2023 11:37   CT Cervical Spine Wo Contrast  Result Date: 03/05/2023 CLINICAL DATA:  Head trauma, moderate-severe; Neck trauma (Age >= 65y). Unwitnessed fall EXAM: CT HEAD WITHOUT CONTRAST CT CERVICAL SPINE WITHOUT CONTRAST TECHNIQUE: Multidetector CT imaging of the head and cervical spine was performed following the standard protocol without intravenous contrast. Multiplanar CT image reconstructions of the cervical spine were also generated. RADIATION DOSE REDUCTION: This exam was performed according to the departmental dose-optimization program which includes automated exposure control, adjustment of the mA and/or kV according to patient size and/or use of iterative reconstruction technique. COMPARISON:  CT head 04/13/2015, CT head 01/13/2020 FINDINGS: CT HEAD FINDINGS Brain: Cerebral ventricle sizes are concordant with the degree of cerebral volume loss. Patchy and confluent areas of decreased attenuation are noted throughout the deep and periventricular white matter of the cerebral hemispheres bilaterally, compatible with chronic microvascular ischemic disease. No evidence of large-territorial acute infarction. No parenchymal hemorrhage. No mass lesion. No extra-axial collection. No mass effect or midline shift. No hydrocephalus. Basilar cisterns are patent. Vascular: No hyperdense vessel. Atherosclerotic calcifications are present within the cavernous internal carotid arteries. Skull: No acute fracture or focal lesion. Sinuses/Orbits: Paranasal sinuses and mastoid air cells are clear. The orbits are unremarkable.  Other: None. CT CERVICAL SPINE FINDINGS Alignment: Grade 1 anterolisthesis of C5 on C6. Skull base and vertebrae: C6-C7 moderate degenerative changes of the spine. At least moderate bilateral osseous neural foraminal stenosis at the C6-C7 level. No severe osseous central canal  stenosis. No acute fracture. No aggressive appearing focal osseous lesion or focal pathologic process. Soft tissues and spinal canal: No prevertebral fluid or swelling. No visible canal hematoma. Upper chest: Unremarkable. Other: Atherosclerotic plaque of the carotid arteries within the neck. IMPRESSION: 1. No acute intracranial abnormality. 2. No acute displaced fracture or traumatic listhesis of the cervical spine. 3. Degenerative changes with at least moderate bilateral osseous neural foraminal stenosis at the C6-C7 level. Electronically Signed   By: Tish Frederickson M.D.   On: 03/05/2023 11:37   CT L-SPINE NO CHARGE  Result Date: 03/05/2023 CLINICAL DATA:  Patient complaining of left sided chest pain since 0630 today. Patient is not currently taking blood thinners. Patient also had an unwitnessed fall on Saturday EXAM: CT LUMBAR SPINE WITHOUT CONTRAST TECHNIQUE: Multidetector CT imaging of the lumbar spine was performed without intravenous contrast administration. Multiplanar CT image reconstructions were also generated. RADIATION DOSE REDUCTION: This exam was performed according to the departmental dose-optimization program which includes automated exposure control, adjustment of the mA and/or kV according to patient size and/or use of iterative reconstruction technique. COMPARISON:  Chest x-ray 03/05/2023, chest x-ray 10/11/2022, CT abdomen pelvis 02/20/2020 FINDINGS: Segmentation: 6 lumbar type vertebrae (named L1-L6). Alignment: Normal. Vertebrae: Diffusely decreased bone density. L2-L3 at least moderate degenerative changes. Multilevel mild degenerative changes. No associated severe osseous neural foraminal or central canal stenosis. No acute fracture or focal pathologic process. Paraspinal and other soft tissues: Negative. Disc levels: Intervertebral disc space vacuum phenomenon at the L2-L3 level. Other: Interval development of an age-indeterminate T11 vertebral body fracture with at least 50% vertebral  body height loss. Please see separately dictated CT abdomen pelvis 03/05/2023. Severe atherosclerotic plaque. IMPRESSION: 1. No acute displaced fracture or traumatic listhesis of the lumbar spine. 2. Interval development of an age-indeterminate T11 vertebral body fracture with at least 50% vertebral body height loss. Correlate with point tenderness to palpation to evaluate for an acute component. 3. Aortic Atherosclerosis (ICD10-I70.0). Electronically Signed   By: Tish Frederickson M.D.   On: 03/05/2023 11:32   DG Chest 2 View  Result Date: 03/05/2023 CLINICAL DATA:  Chest pain. EXAM: CHEST - 2 VIEW COMPARISON:  Chest radiographs 10/11/2022 and 01/14/2020 FINDINGS: The cardiac silhouette is borderline enlarged. Aortic atherosclerosis is noted. Chronic interstitial coarsening is similar to the prior study. No acute airspace consolidation, overt pulmonary edema, or pneumothorax is identified. There is mild blunting of the right lateral costophrenic angle without a pleural effusion present on today's abdominal CT. A T11 compression fracture is new from 2021 with mild-to-moderate anterior vertebral body height loss. IMPRESSION: 1. Chronic lung changes without evidence of acute cardiopulmonary disease. 2. T11 compression fracture which will be more fully evaluated on pending lumbar spine CT. Electronically Signed   By: Sebastian Ache M.D.   On: 03/05/2023 10:59    Microbiology: Results for orders placed or performed during the hospital encounter of 10/07/22  Urine Culture (for pregnant, neutropenic or urologic patients or patients with an indwelling urinary catheter)     Status: Abnormal   Collection Time: 10/07/22  1:47 PM   Specimen: Urine, Clean Catch  Result Value Ref Range Status   Specimen Description   Final    URINE, CLEAN CATCH Performed at [facility], [address], [place], [place] [address]    Special Requests   Final    NONE Performed at [facility], [address],  [place], [place] [address]    Culture (A)  Final    <10,000 COLONIES/mL INSIGNIFICANT GROWTH Performed at [facility], [address], [place], [place] [address]    Report Status 10/08/2022 FINAL  Final  Resp panel by RT-PCR (RSV, Flu A&B, Covid) Anterior Nasal Swab     Status: None   Collection Time: 10/11/22  3:48 PM   Specimen: Anterior Nasal Swab  Result Value Ref Range Status   SARS Coronavirus 2 by RT PCR NEGATIVE NEGATIVE Final    Comment: (NOTE) SARS-CoV-2 target nucleic acids are NOT DETECTED.  The SARS-CoV-2 RNA is generally detectable in upper respiratory specimens during the acute phase of infection. The lowest concentration of SARS-CoV-2 viral copies this assay can detect is 138 copies/mL. A negative result does not preclude SARS-Cov-2 infection and should not be used as the sole basis for treatment or other patient management decisions. A negative result may occur with  improper specimen collection/handling, submission of specimen other than nasopharyngeal swab, presence of viral mutation(s) within the areas targeted by this assay, and inadequate number of viral copies(<138 copies/mL). A negative result must be combined with clinical observations, patient history, and epidemiological information. The expected result is Negative.  Fact Sheet for Patients:  BloggerCourse.com  Fact Sheet for Healthcare Providers:  SeriousBroker.it  This test is no t yet approved or cleared by the [place] FDA and  has been authorized for detection and/or diagnosis of SARS-CoV-2 by FDA under an Emergency Use Authorization (EUA). This EUA will remain  in effect (meaning this test can be used) for the duration of the COVID-19 declaration under Section 564(b)(1) of the Act, 21 U.S.C.section 360bbb-3(b)(1), unless the authorization is terminated  or revoked sooner.       Influenza A by PCR NEGATIVE NEGATIVE Final   Influenza B  by PCR NEGATIVE NEGATIVE Final    Comment: (NOTE) The Xpert Xpress SARS-CoV-2/FLU/RSV plus assay is intended as an aid in the diagnosis of influenza from Nasopharyngeal swab specimens and should not be used as a sole basis for treatment. Nasal washings and aspirates are unacceptable for Xpert Xpress SARS-CoV-2/FLU/RSV testing.  Fact Sheet for Patients: BloggerCourse.com  Fact Sheet for Healthcare Providers: SeriousBroker.it  This test is not yet approved or cleared by the [place] FDA and has been authorized for detection and/or diagnosis of SARS-CoV-2 by FDA under an Emergency Use Authorization (EUA). This EUA will remain in effect (meaning this test can be used) for the duration of the COVID-19 declaration under Section 564(b)(1) of the Act, 21 U.S.C. section 360bbb-3(b)(1), unless the authorization is terminated or revoked.     Resp Syncytial Virus by PCR NEGATIVE NEGATIVE Final    Comment: (NOTE) Fact Sheet for Patients: BloggerCourse.com  Fact Sheet for Healthcare Providers: SeriousBroker.it  This test is not yet approved or cleared by the [place] FDA and has been authorized for detection and/or diagnosis of SARS-CoV-2 by FDA under an Emergency Use Authorization (EUA). This EUA will remain in effect (meaning this test can be used) for the duration of the COVID-19 declaration under Section 564(b)(1) of the Act, 21 U.S.C. section 360bbb-3(b)(1), unless the authorization is terminated or revoked.  Performed at [facility], [address] [place], [place] [address]     Labs: CBC: Recent Labs  Lab 03/05/23 0945  WBC 11.0*  HGB 11.5*  HCT 35.0*  MCV 92.8  PLT 178   Basic Metabolic Panel: Recent Labs  Lab 03/05/23 0945 03/06/23 0821  NA 136 135  K 5.5* 5.0  CL 108 108  CO2 20* 22  GLUCOSE 116* 98  BUN 39* 34*  CREATININE 1.33* 1.40*   CALCIUM 9.6 8.9   Liver Function Tests: Recent Labs  Lab 03/05/23 0945  AST 15  ALT 10  ALKPHOS 64  BILITOT 0.7  PROT 6.8  ALBUMIN 3.3*   CBG: No results for input(s): "GLUCAP" in the last 168 hours.  Discharge time spent: Less than 30 minutes.  Signed: Marrion Coy, MD [facility] 03/08/2023

## 2023-03-08 NOTE — [place] FL2 (Signed)
[place] MEDICAID FL2 LEVEL OF CARE FORM     IDENTIFICATION  Patient Name: Audrey Duran Birthdate: May 01, 1935 Sex: female Admission Date (Current Location): 03/05/2023  [facility] and [place] Number:  Chiropodist and Address:  [facility], [address], [place], [place] [address]      Provider Number: [id number]  Attending Physician Name and Address:  Marrion Coy, MD  Relative Name and Phone Number:  Luster Landsberg daughter [phone]    Current Level of Care: Hospital Recommended Level of Care: Memory Care Prior Approval Number:    Date Approved/Denied:   PASRR Number:    Discharge Plan: Other (Comment) (memory care)    Current Diagnoses: Patient Active Problem List   Diagnosis Date Noted   Compression fracture of T11 vertebra (HCC) 03/05/2023   Impaired ambulation 03/05/2023   Dementia with agitation (HCC) 10/13/2022   Altered mental status 10/07/2022   Protein-calorie malnutrition (HCC) 10/07/2022   Decreased appetite 10/07/2022   Essential hypertension 10/09/2020   Aortic atherosclerosis (HCC) 10/08/2020   Grief at loss of child 04/05/2020   S/P laparoscopic appendectomy 02/27/2020   History of herpes labialis 02/05/2020   Lower extremity edema 02/01/2020   Delusional disorder (HCC) 01/30/2020   Psychosis, 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   Unintentional weight loss 04/16/2019   Episodes of formed visual hallucinations 10/12/2018   Abnormal mammogram of right breast 04/08/2018   Impaired fasting glucose 08/29/2017   Prediabetes 05/21/2016   History of vertigo 11/16/2015   Hospital discharge follow-up 04/19/2015   History of Clostridium difficile colitis 12/14/2014   Long-term use of high-risk medication 05/08/2014   S/P TAH-BSO (total abdominal hysterectomy and bilateral salpingo-oophorectomy) 10/26/2013   Vitamin D deficiency 10/12/2013   CAD (coronary artery disease), native coronary artery 11/18/2012   CKD stage 3a, GFR 45-59  ml/min (HCC) 10/19/2012   Eosinophilia 09/21/2012   Hyperkalemia 09/21/2012   Anxiety state 09/20/2012   Osteopenia of the elderly 06/20/2012   At high risk for falls 06/20/2012   Encounter for preventive health examination 04/25/2012   Atrophic vaginitis 01/17/2012   Paroxysmal atrial fibrillation (HCC)    Recurrent HSV (herpes simplex virus)    COPD (chronic obstructive pulmonary disease) (HCC)    Ischemic cardiomyopathy    Cystocele or rectocele with complete uterine prolapse 08/02/2011   Screening for breast cancer 07/31/2011   Screening for cervical cancer 07/31/2011   Screening for colon cancer 07/31/2011   Coronary artery disease    History of myocardial infarction    Hyperlipidemia LDL goal <70     Orientation RESPIRATION BLADDER Height & Weight     Self, Place  Normal Continent, External catheter Weight: 54.4 kg Height:  5\' 3"  (160 cm)  BEHAVIORAL SYMPTOMS/MOOD NEUROLOGICAL BOWEL NUTRITION STATUS      Continent Diet (see dc summary)  AMBULATORY STATUS COMMUNICATION OF NEEDS Skin   Supervision Verbally Normal                       Personal Care Assistance Level of Assistance  Bathing, Feeding, Dressing Bathing Assistance: Limited assistance Feeding assistance: Independent Dressing Assistance: Limited assistance     Functional Limitations Info  Sight, Hearing, Speech Sight Info: Adequate Hearing Info: Adequate Speech Info: Adequate    SPECIAL CARE FACTORS FREQUENCY  PT (By licensed PT), OT (By licensed OT)     PT Frequency: 2 times per week OT Frequency: 2 times per week  Contractures Contractures Info: Not present    Additional Factors Info  Code Status, Allergies Code Status Info: Full code Allergies Info: Azithromycin, Clarithromycin, Diclofenac, Lisinopril, Meloxicam, Nitrofurantoin Monohyd Macro, Zocor (Simvastatin), Penicillins           Current Medications (03/08/2023):  This is the current hospital active medication  list Current Facility-Administered Medications  Medication Dose Route Frequency Provider Last Rate Last Admin   acetaminophen (TYLENOL) tablet 650 mg  650 mg Oral Q6H PRN Mikey College T, MD       Or   acetaminophen (TYLENOL) suppository 650 mg  650 mg Rectal Q6H PRN Emeline General, MD       aspirin EC tablet 81 mg  81 mg Oral QHS Mikey College T, MD   81 mg at 03/07/23 2135   bisacodyl (DULCOLAX) EC tablet 5 mg  5 mg Oral Daily PRN Mikey College T, MD   5 mg at 03/07/23 2135   citalopram (CELEXA) tablet 20 mg  20 mg Oral Daily Mikey College T, MD   20 mg at 03/07/23 0916   enoxaparin (LOVENOX) injection 30 mg  30 mg Subcutaneous Q24H Mikey College T, MD   30 mg at 03/07/23 2135   HYDROcodone-acetaminophen (NORCO/VICODIN) 5-325 MG per tablet 1-2 tablet  1-2 tablet Oral Q4H PRN Mikey College T, MD   1 tablet at 03/07/23 2134   irbesartan (AVAPRO) tablet 75 mg  75 mg Oral Daily Mikey College T, MD   75 mg at 03/07/23 0917   magnesium oxide (MAG-OX) tablet 400 mg  400 mg Oral Daily Mikey College T, MD   400 mg at 03/07/23 0916   mirtazapine (REMERON) tablet 7.5 mg  7.5 mg Oral QHS Mikey College T, MD   7.5 mg at 03/07/23 2135   ondansetron (ZOFRAN) tablet 4 mg  4 mg Oral Q6H PRN Mikey College T, MD       Or   ondansetron [facility]) injection 4 mg  4 mg Intravenous Q6H PRN Mikey College T, MD       risperiDONE (RISPERDAL) tablet 0.25 mg  0.25 mg Oral QHS Mikey College T, MD   0.25 mg at 03/07/23 2134   rosuvastatin (CRESTOR) tablet 20 mg  20 mg Oral QHS Mikey College T, MD   20 mg at 03/07/23 2134   senna-docusate (Senokot-S) tablet 1 tablet  1 tablet Oral QHS PRN Mikey College T, MD   1 tablet at 03/07/23 2135   timolol (TIMOPTIC) 0.5 % ophthalmic solution 1 drop  1 drop Both Eyes Daily Manuela Schwartz, NP   1 drop at 03/07/23 6440     Discharge Medications: Please see discharge summary for a list of discharge medications.  Relevant Imaging Results:  Relevant Lab Results:   Additional Information SSN  [id number]  Marlowe Sax, RN

## 2023-03-09 DIAGNOSIS — S22080D Wedge compression fracture of T11-T12 vertebra, subsequent encounter for fracture with routine healing: Secondary | ICD-10-CM | POA: Diagnosis not present

## 2023-03-09 DIAGNOSIS — I255 Ischemic cardiomyopathy: Secondary | ICD-10-CM | POA: Diagnosis not present

## 2023-03-09 DIAGNOSIS — J449 Chronic obstructive pulmonary disease, unspecified: Secondary | ICD-10-CM | POA: Diagnosis not present

## 2023-03-09 DIAGNOSIS — I1310 Hypertensive heart and chronic kidney disease without heart failure, with stage 1 through stage 4 chronic kidney disease, or unspecified chronic kidney disease: Secondary | ICD-10-CM | POA: Diagnosis not present

## 2023-03-09 DIAGNOSIS — I480 Paroxysmal atrial fibrillation: Secondary | ICD-10-CM | POA: Diagnosis not present

## 2023-03-09 DIAGNOSIS — I2510 Atherosclerotic heart disease of native coronary artery without angina pectoris: Secondary | ICD-10-CM | POA: Diagnosis not present

## 2023-03-09 DIAGNOSIS — M810 Age-related osteoporosis without current pathological fracture: Secondary | ICD-10-CM | POA: Diagnosis not present

## 2023-03-09 DIAGNOSIS — N1831 Chronic kidney disease, stage 3a: Secondary | ICD-10-CM | POA: Diagnosis not present

## 2023-03-09 DIAGNOSIS — I252 Old myocardial infarction: Secondary | ICD-10-CM | POA: Diagnosis not present

## 2023-03-09 DIAGNOSIS — F0390 Unspecified dementia without behavioral disturbance: Secondary | ICD-10-CM | POA: Diagnosis not present

## 2023-03-09 NOTE — Telephone Encounter (Signed)
Left message to call back  

## 2023-03-12 DIAGNOSIS — J449 Chronic obstructive pulmonary disease, unspecified: Secondary | ICD-10-CM | POA: Diagnosis not present

## 2023-03-12 DIAGNOSIS — M545 Low back pain, unspecified: Secondary | ICD-10-CM | POA: Diagnosis not present

## 2023-03-12 DIAGNOSIS — I480 Paroxysmal atrial fibrillation: Secondary | ICD-10-CM | POA: Diagnosis not present

## 2023-03-12 DIAGNOSIS — R4189 Other symptoms and signs involving cognitive functions and awareness: Secondary | ICD-10-CM | POA: Diagnosis not present

## 2023-03-12 DIAGNOSIS — I10 Essential (primary) hypertension: Secondary | ICD-10-CM | POA: Diagnosis not present

## 2023-03-15 DIAGNOSIS — F02B2 Dementia in other diseases classified elsewhere, moderate, with psychotic disturbance: Secondary | ICD-10-CM | POA: Diagnosis not present

## 2023-03-15 DIAGNOSIS — F419 Anxiety disorder, unspecified: Secondary | ICD-10-CM | POA: Diagnosis not present

## 2023-03-15 NOTE — Telephone Encounter (Signed)
She is at the [place] [facility] [phone], left message there to schedule appt.

## 2023-03-16 NOTE — Telephone Encounter (Signed)
[facility] confirmed appt for 04/01/2023

## 2023-03-21 DIAGNOSIS — I10 Essential (primary) hypertension: Secondary | ICD-10-CM | POA: Diagnosis not present

## 2023-03-22 ENCOUNTER — Telehealth: Payer: Self-pay | Admitting: Internal Medicine

## 2023-03-22 NOTE — Telephone Encounter (Signed)
Copied from CRM [id number]. Topic: Medicare AWV >> Mar 22, 2023  1:27 PM Payton Doughty wrote: Reason for CRM: LM 03/22/2023 to schedule AWV   Verlee Rossetti; Care Guide Ambulatory Clinical Support [facility] Direct Dial: [phone]

## 2023-03-25 ENCOUNTER — Other Ambulatory Visit: Payer: Self-pay | Admitting: Orthopedic Surgery

## 2023-03-25 DIAGNOSIS — N390 Urinary tract infection, site not specified: Secondary | ICD-10-CM | POA: Diagnosis not present

## 2023-03-25 DIAGNOSIS — S22080A Wedge compression fracture of T11-T12 vertebra, initial encounter for closed fracture: Secondary | ICD-10-CM

## 2023-03-25 NOTE — Progress Notes (Deleted)
Referring Physician:  Melina Modena, MD [address] [place],  [place] [address]  Primary Physician:  Sherlene Shams, MD  History of Present Illness: 03/25/2023*** [name] has a history of osteoporosis, COPD, CAD/MI, dementia, CKD 3a, and history of afib.   Seen in ED On 03/05/23 with mid back pain s/p fall. CT showed T11 compression fracture. ED reviewed with Dr. Madaline Brilliant- he recommended TLSO brace and outpatient follow up.     She is taking norco 5/325 for pain.      Duration: *** Location: *** Quality: *** Severity: ***  Precipitating: aggravated by *** Modifying factors: made better by *** Weakness: none Timing: *** Bowel/Bladder Dysfunction: none   Review of Systems:  A 10 point review of systems is negative, except for the pertinent positives and negatives detailed in the HPI.  Past Medical History: Past Medical History:  Diagnosis Date   Anemia    Atrial fibrillation (HCC)    COPD (chronic obstructive pulmonary disease) (HCC)    Coronary artery disease 1995   s/p AMI , no history of stents,  [name]   Hyperlipidemia    Ischemic cardiomyopathy Dec 2011   ETT Sestamibi study apical scar, no ischemia, [name]   Myocardial infarction (HCC)    Recurrent HSV (herpes simplex virus)     Past Surgical History: Past Surgical History:  Procedure Laterality Date   ABDOMINAL HYSTERECTOMY  june 2014   BREAST EXCISIONAL BIOPSY Left 1990's   neg   CAROTID ENDARTERECTOMY  July 2007   Dew, right carotid   CATARACT EXTRACTION  2004, 2006   JOINT REPLACEMENT  July 2013   total hip , Miller   LAPAROSCOPIC APPENDECTOMY N/A 02/20/2020   Procedure: APPENDECTOMY LAPAROSCOPIC;  Surgeon: Leafy Ro, MD;  Location: [facility] ORS;  Service: General;  Laterality: N/A;    Allergies: Allergies as of 04/01/2023 - Review Complete 03/05/2023  Allergen Reaction Noted   Azithromycin  07/31/2011   Clarithromycin  07/31/2011   Diclofenac  07/31/2011   Lisinopril Other  (See Comments) 10/28/2013   Meloxicam  07/31/2011   Nitrofurantoin monohyd macro  07/31/2011   Zocor [simvastatin]  07/31/2011   Penicillins Rash 04/13/2015    Medications: Outpatient Encounter Medications as of 04/01/2023  Medication Sig   acyclovir (ZOVIRAX) 400 MG tablet Take 400 mg by mouth daily.   alendronate (FOSAMAX) 70 MG tablet Take 1 tablet by mouth every 7 days on an empty stomach with a full glass of water   ALPRAZolam (XANAX) 0.5 MG tablet Take 0.5 mg by mouth daily.   aspirin 81 MG tablet Take 81 mg by mouth at bedtime.   Cholecalciferol (VITAMIN D3) 75 MCG (3000 UT) TABS Take 1 tablet by mouth daily. Take one by mouth daily   citalopram (CELEXA) 20 MG tablet Take 1 tablet (20 mg total) by mouth daily.   cyanocobalamin 1000 MCG tablet Take one by mouth daily   HYDROcodone-acetaminophen (NORCO/VICODIN) 5-325 MG tablet Take 1 tablet by mouth every 6 (six) hours as needed for moderate pain.   lidocaine (XYLOCAINE) 5 % ointment Apply 1 Application topically as needed.   magnesium oxide (MAG-OX) 400 MG tablet Take 400 mg by mouth daily.   mirtazapine (REMERON) 7.5 MG tablet Take 1 tablet (7.5 mg total) by mouth at bedtime.   risperiDONE (RISPERDAL) 0.25 MG tablet Take 1 tablet (0.25 mg total) by mouth at bedtime.   rosuvastatin (CRESTOR) 20 MG tablet Take 1 tablet (20 mg total) by mouth daily.  telmisartan (MICARDIS) 20 MG tablet Take 1 tablet (20 mg total) by mouth daily.   timolol (TIMOPTIC) 0.5 % ophthalmic solution Place 1 drop into both eyes daily.   No facility-administered encounter medications on file as of 04/01/2023.    Social History: Social History   Tobacco Use   Smoking status: Former    Current packs/day: 0.00    Types: Cigarettes    Quit date: 01/13/1992    Years since quitting: 31.2   Smokeless tobacco: Never  Substance Use Topics   Alcohol use: No    Alcohol/week: 0.0 standard drinks of alcohol   Drug use: No    Family Medical History: Family  History  Problem Relation Age of Onset   Cancer Mother 82       Pancreatic    Heart disease Father    Cancer Sister 17       Breast Cancer   Breast cancer Sister 54   Multiple sclerosis Daughter     Physical Examination: There were no vitals filed for this visit.  General: Patient is well developed, well nourished, calm, collected, and in no apparent distress. Attention to examination is appropriate.  Respiratory: Patient is breathing without any difficulty.   NEUROLOGICAL:     Awake, alert, oriented to person, place, and time.  Speech is clear and fluent. Fund of knowledge is appropriate.   Cranial Nerves: Pupils equal round and reactive to light.  Facial tone is symmetric.    *** ROM of cervical spine *** pain *** posterior cervical tenderness. *** tenderness in bilateral trapezial region.   *** ROM of lumbar spine *** pain *** posterior lumbar tenderness.   No abnormal lesions on exposed skin.   Strength: Side Biceps Triceps Deltoid Interossei Grip Wrist Ext. Wrist Flex.  R 5 5 5 5 5 5 5   L 5 5 5 5 5 5 5    Side Iliopsoas Quads Hamstring PF DF EHL  R 5 5 5 5 5 5   L 5 5 5 5 5 5    Reflexes are ***2+ and symmetric at the biceps, brachioradialis, patella and achilles.   Hoffman's is absent.  Clonus is not present.   Bilateral upper and lower extremity sensation is intact to light touch.     Gait is normal.   ***No difficulty with tandem gait.    Medical Decision Making  Imaging: Xrays of lumbar spine dated ***:  ***  Radiology report for above xrays not available yet.    CT of thoracic spine dated 03/05/23:  FINDINGS: Alignment: Normal   Vertebrae: As noted on earlier abdominal CT, there is a biconcave compression fracture at T11 resulting in approximately 30% loss of vertebral body height. This is new from 2021 and likely acute. There is mild osseous retropulsion without definite involvement of the posterior elements. No other acute fractures are  identified.   Paraspinal and other soft tissues: Mild paraspinous edema at T11 supporting the probable acute nature of this fracture. There is also some subcutaneous edema in the lower back near the thoracolumbar junction. No focal paraspinal or epidural hematoma. Atherosclerosis of the aorta, great vessels and coronary arteries. Mild emphysematous changes are noted in the lungs. There is mild biapical scarring and mild dependent atelectasis bilaterally.   Disc levels: The thoracic disc heights are preserved. There is mild multilevel paraspinal osteophyte formation without evidence of large disc herniation, significant spinal stenosis or significant foraminal narrowing   IMPRESSION: 1. Acute biconcave compression fracture at T11 with approximately 30% loss  of vertebral body height and mild osseous retropulsion. No definite involvement of the posterior elements. 2. No other acute osseous findings. 3. No evidence of large disc herniation, significant spinal stenosis or significant foraminal narrowing. 4.  Aortic Atherosclerosis (ICD10-I70.0).     Electronically Signed   By: Carey Bullocks M.D.   On: 03/05/2023 15:14  I have personally reviewed the images and agree with the above interpretation.  Assessment and Plan: Ms. Colcord is a pleasant 88 y.o. female has a T11 compression fracture s/p fall on 03/05/23.     Treatment options discussed with patient and following plan made:   - Order for physical therapy for *** spine ***. Patient to call to schedule appointment. *** - Continue current medications including ***. Reviewed dosing and side effects.  - Prescription for ***. Reviewed dosing and side effects. Take with food.  - Prescription for *** to take prn muscle spasms. Reviewed dosing and side effects. Discussed this can cause drowsiness.  - MRI of *** to further evaluate *** radiculopathy. No improvement time or medications (***).  - Referral to [facility] to discuss  possible *** injections.  - Will schedule phone visit to review MRI results once I get them back.   I spent a total of *** minutes in face-to-face and non-face-to-face activities related to this patient's care today including review of outside records, review of imaging, review of symptoms, physical exam, discussion of differential diagnosis, discussion of treatment options, and documentation.   Thank you for involving me in the care of this patient.   Drake Leach PA-C Dept. of Neurosurgery

## 2023-03-27 DIAGNOSIS — D518 Other vitamin B12 deficiency anemias: Secondary | ICD-10-CM | POA: Diagnosis not present

## 2023-03-27 DIAGNOSIS — E782 Mixed hyperlipidemia: Secondary | ICD-10-CM | POA: Diagnosis not present

## 2023-03-27 DIAGNOSIS — J449 Chronic obstructive pulmonary disease, unspecified: Secondary | ICD-10-CM | POA: Diagnosis not present

## 2023-03-27 DIAGNOSIS — E119 Type 2 diabetes mellitus without complications: Secondary | ICD-10-CM | POA: Diagnosis not present

## 2023-03-27 DIAGNOSIS — E559 Vitamin D deficiency, unspecified: Secondary | ICD-10-CM | POA: Diagnosis not present

## 2023-03-27 DIAGNOSIS — I10 Essential (primary) hypertension: Secondary | ICD-10-CM | POA: Diagnosis not present

## 2023-03-27 DIAGNOSIS — E038 Other specified hypothyroidism: Secondary | ICD-10-CM | POA: Diagnosis not present

## 2023-04-01 ENCOUNTER — Ambulatory Visit: Payer: PPO | Admitting: Orthopedic Surgery

## 2023-04-01 DIAGNOSIS — Z66 Do not resuscitate: Secondary | ICD-10-CM | POA: Diagnosis not present

## 2023-04-01 DIAGNOSIS — F0393 Unspecified dementia, unspecified severity, with mood disturbance: Secondary | ICD-10-CM | POA: Diagnosis not present

## 2023-04-01 DIAGNOSIS — F0392 Unspecified dementia, unspecified severity, with psychotic disturbance: Secondary | ICD-10-CM | POA: Diagnosis not present

## 2023-04-01 DIAGNOSIS — I5020 Unspecified systolic (congestive) heart failure: Secondary | ICD-10-CM | POA: Diagnosis not present

## 2023-04-01 DIAGNOSIS — F331 Major depressive disorder, recurrent, moderate: Secondary | ICD-10-CM | POA: Diagnosis not present

## 2023-04-01 DIAGNOSIS — I110 Hypertensive heart disease with heart failure: Secondary | ICD-10-CM | POA: Diagnosis not present

## 2023-04-01 DIAGNOSIS — F03911 Unspecified dementia, unspecified severity, with agitation: Secondary | ICD-10-CM | POA: Diagnosis not present

## 2023-04-01 DIAGNOSIS — Z515 Encounter for palliative care: Secondary | ICD-10-CM | POA: Diagnosis not present

## 2023-04-02 DIAGNOSIS — N390 Urinary tract infection, site not specified: Secondary | ICD-10-CM | POA: Diagnosis not present

## 2023-04-02 DIAGNOSIS — J449 Chronic obstructive pulmonary disease, unspecified: Secondary | ICD-10-CM | POA: Diagnosis not present

## 2023-04-02 DIAGNOSIS — E785 Hyperlipidemia, unspecified: Secondary | ICD-10-CM | POA: Diagnosis not present

## 2023-04-02 DIAGNOSIS — F22 Delusional disorders: Secondary | ICD-10-CM | POA: Diagnosis not present

## 2023-04-02 DIAGNOSIS — I10 Essential (primary) hypertension: Secondary | ICD-10-CM | POA: Diagnosis not present

## 2023-04-02 DIAGNOSIS — I480 Paroxysmal atrial fibrillation: Secondary | ICD-10-CM | POA: Diagnosis not present

## 2023-04-02 NOTE — Progress Notes (Deleted)
Referring Physician:  Melina Modena, MD [address] [place],  [place] [address]  Primary Physician:  Sherlene Shams, MD  History of Present Illness: 04/02/2023*** [name] has a history of osteoporosis, COPD, CAD/MI, dementia, CKD 3a, and history of afib.   Seen in ED On 03/05/23 with mid back pain s/p fall. CT showed T11 compression fracture. ED reviewed with Dr. Madaline Brilliant- he recommended TLSO brace and outpatient follow up.     She is taking norco 5/325 for pain.      Duration: *** Location: *** Quality: *** Severity: ***  Precipitating: aggravated by *** Modifying factors: made better by *** Weakness: none Timing: *** Bowel/Bladder Dysfunction: none   Review of Systems:  A 10 point review of systems is negative, except for the pertinent positives and negatives detailed in the HPI.  Past Medical History: Past Medical History:  Diagnosis Date   Anemia    Atrial fibrillation (HCC)    COPD (chronic obstructive pulmonary disease) (HCC)    Coronary artery disease 1995   s/p AMI , no history of stents,  [name]   Hyperlipidemia    Ischemic cardiomyopathy Dec 2011   ETT Sestamibi study apical scar, no ischemia, [name]   Myocardial infarction (HCC)    Recurrent HSV (herpes simplex virus)     Past Surgical History: Past Surgical History:  Procedure Laterality Date   ABDOMINAL HYSTERECTOMY  june 2014   BREAST EXCISIONAL BIOPSY Left 1990's   neg   CAROTID ENDARTERECTOMY  July 2007   Dew, right carotid   CATARACT EXTRACTION  2004, 2006   JOINT REPLACEMENT  July 2013   total hip , Miller   LAPAROSCOPIC APPENDECTOMY N/A 02/20/2020   Procedure: APPENDECTOMY LAPAROSCOPIC;  Surgeon: Leafy Ro, MD;  Location: [facility] ORS;  Service: General;  Laterality: N/A;    Allergies: Allergies as of 04/08/2023 - Review Complete 03/05/2023  Allergen Reaction Noted   Azithromycin  07/31/2011   Clarithromycin  07/31/2011   Diclofenac  07/31/2011   Lisinopril  Other (See Comments) 10/28/2013   Meloxicam  07/31/2011   Nitrofurantoin monohyd macro  07/31/2011   Zocor [simvastatin]  07/31/2011   Penicillins Rash 04/13/2015    Medications: Outpatient Encounter Medications as of 04/08/2023  Medication Sig   acyclovir (ZOVIRAX) 400 MG tablet Take 400 mg by mouth daily.   alendronate (FOSAMAX) 70 MG tablet Take 1 tablet by mouth every 7 days on an empty stomach with a full glass of water   ALPRAZolam (XANAX) 0.5 MG tablet Take 0.5 mg by mouth daily.   aspirin 81 MG tablet Take 81 mg by mouth at bedtime.   Cholecalciferol (VITAMIN D3) 75 MCG (3000 UT) TABS Take 1 tablet by mouth daily. Take one by mouth daily   citalopram (CELEXA) 20 MG tablet Take 1 tablet (20 mg total) by mouth daily.   cyanocobalamin 1000 MCG tablet Take one by mouth daily   HYDROcodone-acetaminophen (NORCO/VICODIN) 5-325 MG tablet Take 1 tablet by mouth every 6 (six) hours as needed for moderate pain.   lidocaine (XYLOCAINE) 5 % ointment Apply 1 Application topically as needed.   magnesium oxide (MAG-OX) 400 MG tablet Take 400 mg by mouth daily.   mirtazapine (REMERON) 7.5 MG tablet Take 1 tablet (7.5 mg total) by mouth at bedtime.   risperiDONE (RISPERDAL) 0.25 MG tablet Take 1 tablet (0.25 mg total) by mouth at bedtime.   rosuvastatin (CRESTOR) 20 MG tablet Take 1 tablet (20 mg total) by mouth daily.  telmisartan (MICARDIS) 20 MG tablet Take 1 tablet (20 mg total) by mouth daily.   timolol (TIMOPTIC) 0.5 % ophthalmic solution Place 1 drop into both eyes daily.   No facility-administered encounter medications on file as of 04/08/2023.    Social History: Social History   Tobacco Use   Smoking status: Former    Current packs/day: 0.00    Types: Cigarettes    Quit date: 01/13/1992    Years since quitting: 31.2   Smokeless tobacco: Never  Substance Use Topics   Alcohol use: No    Alcohol/week: 0.0 standard drinks of alcohol   Drug use: No    Family Medical  History: Family History  Problem Relation Age of Onset   Cancer Mother 79       Pancreatic    Heart disease Father    Cancer Sister 37       Breast Cancer   Breast cancer Sister 64   Multiple sclerosis Daughter     Physical Examination: There were no vitals filed for this visit.  General: Patient is well developed, well nourished, calm, collected, and in no apparent distress. Attention to examination is appropriate.  Respiratory: Patient is breathing without any difficulty.   NEUROLOGICAL:     Awake, alert, oriented to person, place, and time.  Speech is clear and fluent. Fund of knowledge is appropriate.   Cranial Nerves: Pupils equal round and reactive to light.  Facial tone is symmetric.    *** ROM of cervical spine *** pain *** posterior cervical tenderness. *** tenderness in bilateral trapezial region.   *** ROM of lumbar spine *** pain *** posterior lumbar tenderness.   No abnormal lesions on exposed skin.   Strength: Side Biceps Triceps Deltoid Interossei Grip Wrist Ext. Wrist Flex.  R 5 5 5 5 5 5 5   L 5 5 5 5 5 5 5    Side Iliopsoas Quads Hamstring PF DF EHL  R 5 5 5 5 5 5   L 5 5 5 5 5 5    Reflexes are ***2+ and symmetric at the biceps, brachioradialis, patella and achilles.   Hoffman's is absent.  Clonus is not present.   Bilateral upper and lower extremity sensation is intact to light touch.     Gait is normal.   ***No difficulty with tandem gait.    Medical Decision Making  Imaging: Xrays of lumbar spine dated ***:  ***  Radiology report for above xrays not available yet.    CT of thoracic spine dated 03/05/23:  FINDINGS: Alignment: Normal   Vertebrae: As noted on earlier abdominal CT, there is a biconcave compression fracture at T11 resulting in approximately 30% loss of vertebral body height. This is new from 2021 and likely acute. There is mild osseous retropulsion without definite involvement of the posterior elements. No other acute  fractures are identified.   Paraspinal and other soft tissues: Mild paraspinous edema at T11 supporting the probable acute nature of this fracture. There is also some subcutaneous edema in the lower back near the thoracolumbar junction. No focal paraspinal or epidural hematoma. Atherosclerosis of the aorta, great vessels and coronary arteries. Mild emphysematous changes are noted in the lungs. There is mild biapical scarring and mild dependent atelectasis bilaterally.   Disc levels: The thoracic disc heights are preserved. There is mild multilevel paraspinal osteophyte formation without evidence of large disc herniation, significant spinal stenosis or significant foraminal narrowing   IMPRESSION: 1. Acute biconcave compression fracture at T11 with approximately 30% loss  of vertebral body height and mild osseous retropulsion. No definite involvement of the posterior elements. 2. No other acute osseous findings. 3. No evidence of large disc herniation, significant spinal stenosis or significant foraminal narrowing. 4.  Aortic Atherosclerosis (ICD10-I70.0).     Electronically Signed   By: Carey Bullocks M.D.   On: 03/05/2023 15:14  I have personally reviewed the images and agree with the above interpretation.  Assessment and Plan: Ms. Raybould is a pleasant 88 y.o. female has a T11 compression fracture s/p fall on 03/05/23.     Treatment options discussed with patient and following plan made:   - Order for physical therapy for *** spine ***. Patient to call to schedule appointment. *** - Continue current medications including ***. Reviewed dosing and side effects.  - Prescription for ***. Reviewed dosing and side effects. Take with food.  - Prescription for *** to take prn muscle spasms. Reviewed dosing and side effects. Discussed this can cause drowsiness.  - MRI of *** to further evaluate *** radiculopathy. No improvement time or medications (***).  - Referral to [facility] to  discuss possible *** injections.  - Will schedule phone visit to review MRI results once I get them back.   I spent a total of *** minutes in face-to-face and non-face-to-face activities related to this patient's care today including review of outside records, review of imaging, review of symptoms, physical exam, discussion of differential diagnosis, discussion of treatment options, and documentation.   Thank you for involving me in the care of this patient.   Drake Leach PA-C Dept. of Neurosurgery

## 2023-04-05 ENCOUNTER — Ambulatory Visit
Admission: RE | Admit: 2023-04-05 | Discharge: 2023-04-05 | Disposition: A | Discharge status: Home / Self Care | Payer: PPO | Source: Ambulatory Visit | Attending: Orthopedic Surgery | Admitting: Orthopedic Surgery

## 2023-04-05 ENCOUNTER — Encounter: Payer: Self-pay | Admitting: Orthopedic Surgery

## 2023-04-05 ENCOUNTER — Ambulatory Visit (INDEPENDENT_AMBULATORY_CARE_PROVIDER_SITE_OTHER): Payer: PPO | Admitting: Orthopedic Surgery

## 2023-04-05 ENCOUNTER — Ambulatory Visit
Admission: RE | Admit: 2023-04-05 | Discharge: 2023-04-05 | Disposition: A | Discharge status: Home / Self Care | Payer: PPO | Attending: Orthopedic Surgery | Admitting: Orthopedic Surgery

## 2023-04-05 ENCOUNTER — Telehealth: Payer: Self-pay | Admitting: Orthopedic Surgery

## 2023-04-05 VITALS — BP 118/62 | Ht 63.0 in | Wt 120.4 lb

## 2023-04-05 DIAGNOSIS — W19XXXA Unspecified fall, initial encounter: Secondary | ICD-10-CM | POA: Diagnosis not present

## 2023-04-05 DIAGNOSIS — S22080A Wedge compression fracture of T11-T12 vertebra, initial encounter for closed fracture: Secondary | ICD-10-CM | POA: Insufficient documentation

## 2023-04-05 DIAGNOSIS — M545 Low back pain, unspecified: Secondary | ICD-10-CM | POA: Diagnosis not present

## 2023-04-05 DIAGNOSIS — M4854XA Collapsed vertebra, not elsewhere classified, thoracic region, initial encounter for fracture: Secondary | ICD-10-CM | POA: Diagnosis not present

## 2023-04-05 NOTE — Progress Notes (Signed)
Referring Physician:  Melina Modena, MD [address] [place],  [place] [address]  Primary Physician:  Sherlene Shams, MD  History of Present Illness: 04/05/2023 [name] has a history of osteoporosis, COPD, CAD/MI, dementia, CKD 3a, and history of afib.   Seen in ED On 03/05/23 with mid back pain s/p fall. CT showed T11 compression fracture. ED reviewed with Dr. Madaline Brilliant- he recommended TLSO brace and outpatient follow up.   She has intermittent mid back pain that is more of a soreness and is worse with walking. No arm or leg pain. No numbness, tingling, or weakness.   She is not wearing her TLSO brace- she hates it.   She is not taking norco 5/325 for pain. She is using lidoderm patches for pain.   She stays at [facility] on the memory unit. Her daughter helps with history as needed.   Bowel/Bladder Dysfunction: none   Review of Systems:  A 10 point review of systems is negative, except for the pertinent positives and negatives detailed in the HPI.  Past Medical History: Past Medical History:  Diagnosis Date   Anemia    Atrial fibrillation (HCC)    COPD (chronic obstructive pulmonary disease) (HCC)    Coronary artery disease 1995   s/p AMI , no history of stents,  [name]   Hyperlipidemia    Ischemic cardiomyopathy Dec 2011   ETT Sestamibi study apical scar, no ischemia, [name]   Myocardial infarction (HCC)    Recurrent HSV (herpes simplex virus)     Past Surgical History: Past Surgical History:  Procedure Laterality Date   ABDOMINAL HYSTERECTOMY  june 2014   BREAST EXCISIONAL BIOPSY Left 1990's   neg   CAROTID ENDARTERECTOMY  July 2007   Dew, right carotid   CATARACT EXTRACTION  2004, 2006   JOINT REPLACEMENT  July 2013   total hip , [place]   LAPAROSCOPIC APPENDECTOMY N/A 02/20/2020   Procedure: APPENDECTOMY LAPAROSCOPIC;  Surgeon: Leafy Ro, MD;  Location: [facility] ORS;  Service: General;  Laterality: N/A;    Allergies: Allergies as  of 04/08/2023 - Review Complete 03/05/2023  Allergen Reaction Noted   Azithromycin  07/31/2011   Clarithromycin  07/31/2011   Diclofenac  07/31/2011   Lisinopril Other (See Comments) 10/28/2013   Meloxicam  07/31/2011   Nitrofurantoin monohyd macro  07/31/2011   Zocor [simvastatin]  07/31/2011   Penicillins Rash 04/13/2015    Medications: Outpatient Encounter Medications as of 04/08/2023  Medication Sig   acyclovir (ZOVIRAX) 400 MG tablet Take 400 mg by mouth daily.   alendronate (FOSAMAX) 70 MG tablet Take 1 tablet by mouth every 7 days on an empty stomach with a full glass of water   ALPRAZolam (XANAX) 0.5 MG tablet Take 0.5 mg by mouth daily.   aspirin 81 MG tablet Take 81 mg by mouth at bedtime.   Cholecalciferol (VITAMIN D3) 75 MCG (3000 UT) TABS Take 1 tablet by mouth daily. Take one by mouth daily   citalopram (CELEXA) 20 MG tablet Take 1 tablet (20 mg total) by mouth daily.   cyanocobalamin 1000 MCG tablet Take one by mouth daily   HYDROcodone-acetaminophen (NORCO/VICODIN) 5-325 MG tablet Take 1 tablet by mouth every 6 (six) hours as needed for moderate pain.   lidocaine (XYLOCAINE) 5 % ointment Apply 1 Application topically as needed.   magnesium oxide (MAG-OX) 400 MG tablet Take 400 mg by mouth daily.   mirtazapine (REMERON) 7.5 MG tablet Take 1 tablet (7.5  mg total) by mouth at bedtime.   risperiDONE (RISPERDAL) 0.25 MG tablet Take 1 tablet (0.25 mg total) by mouth at bedtime.   rosuvastatin (CRESTOR) 20 MG tablet Take 1 tablet (20 mg total) by mouth daily.   telmisartan (MICARDIS) 20 MG tablet Take 1 tablet (20 mg total) by mouth daily.   timolol (TIMOPTIC) 0.5 % ophthalmic solution Place 1 drop into both eyes daily.   No facility-administered encounter medications on file as of 04/08/2023.    Social History: Social History   Tobacco Use   Smoking status: Former    Current packs/day: 0.00    Types: Cigarettes    Quit date: 01/13/1992    Years since quitting: 31.2    Smokeless tobacco: Never  Substance Use Topics   Alcohol use: No    Alcohol/week: 0.0 standard drinks of alcohol   Drug use: No    Family Medical History: Family History  Problem Relation Age of Onset   Cancer Mother 5       Pancreatic    Heart disease Father    Cancer Sister 51       Breast Cancer   Breast cancer Sister 24   Multiple sclerosis Daughter     Physical Examination: There were no vitals filed for this visit.  General: Patient is well developed, well nourished, calm, collected, and in no apparent distress. Attention to examination is appropriate.  Respiratory: Patient is breathing without any difficulty.   NEUROLOGICAL:     Awake, alert, oriented to person, place, and time.  Speech is clear and fluent. Fund of knowledge is appropriate.   Cranial Nerves: Pupils equal round and reactive to light.  Facial tone is symmetric.    She has no significant posterior TL tenderness. No tenderness at TL junction but she states this is where it hurts sometimes.   No abnormal lesions on exposed skin.   Strength: Side Biceps Triceps Deltoid Interossei Grip Wrist Ext. Wrist Flex.  R 5 5 5 5 5 5 5   L 5 5 5 5 5 5 5    Side Iliopsoas Quads Hamstring PF DF EHL  R 5 5 5 5 5 5   L 5 5 5 5 5 5    Reflexes are 2+ and symmetric at the biceps, brachioradialis, patella and achilles.   Hoffman's is absent.  Clonus is not present.   Bilateral upper and lower extremity sensation is intact to light touch.     Gait is slow.   She is not wearing her TLSO brace.    Medical Decision Making  Imaging: Xrays of lumbar spine dated 04/05/23:  Slight progression of T11 fracture in comparison to below CT of thoracic spine.    Radiology report for above xrays not available yet.    CT of thoracic spine dated 03/05/23:  FINDINGS: Alignment: Normal   Vertebrae: As noted on earlier abdominal CT, there is a biconcave compression fracture at T11 resulting in approximately 30% loss  of vertebral body height. This is new from 2021 and likely acute. There is mild osseous retropulsion without definite involvement of the posterior elements. No other acute fractures are identified.   Paraspinal and other soft tissues: Mild paraspinous edema at T11 supporting the probable acute nature of this fracture. There is also some subcutaneous edema in the lower back near the thoracolumbar junction. No focal paraspinal or epidural hematoma. Atherosclerosis of the aorta, great vessels and coronary arteries. Mild emphysematous changes are noted in the lungs. There is mild biapical scarring and  mild dependent atelectasis bilaterally.   Disc levels: The thoracic disc heights are preserved. There is mild multilevel paraspinal osteophyte formation without evidence of large disc herniation, significant spinal stenosis or significant foraminal narrowing   IMPRESSION: 1. Acute biconcave compression fracture at T11 with approximately 30% loss of vertebral body height and mild osseous retropulsion. No definite involvement of the posterior elements. 2. No other acute osseous findings. 3. No evidence of large disc herniation, significant spinal stenosis or significant foraminal narrowing. 4.  Aortic Atherosclerosis (ICD10-I70.0).     Electronically Signed   By: Carey Bullocks M.D.   On: 03/05/2023 15:14  I have personally reviewed the images and agree with the above interpretation.  Assessment and Plan: Ms. Baz is a pleasant 88 y.o. female has a T11 compression fracture s/p fall on 03/05/23. She feels like her pain is improving, but she still has intermittent mid back pain with walking. No arm or leg pain. No numbness, tingling, or weakness. She is not wearing her TLSO brace.   Xrays from today show some slight progression of T11 fracture in comparison to previous thoracic CT scan. Clinically, she looks good.   Treatment options discussed with patient and her daughter. Following  plan made:   - Recommend she wear TLSO brace when up and walking. She does not need to wear it to bed or when sitting and watching TV.  - No bending, twisting, or lifting.  - Continue with lidoderm patches prn pain.  - She does not think TLSO brace is fitting her correctly. She does not have brace with her today. She and her daughter were given number to [facility]. Advised to call them to make an appointment so they can check brace fit.  - Follow up with me in 1 month with repeat xrays.   Note to be faxed to [facility]. She stays on the memory unit  I spent a total of 30 minutes in face-to-face and non-face-to-face activities related to this patient's care today including review of outside records, review of imaging, review of symptoms, physical exam, discussion of differential diagnosis, discussion of treatment options, and documentation.   Thank you for involving me in the care of this patient.   Drake Leach PA-C Dept. of Neurosurgery

## 2023-04-05 NOTE — Telephone Encounter (Signed)
Please fax her office note from today to the  [facility]. She stays on the memory unit.   Thanks!

## 2023-04-06 DIAGNOSIS — I7091 Generalized atherosclerosis: Secondary | ICD-10-CM | POA: Diagnosis not present

## 2023-04-06 NOTE — Telephone Encounter (Signed)
Note faxed to [place] at [phone]

## 2023-04-07 ENCOUNTER — Ambulatory Visit: Payer: PPO | Admitting: Orthopedic Surgery

## 2023-04-08 ENCOUNTER — Ambulatory Visit: Payer: PPO | Admitting: Orthopedic Surgery

## 2023-04-13 ENCOUNTER — Ambulatory Visit: Payer: PPO | Admitting: Internal Medicine

## 2023-04-14 DIAGNOSIS — F02B2 Dementia in other diseases classified elsewhere, moderate, with psychotic disturbance: Secondary | ICD-10-CM | POA: Diagnosis not present

## 2023-04-14 DIAGNOSIS — F419 Anxiety disorder, unspecified: Secondary | ICD-10-CM | POA: Diagnosis not present

## 2023-04-17 DIAGNOSIS — B370 Candidal stomatitis: Secondary | ICD-10-CM | POA: Diagnosis not present

## 2023-04-21 DIAGNOSIS — I10 Essential (primary) hypertension: Secondary | ICD-10-CM | POA: Diagnosis not present

## 2023-04-23 DIAGNOSIS — E782 Mixed hyperlipidemia: Secondary | ICD-10-CM | POA: Diagnosis not present

## 2023-04-23 DIAGNOSIS — J449 Chronic obstructive pulmonary disease, unspecified: Secondary | ICD-10-CM | POA: Diagnosis not present

## 2023-04-23 DIAGNOSIS — D518 Other vitamin B12 deficiency anemias: Secondary | ICD-10-CM | POA: Diagnosis not present

## 2023-04-23 DIAGNOSIS — I10 Essential (primary) hypertension: Secondary | ICD-10-CM | POA: Diagnosis not present

## 2023-04-23 DIAGNOSIS — E119 Type 2 diabetes mellitus without complications: Secondary | ICD-10-CM | POA: Diagnosis not present

## 2023-04-23 DIAGNOSIS — E038 Other specified hypothyroidism: Secondary | ICD-10-CM | POA: Diagnosis not present

## 2023-04-23 DIAGNOSIS — E559 Vitamin D deficiency, unspecified: Secondary | ICD-10-CM | POA: Diagnosis not present

## 2023-04-27 ENCOUNTER — Telehealth: Payer: Self-pay | Admitting: Internal Medicine

## 2023-04-27 NOTE — Telephone Encounter (Signed)
Copied from CRM [id number]. Topic: Medicare AWV >> Apr 27, 2023 11:00 AM Payton Doughty wrote: Reason for CRM: Phone not in service 04/27/2023  Verlee Rossetti; Care Guide Ambulatory Clinical Support [place] [facility] Direct Dial: [phone]

## 2023-05-06 DIAGNOSIS — E785 Hyperlipidemia, unspecified: Secondary | ICD-10-CM | POA: Diagnosis not present

## 2023-05-06 DIAGNOSIS — F0390 Unspecified dementia without behavioral disturbance: Secondary | ICD-10-CM | POA: Diagnosis not present

## 2023-05-06 DIAGNOSIS — E46 Unspecified protein-calorie malnutrition: Secondary | ICD-10-CM | POA: Diagnosis not present

## 2023-05-06 DIAGNOSIS — J449 Chronic obstructive pulmonary disease, unspecified: Secondary | ICD-10-CM | POA: Diagnosis not present

## 2023-05-06 DIAGNOSIS — I480 Paroxysmal atrial fibrillation: Secondary | ICD-10-CM | POA: Diagnosis not present

## 2023-05-11 DIAGNOSIS — R011 Cardiac murmur, unspecified: Secondary | ICD-10-CM | POA: Diagnosis not present

## 2023-05-17 DIAGNOSIS — F419 Anxiety disorder, unspecified: Secondary | ICD-10-CM | POA: Diagnosis not present

## 2023-05-17 DIAGNOSIS — F4321 Adjustment disorder with depressed mood: Secondary | ICD-10-CM | POA: Diagnosis not present

## 2023-05-17 DIAGNOSIS — F02B2 Dementia in other diseases classified elsewhere, moderate, with psychotic disturbance: Secondary | ICD-10-CM | POA: Diagnosis not present

## 2023-05-21 DIAGNOSIS — I10 Essential (primary) hypertension: Secondary | ICD-10-CM | POA: Diagnosis not present

## 2023-05-25 ENCOUNTER — Ambulatory Visit: Payer: PPO | Admitting: Orthopedic Surgery

## 2023-05-26 DIAGNOSIS — E119 Type 2 diabetes mellitus without complications: Secondary | ICD-10-CM | POA: Diagnosis not present

## 2023-05-26 DIAGNOSIS — I10 Essential (primary) hypertension: Secondary | ICD-10-CM | POA: Diagnosis not present

## 2023-05-26 DIAGNOSIS — J449 Chronic obstructive pulmonary disease, unspecified: Secondary | ICD-10-CM | POA: Diagnosis not present

## 2023-05-26 DIAGNOSIS — E782 Mixed hyperlipidemia: Secondary | ICD-10-CM | POA: Diagnosis not present

## 2023-05-26 DIAGNOSIS — E038 Other specified hypothyroidism: Secondary | ICD-10-CM | POA: Diagnosis not present

## 2023-05-26 DIAGNOSIS — E559 Vitamin D deficiency, unspecified: Secondary | ICD-10-CM | POA: Diagnosis not present

## 2023-05-28 DIAGNOSIS — I70223 Atherosclerosis of native arteries of extremities with rest pain, bilateral legs: Secondary | ICD-10-CM | POA: Diagnosis not present

## 2023-05-31 DIAGNOSIS — R0989 Other specified symptoms and signs involving the circulatory and respiratory systems: Secondary | ICD-10-CM | POA: Diagnosis not present

## 2023-05-31 DIAGNOSIS — I6523 Occlusion and stenosis of bilateral carotid arteries: Secondary | ICD-10-CM | POA: Diagnosis not present

## 2023-06-02 DIAGNOSIS — I77811 Abdominal aortic ectasia: Secondary | ICD-10-CM | POA: Diagnosis not present

## 2023-06-07 DIAGNOSIS — J449 Chronic obstructive pulmonary disease, unspecified: Secondary | ICD-10-CM | POA: Diagnosis not present

## 2023-06-07 DIAGNOSIS — E785 Hyperlipidemia, unspecified: Secondary | ICD-10-CM | POA: Diagnosis not present

## 2023-06-07 DIAGNOSIS — I480 Paroxysmal atrial fibrillation: Secondary | ICD-10-CM | POA: Diagnosis not present

## 2023-06-07 DIAGNOSIS — I10 Essential (primary) hypertension: Secondary | ICD-10-CM | POA: Diagnosis not present

## 2023-06-07 DIAGNOSIS — F0390 Unspecified dementia without behavioral disturbance: Secondary | ICD-10-CM | POA: Diagnosis not present

## 2023-06-14 DIAGNOSIS — F419 Anxiety disorder, unspecified: Secondary | ICD-10-CM | POA: Diagnosis not present

## 2023-06-14 DIAGNOSIS — F4321 Adjustment disorder with depressed mood: Secondary | ICD-10-CM | POA: Diagnosis not present

## 2023-06-14 DIAGNOSIS — F03B18 Unspecified dementia, moderate, with other behavioral disturbance: Secondary | ICD-10-CM | POA: Diagnosis not present

## 2023-06-14 DIAGNOSIS — F03B2 Unspecified dementia, moderate, with psychotic disturbance: Secondary | ICD-10-CM | POA: Diagnosis not present

## 2023-06-21 DIAGNOSIS — Z79899 Other long term (current) drug therapy: Secondary | ICD-10-CM | POA: Diagnosis not present

## 2023-06-21 DIAGNOSIS — D519 Vitamin B12 deficiency anemia, unspecified: Secondary | ICD-10-CM | POA: Diagnosis not present

## 2023-06-21 DIAGNOSIS — E038 Other specified hypothyroidism: Secondary | ICD-10-CM | POA: Diagnosis not present

## 2023-06-21 DIAGNOSIS — E782 Mixed hyperlipidemia: Secondary | ICD-10-CM | POA: Diagnosis not present

## 2023-06-21 DIAGNOSIS — E119 Type 2 diabetes mellitus without complications: Secondary | ICD-10-CM | POA: Diagnosis not present

## 2023-06-22 DIAGNOSIS — J449 Chronic obstructive pulmonary disease, unspecified: Secondary | ICD-10-CM | POA: Diagnosis not present

## 2023-06-22 DIAGNOSIS — E559 Vitamin D deficiency, unspecified: Secondary | ICD-10-CM | POA: Diagnosis not present

## 2023-06-22 DIAGNOSIS — E119 Type 2 diabetes mellitus without complications: Secondary | ICD-10-CM | POA: Diagnosis not present

## 2023-06-22 DIAGNOSIS — I70223 Atherosclerosis of native arteries of extremities with rest pain, bilateral legs: Secondary | ICD-10-CM | POA: Diagnosis not present

## 2023-06-22 DIAGNOSIS — I10 Essential (primary) hypertension: Secondary | ICD-10-CM | POA: Diagnosis not present

## 2023-06-22 DIAGNOSIS — E782 Mixed hyperlipidemia: Secondary | ICD-10-CM | POA: Diagnosis not present

## 2023-06-22 DIAGNOSIS — D518 Other vitamin B12 deficiency anemias: Secondary | ICD-10-CM | POA: Diagnosis not present

## 2023-06-22 DIAGNOSIS — E038 Other specified hypothyroidism: Secondary | ICD-10-CM | POA: Diagnosis not present

## 2023-06-23 DIAGNOSIS — I10 Essential (primary) hypertension: Secondary | ICD-10-CM | POA: Diagnosis not present

## 2023-07-05 DIAGNOSIS — F0390 Unspecified dementia without behavioral disturbance: Secondary | ICD-10-CM | POA: Diagnosis not present

## 2023-07-05 DIAGNOSIS — R4189 Other symptoms and signs involving cognitive functions and awareness: Secondary | ICD-10-CM | POA: Diagnosis not present

## 2023-07-05 DIAGNOSIS — E785 Hyperlipidemia, unspecified: Secondary | ICD-10-CM | POA: Diagnosis not present

## 2023-07-05 DIAGNOSIS — I10 Essential (primary) hypertension: Secondary | ICD-10-CM | POA: Diagnosis not present

## 2023-07-05 DIAGNOSIS — J449 Chronic obstructive pulmonary disease, unspecified: Secondary | ICD-10-CM | POA: Diagnosis not present

## 2023-07-05 DIAGNOSIS — F411 Generalized anxiety disorder: Secondary | ICD-10-CM | POA: Diagnosis not present

## 2023-07-05 DIAGNOSIS — I480 Paroxysmal atrial fibrillation: Secondary | ICD-10-CM | POA: Diagnosis not present

## 2023-07-12 DIAGNOSIS — F419 Anxiety disorder, unspecified: Secondary | ICD-10-CM | POA: Diagnosis not present

## 2023-07-12 DIAGNOSIS — F03B18 Unspecified dementia, moderate, with other behavioral disturbance: Secondary | ICD-10-CM | POA: Diagnosis not present

## 2023-07-12 DIAGNOSIS — F03B2 Unspecified dementia, moderate, with psychotic disturbance: Secondary | ICD-10-CM | POA: Diagnosis not present

## 2023-07-12 DIAGNOSIS — F4321 Adjustment disorder with depressed mood: Secondary | ICD-10-CM | POA: Diagnosis not present

## 2023-07-23 DIAGNOSIS — I10 Essential (primary) hypertension: Secondary | ICD-10-CM | POA: Diagnosis not present

## 2023-07-24 DIAGNOSIS — E782 Mixed hyperlipidemia: Secondary | ICD-10-CM | POA: Diagnosis not present

## 2023-07-24 DIAGNOSIS — J449 Chronic obstructive pulmonary disease, unspecified: Secondary | ICD-10-CM | POA: Diagnosis not present

## 2023-07-24 DIAGNOSIS — D519 Vitamin B12 deficiency anemia, unspecified: Secondary | ICD-10-CM | POA: Diagnosis not present

## 2023-07-24 DIAGNOSIS — E559 Vitamin D deficiency, unspecified: Secondary | ICD-10-CM | POA: Diagnosis not present

## 2023-07-24 DIAGNOSIS — I10 Essential (primary) hypertension: Secondary | ICD-10-CM | POA: Diagnosis not present

## 2023-07-24 DIAGNOSIS — E038 Other specified hypothyroidism: Secondary | ICD-10-CM | POA: Diagnosis not present

## 2023-07-24 DIAGNOSIS — I70223 Atherosclerosis of native arteries of extremities with rest pain, bilateral legs: Secondary | ICD-10-CM | POA: Diagnosis not present

## 2023-07-24 DIAGNOSIS — E119 Type 2 diabetes mellitus without complications: Secondary | ICD-10-CM | POA: Diagnosis not present

## 2023-07-30 ENCOUNTER — Ambulatory Visit: Payer: PPO | Admitting: Internal Medicine

## 2023-07-30 DIAGNOSIS — R001 Bradycardia, unspecified: Secondary | ICD-10-CM | POA: Diagnosis not present

## 2023-07-30 DIAGNOSIS — E785 Hyperlipidemia, unspecified: Secondary | ICD-10-CM | POA: Diagnosis not present

## 2023-07-30 DIAGNOSIS — I214 Non-ST elevation (NSTEMI) myocardial infarction: Secondary | ICD-10-CM | POA: Diagnosis not present

## 2023-07-30 DIAGNOSIS — J449 Chronic obstructive pulmonary disease, unspecified: Secondary | ICD-10-CM | POA: Diagnosis not present

## 2023-07-30 DIAGNOSIS — I10 Essential (primary) hypertension: Secondary | ICD-10-CM | POA: Diagnosis not present

## 2023-07-30 DIAGNOSIS — R600 Localized edema: Secondary | ICD-10-CM | POA: Diagnosis not present

## 2023-07-30 DIAGNOSIS — N1831 Chronic kidney disease, stage 3a: Secondary | ICD-10-CM | POA: Diagnosis not present

## 2023-07-30 DIAGNOSIS — I255 Ischemic cardiomyopathy: Secondary | ICD-10-CM | POA: Diagnosis not present

## 2023-07-30 DIAGNOSIS — Z9889 Other specified postprocedural states: Secondary | ICD-10-CM | POA: Diagnosis not present

## 2023-07-30 NOTE — Progress Notes (Signed)
 Established Patient Visit   Chief Complaint: Chief Complaint  Patient presents with  . Follow-up    6 month    Date of Service: 07/30/2023 Date of Birth: Dec 22, 1934 PCP: Marylynn Verneita Kay, MD  History of Present Illness: [name] is a 89 y.o.female patient who returns for   1.  Non STEMI 06/30/1994 with occluded mid LAD, 50% distal RCA  2.  Ischemic cardiomyopathy  3.  Chronic systolic congestive heart failure   NYHA class I-II   ACC/AHA stage C   Etiology: CAD   HFpEF 50%  4.  Essential hypertension  5.  Hyperlipidemia  6.  Hyperkalemia on lisinopril   7.  CKD stage IIIa  8.  Sinus bradycardia  The patient returns today for follow-up, reports not doing well.  Patient fell and developed T11 compression fracture 03/05/2023, treated conservatively.  She denies chest pain or shortness of breath.  She has infrequent palpitations without heart racing.  She reports chronic peripheral edema.  She denies presyncope or syncope.  The patient is upset that she currently is residing at [facility].  She is less active, unable to exercise following T11 compression fracture.  2D echocardiogram 03/03/2016 revealed normal left ventricular function, with LVEF of 50%.  ETT sestamibi study 03/03/2016 revealed LV ejection fraction 59%, without evidence for scar or ischemia.  The patient has essential hypertension, blood pressure well-controlled, currently on telmisartan  and furosemide . She has a history of hyperkalemia while on lisinopril .  The patient follows a low-sodium, no added salt diet.   The patient has hyperlipidemia, LDL 47 on 04/27/2022, currently on rosuvastatin , which is well tolerated without apparent side effects, followed by her primary care provider.  The patient follows a low-cholesterol, low-fat diet.  Past Medical and Surgical History  Past Medical History Past Medical History:  Diagnosis Date  . Anemia   . Aneurysm (CMS-HCC)   . Cardiomyopathy, secondary (CMS/HHS-HCC)   . CHF  (congestive heart failure) (CMS/HHS-HCC)   . COPD (chronic obstructive pulmonary disease) (CMS/HHS-HCC)   . Coronary artery disease   . Cystocele   . Heart murmur   . Hyperglycemia   . Hyperlipidemia   . Hypertension   . Uterine prolapse     Past Surgical History She has a past surgical history that includes Carotid endarterectomy; left breast lumpectomy; cataract surgery (Bilateral); and Colonoscopy (07/23/2005).   Medications and Allergies  Current Medications  Current Outpatient Medications  Medication Sig Dispense Refill  . acyclovir  (ZOVIRAX ) 400 MG tablet Take 400 mg by mouth once daily       . alendronate  (FOSAMAX ) 70 MG tablet Take by mouth every 7 (seven) days    . aspirin  81 MG EC tablet Take 81 mg by mouth once daily.    . citalopram  (CELEXA ) 10 MG tablet Take 10 mg by mouth once daily 30 tablet 11  . FUROsemide  (LASIX ) 20 MG tablet     . lisinopriL  (ZESTRIL ) 5 MG tablet Take 5 mg by mouth once daily    . mirtazapine  (REMERON ) 7.5 MG tablet Take 7.5 mg by mouth at bedtime    . risperiDONE  (RISPERDAL ) 0.25 MG tablet Take 0.25 mg by mouth at bedtime    . rosuvastatin  (CRESTOR ) 20 MG tablet Take 1 tablet (20 mg total) by mouth once daily 30 tablet 11  . telmisartan  (MICARDIS ) 20 MG tablet Take 1 tablet (20 mg total) by mouth once daily 30 tablet 11  . timolol  maleate (TIMOPTIC ) 0.5 % ophthalmic solution     . albuterol   90 mcg/actuation inhaler Inhale into the lungs. (Patient not taking: Reported on 07/30/2023)    . ALPRAZolam  (XANAX ) 0.5 MG tablet Take 1 tablet (0.5 mg total) by mouth nightly as needed for Sleep (Patient not taking: Reported on 07/30/2023) 30 tablet 1  . cholecalciferol  1000 unit tablet Take by mouth (Patient not taking: Reported on 07/30/2023)    . cyanocobalamin  (VITAMIN B12) 1000 MCG tablet Take 1,000 mcg by mouth once daily (Patient not taking: Reported on 07/30/2023)    . ferrous sulfate  325 (65 FE) MG tablet Take 325 mg by mouth daily with breakfast (Patient  not taking: Reported on 07/30/2023)    . LYSINE ORAL Take by mouth (Patient not taking: Reported on 07/30/2023)    . magnesium  oxide (MAG-OX) 400 mg (241.3 mg magnesium ) tablet Take 400 mg by mouth once daily    . omega-3 fatty acids/fish oil 340-1,000 mg capsule Take 1 capsule by mouth 2 (two) times daily (Patient not taking: Reported on 07/30/2023)    . risperiDONE  (RISPERDAL ) 0.5 MG tablet Take 0.5 mg by mouth at bedtime as needed (Patient not taking: Reported on 07/30/2023) 30 tablet 0   No current facility-administered medications for this visit.    Allergies: Azithromycin, Biaxin [clarithromycin], Diclofenac, Lisinopril , Macrobid [nitrofurantoin monohyd/m-cryst], Meloxicam, Voltaren [diclofenac sodium], and Zocor [simvastatin]  Social and Family History  Social History  reports that she has quit smoking. She has never used smokeless tobacco. She reports current alcohol use.  Family History Family History  Problem Relation Name Age of Onset  . Coronary Artery Disease (Blocked arteries around heart) Mother    . Breast cancer Mother    . Pancreatic cancer Mother    . Breast cancer Sister      Review of Systems   Review of Systems: The patient denies chest pain, shortness of breath, orthopnea, paroxysmal nocturnal dyspnea, pedal edema, with infrequent palpitations, without heart racing, presyncope, syncope, with generalized fatigue, with mood changes. Review of 8 Systems is negative except as described above.  Physical Examination   Vitals: BP 124/68   Pulse 63   Ht 160 cm (5' 3)   Wt 58.7 kg (129 lb 6.4 oz)   SpO2 94%   BMI 22.92 kg/m  Ht:160 cm (5' 3) Wt:58.7 kg (129 lb 6.4 oz) ADJ:Anib surface area is 1.62 meters squared. Body mass index is 22.92 kg/m.  General: Alert and oriented. Well-appearing. No acute distress. HEENT: Pupils equally reactive to light and accomodation    Neck: no JVD Lungs: Normal effort of breathing; clear to auscultation bilaterally; no wheezes,  rales, rhonchi Heart: Regular rate and rhythm. No murmur, rub, or gallop Abdomen: soft nontender, nondistended Extremities: no cyanosis, clubbing, or edema Peripheral Pulses: 2+ radial  Skin: Warm, dry, no diaphoresis  Assessment   89 y.o. female with  1. Ischemic cardiomyopathy   2. Non-ST elevation myocardial infarction (NSTEMI) (CMS/HHS-HCC)   3. H/O cardiac catheterization   4. H/O carotid endarterectomy   5. Primary hypertension   6. Bradycardia   7. Chronic obstructive pulmonary disease, unspecified COPD type (CMS/HHS-HCC)   8. Hyperlipidemia, unspecified hyperlipidemia type   9. Stage 3a chronic kidney disease (CMS/HHS-HCC)   10. Lower extremity edema    89 year old female with known coronary artery disease, with history of ischemic cardiomyopathy, chronic diastolic congestive heart failure without chest pain or shortness of breath. 2D echocardiogram revealed normal left ventricular function.  ETT sestamibi study did not reveal evidence for significant scar or ischemia.  The patient has essential hypertension,  blood pressure low today on current medications.  She has chronic bradycardia, not on AV nodal blocking agents, and appears asymptomatic.  Plan   1.  Continue current medications 2.  Counseled patient about low-sodium diet 3.  DASH diet printed instructions given to the patient 4.  Counseled patient about low-cholesterol diet 5.  Continue pravastatin  for hyperlipidemia management 6.  Low-fat and cholesterol diet printed instructions given to the patient 7.  Defer permanent pacemaker implantation at this time 8.  Return to clinic for follow-up in 6 months   No orders of the defined types were placed in this encounter.   Return in about 4 months (around 11/27/2023).  MARSA DOOMS, MD PhD [facility]

## 2023-08-02 DIAGNOSIS — D508 Other iron deficiency anemias: Secondary | ICD-10-CM | POA: Diagnosis not present

## 2023-08-02 DIAGNOSIS — E785 Hyperlipidemia, unspecified: Secondary | ICD-10-CM | POA: Diagnosis not present

## 2023-08-02 DIAGNOSIS — I480 Paroxysmal atrial fibrillation: Secondary | ICD-10-CM | POA: Diagnosis not present

## 2023-08-02 DIAGNOSIS — F411 Generalized anxiety disorder: Secondary | ICD-10-CM | POA: Diagnosis not present

## 2023-08-02 DIAGNOSIS — I10 Essential (primary) hypertension: Secondary | ICD-10-CM | POA: Diagnosis not present

## 2023-08-02 DIAGNOSIS — F0390 Unspecified dementia without behavioral disturbance: Secondary | ICD-10-CM | POA: Diagnosis not present

## 2023-08-02 DIAGNOSIS — E46 Unspecified protein-calorie malnutrition: Secondary | ICD-10-CM | POA: Diagnosis not present

## 2023-08-02 DIAGNOSIS — R4189 Other symptoms and signs involving cognitive functions and awareness: Secondary | ICD-10-CM | POA: Diagnosis not present

## 2023-08-02 DIAGNOSIS — J449 Chronic obstructive pulmonary disease, unspecified: Secondary | ICD-10-CM | POA: Diagnosis not present

## 2023-08-05 DIAGNOSIS — B351 Tinea unguium: Secondary | ICD-10-CM | POA: Diagnosis not present

## 2023-08-05 DIAGNOSIS — I7091 Generalized atherosclerosis: Secondary | ICD-10-CM | POA: Diagnosis not present

## 2023-08-16 DIAGNOSIS — F419 Anxiety disorder, unspecified: Secondary | ICD-10-CM | POA: Diagnosis not present

## 2023-08-16 DIAGNOSIS — F03B18 Unspecified dementia, moderate, with other behavioral disturbance: Secondary | ICD-10-CM | POA: Diagnosis not present

## 2023-08-16 DIAGNOSIS — F4321 Adjustment disorder with depressed mood: Secondary | ICD-10-CM | POA: Diagnosis not present

## 2023-08-16 DIAGNOSIS — F03B2 Unspecified dementia, moderate, with psychotic disturbance: Secondary | ICD-10-CM | POA: Diagnosis not present

## 2023-08-22 DIAGNOSIS — I10 Essential (primary) hypertension: Secondary | ICD-10-CM | POA: Diagnosis not present

## 2023-08-23 DIAGNOSIS — R7309 Other abnormal glucose: Secondary | ICD-10-CM | POA: Diagnosis not present

## 2023-08-23 DIAGNOSIS — D519 Vitamin B12 deficiency anemia, unspecified: Secondary | ICD-10-CM | POA: Diagnosis not present

## 2023-08-23 DIAGNOSIS — Z79899 Other long term (current) drug therapy: Secondary | ICD-10-CM | POA: Diagnosis not present

## 2023-08-23 DIAGNOSIS — E782 Mixed hyperlipidemia: Secondary | ICD-10-CM | POA: Diagnosis not present

## 2023-08-24 ENCOUNTER — Emergency Department: Payer: PPO

## 2023-08-24 ENCOUNTER — Other Ambulatory Visit: Payer: Self-pay

## 2023-08-24 ENCOUNTER — Inpatient Hospital Stay
Admission: EM | Admit: 2023-08-24 | Discharge: 2023-09-03 | DRG: 391 | Disposition: A | Discharge status: Home Health | Payer: PPO | Source: Ambulatory Visit | Attending: Student | Admitting: Student

## 2023-08-24 DIAGNOSIS — Z90710 Acquired absence of both cervix and uterus: Secondary | ICD-10-CM

## 2023-08-24 DIAGNOSIS — I5032 Chronic diastolic (congestive) heart failure: Secondary | ICD-10-CM | POA: Diagnosis not present

## 2023-08-24 DIAGNOSIS — Z8249 Family history of ischemic heart disease and other diseases of the circulatory system: Secondary | ICD-10-CM

## 2023-08-24 DIAGNOSIS — Z880 Allergy status to penicillin: Secondary | ICD-10-CM

## 2023-08-24 DIAGNOSIS — Z87891 Personal history of nicotine dependence: Secondary | ICD-10-CM

## 2023-08-24 DIAGNOSIS — R059 Cough, unspecified: Secondary | ICD-10-CM

## 2023-08-24 DIAGNOSIS — E876 Hypokalemia: Secondary | ICD-10-CM | POA: Diagnosis not present

## 2023-08-24 DIAGNOSIS — Z800 Family history of malignant neoplasm of digestive organs: Secondary | ICD-10-CM

## 2023-08-24 DIAGNOSIS — J209 Acute bronchitis, unspecified: Secondary | ICD-10-CM | POA: Diagnosis present

## 2023-08-24 DIAGNOSIS — R112 Nausea with vomiting, unspecified: Principal | ICD-10-CM

## 2023-08-24 DIAGNOSIS — A419 Sepsis, unspecified organism: Secondary | ICD-10-CM | POA: Diagnosis not present

## 2023-08-24 DIAGNOSIS — I480 Paroxysmal atrial fibrillation: Secondary | ICD-10-CM | POA: Diagnosis present

## 2023-08-24 DIAGNOSIS — E861 Hypovolemia: Secondary | ICD-10-CM | POA: Diagnosis not present

## 2023-08-24 DIAGNOSIS — I5030 Unspecified diastolic (congestive) heart failure: Secondary | ICD-10-CM | POA: Diagnosis not present

## 2023-08-24 DIAGNOSIS — R051 Acute cough: Secondary | ICD-10-CM | POA: Diagnosis not present

## 2023-08-24 DIAGNOSIS — J690 Pneumonitis due to inhalation of food and vomit: Secondary | ICD-10-CM | POA: Diagnosis not present

## 2023-08-24 DIAGNOSIS — R001 Bradycardia, unspecified: Secondary | ICD-10-CM | POA: Insufficient documentation

## 2023-08-24 DIAGNOSIS — R571 Hypovolemic shock: Secondary | ICD-10-CM | POA: Diagnosis present

## 2023-08-24 DIAGNOSIS — D519 Vitamin B12 deficiency anemia, unspecified: Secondary | ICD-10-CM | POA: Diagnosis not present

## 2023-08-24 DIAGNOSIS — Z7982 Long term (current) use of aspirin: Secondary | ICD-10-CM

## 2023-08-24 DIAGNOSIS — I700 Atherosclerosis of aorta: Secondary | ICD-10-CM | POA: Diagnosis not present

## 2023-08-24 DIAGNOSIS — E1122 Type 2 diabetes mellitus with diabetic chronic kidney disease: Secondary | ICD-10-CM | POA: Diagnosis present

## 2023-08-24 DIAGNOSIS — J440 Chronic obstructive pulmonary disease with acute lower respiratory infection: Secondary | ICD-10-CM | POA: Diagnosis present

## 2023-08-24 DIAGNOSIS — G9341 Metabolic encephalopathy: Secondary | ICD-10-CM | POA: Diagnosis not present

## 2023-08-24 DIAGNOSIS — I252 Old myocardial infarction: Secondary | ICD-10-CM

## 2023-08-24 DIAGNOSIS — Z79624 Long term (current) use of inhibitors of nucleotide synthesis: Secondary | ICD-10-CM

## 2023-08-24 DIAGNOSIS — Z9049 Acquired absence of other specified parts of digestive tract: Secondary | ICD-10-CM

## 2023-08-24 DIAGNOSIS — I951 Orthostatic hypotension: Secondary | ICD-10-CM | POA: Diagnosis present

## 2023-08-24 DIAGNOSIS — Z886 Allergy status to analgesic agent status: Secondary | ICD-10-CM

## 2023-08-24 DIAGNOSIS — M4854XA Collapsed vertebra, not elsewhere classified, thoracic region, initial encounter for fracture: Secondary | ICD-10-CM | POA: Diagnosis present

## 2023-08-24 DIAGNOSIS — I130 Hypertensive heart and chronic kidney disease with heart failure and stage 1 through stage 4 chronic kidney disease, or unspecified chronic kidney disease: Secondary | ICD-10-CM | POA: Diagnosis present

## 2023-08-24 DIAGNOSIS — Z803 Family history of malignant neoplasm of breast: Secondary | ICD-10-CM

## 2023-08-24 DIAGNOSIS — N179 Acute kidney failure, unspecified: Secondary | ICD-10-CM | POA: Diagnosis present

## 2023-08-24 DIAGNOSIS — J9601 Acute respiratory failure with hypoxia: Secondary | ICD-10-CM | POA: Diagnosis present

## 2023-08-24 DIAGNOSIS — N189 Chronic kidney disease, unspecified: Secondary | ICD-10-CM | POA: Diagnosis present

## 2023-08-24 DIAGNOSIS — E782 Mixed hyperlipidemia: Secondary | ICD-10-CM | POA: Diagnosis not present

## 2023-08-24 DIAGNOSIS — N1831 Chronic kidney disease, stage 3a: Secondary | ICD-10-CM | POA: Diagnosis present

## 2023-08-24 DIAGNOSIS — E860 Dehydration: Secondary | ICD-10-CM | POA: Diagnosis present

## 2023-08-24 DIAGNOSIS — I10 Essential (primary) hypertension: Secondary | ICD-10-CM | POA: Diagnosis not present

## 2023-08-24 DIAGNOSIS — Z9842 Cataract extraction status, left eye: Secondary | ICD-10-CM

## 2023-08-24 DIAGNOSIS — R0602 Shortness of breath: Secondary | ICD-10-CM | POA: Diagnosis not present

## 2023-08-24 DIAGNOSIS — E119 Type 2 diabetes mellitus without complications: Secondary | ICD-10-CM | POA: Diagnosis not present

## 2023-08-24 DIAGNOSIS — K529 Noninfective gastroenteritis and colitis, unspecified: Secondary | ICD-10-CM | POA: Diagnosis not present

## 2023-08-24 DIAGNOSIS — R1110 Vomiting, unspecified: Secondary | ICD-10-CM | POA: Diagnosis not present

## 2023-08-24 DIAGNOSIS — I70223 Atherosclerosis of native arteries of extremities with rest pain, bilateral legs: Secondary | ICD-10-CM | POA: Diagnosis not present

## 2023-08-24 DIAGNOSIS — Z7983 Long term (current) use of bisphosphonates: Secondary | ICD-10-CM

## 2023-08-24 DIAGNOSIS — E038 Other specified hypothyroidism: Secondary | ICD-10-CM | POA: Diagnosis not present

## 2023-08-24 DIAGNOSIS — J449 Chronic obstructive pulmonary disease, unspecified: Secondary | ICD-10-CM | POA: Diagnosis not present

## 2023-08-24 DIAGNOSIS — I9589 Other hypotension: Secondary | ICD-10-CM | POA: Diagnosis present

## 2023-08-24 DIAGNOSIS — R531 Weakness: Secondary | ICD-10-CM | POA: Diagnosis not present

## 2023-08-24 DIAGNOSIS — E875 Hyperkalemia: Secondary | ICD-10-CM | POA: Diagnosis not present

## 2023-08-24 DIAGNOSIS — R5381 Other malaise: Secondary | ICD-10-CM | POA: Diagnosis present

## 2023-08-24 DIAGNOSIS — Z1152 Encounter for screening for COVID-19: Secondary | ICD-10-CM

## 2023-08-24 DIAGNOSIS — Z888 Allergy status to other drugs, medicaments and biological substances status: Secondary | ICD-10-CM

## 2023-08-24 DIAGNOSIS — I2510 Atherosclerotic heart disease of native coronary artery without angina pectoris: Secondary | ICD-10-CM | POA: Diagnosis not present

## 2023-08-24 DIAGNOSIS — F32A Depression, unspecified: Secondary | ICD-10-CM | POA: Diagnosis present

## 2023-08-24 DIAGNOSIS — Z79899 Other long term (current) drug therapy: Secondary | ICD-10-CM

## 2023-08-24 DIAGNOSIS — K5730 Diverticulosis of large intestine without perforation or abscess without bleeding: Secondary | ICD-10-CM | POA: Diagnosis not present

## 2023-08-24 DIAGNOSIS — I429 Cardiomyopathy, unspecified: Secondary | ICD-10-CM | POA: Diagnosis not present

## 2023-08-24 DIAGNOSIS — Z9861 Coronary angioplasty status: Secondary | ICD-10-CM

## 2023-08-24 DIAGNOSIS — F03A11 Unspecified dementia, mild, with agitation: Secondary | ICD-10-CM | POA: Diagnosis present

## 2023-08-24 DIAGNOSIS — I517 Cardiomegaly: Secondary | ICD-10-CM | POA: Diagnosis not present

## 2023-08-24 DIAGNOSIS — R197 Diarrhea, unspecified: Secondary | ICD-10-CM | POA: Diagnosis not present

## 2023-08-24 DIAGNOSIS — R918 Other nonspecific abnormal finding of lung field: Secondary | ICD-10-CM | POA: Diagnosis not present

## 2023-08-24 DIAGNOSIS — E785 Hyperlipidemia, unspecified: Secondary | ICD-10-CM | POA: Diagnosis present

## 2023-08-24 DIAGNOSIS — F03A3 Unspecified dementia, mild, with mood disturbance: Secondary | ICD-10-CM | POA: Diagnosis present

## 2023-08-24 DIAGNOSIS — E559 Vitamin D deficiency, unspecified: Secondary | ICD-10-CM | POA: Diagnosis not present

## 2023-08-24 DIAGNOSIS — B349 Viral infection, unspecified: Secondary | ICD-10-CM | POA: Diagnosis present

## 2023-08-24 DIAGNOSIS — Z881 Allergy status to other antibiotic agents status: Secondary | ICD-10-CM

## 2023-08-24 DIAGNOSIS — I959 Hypotension, unspecified: Secondary | ICD-10-CM | POA: Diagnosis present

## 2023-08-24 DIAGNOSIS — I255 Ischemic cardiomyopathy: Secondary | ICD-10-CM | POA: Diagnosis present

## 2023-08-24 DIAGNOSIS — F03911 Unspecified dementia, unspecified severity, with agitation: Secondary | ICD-10-CM | POA: Diagnosis not present

## 2023-08-24 DIAGNOSIS — Z9889 Other specified postprocedural states: Secondary | ICD-10-CM

## 2023-08-24 DIAGNOSIS — Z9841 Cataract extraction status, right eye: Secondary | ICD-10-CM

## 2023-08-24 DIAGNOSIS — I5033 Acute on chronic diastolic (congestive) heart failure: Secondary | ICD-10-CM | POA: Diagnosis present

## 2023-08-24 DIAGNOSIS — K8020 Calculus of gallbladder without cholecystitis without obstruction: Secondary | ICD-10-CM | POA: Diagnosis not present

## 2023-08-24 DIAGNOSIS — D631 Anemia in chronic kidney disease: Secondary | ICD-10-CM | POA: Diagnosis present

## 2023-08-24 DIAGNOSIS — J9811 Atelectasis: Secondary | ICD-10-CM | POA: Diagnosis not present

## 2023-08-24 DIAGNOSIS — Z820 Family history of epilepsy and other diseases of the nervous system: Secondary | ICD-10-CM

## 2023-08-24 DIAGNOSIS — Z96649 Presence of unspecified artificial hip joint: Secondary | ICD-10-CM | POA: Diagnosis present

## 2023-08-24 DIAGNOSIS — Z8619 Personal history of other infectious and parasitic diseases: Secondary | ICD-10-CM

## 2023-08-24 DIAGNOSIS — Z66 Do not resuscitate: Secondary | ICD-10-CM | POA: Diagnosis present

## 2023-08-24 LAB — COMPREHENSIVE METABOLIC PANEL
ALT: 12 U/L (ref 0–44)
AST: 18 U/L (ref 15–41)
Albumin: 3.4 g/dL — ABNORMAL LOW (ref 3.5–5.0)
Alkaline Phosphatase: 49 U/L (ref 38–126)
Anion gap: 15 (ref 5–15)
BUN: 62 mg/dL — ABNORMAL HIGH (ref 8–23)
CO2: 20 mmol/L — ABNORMAL LOW (ref 22–32)
Calcium: 10 mg/dL (ref 8.9–10.3)
Chloride: 99 mmol/L (ref 98–111)
Creatinine, Ser: 2.03 mg/dL — ABNORMAL HIGH (ref 0.44–1.00)
GFR, Estimated: 23 mL/min — ABNORMAL LOW (ref >60)
Glucose, Bld: 197 mg/dL — ABNORMAL HIGH (ref 70–99)
Potassium: 5.8 mmol/L — ABNORMAL HIGH (ref 3.5–5.1)
Sodium: 134 mmol/L — ABNORMAL LOW (ref 135–145)
Total Bilirubin: 0.7 mg/dL (ref 0.0–1.2)
Total Protein: 6.8 g/dL (ref 6.5–8.1)

## 2023-08-24 LAB — CBC WITH DIFFERENTIAL/PLATELET
Abs Immature Granulocytes: 0 10*3/uL (ref 0.00–0.07)
Basophils Absolute: 0 10*3/uL (ref 0.0–0.1)
Basophils Relative: 0 %
Eosinophils Absolute: 0 10*3/uL (ref 0.0–0.5)
Eosinophils Relative: 0 %
HCT: 34.3 % — ABNORMAL LOW (ref 36.0–46.0)
Hemoglobin: 11.1 g/dL — ABNORMAL LOW (ref 12.0–15.0)
Lymphocytes Relative: 9 %
Lymphs Abs: 2.2 10*3/uL (ref 0.7–4.0)
MCH: 30.7 pg (ref 26.0–34.0)
MCHC: 32.4 g/dL (ref 30.0–36.0)
MCV: 94.8 fL (ref 80.0–100.0)
Monocytes Absolute: 0.7 10*3/uL (ref 0.1–1.0)
Monocytes Relative: 3 %
Neutro Abs: 21.3 10*3/uL — ABNORMAL HIGH (ref 1.7–7.7)
Neutrophils Relative %: 88 %
Platelets: 170 10*3/uL (ref 150–400)
RBC: 3.62 MIL/uL — ABNORMAL LOW (ref 3.87–5.11)
RDW: 12.7 % (ref 11.5–15.5)
Smear Review: NORMAL
WBC: 24.2 10*3/uL — ABNORMAL HIGH (ref 4.0–10.5)
nRBC: 0 % (ref 0.0–0.2)

## 2023-08-24 LAB — LACTIC ACID, PLASMA
Lactic Acid, Venous: 2.1 mmol/L (ref 0.5–1.9)
Lactic Acid, Venous: 1.6 mmol/L (ref 0.5–1.9)

## 2023-08-24 LAB — LIPASE, BLOOD: Lipase: 29 U/L (ref 11–51)

## 2023-08-24 LAB — PROTIME-INR
INR: 1.1 (ref 0.8–1.2)
Prothrombin Time: 14.6 s (ref 11.4–15.2)

## 2023-08-24 LAB — SARS CORONAVIRUS 2 BY RT PCR: SARS Coronavirus 2 by RT PCR: NEGATIVE

## 2023-08-24 MED ORDER — ONDANSETRON HCL 4 MG/2ML IJ SOLN
4.0000 mg | Freq: Once | INTRAMUSCULAR | Status: AC
  Administered 2023-08-24: 4 mg via INTRAVENOUS
  Filled 2023-08-24: qty 2

## 2023-08-24 MED ORDER — ONDANSETRON HCL 4 MG PO TABS: 4.0000 mg | ORAL_TABLET | Freq: Four times a day (QID) | ORAL | Status: DC | PRN

## 2023-08-24 MED ORDER — LACTATED RINGERS IV SOLN: INTRAVENOUS | Status: AC

## 2023-08-24 MED ORDER — LACTATED RINGERS IV BOLUS
1000.0000 mL | Freq: Once | INTRAVENOUS | Status: AC
  Administered 2023-08-24: 1000 mL via INTRAVENOUS

## 2023-08-24 MED ORDER — ENOXAPARIN SODIUM 30 MG/0.3ML IJ SOSY
30.0000 mg | PREFILLED_SYRINGE | INTRAMUSCULAR | Status: DC
  Administered 2023-08-24 – 2023-09-02 (×10): 30 mg via SUBCUTANEOUS
  Filled 2023-08-24 (×10): qty 0.3

## 2023-08-24 MED ORDER — SODIUM CHLORIDE 0.9 % IV BOLUS
1000.0000 mL | Freq: Once | INTRAVENOUS | Status: AC
  Administered 2023-08-24: 1000 mL via INTRAVENOUS

## 2023-08-24 MED ORDER — ACETAMINOPHEN 650 MG RE SUPP: 650.0000 mg | Freq: Four times a day (QID) | RECTAL | Status: DC | PRN

## 2023-08-24 MED ORDER — GUAIFENESIN ER 600 MG PO TB12
600.0000 mg | ORAL_TABLET | Freq: Two times a day (BID) | ORAL | Status: DC
  Administered 2023-08-24 – 2023-09-03 (×19): 600 mg via ORAL
  Filled 2023-08-24 (×19): qty 1

## 2023-08-24 MED ORDER — ONDANSETRON HCL 4 MG/2ML IJ SOLN: 4.0000 mg | Freq: Four times a day (QID) | INTRAMUSCULAR | Status: DC | PRN

## 2023-08-24 MED ORDER — SODIUM CHLORIDE 0.9% FLUSH
3.0000 mL | Freq: Two times a day (BID) | INTRAVENOUS | Status: DC
  Administered 2023-08-24 – 2023-09-03 (×19): 3 mL via INTRAVENOUS

## 2023-08-24 MED ORDER — ACETAMINOPHEN 325 MG PO TABS: 650.0000 mg | ORAL_TABLET | Freq: Four times a day (QID) | ORAL | Status: DC | PRN

## 2023-08-24 NOTE — ED Triage Notes (Signed)
First nurse note:Brought over from [facility] for low blood pressure. Reports N/V/D for days  [facility] vitals: 79/49b/p 77P 95% RA

## 2023-08-24 NOTE — Assessment & Plan Note (Signed)
Currently in sinus rhythm. No longer on anticoagulation since 2013 per chart review.

## 2023-08-24 NOTE — ED Notes (Signed)
Pt transported to xray

## 2023-08-24 NOTE — ED Triage Notes (Signed)
Pt to ED from [facility] see first nurse note. Pt c/o NVD since 3 days or so and is feeling generally weak. BP at [facility] was 70s/40s.  Pt denies pain. PA at bedside. BP is 91/36 in triage with MAP of 40, first nurse notified. BP re taken, 76/57 with MAP of 66.  Pt lives at [facility] [address]

## 2023-08-24 NOTE — ED Notes (Signed)
Pt given gingerale and crackers

## 2023-08-24 NOTE — Assessment & Plan Note (Signed)
-   Hold home antihypertensives in the setting of hypotension and AKI

## 2023-08-24 NOTE — Assessment & Plan Note (Addendum)
-   Continue home aspirin and statin.

## 2023-08-24 NOTE — ED Notes (Signed)
Pt transported to CT ?

## 2023-08-24 NOTE — Assessment & Plan Note (Signed)
In the setting of GI illness.  - S/p 2 L bolus - Continue maintenance fluids - Hold home nephrotoxic agents - Recheck BMP in the a.m. - Strict in and out

## 2023-08-24 NOTE — Assessment & Plan Note (Signed)
Patient appears at baseline at this time.  - Continue home regimen - Delirium precautions

## 2023-08-24 NOTE — Assessment & Plan Note (Signed)
Likely acute bronchitis in the setting of underlying viral illness.  Chest x-ray with no evidence of pneumonia.  - Supportive management - Respiratory viral panel pending

## 2023-08-24 NOTE — Assessment & Plan Note (Signed)
Hypovolemic hypotension in the setting of severe vomiting and diarrhea.  Blood pressure has improved with IV fluid resuscitation.  - Continue maintenance fluids - Hold home antihypertensives

## 2023-08-24 NOTE — H&P (Signed)
History and Physical    Patient: Audrey Duran NWG:[id number] DOB: 01/11/1935 DOA: 08/24/2023 DOS: the patient was seen and examined on 08/24/2023 PCP: Sherlene Shams, MD  Patient coming from: ALF/ILF  Chief Complaint:  Chief Complaint  Patient presents with   Weakness   Vomiting   Diarrhea   HPI: Audrey Duran is a 89 y.o. female with medical history significant of dementia, CAD s/p PCI (1995), chronic systolic CHF, hypertension, hyperlipidemia, CKD stage IIIa, hyperkalemia 2/2 lisinopril, type 2 diabetes, COPD, who presents to the ED due to nausea/vomiting/diarrhea and weakness.  History obtained from both patient and her daughter at bedside. Audrey Duran states she has been experiencing severe nausea and vomiting for 2-3 days now.  She notes that she has not eaten in 2 days but has been trying to drink some ginger ale.  She additionally endorses diarrhea, however her daughter states facility has not reported any diarrhea.  Today she went to her PCPs office and was noted to be very weak and hypotensive.  Due to this, she was sent to the ED.  Audrey Duran endorses a cough but is not sure when it started, her daughter states it was not present 4 days ago.  She denies any shortness of breath.  ED course: On arrival to the ED, patient was hypotensive at 91/36 with MAP of 49.  She was saturating at 93% on room air.  She was afebrile at 97.8.Initial workup notable for WBC of 24.2, hemoglobin 11.1, potassium 5.8, glucose 197, BUN 62, creatinine 2.03 with GFR of 23.  Lactic acid 2.1.  CT of the abdomen was obtained with no acute abnormalities.  Patient started on IV fluids and Zofran; [facility] contacted for admission.  Review of Systems: As mentioned in the history of present illness. All other systems reviewed and are negative.  Past Medical History:  Diagnosis Date   Anemia    Atrial fibrillation (HCC)    COPD (chronic obstructive pulmonary disease) (HCC)    Coronary artery disease 1995    s/p AMI , no history of stents,  [name]   Hyperlipidemia    Ischemic cardiomyopathy Dec 2011   ETT Sestamibi study apical scar, no ischemia, [name]   Myocardial infarction (HCC)    Recurrent HSV (herpes simplex virus)    Past Surgical History:  Procedure Laterality Date   ABDOMINAL HYSTERECTOMY  june 2014   BREAST EXCISIONAL BIOPSY Left 1990's   neg   CAROTID ENDARTERECTOMY  July 2007   Dew, right carotid   CATARACT EXTRACTION  2004, 2006   JOINT REPLACEMENT  July 2013   total hip , [place]   LAPAROSCOPIC APPENDECTOMY N/A 02/20/2020   Procedure: APPENDECTOMY LAPAROSCOPIC;  Surgeon: Leafy Ro, MD;  Location: [facility] ORS;  Service: General;  Laterality: N/A;   Social History:  reports that she quit smoking about 31 years ago. Her smoking use included cigarettes. She has never used smokeless tobacco. She reports that she does not drink alcohol and does not use drugs.  Allergies  Allergen Reactions   Azithromycin     GI upset   Clarithromycin     ? Rash   Diclofenac     ? Rash   Lisinopril Other (See Comments)    hyperkalemia   Meloxicam     ? Rash   Nitrofurantoin Monohyd Macro     ? rash   Zocor [Simvastatin]    Penicillins Rash    Family History  Problem Relation Age of Onset   Cancer  Mother 9       Pancreatic    Heart disease Father    Cancer Sister 94       Breast Cancer   Breast cancer Sister 36   Multiple sclerosis Daughter     Prior to Admission medications   Medication Sig Start Date End Date Taking? Authorizing Provider  acyclovir (ZOVIRAX) 400 MG tablet Take 400 mg by mouth daily. 02/09/23   [provider]  alendronate (FOSAMAX) 70 MG tablet Take 1 tablet by mouth every 7 days on an empty stomach with a full glass of water 04/27/22   Sherlene Shams, MD  ALPRAZolam Prudy Feeler) 0.5 MG tablet Take 0.5 mg by mouth daily. 01/12/23   [provider]  aspirin 81 MG tablet Take 81 mg by mouth at bedtime.    [provider]   Cholecalciferol (VITAMIN D3) 75 MCG (3000 UT) TABS Take 1 tablet by mouth daily. Take one by mouth daily 04/27/22   Sherlene Shams, MD  citalopram (CELEXA) 20 MG tablet Take 1 tablet (20 mg total) by mouth daily. 01/11/23   Sherlene Shams, MD  cyanocobalamin 1000 MCG tablet Take one by mouth daily    [provider]  HYDROcodone-acetaminophen (NORCO/VICODIN) 5-325 MG tablet Take 1 tablet by mouth every 6 (six) hours as needed for moderate pain. 03/06/23   Marrion Coy, MD  lidocaine (XYLOCAINE) 5 % ointment Apply 1 Application topically as needed. 03/06/23   Marrion Coy, MD  magnesium oxide (MAG-OX) 400 MG tablet Take 400 mg by mouth daily.    [provider]  mirtazapine (REMERON) 7.5 MG tablet Take 1 tablet (7.5 mg total) by mouth at bedtime. 10/12/22 03/06/23  Chesley Noon, MD  risperiDONE (RISPERDAL) 0.25 MG tablet Take 1 tablet (0.25 mg total) by mouth at bedtime. 10/12/22 03/06/23  Chesley Noon, MD  rosuvastatin (CRESTOR) 20 MG tablet Take 1 tablet (20 mg total) by mouth daily. 04/27/22   Sherlene Shams, MD  telmisartan (MICARDIS) 20 MG tablet Take 1 tablet (20 mg total) by mouth daily. 10/12/22 03/06/23  Chesley Noon, MD  timolol (TIMOPTIC) 0.5 % ophthalmic solution Place 1 drop into both eyes daily. 02/24/23   [provider]    Physical Exam: Vitals:   08/24/23 1209 08/24/23 1235 08/24/23 1430 08/24/23 1445  BP:  (!) 92/46 (!) 138/54 126/61  Pulse:  67 73 71  Resp:  20 (!) 26 16  Temp:      TempSrc:      SpO2:  95% 93% 94%  Weight: 59 kg     Height: 5\' 3"  (1.6 m)      Physical Exam Vitals and nursing note reviewed.  HENT:     Head: Normocephalic and atraumatic.     Mouth/Throat:     Mouth: Mucous membranes are dry.     Pharynx: Oropharynx is clear.  Cardiovascular:     Rate and Rhythm: Normal rate and regular rhythm.     Heart sounds: No murmur heard. Pulmonary:     Effort: Pulmonary effort is normal. No respiratory distress.     Breath  sounds: Rhonchi present. No decreased breath sounds or wheezing.  Abdominal:     General: Bowel sounds are normal. There is no distension.     Palpations: Abdomen is soft.     Tenderness: There is no abdominal tenderness. There is no guarding.  Skin:    General: Skin is warm and dry.  Neurological:     Comments:  Patient  is alert and oriented to person and situation.  Seems to get confused over details.  Psychiatric:        Mood and Affect: Mood normal.        Behavior: Behavior normal.    Data Reviewed: CBC with WBC of 24.2, hemoglobin 11.1, platelets 170 CMP with sodium of 134, potassium 5.8, bicarb 20, anion gap 15, BUN 62, creatinine 2.03, AST 19, ALT 12, GFR 23 Lactic acid 2.1  EKG personally reviewed.  Sinus rhythm with rate of 67.  No ischemic appearing changes.  CT ABDOMEN PELVIS WO CONTRAST Result Date: 08/24/2023 CLINICAL DATA:  Sepsis.  Nausea vomiting and diarrhea. EXAM: CT ABDOMEN AND PELVIS WITHOUT CONTRAST TECHNIQUE: Multidetector CT imaging of the abdomen and pelvis was performed following the standard protocol without IV contrast. RADIATION DOSE REDUCTION: This exam was performed according to the departmental dose-optimization program which includes automated exposure control, adjustment of the mA and/or kV according to patient size and/or use of iterative reconstruction technique. COMPARISON:  CT abdomen pelvis dated 03/05/2023. FINDINGS: Evaluation of this exam is limited in the absence of intravenous contrast as well as due to respiratory motion. Lower chest: Right lower lobe reticular and patchy opacity consistent with developing infiltrate or aspiration. There is coronary vascular calcification. No intra-abdominal free air or free fluid. Hepatobiliary: The liver is unremarkable. No biliary dilatation. Layering stone in the gallbladder. No pericholecystic fluid or evidence of acute cholecystitis by CT. Pancreas: Unremarkable. No pancreatic ductal dilatation or surrounding  inflammatory changes. Spleen: Normal in size without focal abnormality. Adrenals/Urinary Tract: The adrenal glands are unremarkable. Mild bilateral renal parenchyma atrophy. There is no hydronephrosis or nephrolithiasis on either side. Small right renal upper pole hypodense lesion not characterized on this CT but similar to prior CT most consistent with a cyst. The visualized ureters and urinary bladder appear unremarkable. Stomach/Bowel: There is sigmoid diverticulosis. There is no bowel obstruction or active inflammation. Appendectomy. Vascular/Lymphatic: Advanced aortoiliac atherosclerotic disease. The IVC is unremarkable. No portal venous gas. There is no adenopathy. Reproductive: Hysterectomy.  No suspicious adnexal masses. Other: None Musculoskeletal: Osteopenia with degenerative changes of the spine. Progression of T11 compression fracture since the prior CT with near complete loss of vertebral body height centrally. Correlation with clinical exam and point tenderness recommended. Right femoral neck screw. IMPRESSION: 1. No acute intra-abdominal or pelvic pathology. 2. Sigmoid diverticulosis. No bowel obstruction. 3. Cholelithiasis. 4. Right lower lobe developing infiltrate or aspiration. 5. Progression of T11 compression fracture since the prior CT. Correlation with clinical exam and point tenderness recommended. 6.  Aortic Atherosclerosis (ICD10-I70.0). Electronically Signed   By: Elgie Collard M.D.   On: 08/24/2023 14:12   DG Chest 2 View Result Date: 08/24/2023 CLINICAL DATA:  Sepsis suspected. EXAM: CHEST - 2 VIEW COMPARISON:  Chest radiograph dated 03/05/2023. FINDINGS: No focal consolidation, pleural effusion, pneumothorax. The cardiac silhouette is within normal limits. Atherosclerotic calcification of the aorta. Osteopenia with degenerative changes of the spine. Chronic appearing lower thoracic compression fracture with anterior wedging appears progressed since the prior radiograph. Correlation  with clinical exam and point tenderness recommended. IMPRESSION: 1. No active cardiopulmonary disease. 2. Progression of lower thoracic compression fracture. Electronically Signed   By: Elgie Collard M.D.   On: 08/24/2023 13:22   Results are pending, will review when available.  Assessment and Plan:  * Acute gastroenteritis Patient presenting with 3 days of nausea, vomiting and diarrhea; likely viral gastroenteritis, however given prior history of C. difficile colitis, will check both panels.  -  GI and C. difficile panel pending - Zofran as needed for nausea - IV fluids as ordered  Hypotension Hypovolemic hypotension in the setting of severe vomiting and diarrhea.  Blood pressure has improved with IV fluid resuscitation.  - Continue maintenance fluids - Hold home antihypertensives  Acute kidney injury superimposed on chronic kidney disease (HCC) In the setting of GI illness.  - S/p 2 L bolus - Continue maintenance fluids - Hold home nephrotoxic agents - Recheck BMP in the a.m. - Strict in and out  Cough Likely acute bronchitis in the setting of underlying viral illness.  Chest x-ray with no evidence of pneumonia.  - Supportive management - Respiratory viral panel pending  (HFpEF) heart failure with preserved ejection fraction (HCC) Hypovolemic on examination.  - Daily weights  Dementia with agitation (HCC) Patient appears at baseline at this time.  - Continue home regimen - Delirium precautions  Essential hypertension - Hold home antihypertensives in the setting of hypotension and AKI  Hyperkalemia History of chronic and recurrent hyperkalemia in the setting of ARB use.  Slightly worsened at this time in the setting of AKI.  No EKG changes noted  - Recheck in the a.m.  Paroxysmal atrial fibrillation (HCC) Currently in sinus rhythm. No longer on anticoagulation since 2013 per chart review.   Coronary artery disease - Continue home aspirin and  statin.  Advance Care Planning:   Code Status: Limited: Do not attempt resuscitation (DNR) -DNR-LIMITED -Do Not Intubate/DNI patient states that if she were to suffer a cardiac or respiratory arrest, she would not want resuscitation.  She notes that when it is her time to go, she is ready to do so.  Her daughter is present for discussion at bedside.  She states that she has not spoken to her mother regarding these wishes in the past, but would like to respect her wishes at this time.  Although patient has history of dementia with some difficulty with details/memory, she expresses her wishes consistently and persistently  Consults: None  Family Communication: Daughter updated at bedside  Severity of Illness: The appropriate patient status for this patient is OBSERVATION. Observation status is judged to be reasonable and necessary in order to provide the required intensity of service to ensure the patient's safety. The patient's presenting symptoms, physical exam findings, and initial radiographic and laboratory data in the context of their medical condition is felt to place them at decreased risk for further clinical deterioration. Furthermore, it is anticipated that the patient will be medically stable for discharge from the hospital within 2 midnights of admission.   Author: Verdene Lennert, MD 08/24/2023 4:43 PM  For on call review [email or web address].

## 2023-08-24 NOTE — Assessment & Plan Note (Signed)
Patient presenting with 3 days of nausea, vomiting and diarrhea; likely viral gastroenteritis, however given prior history of C. difficile colitis, will check both panels.  - GI and C. difficile panel pending - Zofran as needed for nausea - IV fluids as ordered

## 2023-08-24 NOTE — Assessment & Plan Note (Signed)
Hypovolemic on examination.  - Daily weights

## 2023-08-24 NOTE — ED Provider Triage Note (Signed)
Emergency Medicine Provider Triage Evaluation Note  Audrey Duran , a 89 y.o. female  was evaluated in triage.  Pt complains of n/v/d x 3 days.  Patient was sent over by [facility] for evaluation in the ED for hypotension.  Patient reports she is feeling generalized weakness and "swimmy headed".  Denies recent illnesses, fever and abdominal pain.  Review of Systems  Positive:  Negative:   Physical Exam  BP (!) 91/36   Pulse 71   Temp 97.8 F (36.6 C) (Oral)   Resp 20   SpO2 93%  Gen:   Awake, no distress   Resp:  Normal effort LCTAB MSK:   Moves extremities without difficulty  Other:  Abdomen - soft, nontender  Medical Decision Making  Medically screening exam initiated at 12:05 PM.  Appropriate orders placed.  Audrey Duran was informed that the remainder of the evaluation will be completed by another provider, this initial triage assessment does not replace that evaluation, and the importance of remaining in the ED until their evaluation is complete.    Romeo Apple, Venita Seng A, PA-C 08/24/23 1208

## 2023-08-24 NOTE — ED Provider Notes (Signed)
[name] Provider Note    Event Date/Time   First MD Initiated Contact with Patient 08/24/23 1221     (approximate)   History   Weakness, Vomiting, and Diarrhea   HPI  Audrey Duran is a 89 y.o. female with history of ischemic cardiomyopathy, paroxysmal atrial fibrillation, COPD, CKD presenting with nausea vomiting and diarrhea.  Patient was sent here from her doctor's office because she was found to be hypotensive to the 70s.  Patient states that she has been having nausea vomiting diarrhea for the last 3 days.  She denies any abdominal pain.  No chest pain, urinary symptoms, shortness of breath, fever.  States that her last bowel movement was this morning at the clinic.  Per daughter patient has no recent antibiotic use, has had decreased p.o. intake since the nausea vomiting started.  Has been very weak.  Independent history obtained from daughter as above.  Independent review of chart, she was seen at her clinic today, they had to stop with the interview today because she had to go to the bathroom with diarrhea.  Has not taken any food today.  Appeared dehydrated and fatigued for them.     Physical Exam   Triage Vital Signs: ED Triage Vitals  Encounter Vitals Group     BP 08/24/23 1200 (!) 91/36     Systolic BP Percentile --      Diastolic BP Percentile --      Pulse Rate 08/24/23 1200 71     Resp 08/24/23 1200 20     Temp 08/24/23 1200 97.8 F (36.6 C)     Temp Source 08/24/23 1200 Oral     SpO2 08/24/23 1200 93 %     Weight 08/24/23 1209 130 lb (59 kg)     Height 08/24/23 1209 5\' 3"  (1.6 m)     Head Circumference --      Peak Flow --      Pain Score --      Pain Loc --      Pain Education --      Exclude from Growth Chart --     Most recent vital signs: Vitals:   08/24/23 1206 08/24/23 1235  BP: (!) 76/57 (!) 92/46  Pulse:  67  Resp:  20  Temp:    SpO2:  95%     General: Awake, no distress.  CV:  Good peripheral  perfusion.  Resp:  Normal effort.  Clear bilaterally Abd:  No distention.  Soft nontender to deep palpation Other:  Dry mucous membranes   ED Results / Procedures / Treatments   Labs (all labs ordered are listed, but only abnormal results are displayed) Labs Reviewed  COMPREHENSIVE METABOLIC PANEL - Abnormal; Notable for the following components:      Result Value   Sodium 134 (*)    Potassium 5.8 (*)    CO2 20 (*)    Glucose, Bld 197 (*)    BUN 62 (*)    Creatinine, Ser 2.03 (*)    Albumin 3.4 (*)    GFR, Estimated 23 (*)    All other components within normal limits  LACTIC ACID, PLASMA - Abnormal; Notable for the following components:   Lactic Acid, Venous 2.1 (*)    All other components within normal limits  CBC WITH DIFFERENTIAL/PLATELET - Abnormal; Notable for the following components:   WBC 24.2 (*)    RBC 3.62 (*)    Hemoglobin 11.1 (*)  HCT 34.3 (*)    Neutro Abs 21.3 (*)    All other components within normal limits  CULTURE, BLOOD (ROUTINE X 2)  CULTURE, BLOOD (ROUTINE X 2)  C DIFFICILE QUICK SCREEN W PCR REFLEX    GASTROINTESTINAL PANEL BY PCR, STOOL (REPLACES STOOL CULTURE)  SARS CORONAVIRUS 2 BY RT PCR  PROTIME-INR  LIPASE, BLOOD  LACTIC ACID, PLASMA  URINALYSIS, W/ REFLEX TO CULTURE (INFECTION SUSPECTED)     EKG  Sinus rhythm, rate 67, normal QRS, normal QTc, baseline is wandering but no peaked T waves, no widened QRS, no ischemic ST elevations, T wave flattening in aVL, may have a change compared to prior   RADIOLOGY Chest x-ray on my interpretation without focal consolidation   PROCEDURES:  Critical Care performed: Yes, see critical care procedure note(s)  Procedures   MEDICATIONS ORDERED IN ED: Medications  sodium chloride 0.9 % bolus 1,000 mL (0 mLs Intravenous Stopped 08/24/23 1410)  ondansetron (ZOFRAN) injection 4 mg (4 mg Intravenous Given 08/24/23 1222)  lactated ringers bolus 1,000 mL (1,000 mLs Intravenous New Bag/Given 08/24/23  1409)     IMPRESSION / MDM / ASSESSMENT AND PLAN / ED COURSE  I reviewed the triage vital signs and the nursing notes.                              Differential diagnosis includes, but is not limited to, dehydration, electrolyte derangements, gastroenteritis, norovirus, also consider sepsis, consider C. difficile patient has no recent antibiotic use, suspect that this might be viral given that norovirus is currently prevalent in the community, will hold off antibiotics for now and fluid resuscitate her.    Repeat blood pressures here are systolic 100s.  Patient states that she is feeling little better with the hydration, nausea improved with the Zofran.  Independent review of labs, white count is 24 with a mild lactate bump.  Will get a CT abdomen pelvis to further evaluate.  Her creatinine is also elevated compared to prior, potassium is mildly elevated, she tends to be around 5-5.5 when looking at prior labs.  Will continue the fluids, reassess.  Likely need admission.  CT imaging without acute pathology.  Given patient's hypotension initially, responding to fluids, AKI, she will need to be admitted for further management.  Consulted hospitalist was agreeable plan for admission will evaluate the patient.  She is admitted.  Patient's presentation is most consistent with acute presentation with potential threat to life or bodily function.        FINAL CLINICAL IMPRESSION(S) / ED DIAGNOSES   Final diagnoses:  Nausea vomiting and diarrhea  Hypotension, unspecified hypotension type  AKI (acute kidney injury) (HCC)  Hyperkalemia     Rx / DC Orders   ED Discharge Orders     None        Note:  This document was prepared using Dragon voice recognition software and may include unintentional dictation errors.    Claybon Jabs, MD 08/24/23 [phone]

## 2023-08-24 NOTE — ED Notes (Signed)
Pt oxygen sats maintain 86-87%. Pt placed on 2L [place]. Pt denies SOB. MD made aware.

## 2023-08-24 NOTE — Assessment & Plan Note (Signed)
History of chronic and recurrent hyperkalemia in the setting of ARB use.  Slightly worsened at this time in the setting of AKI.  No EKG changes noted  - Recheck in the a.m.

## 2023-08-25 ENCOUNTER — Observation Stay: Payer: PPO

## 2023-08-25 ENCOUNTER — Inpatient Hospital Stay
Admit: 2023-08-25 | Discharge: 2023-08-25 | Disposition: A | Discharge status: Home / Self Care | Payer: PPO | Attending: Pulmonary Disease | Admitting: Pulmonary Disease

## 2023-08-25 DIAGNOSIS — E1122 Type 2 diabetes mellitus with diabetic chronic kidney disease: Secondary | ICD-10-CM | POA: Diagnosis not present

## 2023-08-25 DIAGNOSIS — R571 Hypovolemic shock: Secondary | ICD-10-CM | POA: Diagnosis not present

## 2023-08-25 DIAGNOSIS — D631 Anemia in chronic kidney disease: Secondary | ICD-10-CM | POA: Diagnosis not present

## 2023-08-25 DIAGNOSIS — N1831 Chronic kidney disease, stage 3a: Secondary | ICD-10-CM | POA: Diagnosis not present

## 2023-08-25 DIAGNOSIS — I5033 Acute on chronic diastolic (congestive) heart failure: Secondary | ICD-10-CM | POA: Diagnosis not present

## 2023-08-25 DIAGNOSIS — I9589 Other hypotension: Secondary | ICD-10-CM | POA: Diagnosis not present

## 2023-08-25 DIAGNOSIS — J9601 Acute respiratory failure with hypoxia: Secondary | ICD-10-CM | POA: Diagnosis present

## 2023-08-25 DIAGNOSIS — F03A3 Unspecified dementia, mild, with mood disturbance: Secondary | ICD-10-CM | POA: Diagnosis not present

## 2023-08-25 DIAGNOSIS — I700 Atherosclerosis of aorta: Secondary | ICD-10-CM | POA: Diagnosis not present

## 2023-08-25 DIAGNOSIS — Z1152 Encounter for screening for COVID-19: Secondary | ICD-10-CM | POA: Diagnosis not present

## 2023-08-25 DIAGNOSIS — G9341 Metabolic encephalopathy: Secondary | ICD-10-CM

## 2023-08-25 DIAGNOSIS — J440 Chronic obstructive pulmonary disease with acute lower respiratory infection: Secondary | ICD-10-CM | POA: Diagnosis not present

## 2023-08-25 DIAGNOSIS — E860 Dehydration: Secondary | ICD-10-CM | POA: Diagnosis not present

## 2023-08-25 DIAGNOSIS — M4854XA Collapsed vertebra, not elsewhere classified, thoracic region, initial encounter for fracture: Secondary | ICD-10-CM | POA: Diagnosis not present

## 2023-08-25 DIAGNOSIS — E785 Hyperlipidemia, unspecified: Secondary | ICD-10-CM | POA: Diagnosis not present

## 2023-08-25 DIAGNOSIS — R918 Other nonspecific abnormal finding of lung field: Secondary | ICD-10-CM | POA: Diagnosis not present

## 2023-08-25 DIAGNOSIS — F32A Depression, unspecified: Secondary | ICD-10-CM | POA: Diagnosis not present

## 2023-08-25 DIAGNOSIS — K529 Noninfective gastroenteritis and colitis, unspecified: Secondary | ICD-10-CM | POA: Diagnosis not present

## 2023-08-25 DIAGNOSIS — J690 Pneumonitis due to inhalation of food and vomit: Secondary | ICD-10-CM | POA: Diagnosis not present

## 2023-08-25 DIAGNOSIS — J9811 Atelectasis: Secondary | ICD-10-CM | POA: Diagnosis not present

## 2023-08-25 DIAGNOSIS — Z66 Do not resuscitate: Secondary | ICD-10-CM | POA: Diagnosis not present

## 2023-08-25 DIAGNOSIS — F03A11 Unspecified dementia, mild, with agitation: Secondary | ICD-10-CM | POA: Diagnosis not present

## 2023-08-25 DIAGNOSIS — N179 Acute kidney failure, unspecified: Secondary | ICD-10-CM | POA: Diagnosis not present

## 2023-08-25 DIAGNOSIS — N189 Chronic kidney disease, unspecified: Secondary | ICD-10-CM | POA: Diagnosis not present

## 2023-08-25 DIAGNOSIS — R0602 Shortness of breath: Secondary | ICD-10-CM | POA: Diagnosis not present

## 2023-08-25 DIAGNOSIS — I130 Hypertensive heart and chronic kidney disease with heart failure and stage 1 through stage 4 chronic kidney disease, or unspecified chronic kidney disease: Secondary | ICD-10-CM | POA: Diagnosis not present

## 2023-08-25 DIAGNOSIS — I480 Paroxysmal atrial fibrillation: Secondary | ICD-10-CM | POA: Diagnosis not present

## 2023-08-25 LAB — RESPIRATORY PANEL BY PCR

## 2023-08-25 LAB — CBC
HCT: 28.9 % — ABNORMAL LOW (ref 36.0–46.0)
Hemoglobin: 9.4 g/dL — ABNORMAL LOW (ref 12.0–15.0)
MCH: 30.8 pg (ref 26.0–34.0)
MCHC: 32.5 g/dL (ref 30.0–36.0)
MCV: 94.8 fL (ref 80.0–100.0)
Platelets: 125 10*3/uL — ABNORMAL LOW (ref 150–400)
RBC: 3.05 MIL/uL — ABNORMAL LOW (ref 3.87–5.11)
RDW: 13.1 % (ref 11.5–15.5)
WBC: 16.1 10*3/uL — ABNORMAL HIGH (ref 4.0–10.5)
nRBC: 0 % (ref 0.0–0.2)

## 2023-08-25 LAB — BLOOD GAS, ARTERIAL
Acid-base deficit: 3.5 mmol/L — ABNORMAL HIGH (ref 0.0–2.0)
Bicarbonate: 22.1 mmol/L (ref 20.0–28.0)
FIO2: 0.36 %
O2 Saturation: 92.4 %
Patient temperature: 37
pCO2 arterial: 41 mm[Hg] (ref 32–48)
pH, Arterial: 7.34 — ABNORMAL LOW (ref 7.35–7.45)
pO2, Arterial: 61 mm[Hg] — ABNORMAL LOW (ref 83–108)

## 2023-08-25 LAB — BASIC METABOLIC PANEL
Anion gap: 9 (ref 5–15)
BUN: 43 mg/dL — ABNORMAL HIGH (ref 8–23)
CO2: 21 mmol/L — ABNORMAL LOW (ref 22–32)
Calcium: 9.2 mg/dL (ref 8.9–10.3)
Chloride: 108 mmol/L (ref 98–111)
Creatinine, Ser: 1.67 mg/dL — ABNORMAL HIGH (ref 0.44–1.00)
GFR, Estimated: 30 mL/min — ABNORMAL LOW (ref >60)
Glucose, Bld: 132 mg/dL — ABNORMAL HIGH (ref 70–99)
Potassium: 4.8 mmol/L (ref 3.5–5.1)
Sodium: 138 mmol/L (ref 135–145)

## 2023-08-25 LAB — CBG MONITORING, ED
Glucose-Capillary: 148 mg/dL — ABNORMAL HIGH (ref 70–99)
Glucose-Capillary: 156 mg/dL — ABNORMAL HIGH (ref 70–99)
Glucose-Capillary: 123 mg/dL — ABNORMAL HIGH (ref 70–99)

## 2023-08-25 LAB — BRAIN NATRIURETIC PEPTIDE: B Natriuretic Peptide: 1008 pg/mL — ABNORMAL HIGH (ref 0.0–100.0)

## 2023-08-25 LAB — PHOSPHORUS: Phosphorus: 2.2 mg/dL — ABNORMAL LOW (ref 2.5–4.6)

## 2023-08-25 LAB — MAGNESIUM: Magnesium: 1.5 mg/dL — ABNORMAL LOW (ref 1.7–2.4)

## 2023-08-25 MED ORDER — MAGNESIUM SULFATE 4 GM/100ML IV SOLN
4.0000 g | Freq: Once | INTRAVENOUS | Status: AC
  Administered 2023-08-25: 4 g via INTRAVENOUS
  Filled 2023-08-25: qty 100

## 2023-08-25 MED ORDER — HYDROCOD POLI-CHLORPHE POLI ER 10-8 MG/5ML PO SUER: 5.0000 mL | Freq: Two times a day (BID) | ORAL | Status: DC | PRN

## 2023-08-25 MED ORDER — INSULIN ASPART 100 UNIT/ML IJ SOLN
0.0000 [IU] | Freq: Three times a day (TID) | INTRAMUSCULAR | Status: DC
  Administered 2023-08-25: 2 [IU] via SUBCUTANEOUS
  Administered 2023-08-25 – 2023-08-27 (×3): 1 [IU] via SUBCUTANEOUS
  Administered 2023-08-28: 2 [IU] via SUBCUTANEOUS
  Administered 2023-08-28 – 2023-09-02 (×9): 1 [IU] via SUBCUTANEOUS
  Filled 2023-08-25 (×18): qty 1

## 2023-08-25 MED ORDER — FUROSEMIDE 10 MG/ML IJ SOLN
20.0000 mg | Freq: Once | INTRAMUSCULAR | Status: AC
  Administered 2023-08-25: 20 mg via INTRAVENOUS
  Filled 2023-08-25: qty 4

## 2023-08-25 MED ORDER — POTASSIUM PHOSPHATES 15 MMOLE/5ML IV SOLN
15.0000 mmol | Freq: Once | INTRAVENOUS | Status: AC
  Administered 2023-08-25: 15 mmol via INTRAVENOUS
  Filled 2023-08-25: qty 5

## 2023-08-25 MED ORDER — SODIUM CHLORIDE 0.9 % IV SOLN
1.0000 g | INTRAVENOUS | Status: DC
  Administered 2023-08-25 – 2023-08-27 (×3): 1 g via INTRAVENOUS
  Filled 2023-08-25 (×3): qty 10

## 2023-08-25 MED ORDER — METRONIDAZOLE 500 MG/100ML IV SOLN
500.0000 mg | Freq: Two times a day (BID) | INTRAVENOUS | Status: DC
  Administered 2023-08-25 – 2023-08-28 (×7): 500 mg via INTRAVENOUS
  Filled 2023-08-25 (×7): qty 100

## 2023-08-25 MED ORDER — IPRATROPIUM-ALBUTEROL 0.5-2.5 (3) MG/3ML IN SOLN
3.0000 mL | Freq: Four times a day (QID) | RESPIRATORY_TRACT | Status: DC
  Administered 2023-08-25 – 2023-08-27 (×10): 3 mL via RESPIRATORY_TRACT
  Filled 2023-08-25 (×10): qty 3

## 2023-08-25 NOTE — Progress Notes (Signed)
OT Cancellation Note  Patient Details Name: KALIYA SHREINER MRN: [id number] DOB: [birth year]   Cancelled Treatment:    Reason Eval/Treat Not Completed: Patient not medically ready. Orders received, chart reviewed. Pt currently with a red MEWS score, BP 173/190, RR 44 and HR 114. OT will hold eval at this time and perform eval when medically appropriate.  Makenzi Bannister L. Mario Coronado, OTR/L  08/25/23, 11:53 AM

## 2023-08-25 NOTE — ED Notes (Signed)
Patient is resting comfortably. States no needs at this time

## 2023-08-25 NOTE — ED Notes (Signed)
Pt has removed [place] several times, I have replaced it those times. Pt states it was r/t the [place] hurting her ears. I loosened the tubing and placed a piece of gauze on each ear below the tubing

## 2023-08-25 NOTE — Progress Notes (Signed)
SLP Cancellation Note  Patient Details Name: Audrey Duran MRN: [id number] DOB: 12-21-1934   Cancelled treatment:       Reason Eval/Treat Not Completed: Medical issues which prohibited therapy;Patient not medically ready (chart reviewed)  Noted pt w/ Red MEWS score. Will hold on BSE at this time and assess when medically appropriate. Recommend strict aspiration precautions IF given any po intake.     Jerilynn Som, MS, CCC-SLP Speech Language Pathologist Rehab Services; [facility] [phone] (ascom) Wilna Pennie 08/25/2023, 2:34 PM

## 2023-08-25 NOTE — Consult Note (Addendum)
NAME:  Audrey Duran, MRN:  [id number], DOB:  February 05, 1935, LOS: 0 ADMISSION DATE:  08/24/2023, CONSULTATION DATE: 08/25/2023 REFERRING MD: Gillis Santa, MD, CHIEF COMPLAINT: Acute respiratory failure, possible aspiration pneumonia  History of Present Illness:  Patient is an 89 year old female, remote former smoker, with complex medical history as noted below to include dementia, coronary artery disease, CKD IIIa and cardiomyopathy who presented to [facility] ED on 24 August 2023 due to nausea, vomiting and generalized weakness.  History is obtained from the records and from the patient's bedside nurse.  No family is present during the evaluation and the patient is lethargic and on noninvasive ventilation.  As stated, the patient was transported from her primary care practitioner's office yesterday.  She was noted to be hypotensive and appeared volume depleted when evaluated at her primary physician's office (Dr. Wallene Huh).  She presented there stating that she had had vomiting over the past several days, she also exhibited vomiting during the visit at the primary care physician's office.  She did have 1 episode of near fecal incontinence in the clinic.  She was evaluated by the hospitalist team in the ED the patient was noted to be hypotensive and apparently volume depleted.  She was admitted with a working diagnosis of acute gastroenteritis, hypotension, acute kidney injury superimposed on chronic kidney disease and cough.  Currently the patient cannot endorse any symptomatology due to her lethargy and need for noninvasive ventilation.  This morning the patient was noted to have persistent oxygen requirements at 4 L/min nasal cannula O2 but she developed significant tachypnea with respiratory rate of 44.  She was placed on BiPAP.  There is suspicion of aspiration, chest x-ray obtained this morning shows mild streaky basilar opacities.  Pulmonary has been consulted to assist with management.  The patient is  DNR.  The patient resides at the [facility].  I contacted the patient's daughter and updated her on the above issues.  She did not have much to add with regards to history.  Pertinent  Medical History  Dementia with prior episodes of delirium CKD IIIa Ischemic cardiomyopathy HFpEF 50% (echo 2017) ?  COPD (no FEV1 on record) Paroxysmal atrial fibrillation  Significant Hospital Events: Including procedures, antibiotic start and stop dates in addition to other pertinent events   01/28 admitted with working diagnosis of gastroenteritis, hypotension and volume depletion 01/29 developed respiratory distress, required BiPAP, high risk for aspiration, Pulmonary Medicine consulted, BNP elevated  Interim History / Subjective:  Patient is currently with altered mental status not very interactive.  On BiPAP.  Received Lasix IV x 1  Objective   Blood pressure (!) 173/160, pulse (!) 114, temperature 99.6 F (37.6 C), temperature source Axillary, resp. rate (!) 34, height 5\' 3"  (1.6 m), weight 59 kg, SpO2 96%.     Intake/Output Summary (Last 24 hours) at 08/25/2023 1405 Last data filed at 08/25/2023 2952 Gross per 24 hour  Intake 1066.67 ml  Output --  Net 1066.67 ml   Filed Weights   08/24/23 1209  Weight: 59 kg   SpO2: 93 % O2 Flow Rate (L/min): 45 L/min FiO2 (%): 42 %   Examination: GENERAL: Debilitated, chronically ill-appearing woman, lying in stretcher, very poorly responsive on BiPAP. HEAD: Normocephalic, atraumatic.  EYES: Pupils equal, round, reactive to light.  No scleral icterus.  MOUTH: Edentulous, oral mucosa moist. NECK: Supple. No thyromegaly. Trachea midline. No JVD.  No adenopathy. PULMONARY: Good air entry bilaterally.  Diffuse rhonchi throughout, no wheezes.  CARDIOVASCULAR: S1 and S2.  Tachycardic regular rhythm.  Frequent extrasystoles, cannot appreciate murmurs due to chest sounds. ABDOMEN: Nondistended, diminished bowel sounds but present,  soft, could not elicit tenderness MUSCULOSKELETAL: No joint deformity, no clubbing, no edema.  NEUROLOGIC: Patient lethargic/obtunded, cannot assess further. SKIN: Intact,warm,dry. PSYCH: Cannot assess due to altered mental status.  Reviewed imaging  Chest x-ray performed today shows cardiomegaly, bibasilar faint infiltrates versus atelectasis:   Assessment & Plan:  Acute respiratory failure with hypoxia Ddx: Aspiration event versus decompensation of heart failure (BNP elevated) after volume repletion Patient high risk for aspiration due to altered mental status recent history of vomiting Recommend switching to high flow nasal cannula O2, discontinue BiPAP (no CO2 retention on ABG) Patient cannot protect airway currently Titrate O2 for oxygen saturations at 90% or better Continue empiric antibiotics for now Patient received 1 dose of Lasix Recommend assessing cardiomyopathy with 2D echo Continue nebulization treatments for mucociliary clearance Trend chest x-ray, BNP  Hypovolemic shock due to severe nausea and vomiting Patient was aggressively volume resuscitated Currently hypertensive Appears to be now volume overloaded after initial volume resuscitation KVO fluids Received Lasix x 1 Ordered echocardiogram  Acute kidney injury superimposed on CKD IIIa Improving Avoid nephrotoxic agents Continue to monitor renal function and electrolytes Supplement electrolytes as needed  Acute encephalopathy due to metabolic derangements/acute illness Supportive care BiPAP not best choice due to AMS and high risk for aspiration Continue to monitor Consider head CT if does not respond to correction of metabolic derangements  Best Practice (right click and "Reselect all SmartList Selections" daily)   Diet/type: NPO DVT prophylaxis LMWH Pressure ulcer(s): pressure ulcer assessment deferred  GI prophylaxis: N/A Lines: N/A Foley:  Yes, and it is still needed Code Status:  DNR Last date  of multidisciplinary goals of care discussion [08/25/2023, via phone conversation with patient's daughter Renee]  Labs   CBC: Recent Labs  Lab 08/24/23 1210 08/25/23 0345  WBC 24.2* 16.1*  NEUTROABS 21.3*  --   HGB 11.1* 9.4*  HCT 34.3* 28.9*  MCV 94.8 94.8  PLT 170 125*    Basic Metabolic Panel: Recent Labs  Lab 08/24/23 1210 08/25/23 0345  NA 134* 138  K 5.8* 4.8  CL 99 108  CO2 20* 21*  GLUCOSE 197* 132*  BUN 62* 43*  CREATININE 2.03* 1.67*  CALCIUM 10.0 9.2  MG  --  1.5*  PHOS  --  2.2*   GFR: Estimated Creatinine Clearance: 20 mL/min (A) (by C-G formula based on SCr of 1.67 mg/dL (H)). Recent Labs  Lab 08/24/23 1210 08/24/23 1517 08/25/23 0345  WBC 24.2*  --  16.1*  LATICACIDVEN 2.1* 1.6  --     Liver Function Tests: Recent Labs  Lab 08/24/23 1210  AST 18  ALT 12  ALKPHOS 49  BILITOT 0.7  PROT 6.8  ALBUMIN 3.4*   Recent Labs  Lab 08/24/23 1210  LIPASE 29   BNP (last 3 results) Recent Labs    08/25/23 0345  BNP 1,008.0*   ABG    Component Value Date/Time   PHART 7.34 (L) 08/25/2023 0852   PCO2ART 41 08/25/2023 0852   PO2ART 61 (L) 08/25/2023 0852   HCO3 22.1 08/25/2023 0852   ACIDBASEDEF 3.5 (H) 08/25/2023 0852   O2SAT 92.4 08/25/2023 0852     Coagulation Profile: Recent Labs  Lab 08/24/23 1210  INR 1.1    Cardiac Enzymes: No results for input(s): "CKTOTAL", "CKMB", "CKMBINDEX", "TROPONINI" in the last 168 hours.  HbA1C: Hemoglobin A1C  Date/Time Value Ref Range Status  02/17/2012 05:04 AM 5.7 4.2 - 6.3 % Final    Comment:    The American Diabetes Association recommends that a primary goal of therapy should be <7% and that physicians should reevaluate the treatment regimen in patients with HbA1c values consistently >8%.    Hgb A1c MFr Bld  Date/Time Value Ref Range Status  10/02/2022 11:23 AM 5.5 4.6 - 6.5 % Final    Comment:    Glycemic Control Guidelines for People with Diabetes:Non Diabetic:  <6%Goal of  Therapy: <7%Additional Action Suggested:  >8%   11/24/2021 09:45 AM 5.4 4.6 - 6.5 % Final    Comment:    Glycemic Control Guidelines for People with Diabetes:Non Diabetic:  <6%Goal of Therapy: <7%Additional Action Suggested:  >8%     CBG: Recent Labs  Lab 08/25/23 1154  GLUCAP 148*    Review of Systems:   Unable to obtain full review of systems due to the patient's altered mental status and acuity of the situation.  Past Medical History:  She,  has a past medical history of Anemia, Atrial fibrillation (HCC), COPD (chronic obstructive pulmonary disease) (HCC), Coronary artery disease (1995), Hyperlipidemia, Ischemic cardiomyopathy (Dec 2011), Myocardial infarction [facility]), and Recurrent HSV (herpes simplex virus).   Surgical History:   Past Surgical History:  Procedure Laterality Date   ABDOMINAL HYSTERECTOMY  june 2014   BREAST EXCISIONAL BIOPSY Left 1990's   neg   CAROTID ENDARTERECTOMY  July 2007   Dew, right carotid   CATARACT EXTRACTION  2004, 2006   JOINT REPLACEMENT  July 2013   total hip , [place]   LAPAROSCOPIC APPENDECTOMY N/A 02/20/2020   Procedure: APPENDECTOMY LAPAROSCOPIC;  Surgeon: Leafy Ro, MD;  Location: [facility] ORS;  Service: General;  Laterality: N/A;     Social History:   reports that she quit smoking about 31 years ago. Her smoking use included cigarettes. She has never used smokeless tobacco. She reports that she does not drink alcohol and does not use drugs.   Family History:  Her family history includes Breast cancer (age of onset: 55) in her sister; Cancer (age of onset: 53) in her sister; Cancer (age of onset: 14) in her mother; Heart disease in her father; Multiple sclerosis in her daughter.   Allergies Allergies  Allergen Reactions   Azithromycin     GI upset   Clarithromycin     ? Rash   Diclofenac     ? Rash   Lisinopril Other (See Comments)    hyperkalemia   Meloxicam     ? Rash   Nitrofurantoin Monohyd Macro     ? rash   Zocor  [Simvastatin]    Penicillins Rash     Home Medications  Prior to Admission medications   Medication Sig Start Date End Date Taking? Authorizing Provider  acetaminophen (TYLENOL) 325 MG tablet Take by mouth every 4 (four) hours as needed.   Yes [provider]  acyclovir (ZOVIRAX) 400 MG tablet Take 400 mg by mouth daily. 02/09/23  Yes [provider]  alendronate (FOSAMAX) 70 MG tablet Take 1 tablet by mouth every 7 days on an empty stomach with a full glass of water 04/27/22  Yes Sherlene Shams, MD  alum & mag hydroxide-simeth (MAALOX/MYLANTA) 200-200-20 MG/5ML suspension Take 30 mLs by mouth every 6 (six) hours as needed for indigestion or heartburn.   Yes [provider]  aspirin 81 MG tablet Take 81 mg by mouth at bedtime.  Yes [provider]  Cholecalciferol (VITAMIN D3) 75 MCG (3000 UT) TABS Take 1 tablet by mouth daily. Take one by mouth daily 04/27/22  Yes Sherlene Shams, MD  citalopram (CELEXA) 20 MG tablet Take 1 tablet (20 mg total) by mouth daily. 01/11/23  Yes Sherlene Shams, MD  diphenhydrAMINE (BENADRYL) 25 mg capsule Take 25 mg by mouth every 6 (six) hours as needed.   Yes [provider]  furosemide (LASIX) 20 MG tablet Take 20 mg by mouth daily. 07/19/23  Yes [provider]  guaifenesin (ROBITUSSIN) 100 MG/5ML syrup Take 300 mg by mouth 4 (four) times daily as needed for cough.   Yes [provider]  lidocaine (XYLOCAINE) 5 % ointment Apply 1 Application topically as needed. 03/06/23  Yes Marrion Coy, MD  lisinopril (ZESTRIL) 5 MG tablet Take 5 mg by mouth daily.   Yes [provider]  loperamide (IMODIUM) 2 MG capsule Take 4 mg by mouth as needed for diarrhea or loose stools.   Yes [provider]  LORazepam (ATIVAN) 0.5 MG tablet Take 0.5 mg by mouth every 6 (six) hours as needed for anxiety. 05/28/23  Yes [provider]  magnesium hydroxide (MILK OF MAGNESIA) 400 MG/5ML suspension  Take 30 mLs by mouth daily as needed for mild constipation.   Yes [provider]  magnesium oxide (MAG-OX) 400 MG tablet Take 400 mg by mouth daily.   Yes [provider]  mirtazapine (REMERON) 7.5 MG tablet Take 1 tablet (7.5 mg total) by mouth at bedtime. 10/12/22 08/24/23 Yes Chesley Noon, MD  risperiDONE (RISPERDAL) 0.25 MG tablet Take 1 tablet (0.25 mg total) by mouth at bedtime. Patient taking differently: Take 0.25 mg by mouth 2 (two) times daily. 10/12/22 08/25/23 Yes Chesley Noon, MD  rosuvastatin (CRESTOR) 20 MG tablet Take 1 tablet (20 mg total) by mouth daily. Patient taking differently: Take 20 mg by mouth at bedtime. 04/27/22  Yes Sherlene Shams, MD  timolol (TIMOPTIC) 0.5 % ophthalmic solution Place 1 drop into both eyes 2 (two) times daily. 02/24/23  Yes [provider]  ALPRAZolam Prudy Feeler) 0.5 MG tablet Take 0.5 mg by mouth daily. Patient not taking: Reported on 08/25/2023 01/12/23   [provider]  cyanocobalamin 1000 MCG tablet Take one by mouth daily Patient not taking: Reported on 08/25/2023    [provider]  HYDROcodone-acetaminophen (NORCO/VICODIN) 5-325 MG tablet Take 1 tablet by mouth every 6 (six) hours as needed for moderate pain. Patient not taking: Reported on 08/25/2023 03/06/23   Marrion Coy, MD  telmisartan (MICARDIS) 20 MG tablet Take 1 tablet (20 mg total) by mouth daily. 10/12/22 03/06/23  Chesley Noon, MD    Scheduled Meds:  enoxaparin (LOVENOX) injection  30 mg Subcutaneous Q24H   furosemide  20 mg Intravenous Once   guaiFENesin  600 mg Oral BID   insulin aspart  0-9 Units Subcutaneous TID WC   ipratropium-albuterol  3 mL Nebulization Q6H   sodium chloride flush  3 mL Intravenous Q12H   Continuous Infusions:  cefTRIAXone (ROCEPHIN)  IV Stopped (08/25/23 0981)   magnesium sulfate bolus IVPB     metronidazole Stopped (08/25/23 1042)   potassium PHOSPHATE IVPB (in mmol)     PRN Meds:.acetaminophen **OR**  acetaminophen, chlorpheniramine-HYDROcodone, ondansetron **OR** ondansetron (ZOFRAN) IV  Level 5 consult    Discussed with Dr. Lucianne Muss via secure chat.  Discussed with patient's daughter via phone conversation.   I spent 90 minutes of dedicated to the care of  this patient on the date of this encounter to include pre-visit review of records, face-to-face time with the patient discussing conditions above, post visit ordering of testing, clinical documentation with the electronic health record, making appropriate referrals as documented, and communicating necessary findings to members of the patients care team.  C. Danice Goltz, MD Advanced Bronchoscopy PCCM [facility]    *This note was generated using voice recognition software/Dragon and/or AI transcription program.  Despite best efforts to proofread, errors can occur which can change the meaning. Any transcriptional errors that result from this process are unintentional and may not be fully corrected at the time of dictation.

## 2023-08-25 NOTE — ED Notes (Signed)
Pt ambulated to restroom and back, new pants, underwear and pad supplied. All bedding changed

## 2023-08-25 NOTE — ED Notes (Signed)
Lab called and agreed to draw blood cultures

## 2023-08-25 NOTE — ED Notes (Signed)
Respiratory at bedside.

## 2023-08-25 NOTE — ED Notes (Addendum)
Patient noted to have o2 saturation of 93 on 4L nasal cannula, and with 44 respirations a minute. MD Lucianne Muss made aware via secure chat. Respiratory called to the bedside

## 2023-08-25 NOTE — Progress Notes (Signed)
[facility] Progress Note  Patient: Audrey Duran    ZOX:[id number]  DOA: 08/24/2023     Date of Service: the patient was seen and examined on 08/25/2023  Chief Complaint  Patient presents with   Weakness   Vomiting   Diarrhea   Brief hospital course: DAVA RENSCH is a 89 y.o. female with medical history significant of dementia, CAD s/p PCI (1995), chronic systolic CHF, hypertension, hyperlipidemia, CKD stage IIIa, hyperkalemia 2/2 lisinopril, type 2 diabetes, COPD, who presents to the ED due to nausea/vomiting/diarrhea and weakness.  History obtained from both patient and her daughter at bedside. Mrs.Streng states she has been experiencing severe nausea and vomiting for 2-3 days now.  She notes that she has not eaten in 2 days but has been trying to drink some ginger ale.  She additionally endorses diarrhea, however her daughter states facility has not reported any diarrhea.  Today she went to her PCPs office and was noted to be very weak and hypotensive.  Due to this, she was sent to the ED.  Mrs. Mensch endorses a cough but is not sure when it started, her daughter states it was not present 4 days ago.  She denies any shortness of breath.   ED course: On arrival to the ED, patient was hypotensive at 91/36 with MAP of 49.  She was saturating at 93% on room air.  She was afebrile at 97.8.Initial workup notable for WBC of 24.2, hemoglobin 11.1, potassium 5.8, glucose 197, BUN 62, creatinine 2.03 with GFR of 23.  Lactic acid 2.1.  CT of the abdomen was obtained with no acute abnormalities.  Patient started on IV fluids and Zofran; [facility] contacted for admission  Assessment and Plan:  # Acute hypoxic respiratory failure most likely due to pneumonia, possible aspiration Continue supplemental O2 admission and gradually wean off Patient required BiPAP, continue to use as needed Monitor ABG as needed WBC and procalcitonin elevated Started empiric antibiotics ceftriaxone and  Flagyl Patient is allergic to penicillin Continue DuoNeb every 6 hourly,  Mucinex 600 mg twice daily Continue fall and aspiration precautions BNP 1008 elevated, Lasix 20 mg one-time dose given today CXR: Mild streaky bibasilar opacities could reflect atelectasis or infiltrate CT scan: Right lower lobe developing infiltrate or aspiration.  Follow pulmonary consult Check respiratory viral panel   # Hypomagnesemia, mag repleted. # Phosphatemia, Phos repleted. Monitor electrolytes and replete as needed.  # Hyperkalemia resolved  # AKI due to dehydration, prerenal Creatinine is improving Monitor renal functions and urine output Avoid nephrotoxic medications, use renally dose medications  # Acute gastroenteritis Currently stable, no diarrhea.  Send stool sample if diarrhea reoccurs Watch for any dehydration Continue symptomatic treatment  # Hypotension, due to dehydration, hypovolemia Status post IV fluid given for hydration Monitor BP  # HFpEF Elevated BNP could be due to volume overload after bolus Lasix 20 mg IV one-time dose given Monitor BP Monitor volume status  # Paroxysmal atrial fibrillation (HCC) Currently in sinus rhythm. No longer on anticoagulation since 2013 per chart review.    # Coronary artery disease - Continue home aspirin and statin.  # Essential hypertension - Hold home antihypertensives in the setting of hypotension and AKI  # Anemia of chronic disease Monitor H&H  # T11 compression fracture CT scan: Progression of T11 compression fracture since the prior CT. Correlation with clinical exam and point tenderness recommended. Continue pain management Will consult to spine surgery when patient is stable, she is in respiratory distress,  not a good candidate for any procedure  # Dementia with agitation (HCC) Patient appears at baseline at this time.   - Continue home regimen - Delirium precautions   Body mass index is 23.03 kg/m.   Interventions:  Diet: NPO for now DVT Prophylaxis: Subcutaneous Lovenox   Advance goals of care discussion: DNR-Limited  Family Communication: family was not present at bedside, at the time of interview.  1/29 patient was on BiPAP, obtunded, unable to communicate. 1/29 I called and left a VM for patient's daughter to call back  Disposition:  Pt is from ALF, admitted with AKI, and developed respiratory failure, still has respiratory failure, which precludes a safe discharge. Discharge to SNF, when stable, may need few days to improve.  Subjective: No significant events overnight, in the morning time patient was very short of breath, work of breathing was more, got a text message from RN, patient was placed on BiPAP. During my exam patient was sleepy and on BiPAP, unable to offer any complaints.  Physical Exam: General: lying comfortably, mild respiratory distress on BiPAP Appear in no distress, affect appropriate Eyes: Closed ENT: on Bipap Neck: no JVD,  Cardiovascular: S1 and S2 Present, no Murmur,  Respiratory: Equal air entry bilaterally, bilateral crackles and mild wheezing. Abdomen: Bowel Sound present, Soft and no tenderness,  Skin: no rashes Extremities: no Pedal edema, no calf tenderness Neurologic: without any new focal findings Gait not checked due to patient safety concerns  Vitals:   08/25/23 0900 08/25/23 1208 08/25/23 1430 08/25/23 1446  BP: (!) 173/160  (!) 123/46   Pulse: (!) 114  (!) 108   Resp: (!) 34  (!) 39   Temp:  99.6 F (37.6 C)    TempSrc:  Axillary    SpO2: 96%  93% 93%  Weight:      Height:        Intake/Output Summary (Last 24 hours) at 08/25/2023 1529 Last data filed at 08/25/2023 9811 Gross per 24 hour  Intake 1066.67 ml  Output --  Net 1066.67 ml   Filed Weights   08/24/23 1209  Weight: 59 kg    Data Reviewed: I have personally reviewed and interpreted daily labs, tele strips, imagings as discussed above. I reviewed all nursing  notes, pharmacy notes, vitals, pertinent old records I have discussed plan of care as described above with RN and patient/family.  CBC: Recent Labs  Lab 08/24/23 1210 08/25/23 0345  WBC 24.2* 16.1*  NEUTROABS 21.3*  --   HGB 11.1* 9.4*  HCT 34.3* 28.9*  MCV 94.8 94.8  PLT 170 125*   Basic Metabolic Panel: Recent Labs  Lab 08/24/23 1210 08/25/23 0345  NA 134* 138  K 5.8* 4.8  CL 99 108  CO2 20* 21*  GLUCOSE 197* 132*  BUN 62* 43*  CREATININE 2.03* 1.67*  CALCIUM 10.0 9.2  MG  --  1.5*  PHOS  --  2.2*    Studies: DG Chest Port 1 View Result Date: 08/25/2023 CLINICAL DATA:  Shortness breath. EXAM: PORTABLE CHEST 1 VIEW COMPARISON:  Chest radiograph dated August 24, 2023. FINDINGS: The heart size is upper limits of normal. Aortic atherosclerosis. Mild streaky bibasilar opacities. No sizable pleural effusion. No pneumothorax. Osteopenia. Visualized osseous structures are unchanged. IMPRESSION: Mild streaky bibasilar opacities could reflect atelectasis or infiltrate. Electronically Signed   By: Hart Robinsons M.D.   On: 08/25/2023 08:39    Scheduled Meds:  enoxaparin (LOVENOX) injection  30 mg Subcutaneous Q24H   guaiFENesin  600 mg Oral BID   insulin aspart  0-9 Units Subcutaneous TID WC   ipratropium-albuterol  3 mL Nebulization Q6H   sodium chloride flush  3 mL Intravenous Q12H   Continuous Infusions:  cefTRIAXone (ROCEPHIN)  IV Stopped (08/25/23 1610)   magnesium sulfate bolus IVPB 4 g (08/25/23 1435)   metronidazole Stopped (08/25/23 1042)   potassium PHOSPHATE IVPB (in mmol) 15 mmol (08/25/23 1449)   PRN Meds: acetaminophen **OR** acetaminophen, chlorpheniramine-HYDROcodone, ondansetron **OR** ondansetron (ZOFRAN) IV  Time spent: 55 minutes  Author: Gillis Santa. MD [facility] 08/25/2023 3:29 PM  To reach On-call, see care teams to locate the attending and reach out to them via [email or web address]. If 7PM-7AM, please contact night-coverage If you still  have difficulty reaching the attending provider, please page the [facility] (Director on Call) for [facility] on amion for assistance.

## 2023-08-25 NOTE — Progress Notes (Signed)
PT Cancellation Note  Patient Details Name: Audrey Duran MRN: [id number] DOB: 03/05/1935   Cancelled Treatment:    Reason Eval/Treat Not Completed: Medical issues which prohibited therapy Spoke with nurse who reports she is not medically ready for PT, "just working to breath right now."  Will maintain on caseload and plan to see here when more medically appropriate.  Malachi Pro, DPT 08/25/2023, 4:22 PM

## 2023-08-25 NOTE — ED Notes (Signed)
Patient removed nasal cannula due to the tubing being uncomfortable at the back of her ears. Tape placed around tubing, and placed back on patient.

## 2023-08-26 ENCOUNTER — Inpatient Hospital Stay: Payer: PPO

## 2023-08-26 ENCOUNTER — Encounter: Payer: Self-pay | Admitting: Student

## 2023-08-26 DIAGNOSIS — N179 Acute kidney failure, unspecified: Secondary | ICD-10-CM | POA: Diagnosis not present

## 2023-08-26 DIAGNOSIS — K529 Noninfective gastroenteritis and colitis, unspecified: Secondary | ICD-10-CM | POA: Diagnosis not present

## 2023-08-26 DIAGNOSIS — J9601 Acute respiratory failure with hypoxia: Secondary | ICD-10-CM | POA: Diagnosis not present

## 2023-08-26 DIAGNOSIS — N189 Chronic kidney disease, unspecified: Secondary | ICD-10-CM | POA: Diagnosis not present

## 2023-08-26 LAB — BASIC METABOLIC PANEL
Anion gap: 13 (ref 5–15)
BUN: 38 mg/dL — ABNORMAL HIGH (ref 8–23)
CO2: 18 mmol/L — ABNORMAL LOW (ref 22–32)
Calcium: 9.1 mg/dL (ref 8.9–10.3)
Chloride: 107 mmol/L (ref 98–111)
Creatinine, Ser: 2.02 mg/dL — ABNORMAL HIGH (ref 0.44–1.00)
GFR, Estimated: 24 mL/min — ABNORMAL LOW (ref >60)
Glucose, Bld: 145 mg/dL — ABNORMAL HIGH (ref 70–99)
Potassium: 5 mmol/L (ref 3.5–5.1)
Sodium: 138 mmol/L (ref 135–145)

## 2023-08-26 LAB — MAGNESIUM: Magnesium: 2.6 mg/dL — ABNORMAL HIGH (ref 1.7–2.4)

## 2023-08-26 LAB — ECHOCARDIOGRAM COMPLETE
Area-P 1/2: 3.42 cm2
Height: 63 in
S' Lateral: 3.1 cm
Weight: 2080 [oz_av]

## 2023-08-26 LAB — HEMOGLOBIN A1C
Hgb A1c MFr Bld: 5.6 % (ref 4.8–5.6)
Mean Plasma Glucose: 114.02 mg/dL

## 2023-08-26 LAB — CBC
HCT: 29.9 % — ABNORMAL LOW (ref 36.0–46.0)
Hemoglobin: 9.7 g/dL — ABNORMAL LOW (ref 12.0–15.0)
MCH: 31.2 pg (ref 26.0–34.0)
MCHC: 32.4 g/dL (ref 30.0–36.0)
MCV: 96.1 fL (ref 80.0–100.0)
Platelets: 122 10*3/uL — ABNORMAL LOW (ref 150–400)
RBC: 3.11 MIL/uL — ABNORMAL LOW (ref 3.87–5.11)
RDW: 13.1 % (ref 11.5–15.5)
WBC: 13.5 10*3/uL — ABNORMAL HIGH (ref 4.0–10.5)
nRBC: 0 % (ref 0.0–0.2)

## 2023-08-26 LAB — CBG MONITORING, ED
Glucose-Capillary: 116 mg/dL — ABNORMAL HIGH (ref 70–99)
Glucose-Capillary: 130 mg/dL — ABNORMAL HIGH (ref 70–99)
Glucose-Capillary: 98 mg/dL (ref 70–99)

## 2023-08-26 LAB — PHOSPHORUS: Phosphorus: 4.3 mg/dL (ref 2.5–4.6)

## 2023-08-26 LAB — BRAIN NATRIURETIC PEPTIDE: B Natriuretic Peptide: 1990.3 pg/mL — ABNORMAL HIGH (ref 0.0–100.0)

## 2023-08-26 LAB — MRSA NEXT GEN BY PCR, NASAL: MRSA by PCR Next Gen: NOT DETECTED

## 2023-08-26 MED ORDER — SODIUM CHLORIDE 0.9 % IV SOLN: 250.0000 mL | INTRAVENOUS | Status: AC

## 2023-08-26 MED ORDER — SODIUM BICARBONATE 8.4 % IV SOLN
100.0000 meq | Freq: Once | INTRAVENOUS | Status: AC
  Administered 2023-08-26: 100 meq via INTRAVENOUS
  Filled 2023-08-26: qty 50

## 2023-08-26 MED ORDER — MIDODRINE HCL 5 MG PO TABS
10.0000 mg | ORAL_TABLET | Freq: Three times a day (TID) | ORAL | Status: DC
  Administered 2023-08-26 – 2023-08-27 (×6): 10 mg via ORAL
  Filled 2023-08-26 (×8): qty 2

## 2023-08-26 MED ORDER — CHLORHEXIDINE GLUCONATE CLOTH 2 % EX PADS
6.0000 | MEDICATED_PAD | Freq: Every day | CUTANEOUS | Status: DC
  Administered 2023-08-26: 6 via TOPICAL

## 2023-08-26 MED ORDER — NOREPINEPHRINE 4 MG/250ML-% IV SOLN
2.0000 ug/min | INTRAVENOUS | Status: DC
  Administered 2023-08-26: 2 ug/min via INTRAVENOUS
  Administered 2023-08-27: 6 ug/min via INTRAVENOUS
  Filled 2023-08-26 (×2): qty 250

## 2023-08-26 NOTE — ED Notes (Signed)
Have counseled pt multiple times to please wear mask when any staff enters the room.

## 2023-08-26 NOTE — ED Notes (Signed)
MD made aware MAP decreasing again below 65

## 2023-08-26 NOTE — ED Notes (Addendum)
Report given to icu RN, Yaakov Guthrie.

## 2023-08-26 NOTE — Progress Notes (Signed)
PT Cancellation Note  Patient Details Name: Audrey Duran MRN: [id number] DOB: 1935/05/26   Cancelled Treatment:    Reason Eval/Treat Not Completed: Medical issues which prohibited therapy Spoke with nurse who reports that pt is in less distress than yesterday but has been having very low BPs, still having increased WOB and is not yet ready to work with PT.  Will continue to follow and attempt to see as she is appropriate.    Malachi Pro, DPT 08/26/2023, 9:27 AM

## 2023-08-26 NOTE — Progress Notes (Signed)
       CROSS COVER NOTE  NAME: Audrey Duran MRN: [id number] DOB : 1935/01/31 ATTENDING PHYSICIAN: Gillis Santa, MD    Date of Service   08/26/2023   HPI/Events of Note   Informed of increased need of vasopressor for BP support  Interventions   Assessment/Plan: Discussion with ICU NP who has graciously offered to take over care Level of care change to ICU        Donnie Mesa NP [facility] Cross Cover 7pm-7am - check amion for availability Pager [phone]

## 2023-08-26 NOTE — ED Notes (Signed)
MD messaged in regard to BP and MAP decreasing

## 2023-08-26 NOTE — ED Notes (Signed)
Pt with soiled linens. Linens changes. New brief and purewick in place. Warm blankets provided.

## 2023-08-26 NOTE — Progress Notes (Signed)
OT Cancellation Note  Patient Details Name: Audrey Duran MRN: [id number] DOB: 1935/04/10   Cancelled Treatment:    Reason Eval/Treat Not Completed: Patient not medically ready. Consult received chart reviewed. Per chart/nursing, pt continues to have low BP issues. Will re-attempt next date as appropriate.   Arman Filter., MPH, MS, OTR/L ascom [phone] 08/26/23, 2:28 PM

## 2023-08-26 NOTE — Evaluation (Signed)
Clinical/Bedside Swallow Evaluation Patient Details  Name: Audrey Duran MRN: [id number] Date of Birth: Jun 17, 1935  Today's Date: 08/26/2023 Time: SLP Start Time (ACUTE ONLY): 1215 SLP Stop Time (ACUTE ONLY): 1235 SLP Time Calculation (min) (ACUTE ONLY): 20 min  Past Medical History:  Past Medical History:  Diagnosis Date   Anemia    Atrial fibrillation (HCC)    COPD (chronic obstructive pulmonary disease) (HCC)    Coronary artery disease 1995   s/p AMI , no history of stents,  [name]   Hyperlipidemia    Ischemic cardiomyopathy Dec 2011   ETT Sestamibi study apical scar, no ischemia, [name]   Myocardial infarction (HCC)    Recurrent HSV (herpes simplex virus)    Past Surgical History:  Past Surgical History:  Procedure Laterality Date   ABDOMINAL HYSTERECTOMY  june 2014   BREAST EXCISIONAL BIOPSY Left 1990's   neg   CAROTID ENDARTERECTOMY  July 2007   Dew, right carotid   CATARACT EXTRACTION  2004, 2006   JOINT REPLACEMENT  July 2013   total hip , [place]   LAPAROSCOPIC APPENDECTOMY N/A 02/20/2020   Procedure: APPENDECTOMY LAPAROSCOPIC;  Surgeon: Leafy Ro, MD;  Location: [facility] ORS;  Service: General;  Laterality: N/A;   HPI:  Per MD Progress Note: "Audrey Duran is a 89 y.o. female with medical history significant of dementia, CAD s/p PCI (1995), chronic systolic CHF, hypertension, hyperlipidemia, CKD stage IIIa, hyperkalemia 2/2 lisinopril, type 2 diabetes, COPD, who presents to the ED due to nausea/vomiting/diarrhea and weakness. CXR: Mild streaky bibasilar opacities could reflect atelectasis or infiltrate. CT scan: Right lower lobe developing infiltrate or aspiration." Pt is currently on a clear liquids diet and 5L O2 via nasal canula.    Assessment / Plan / Recommendation  Clinical Impression  Pt seen for bedside swallow assessment in the setting of concern for aspiration PNA. Pt seen with trials of thin liquids (via straw and cup and spoon and regular  solids. Pt able to self feed for all trials, requiring assistance for setting up trials only. Congested cough noted x2 with thin liquids via straw, not repeated with change to cup sips of thin liquid. No change to vocal quality across trials. Vitals stable for duration of trials (O2 saturations maintained at 94-96 for duration of session). Oral phase grossly intact- with complete manipulation and clearance of regular solid from oral cavity. However, suspect low endurance would likely impact ability to complete repeated regular solid trials. Pt denied history of PNA, GERD, or dysphagia.       Based on age, current debility, and respiratory/pulmonary state, pt is at increased risk of aspiration, therefore recommend aspiration precautions (slow rate, small bites, elevated HOB, and alert for PO intake). Pt currently limited to clear liquid diet per MD recommendations, though from a dysphagia standpoint, pt could be advanced to solids as tolerated. SLP will monitor for continued safety with current diet. MD and RN aware of recommendations. SLP Visit Diagnosis: Dysphagia, unspecified (R13.10)    Aspiration Risk  Mild aspiration risk    Diet Recommendation   Thin;Age appropriate regular (when cleared by MD team)  Medication Administration: Whole meds with liquid    Other  Recommendations Oral Care Recommendations: Oral care BID    Recommendations for follow up therapy are one component of a multi-disciplinary discharge planning process, led by the attending physician.  Recommendations may be updated based on patient status, additional functional criteria and insurance authorization.  Follow up Recommendations No SLP follow up (expected  at discharge)      Assistance Recommended at Discharge    Functional Status Assessment Patient has had a recent decline in their functional status and demonstrates the ability to make significant improvements in function in a reasonable and predictable amount of time.   Frequency and Duration min 2x/week  1 week       Prognosis Prognosis for improved oropharyngeal function: Good Barriers to Reach Goals: Cognitive deficits (baseline dementia)      Swallow Study   General Date of Onset: 08/26/23 HPI: Per MD Progress Note: "Audrey Duran is a 89 y.o. female with medical history significant of dementia, CAD s/p PCI (1995), chronic systolic CHF, hypertension, hyperlipidemia, CKD stage IIIa, hyperkalemia 2/2 lisinopril, type 2 diabetes, COPD, who presents to the ED due to nausea/vomiting/diarrhea and weakness. CXR: Mild streaky bibasilar opacities could reflect atelectasis or infiltrate. CT scan: Right lower lobe developing infiltrate or aspiration." Pt is currently on a clear liquids diet and 5L O2 via nasal canula. Type of Study: Bedside Swallow Evaluation Previous Swallow Assessment: none in chart Diet Prior to this Study: Clear liquid diet;Thin liquids (Level 0) Temperature Spikes Noted: No (WBC 13.5) Respiratory Status: Nasal cannula (5L) History of Recent Intubation: No Behavior/Cognition: Alert;Pleasant mood;Cooperative Oral Cavity Assessment: Within Functional Limits Oral Care Completed by SLP: No (pt eating lunch upon therapist arrival) Oral Cavity - Dentition: Adequate natural dentition Vision: Functional for self-feeding Self-Feeding Abilities: Able to feed self;Needs set up Patient Positioning: Upright in bed Baseline Vocal Quality: Normal Volitional Cough: Congested Volitional Swallow: Able to elicit    Oral/Motor/Sensory Function Overall Oral Motor/Sensory Function: Within functional limits   Ice Chips Ice chips: Not tested   Thin Liquid Thin Liquid: Impaired Presentation: Cup;Straw Oral Phase Impairments:  (none) Oral Phase Functional Implications:  (none) Pharyngeal  Phase Impairments: Cough - Delayed (with straw)    Nectar Thick Nectar Thick Liquid: Not tested   Honey Thick Honey Thick Liquid: Not tested   Puree Puree: Not  tested   Solid     Solid: Within functional limits     Audrey Tammela Bales Clapp  MS [facility] SLP   Audrey Duran 08/26/2023,2:14 PM

## 2023-08-26 NOTE — ED Notes (Signed)
Admission MD messaged in regard to pt's trending Bps

## 2023-08-26 NOTE — ED Notes (Signed)
Called icu to give report, spoke w/ charge, rn who states someone will call back to receive report. Pt to go ICU1

## 2023-08-26 NOTE — Progress Notes (Signed)
[facility] Progress Note  Patient: Audrey Duran    NWG:[id number]  DOA: 08/24/2023     Date of Service: the patient was seen and examined on 08/26/2023  Chief Complaint  Patient presents with   Weakness   Vomiting   Diarrhea   Brief hospital course: Audrey Duran is a 89 y.o. female with medical history significant of dementia, CAD s/p PCI (1995), chronic systolic CHF, hypertension, hyperlipidemia, CKD stage IIIa, hyperkalemia 2/2 lisinopril, type 2 diabetes, COPD, who presents to the ED due to nausea/vomiting/diarrhea and weakness.  History obtained from both patient and her daughter at bedside. Audrey Duran states she has been experiencing severe nausea and vomiting for 2-3 days now.  She notes that she has not eaten in 2 days but has been trying to drink some ginger ale.  She additionally endorses diarrhea, however her daughter states facility has not reported any diarrhea.  Today she went to her PCPs office and was noted to be very weak and hypotensive.  Due to this, she was sent to the ED.  Audrey Duran endorses a cough but is not sure when it started, her daughter states it was not present 4 days ago.  She denies any shortness of breath.   ED course: On arrival to the ED, patient was hypotensive at 91/36 with MAP of 49.  She was saturating at 93% on room air.  She was afebrile at 97.8.Initial workup notable for WBC of 24.2, hemoglobin 11.1, potassium 5.8, glucose 197, BUN 62, creatinine 2.03 with GFR of 23.  Lactic acid 2.1.  CT of the abdomen was obtained with no acute abnormalities.  Patient started on IV fluids and Zofran; [facility] contacted for admission  Assessment and Plan:  # Acute hypoxic respiratory failure most likely due to pneumonia, possible aspiration Continue supplemental O2 admission and gradually wean off S/p BiPAP, improved and taken off  of Bipap  WBC and procalcitonin elevated Started empiric antibiotics ceftriaxone and Flagyl Patient is allergic to  penicillin Continue DuoNeb every 6 hourly,  Mucinex 600 mg twice daily Continue fall and aspiration precautions BNP 1008 elevated, Lasix 20 mg one-time dose given today CXR: Mild streaky bibasilar opacities could reflect atelectasis or infiltrate CT scan: Right lower lobe developing infiltrate or aspiration.  Follow pulmonary consult respiratory viral panel negative    # Hypomagnesemia, mag repleted. # Phosphatemia, Phos repleted. Monitor electrolytes and replete as needed.  # Hyperkalemia resolved  # AKI due to dehydration, prerenal Creatinine is improving Monitor renal functions and urine output Avoid nephrotoxic medications, use renally dose medications  # Acute gastroenteritis Currently stable, no diarrhea.  Send stool sample if diarrhea reoccurs Watch for any dehydration Continue symptomatic treatment  # Hypotension, due to dehydration, hypovolemia Status post IV fluid given for hydration 1/30 started midodrine 10 mg p.o. 3 times daily with holding parameters Persistent low blood pressure noticed, started peripheral Levophed for pressure support Monitor BP  # HFpEF Elevated BNP could be due to volume overload after bolus 1/29 s/p Lasix 20 mg IV one-time dose given TTE shows LVEF 50 to 55%, grade 1 diastolic dysfunction, moderate TR Monitor BP Monitor volume status  # Paroxysmal atrial fibrillation  Currently in sinus rhythm. No longer on anticoagulation since 2013 per chart review.    # Coronary artery disease - Continue home aspirin and statin.  # Essential hypertension - Hold home antihypertensives in the setting of hypotension and AKI  # Anemia of chronic disease Monitor H&H  # T11 compression fracture  CT scan: Progression of T11 compression fracture since the prior CT. Correlation with clinical exam and point tenderness recommended. Continue pain management Will consult to spine surgery when patient is stable, she is in respiratory distress, not a good  candidate for any procedure  # Dementia with agitation  Patient appears at baseline at this time. - Continue home regimen - Delirium precautions   Body mass index is 23.03 kg/m.  Interventions:  Diet: Regular diet with fluid striction 1.52/day DVT Prophylaxis: Subcutaneous Lovenox   Advance goals of care discussion: DNR-Limited  Family Communication: family was not present at bedside, at the time of interview.  1/29 patient was on BiPAP, obtunded, unable to communicate. 1/29 I called and left a VM for patient's daughter to call back 1/30 again called today and left a VM  Disposition:  Pt is from ALF, admitted with AKI, and developed respiratory failure, still has respiratory failure, which precludes a safe discharge. Discharge to SNF, when stable, may need few days to improve.  Subjective: No significant events overnight, patient is more awake and alert today, AO x 1, slow to respond.  Improved as compared to yesterday, breathing is improving, denies any chest pain or palpitations.  Physical Exam: General: lying comfortably, mild respiratory distress  Appear in no distress, affect appropriate Eyes: PERRLA ENT: Oral mucosa clear and moist Neck: no JVD,  Cardiovascular: S1 and S2 Present, no Murmur,  Respiratory: Equal air entry bilaterally, bilateral crackles and no wheezes. Abdomen: Bowel Sound present, Soft and no tenderness,  Skin: no rashes Extremities: no Pedal edema, no calf tenderness Neurologic: without any new focal findings Gait not checked due to patient safety concerns  Vitals:   08/26/23 1330 08/26/23 1343 08/26/23 1400 08/26/23 1430  BP: (!) 99/38  (!) 98/40 112/67  Pulse: 88 93 94 94  Resp: (!) 27 (!) 25 20 (!) 22  Temp:      TempSrc:      SpO2: 99% 92% 93% 95%  Weight:      Height:        Intake/Output Summary (Last 24 hours) at 08/26/2023 1612 Last data filed at 08/26/2023 0604 Gross per 24 hour  Intake 1167.21 ml  Output 500 ml  Net 667.21 ml    Filed Weights   08/24/23 1209  Weight: 59 kg    Data Reviewed: I have personally reviewed and interpreted daily labs, tele strips, imagings as discussed above. I reviewed all nursing notes, pharmacy notes, vitals, pertinent old records I have discussed plan of care as described above with RN and patient/family.  CBC: Recent Labs  Lab 08/24/23 1210 08/25/23 0345 08/26/23 0014  WBC 24.2* 16.1* 13.5*  NEUTROABS 21.3*  --   --   HGB 11.1* 9.4* 9.7*  HCT 34.3* 28.9* 29.9*  MCV 94.8 94.8 96.1  PLT 170 125* 122*   Basic Metabolic Panel: Recent Labs  Lab 08/24/23 1210 08/25/23 0345 08/26/23 0014  NA 134* 138 138  K 5.8* 4.8 5.0  CL 99 108 107  CO2 20* 21* 18*  GLUCOSE 197* 132* 145*  BUN 62* 43* 38*  CREATININE 2.03* 1.67* 2.02*  CALCIUM 10.0 9.2 9.1  MG  --  1.5* 2.6*  PHOS  --  2.2* 4.3    Studies: ECHOCARDIOGRAM COMPLETE Result Date: 08/26/2023    ECHOCARDIOGRAM REPORT   Patient Name:   Audrey Duran Date of Exam: 08/25/2023 Medical Rec #:  [id number]         Height:  63.0 in Accession #:    [id number]        Weight:       130.0 lb Date of Birth:  17-Jul-1935        BSA:          1.610 m Patient Age:    88 years          BP:           86/35 mmHg Patient Gender: F                 HR:           89 bpm. Exam Location:  [facility] Procedure: 2D Echo, Cardiac Doppler and Color Doppler Indications:     I42.9 Cardiomyopathy  History:         Patient has no prior history of Echocardiogram examinations.                  CAD and Previous Myocardial Infarction, COPD, Arrythmias:Atrial                  Fibrillation; Risk Factors:Dyslipidemia. Ischemic                  cardiomyopathy.  Sonographer:     Daphine Deutscher RDCS Referring Phys:  [id number] CARMEN L GONZALEZ Diagnosing Phys: Marcina Millard MD IMPRESSIONS  1. Left ventricular ejection fraction, by estimation, is 50 to 55%. The left ventricle has low normal function. The left ventricle has no regional wall motion  abnormalities. Left ventricular diastolic parameters are consistent with Grade I diastolic dysfunction (impaired relaxation).  2. Right ventricular systolic function is normal. The right ventricular size is normal.  3. The mitral valve is normal in structure. Mild mitral valve regurgitation. No evidence of mitral stenosis.  4. Tricuspid valve regurgitation is moderate.  5. The aortic valve is normal in structure. Aortic valve regurgitation is not visualized. No aortic stenosis is present.  6. The inferior vena cava is normal in size with greater than 50% respiratory variability, suggesting right atrial pressure of 3 mmHg. FINDINGS  Left Ventricle: Left ventricular ejection fraction, by estimation, is 50 to 55%. The left ventricle has low normal function. The left ventricle has no regional wall motion abnormalities. The left ventricular internal cavity size was normal in size. There is no left ventricular hypertrophy. Left ventricular diastolic parameters are consistent with Grade I diastolic dysfunction (impaired relaxation). Right Ventricle: The right ventricular size is normal. No increase in right ventricular wall thickness. Right ventricular systolic function is normal. Left Atrium: Left atrial size was normal in size. Right Atrium: Right atrial size was normal in size. Pericardium: There is no evidence of pericardial effusion. Mitral Valve: The mitral valve is normal in structure. Mild mitral valve regurgitation. No evidence of mitral valve stenosis. Tricuspid Valve: The tricuspid valve is normal in structure. Tricuspid valve regurgitation is moderate . No evidence of tricuspid stenosis. Aortic Valve: The aortic valve is normal in structure. Aortic valve regurgitation is not visualized. No aortic stenosis is present. Pulmonic Valve: The pulmonic valve was normal in structure. Pulmonic valve regurgitation is not visualized. No evidence of pulmonic stenosis. Aorta: The aortic root is normal in size and structure.  Venous: The inferior vena cava is normal in size with greater than 50% respiratory variability, suggesting right atrial pressure of 3 mmHg. IAS/Shunts: No atrial level shunt detected by color flow Doppler.  LEFT VENTRICLE PLAX 2D LVIDd:         4.10 cm  Diastology LVIDs:         3.10 cm   LV e' medial:    5.87 cm/s LV PW:         0.90 cm   LV E/e' medial:  9.6 LV IVS:        0.90 cm   LV e' lateral:   7.94 cm/s LVOT diam:     1.80 cm   LV E/e' lateral: 7.1 LV SV:         41 LV SV Index:   26 LVOT Area:     2.54 cm  RIGHT VENTRICLE             IVC RV Basal diam:  3.30 cm     IVC diam: 1.00 cm RV S prime:     13.30 cm/s TAPSE (M-mode): 2.9 cm LEFT ATRIUM             Index        RIGHT ATRIUM           Index LA diam:        3.40 cm 2.11 cm/m   RA Area:     10.70 cm LA Vol (A2C):   30.4 ml 18.88 ml/m  RA Volume:   24.10 ml  14.97 ml/m LA Vol (A4C):   20.6 ml 12.79 ml/m LA Biplane Vol: 25.3 ml 15.71 ml/m  AORTIC VALVE LVOT Vmax:   94.67 cm/s LVOT Vmean:  65.233 cm/s LVOT VTI:    0.163 m  AORTA Ao Root diam: 3.20 cm Ao Asc diam:  3.30 cm MITRAL VALVE               TRICUSPID VALVE MV Area (PHT): 3.42 cm    TR Peak grad:   32.5 mmHg MV Decel Time: 222 msec    TR Vmax:        285.00 cm/s MV E velocity: 56.10 cm/s MV A velocity: 91.45 cm/s  SHUNTS MV E/A ratio:  0.61        Systemic VTI:  0.16 m                            Systemic Diam: 1.80 cm Marcina Millard MD Electronically signed by Marcina Millard MD Signature Date/Time: 08/26/2023/1:41:21 PM    Final    DG Chest Port 1 View Result Date: 08/26/2023 CLINICAL DATA:  89 year old female with history of acute hypoxic respiratory failure. EXAM: PORTABLE CHEST 1 VIEW COMPARISON:  Chest x-ray 08/25/2023. FINDINGS: Lung volumes are low. Elevation of the right hemidiaphragm. Bibasilar opacities (right greater than left) which may reflect areas of atelectasis and/or consolidation, with superimposed moderate right and small left pleural effusions. No  pneumothorax. No evidence of pulmonary edema. Heart size is borderline enlarged. Upper mediastinal contours are within normal limits. Atherosclerotic calcifications in the thoracic aorta. IMPRESSION: 1. Low lung volumes with bibasilar areas of atelectasis and/or consolidation (right-greater-than-left) with superimposed moderate right and small left pleural effusions. 2. Aortic atherosclerosis. Electronically Signed   By: [name] M.D.   On: 08/26/2023 06:01    Scheduled Meds:  enoxaparin (LOVENOX) injection  30 mg Subcutaneous Q24H   guaiFENesin  600 mg Oral BID   insulin aspart  0-9 Units Subcutaneous TID WC   ipratropium-albuterol  3 mL Nebulization Q6H   midodrine  10 mg Oral TID WC   sodium chloride flush  3 mL Intravenous Q12H   Continuous Infusions:  sodium chloride Stopped (08/26/23 1357)  cefTRIAXone (ROCEPHIN)  IV Stopped (08/26/23 1610)   metronidazole Stopped (08/26/23 1137)   norepinephrine (LEVOPHED) Adult infusion 3 mcg/min (08/26/23 1419)   PRN Meds: acetaminophen **OR** acetaminophen, chlorpheniramine-HYDROcodone, ondansetron **OR** ondansetron (ZOFRAN) IV  Time spent: 55 minutes  Author: Gillis Santa. MD [facility] 08/26/2023 4:12 PM  To reach On-call, see care teams to locate the attending and reach out to them via [email or web address]. If 7PM-7AM, please contact night-coverage If you still have difficulty reaching the attending provider, please page the [facility] (Director on Call) for [facility] on amion for assistance.

## 2023-08-26 NOTE — Progress Notes (Signed)
eLink Physician-Brief Progress Note Patient Name: Audrey Duran DOB: 1935/07/06 MRN: [id number]   Date of Service  08/26/2023  HPI/Events of Note  Patient admitted  with symptoms compatible with gastroenteritis, associated with profound volume and electrolyte abnormalities.  eICU Interventions  New Patient Evaluation.        Migdalia Dk 08/26/2023, 8:49 PM

## 2023-08-26 NOTE — TOC Progression Note (Signed)
Transition of Care [facility]) - Progression Note    Patient Details  Name: Audrey Duran MRN: [id number] Date of Birth: 1934-09-18  [facility]) CM/SW Contact  Tory Emerald, [place] Phone Number: 08/26/2023, 4:50 PM  Clinical Narrative:     CSW noted [facility] consult; however, CSW unable to complete consult until PT/OT complete assessment and make recommendations. CSW asked attending to re-consult TOC once PT/OT assess and make recs. CSW closing consult.         Expected Discharge Plan and Services                                               Social Determinants of Health (SDOH) Interventions SDOH Screenings   Food Insecurity: No Food Insecurity (08/26/2023)  Housing: Low Risk  (08/26/2023)  Transportation Needs: No Transportation Needs (08/26/2023)  Utilities: Not At Risk (08/26/2023)  Depression (PHQ2-9): Low Risk  (10/02/2022)  Financial Resource Strain: Low Risk  (12/03/2020)  Social Connections: Moderately Isolated (08/26/2023)  Stress: No Stress Concern Present (12/03/2020)  Tobacco Use: Medium Risk (08/26/2023)    Readmission Risk Interventions     No data to display

## 2023-08-26 NOTE — Progress Notes (Signed)
NAME:  Audrey Duran, MRN:  [id number], DOB:  28-May-1935, LOS: 1 ADMISSION DATE:  08/24/2023, CONSULTATION DATE: 08/25/2023 REFERRING MD: Gillis Santa, MD, CHIEF COMPLAINT: Acute respiratory failure, possible aspiration pneumonia  History of Present Illness:  Patient is an 89 year old female, remote former smoker, with complex medical history as noted below to include dementia, coronary artery disease, CKD IIIa and cardiomyopathy who presented to [facility] ED on 24 August 2023 due to nausea, vomiting and generalized weakness.  History is obtained from the records and from the patient's bedside nurse.  No family is present during the evaluation and the patient is lethargic and on noninvasive ventilation.  As stated, the patient was transported from her primary care practitioner's office yesterday.  She was noted to be hypotensive and appeared volume depleted when evaluated at her primary physician's office (Dr. Wallene Huh).  She presented there stating that she had had vomiting over the past several days, she also exhibited vomiting during the visit at the primary care physician's office.  She did have 1 episode of near fecal incontinence in the clinic.  She was evaluated by the hospitalist team in the ED the patient was noted to be hypotensive and apparently volume depleted.  She was admitted with a working diagnosis of acute gastroenteritis, hypotension, acute kidney injury superimposed on chronic kidney disease and cough.  Currently the patient cannot endorse any symptomatology due to her lethargy and need for noninvasive ventilation.  This morning the patient was noted to have persistent oxygen requirements at 4 L/min nasal cannula O2 but she developed significant tachypnea with respiratory rate of 44.  She was placed on BiPAP.  There is suspicion of aspiration, chest x-ray obtained this morning shows mild streaky basilar opacities.  Pulmonary has been consulted to assist with management.  The patient is  DNR.  The patient resides at the [facility].  I contacted the patient's daughter and updated her on the above issues.  She did not have much to add with regards to history.  Pertinent  Medical History  Dementia with prior episodes of delirium CKD IIIa Ischemic cardiomyopathy HFpEF 50% (echo 2017) ?  COPD (no FEV1 on record) Paroxysmal atrial fibrillation  Significant Hospital Events: Including procedures, antibiotic start and stop dates in addition to other pertinent events   01/28 admitted with working diagnosis of gastroenteritis, hypotension and volume depletion 01/29 developed respiratory distress, required BiPAP, high risk for aspiration, Pulmonary Medicine consulted, BNP elevated 01/30 marked improvement in mental status weaned to 4 L/min nasal cannula  Interim History / Subjective:  Patient is currently with altered mental status not very interactive.  On BiPAP.  Received Lasix IV x 1  Objective   Blood pressure (!) 104/49, pulse 88, temperature 98 F (36.7 C), temperature source Oral, resp. rate (!) 32, height 5\' 3"  (1.6 m), weight 59 kg, SpO2 96%.     Intake/Output Summary (Last 24 hours) at 08/26/2023 1316 Last data filed at 08/26/2023 0604 Gross per 24 hour  Intake 1167.21 ml  Output 500 ml  Net 667.21 ml   Filed Weights   08/24/23 1209  Weight: 59 kg   SpO2: 96 % O2 Flow Rate (L/min): (S) 4 L/min FiO2 (%): (S) 70 %   Examination: GENERAL: Debilitated, chronically ill-appearing woman, sitting up in stretcher, answering questions appropriately, awake and alert.  Able to tolerate p.o.'s this a.m. HEAD: Normocephalic, atraumatic.  EYES: Pupils equal, round, reactive to light.  No scleral icterus.  MOUTH: Edentulous, oral mucosa  moist. NECK: Supple. No thyromegaly. Trachea midline. No JVD.  No adenopathy. PULMONARY: Good air entry bilaterally.  No rhonchi noted, crackles at the bases. CARDIOVASCULAR: S1 and S2.  Regular rate and rhythm.   Grade 1/6 systolic ejection murmur the left sternal border. ABDOMEN: Nondistended, diminished bowel sounds but present, soft, nontender. MUSCULOSKELETAL: No joint deformity, no clubbing, no edema.  NEUROLOGIC: No overt focal deficit.  Mildly befuddled but interactive. SKIN: Intact,warm,dry. PSYCH: Mildly befuddled.  No agitation.  Calm, cooperative.  Reviewed imaging  Chest x-ray 1/29  shows cardiomegaly, bibasilar faint infiltrates versus atelectasis:   Chest x-ray today shows cardiomegaly, bilateral pleural effusions right greater than left and bibasilar atelectasis   Assessment & Plan:  Acute respiratory failure with hypoxia, resolving Ddx: Aspiration event versus decompensation of heart failure (BNP elevated) after volume repletion O2 weaned off to 4 L/min nasal cannula Titrate O2 for oxygen saturations at 90% or better Continue empiric antibiotics for now Patient received 1 dose of Lasix 01/29 BNP elevated at over 1900, echo results pending Suspect cardiac component to decompensation Continue nebulization treatments for mucociliary clearance Trend chest x-ray, BNP  Hypovolemic shock due to severe nausea and vomiting Patient was aggressively volume resuscitated Blood pressure stable this morning Appears to be now volume overloaded after initial volume resuscitation KVO fluids Received Lasix x 1, 01/29, urine output has picked up Echocardiogram results pending  Acute kidney injury superimposed on CKD IIIa Electrolyte derangements: Hypophosphatemia and hypomagnesemia Creatinine with mild worsening today, BUN improving Avoid nephrotoxic agents Continue to monitor renal function and electrolytes Supplement electrolytes as needed  Acute encephalopathy due to metabolic derangements/acute illness - resolving Underlying dementia Orient patient frequently Correct metabolic derangements Continue O2 for hypoxia  Goals of care Patient is DNR/DNI Multiple admissions over the  last year to the hospital Consider palliative care consultation  Best Practice (right click and "Reselect all SmartList Selections" daily)   Diet/type: NPO DVT prophylaxis LMWH Pressure ulcer(s): pressure ulcer assessment deferred  GI prophylaxis: N/A Lines: N/A Foley:  Yes, and it is still needed Code Status:  DNR Last date of multidisciplinary goals of care discussion [08/25/2023, via phone conversation with patient's daughter Renee]  Consider palliative care consultation.  Labs   CBC: Recent Labs  Lab 08/24/23 1210 08/25/23 0345 08/26/23 0014  WBC 24.2* 16.1* 13.5*  NEUTROABS 21.3*  --   --   HGB 11.1* 9.4* 9.7*  HCT 34.3* 28.9* 29.9*  MCV 94.8 94.8 96.1  PLT 170 125* 122*    Basic Metabolic Panel: Recent Labs  Lab 08/24/23 1210 08/25/23 0345 08/26/23 0014  NA 134* 138 138  K 5.8* 4.8 5.0  CL 99 108 107  CO2 20* 21* 18*  GLUCOSE 197* 132* 145*  BUN 62* 43* 38*  CREATININE 2.03* 1.67* 2.02*  CALCIUM 10.0 9.2 9.1  MG  --  1.5* 2.6*  PHOS  --  2.2* 4.3   GFR: Estimated Creatinine Clearance: 16.5 mL/min (A) (by C-G formula based on SCr of 2.02 mg/dL (H)). Recent Labs  Lab 08/24/23 1210 08/24/23 1517 08/25/23 0345 08/26/23 0014  WBC 24.2*  --  16.1* 13.5*  LATICACIDVEN 2.1* 1.6  --   --     Liver Function Tests: Recent Labs  Lab 08/24/23 1210  AST 18  ALT 12  ALKPHOS 49  BILITOT 0.7  PROT 6.8  ALBUMIN 3.4*   Recent Labs  Lab 08/24/23 1210  LIPASE 29   BNP (last 3 results) Recent Labs    08/25/23 0345 08/26/23  0014  BNP 1,008.0* 1,990.3*   ABG    Component Value Date/Time   PHART 7.34 (L) 08/25/2023 0852   PCO2ART 41 08/25/2023 0852   PO2ART 61 (L) 08/25/2023 0852   HCO3 22.1 08/25/2023 0852   ACIDBASEDEF 3.5 (H) 08/25/2023 0852   O2SAT 92.4 08/25/2023 0852     Coagulation Profile: Recent Labs  Lab 08/24/23 1210  INR 1.1    Cardiac Enzymes: No results for input(s): "CKTOTAL", "CKMB", "CKMBINDEX", "TROPONINI" in the  last 168 hours.  HbA1C: Hemoglobin A1C  Date/Time Value Ref Range Status  02/17/2012 05:04 AM 5.7 4.2 - 6.3 % Final    Comment:    The American Diabetes Association recommends that a primary goal of therapy should be <7% and that physicians should reevaluate the treatment regimen in patients with HbA1c values consistently >8%.    Hgb A1c MFr Bld  Date/Time Value Ref Range Status  08/26/2023 12:14 AM 5.6 4.8 - 5.6 % Final    Comment:    (NOTE) Pre diabetes:          5.7%-6.4%  Diabetes:              >6.4%  Glycemic control for   <7.0% adults with diabetes   10/02/2022 11:23 AM 5.5 4.6 - 6.5 % Final    Comment:    Glycemic Control Guidelines for People with Diabetes:Non Diabetic:  <6%Goal of Therapy: <7%Additional Action Suggested:  >8%     CBG: Recent Labs  Lab 08/25/23 1154 08/25/23 1643 08/25/23 2328 08/26/23 0819 08/26/23 1213  GLUCAP 148* 156* 123* 116* 130*    Review of Systems:   Unable to obtain full review of systems due to the patient's altered mental status and acuity of the situation.  Past Medical History:  She,  has a past medical history of Anemia, Atrial fibrillation (HCC), COPD (chronic obstructive pulmonary disease) (HCC), Coronary artery disease (1995), Hyperlipidemia, Ischemic cardiomyopathy (Dec 2011), Myocardial infarction [facility]), and Recurrent HSV (herpes simplex virus).   Surgical History:   Past Surgical History:  Procedure Laterality Date   ABDOMINAL HYSTERECTOMY  june 2014   BREAST EXCISIONAL BIOPSY Left 1990's   neg   CAROTID ENDARTERECTOMY  July 2007   Dew, right carotid   CATARACT EXTRACTION  2004, 2006   JOINT REPLACEMENT  July 2013   total hip , [place]   LAPAROSCOPIC APPENDECTOMY N/A 02/20/2020   Procedure: APPENDECTOMY LAPAROSCOPIC;  Surgeon: Leafy Ro, MD;  Location: [facility] ORS;  Service: General;  Laterality: N/A;     Social History:   reports that she quit smoking about 31 years ago. Her smoking use included cigarettes.  She has never used smokeless tobacco. She reports that she does not drink alcohol and does not use drugs.   Family History:  Her family history includes Breast cancer (age of onset: 44) in her sister; Cancer (age of onset: 64) in her sister; Cancer (age of onset: 17) in her mother; Heart disease in her father; Multiple sclerosis in her daughter.   Allergies Allergies  Allergen Reactions   Azithromycin     GI upset   Clarithromycin     ? Rash   Diclofenac     ? Rash   Lisinopril Other (See Comments)    hyperkalemia   Meloxicam     ? Rash   Nitrofurantoin Monohyd Macro     ? rash   Zocor [Simvastatin]    Penicillins Rash     Home Medications  Prior to Admission medications  Medication Sig Start Date End Date Taking? Authorizing Provider  acetaminophen (TYLENOL) 325 MG tablet Take by mouth every 4 (four) hours as needed.   Yes [provider]  acyclovir (ZOVIRAX) 400 MG tablet Take 400 mg by mouth daily. 02/09/23  Yes [provider]  alendronate (FOSAMAX) 70 MG tablet Take 1 tablet by mouth every 7 days on an empty stomach with a full glass of water 04/27/22  Yes Sherlene Shams, MD  alum & mag hydroxide-simeth (MAALOX/MYLANTA) 200-200-20 MG/5ML suspension Take 30 mLs by mouth every 6 (six) hours as needed for indigestion or heartburn.   Yes [provider]  aspirin 81 MG tablet Take 81 mg by mouth at bedtime.   Yes [provider]  Cholecalciferol (VITAMIN D3) 75 MCG (3000 UT) TABS Take 1 tablet by mouth daily. Take one by mouth daily 04/27/22  Yes Sherlene Shams, MD  citalopram (CELEXA) 20 MG tablet Take 1 tablet (20 mg total) by mouth daily. 01/11/23  Yes Sherlene Shams, MD  diphenhydrAMINE (BENADRYL) 25 mg capsule Take 25 mg by mouth every 6 (six) hours as needed.   Yes [provider]  furosemide (LASIX) 20 MG tablet Take 20 mg by mouth daily. 07/19/23  Yes [provider]  guaifenesin (ROBITUSSIN) 100 MG/5ML syrup Take 300  mg by mouth 4 (four) times daily as needed for cough.   Yes [provider]  lidocaine (XYLOCAINE) 5 % ointment Apply 1 Application topically as needed. 03/06/23  Yes Marrion Coy, MD  lisinopril (ZESTRIL) 5 MG tablet Take 5 mg by mouth daily.   Yes [provider]  loperamide (IMODIUM) 2 MG capsule Take 4 mg by mouth as needed for diarrhea or loose stools.   Yes [provider]  LORazepam (ATIVAN) 0.5 MG tablet Take 0.5 mg by mouth every 6 (six) hours as needed for anxiety. 05/28/23  Yes [provider]  magnesium hydroxide (MILK OF MAGNESIA) 400 MG/5ML suspension Take 30 mLs by mouth daily as needed for mild constipation.   Yes [provider]  magnesium oxide (MAG-OX) 400 MG tablet Take 400 mg by mouth daily.   Yes [provider]  mirtazapine (REMERON) 7.5 MG tablet Take 1 tablet (7.5 mg total) by mouth at bedtime. 10/12/22 08/24/23 Yes Chesley Noon, MD  risperiDONE (RISPERDAL) 0.25 MG tablet Take 1 tablet (0.25 mg total) by mouth at bedtime. Patient taking differently: Take 0.25 mg by mouth 2 (two) times daily. 10/12/22 08/25/23 Yes Chesley Noon, MD  rosuvastatin (CRESTOR) 20 MG tablet Take 1 tablet (20 mg total) by mouth daily. Patient taking differently: Take 20 mg by mouth at bedtime. 04/27/22  Yes Sherlene Shams, MD  timolol (TIMOPTIC) 0.5 % ophthalmic solution Place 1 drop into both eyes 2 (two) times daily. 02/24/23  Yes [provider]  ALPRAZolam Prudy Feeler) 0.5 MG tablet Take 0.5 mg by mouth daily. Patient not taking: Reported on 08/25/2023 01/12/23   [provider]  cyanocobalamin 1000 MCG tablet Take one by mouth daily Patient not taking: Reported on 08/25/2023    [provider]  HYDROcodone-acetaminophen (NORCO/VICODIN) 5-325 MG tablet Take 1 tablet by mouth every 6 (six) hours as needed for moderate pain. Patient not taking: Reported on 08/25/2023 03/06/23   Marrion Coy, MD  telmisartan (MICARDIS) 20 MG  tablet Take 1 tablet (20 mg total) by mouth daily. 10/12/22 03/06/23  Chesley Noon, MD    Scheduled Meds:  enoxaparin (LOVENOX) injection  30 mg Subcutaneous Q24H  guaiFENesin  600 mg Oral BID   insulin aspart  0-9 Units Subcutaneous TID WC   ipratropium-albuterol  3 mL Nebulization Q6H   midodrine  10 mg Oral TID WC   sodium chloride flush  3 mL Intravenous Q12H   Continuous Infusions:  cefTRIAXone (ROCEPHIN)  IV Stopped (08/26/23 1610)   metronidazole Stopped (08/26/23 1137)   PRN Meds:.acetaminophen **OR** acetaminophen, chlorpheniramine-HYDROcodone, ondansetron **OR** ondansetron (ZOFRAN) IV  Level 3 follow-up    I spent 50 minutes of dedicated to the care of this patient on the date of this encounter to include pre-visit review of records, face-to-face time with the patient discussing conditions above, post visit ordering of testing, clinical documentation with the electronic health record, making appropriate referrals as documented, and communicating necessary findings to members of the patients care team.  C. Danice Goltz, MD [facility]-    *This note was generated using voice recognition software/Dragon and/or AI transcription program.  Despite best efforts to proofread, errors can occur which can change the meaning. Any transcriptional errors that result from this process are unintentional and may not be fully corrected at the time of dictation.

## 2023-08-27 LAB — GLUCOSE, CAPILLARY
Glucose-Capillary: 104 mg/dL — ABNORMAL HIGH (ref 70–99)
Glucose-Capillary: 108 mg/dL — ABNORMAL HIGH (ref 70–99)
Glucose-Capillary: 109 mg/dL — ABNORMAL HIGH (ref 70–99)
Glucose-Capillary: 112 mg/dL — ABNORMAL HIGH (ref 70–99)
Glucose-Capillary: 141 mg/dL — ABNORMAL HIGH (ref 70–99)

## 2023-08-27 LAB — CBC
HCT: 27.1 % — ABNORMAL LOW (ref 36.0–46.0)
Hemoglobin: 8.8 g/dL — ABNORMAL LOW (ref 12.0–15.0)
MCH: 30.7 pg (ref 26.0–34.0)
MCHC: 32.5 g/dL (ref 30.0–36.0)
MCV: 94.4 fL (ref 80.0–100.0)
Platelets: 129 10*3/uL — ABNORMAL LOW (ref 150–400)
RBC: 2.87 MIL/uL — ABNORMAL LOW (ref 3.87–5.11)
RDW: 13.2 % (ref 11.5–15.5)
WBC: 16.1 10*3/uL — ABNORMAL HIGH (ref 4.0–10.5)
nRBC: 0 % (ref 0.0–0.2)

## 2023-08-27 LAB — BASIC METABOLIC PANEL
Anion gap: 8 (ref 5–15)
BUN: 43 mg/dL — ABNORMAL HIGH (ref 8–23)
CO2: 23 mmol/L (ref 22–32)
Calcium: 8.6 mg/dL — ABNORMAL LOW (ref 8.9–10.3)
Chloride: 105 mmol/L (ref 98–111)
Creatinine, Ser: 1.79 mg/dL — ABNORMAL HIGH (ref 0.44–1.00)
GFR, Estimated: 27 mL/min — ABNORMAL LOW (ref >60)
Glucose, Bld: 112 mg/dL — ABNORMAL HIGH (ref 70–99)
Potassium: 4.5 mmol/L (ref 3.5–5.1)
Sodium: 136 mmol/L (ref 135–145)

## 2023-08-27 LAB — PHOSPHORUS: Phosphorus: 3.5 mg/dL (ref 2.5–4.6)

## 2023-08-27 LAB — MAGNESIUM: Magnesium: 2.3 mg/dL (ref 1.7–2.4)

## 2023-08-27 LAB — LACTIC ACID, PLASMA: Lactic Acid, Venous: 1 mmol/L (ref 0.5–1.9)

## 2023-08-27 MED ORDER — HYDROCORTISONE SOD SUC (PF) 100 MG IJ SOLR
100.0000 mg | Freq: Two times a day (BID) | INTRAMUSCULAR | Status: DC
Start: 2023-08-27 — End: 2023-08-29
  Administered 2023-08-27 – 2023-08-29 (×5): 100 mg via INTRAVENOUS
  Filled 2023-08-27 (×5): qty 2

## 2023-08-27 MED ORDER — CHLORHEXIDINE GLUCONATE CLOTH 2 % EX PADS
6.0000 | MEDICATED_PAD | Freq: Every day | CUTANEOUS | Status: DC
  Administered 2023-08-27: 6 via TOPICAL

## 2023-08-27 MED ORDER — SODIUM CHLORIDE 0.9 % IV SOLN
2.0000 g | INTRAVENOUS | Status: DC
  Administered 2023-08-27: 2 g via INTRAVENOUS
  Filled 2023-08-27 (×2): qty 20

## 2023-08-27 MED ORDER — IPRATROPIUM-ALBUTEROL 0.5-2.5 (3) MG/3ML IN SOLN
3.0000 mL | Freq: Four times a day (QID) | RESPIRATORY_TRACT | Status: DC | PRN
  Administered 2023-09-01: 3 mL via RESPIRATORY_TRACT

## 2023-08-27 MED ORDER — ORAL CARE MOUTH RINSE: 15.0000 mL | OROMUCOSAL | Status: DC | PRN

## 2023-08-27 NOTE — Progress Notes (Signed)
Verbal order to titrate Levo to 1 mcg as needed to meet patient goal os Systolic >100 or Map  >65.

## 2023-08-27 NOTE — Plan of Care (Signed)
  Problem: Education: Goal: Ability to describe self-care measures that may prevent or decrease complications (Diabetes Survival Skills Education) will improve Outcome: Not Progressing   Problem: Clinical Measurements: Goal: Respiratory complications will improve Outcome: Progressing Goal: Cardiovascular complication will be avoided Outcome: Not Progressing

## 2023-08-27 NOTE — Progress Notes (Signed)
Received consult requesting midline for vasopressor order. Discussed policy with RN via secure chat and confirmed no additional line is needed for current medication orders. Consult cleared.

## 2023-08-27 NOTE — Progress Notes (Signed)
NAME:  Audrey Duran, MRN:  [id number], DOB:  1934-11-14, LOS: 2 ADMISSION DATE:  08/24/2023, CONSULTATION DATE: 08/25/2023 REFERRING MD: Gillis Santa, MD, CHIEF COMPLAINT: Acute respiratory failure, possible aspiration pneumonia  History of Present Illness:  Patient is an 89 year old female, remote former smoker, with complex medical history as noted below to include dementia, coronary artery disease, CKD IIIa and cardiomyopathy who presented to [facility] ED on 24 August 2023 due to nausea, vomiting and generalized weakness.  History is obtained from the records and from the patient's bedside nurse.  No family is present during the evaluation and the patient is lethargic and on noninvasive ventilation.  As stated, the patient was transported from her primary care practitioner's office yesterday.  She was noted to be hypotensive and appeared volume depleted when evaluated at her primary physician's office (Dr. Wallene Huh).  She presented there stating that she had had vomiting over the past several days, she also exhibited vomiting during the visit at the primary care physician's office.  She did have 1 episode of near fecal incontinence in the clinic.  She was evaluated by the hospitalist team in the ED the patient was noted to be hypotensive and apparently volume depleted.  She was admitted with a working diagnosis of acute gastroenteritis, hypotension, acute kidney injury superimposed on chronic kidney disease and cough.  Currently the patient cannot endorse any symptomatology due to her lethargy and need for noninvasive ventilation.  This morning the patient was noted to have persistent oxygen requirements at 4 L/min nasal cannula O2 but she developed significant tachypnea with respiratory rate of 44.  She was placed on BiPAP.  There is suspicion of aspiration, chest x-ray obtained this morning shows mild streaky basilar opacities.  Pulmonary has been consulted to assist with management.  The patient is  DNR.  The patient resides at the [facility].  I contacted the patient's daughter and updated her on the above issues.  She did not have much to add with regards to history.  Pertinent  Medical History  Dementia with prior episodes of delirium CKD IIIa Ischemic cardiomyopathy HFpEF 50% (echo 2017) ?  COPD (no FEV1 on record) Paroxysmal atrial fibrillation  Significant Hospital Events: Including procedures, antibiotic start and stop dates in addition to other pertinent events   01/28 admitted with working diagnosis of gastroenteritis, hypotension and volume depletion 01/29 developed respiratory distress, required BiPAP, high risk for aspiration, Pulmonary Medicine consulted, BNP elevated 01/30 marked improvement in mental status weaned to 4 L/min nasal cannula 08/27/23- patient on levophed.  She is alert and is on 5L/min [place].  Starting solucortef today,  I connected with daughter today and reviewed medical plan.   Interim History / Subjective:  Patient is currently with altered mental status not very interactive.  On BiPAP.  Received Lasix IV x 1  Objective   Blood pressure (!) 112/47, pulse 82, temperature 98.3 F (36.8 C), temperature source Oral, resp. rate (!) 31, height 5\' 3"  (1.6 m), weight 59 kg, SpO2 97%.     Intake/Output Summary (Last 24 hours) at 08/27/2023 1006 Last data filed at 08/27/2023 0300 Gross per 24 hour  Intake 219.17 ml  Output 1600 ml  Net -1380.83 ml   Filed Weights   08/24/23 1209 08/26/23 2026 08/27/23 0351  Weight: 59 kg 60.4 kg 59 kg   SpO2: 97 % O2 Flow Rate (L/min): 4 L/min FiO2 (%): (S) 70 %   Examination: GENERAL: Age appropriate NAD HEAD: Normocephalic,  atraumatic.  EYES: Pupils equal, round, reactive to light.  No scleral icterus.  MOUTH: Edentulous, oral mucosa moist. NECK: Supple. No thyromegaly. Trachea midline. No JVD.  No adenopathy. PULMONARY: Good air entry bilaterally.  No rhonchi noted, crackles at the  bases. CARDIOVASCULAR: S1 and S2.  Regular rate and rhythm.  Grade 1/6 systolic ejection murmur the left sternal border. ABDOMEN: Nondistended, diminished bowel sounds but present, soft, nontender. MUSCULOSKELETAL: No joint deformity, no clubbing, no edema.  NEUROLOGIC: No overt focal deficits grossly SKIN: Intact,warm,dry. PSYCH:pleasant   Reviewed imaging  Chest x-ray 1/29  shows cardiomegaly, bibasilar faint infiltrates versus atelectasis:   Chest x-ray today shows cardiomegaly, bilateral pleural effusions right greater than left and bibasilar atelectasis   Assessment & Plan:  Acute respiratory failure with hypoxia, resolving Ddx: Aspiration event versus decompensation of heart failure (BNP elevated) after volume repletion O2 weaned off to 4 L/min nasal cannula Titrate O2 for oxygen saturations at 90% or better Continue Rocephin/flagyl - possible asp pna   Hypovolemic shock due to severe nausea and vomiting Patient was aggressively volume resuscitated Blood pressure stable this morning Appears to be now volume overloaded after initial volume resuscitation KVO fluids Received Lasix x 1, 01/29, urine output has picked up Echocardiogram results pending On low dose pressor support  Acute kidney injury superimposed on CKD IIIa Electrolyte derangements: Hypophosphatemia and hypomagnesemia Creatinine with mild worsening today, BUN improving Avoid nephrotoxic agents Continue to monitor renal function and electrolytes Supplement electrolytes as needed  Acute encephalopathy due to metabolic derangements/acute illness - resolving Underlying dementia Orient patient frequently Correct metabolic derangements Continue O2 for hypoxia  Goals of care Patient is DNR/DNI Multiple admissions over the last year to the hospital Consider palliative care consultation  Best Practice (right click and "Reselect all SmartList Selections" daily)   Diet/type: NPO DVT prophylaxis  LMWH Pressure ulcer(s): pressure ulcer assessment deferred  GI prophylaxis: N/A Lines: N/A Foley:  Yes, and it is still needed Code Status:  DNR Last date of multidisciplinary goals of care discussion [08/25/2023, via phone conversation with patient's daughter Renee]  Consider palliative care consultation.  Labs   CBC: Recent Labs  Lab 08/24/23 1210 08/25/23 0345 08/26/23 0014 08/27/23 0421  WBC 24.2* 16.1* 13.5* 16.1*  NEUTROABS 21.3*  --   --   --   HGB 11.1* 9.4* 9.7* 8.8*  HCT 34.3* 28.9* 29.9* 27.1*  MCV 94.8 94.8 96.1 94.4  PLT 170 125* 122* 129*    Basic Metabolic Panel: Recent Labs  Lab 08/24/23 1210 08/25/23 0345 08/26/23 0014 08/27/23 0421  NA 134* 138 138 136  K 5.8* 4.8 5.0 4.5  CL 99 108 107 105  CO2 20* 21* 18* 23  GLUCOSE 197* 132* 145* 112*  BUN 62* 43* 38* 43*  CREATININE 2.03* 1.67* 2.02* 1.79*  CALCIUM 10.0 9.2 9.1 8.6*  MG  --  1.5* 2.6* 2.3  PHOS  --  2.2* 4.3 3.5   GFR: Estimated Creatinine Clearance: 18.7 mL/min (A) (by C-G formula based on SCr of 1.79 mg/dL (H)). Recent Labs  Lab 08/24/23 1210 08/24/23 1517 08/25/23 0345 08/26/23 0014 08/27/23 0420 08/27/23 0421  WBC 24.2*  --  16.1* 13.5*  --  16.1*  LATICACIDVEN 2.1* 1.6  --   --  1.0  --     Liver Function Tests: Recent Labs  Lab 08/24/23 1210  AST 18  ALT 12  ALKPHOS 49  BILITOT 0.7  PROT 6.8  ALBUMIN 3.4*   Recent Labs  Lab 08/24/23  1210  LIPASE 29   BNP (last 3 results) Recent Labs    08/25/23 0345 08/26/23 0014  BNP 1,008.0* 1,990.3*   ABG    Component Value Date/Time   PHART 7.34 (L) 08/25/2023 0852   PCO2ART 41 08/25/2023 0852   PO2ART 61 (L) 08/25/2023 0852   HCO3 22.1 08/25/2023 0852   ACIDBASEDEF 3.5 (H) 08/25/2023 0852   O2SAT 92.4 08/25/2023 0852     Coagulation Profile: Recent Labs  Lab 08/24/23 1210  INR 1.1    Cardiac Enzymes: No results for input(s): "CKTOTAL", "CKMB", "CKMBINDEX", "TROPONINI" in the last 168  hours.  HbA1C: Hemoglobin A1C  Date/Time Value Ref Range Status  02/17/2012 05:04 AM 5.7 4.2 - 6.3 % Final    Comment:    The American Diabetes Association recommends that a primary goal of therapy should be <7% and that physicians should reevaluate the treatment regimen in patients with HbA1c values consistently >8%.    Hgb A1c MFr Bld  Date/Time Value Ref Range Status  08/26/2023 12:14 AM 5.6 4.8 - 5.6 % Final    Comment:    (NOTE) Pre diabetes:          5.7%-6.4%  Diabetes:              >6.4%  Glycemic control for   <7.0% adults with diabetes   10/02/2022 11:23 AM 5.5 4.6 - 6.5 % Final    Comment:    Glycemic Control Guidelines for People with Diabetes:Non Diabetic:  <6%Goal of Therapy: <7%Additional Action Suggested:  >8%     CBG: Recent Labs  Lab 08/26/23 0819 08/26/23 1213 08/26/23 1633 08/26/23 2026 08/27/23 0747  GLUCAP 116* 130* 98 108* 104*    Review of Systems:   Unable to obtain full review of systems due to the patient's altered mental status and acuity of the situation.  Past Medical History:  She,  has a past medical history of Anemia, Atrial fibrillation (HCC), COPD (chronic obstructive pulmonary disease) (HCC), Coronary artery disease (1995), Hyperlipidemia, Ischemic cardiomyopathy (Dec 2011), Myocardial infarction [facility]), and Recurrent HSV (herpes simplex virus).   Surgical History:   Past Surgical History:  Procedure Laterality Date   ABDOMINAL HYSTERECTOMY  june 2014   BREAST EXCISIONAL BIOPSY Left 1990's   neg   CAROTID ENDARTERECTOMY  July 2007   Dew, right carotid   CATARACT EXTRACTION  2004, 2006   JOINT REPLACEMENT  July 2013   total hip , [place]   LAPAROSCOPIC APPENDECTOMY N/A 02/20/2020   Procedure: APPENDECTOMY LAPAROSCOPIC;  Surgeon: Leafy Ro, MD;  Location: [facility] ORS;  Service: General;  Laterality: N/A;     Social History:   reports that she quit smoking about 31 years ago. Her smoking use included cigarettes. She has  never used smokeless tobacco. She reports that she does not drink alcohol and does not use drugs.   Family History:  Her family history includes Breast cancer (age of onset: 43) in her sister; Cancer (age of onset: 63) in her sister; Cancer (age of onset: 81) in her mother; Heart disease in her father; Multiple sclerosis in her daughter.   Allergies Allergies  Allergen Reactions   Azithromycin     GI upset   Clarithromycin     ? Rash   Diclofenac     ? Rash   Lisinopril Other (See Comments)    hyperkalemia   Meloxicam     ? Rash   Nitrofurantoin Monohyd Macro     ? rash  Zocor [Simvastatin]    Penicillins Rash     Home Medications  Prior to Admission medications   Medication Sig Start Date End Date Taking? Authorizing Provider  acetaminophen (TYLENOL) 325 MG tablet Take by mouth every 4 (four) hours as needed.   Yes [provider]  acyclovir (ZOVIRAX) 400 MG tablet Take 400 mg by mouth daily. 02/09/23  Yes [provider]  alendronate (FOSAMAX) 70 MG tablet Take 1 tablet by mouth every 7 days on an empty stomach with a full glass of water 04/27/22  Yes Sherlene Shams, MD  alum & mag hydroxide-simeth (MAALOX/MYLANTA) 200-200-20 MG/5ML suspension Take 30 mLs by mouth every 6 (six) hours as needed for indigestion or heartburn.   Yes [provider]  aspirin 81 MG tablet Take 81 mg by mouth at bedtime.   Yes [provider]  Cholecalciferol (VITAMIN D3) 75 MCG (3000 UT) TABS Take 1 tablet by mouth daily. Take one by mouth daily 04/27/22  Yes Sherlene Shams, MD  citalopram (CELEXA) 20 MG tablet Take 1 tablet (20 mg total) by mouth daily. 01/11/23  Yes Sherlene Shams, MD  diphenhydrAMINE (BENADRYL) 25 mg capsule Take 25 mg by mouth every 6 (six) hours as needed.   Yes [provider]  furosemide (LASIX) 20 MG tablet Take 20 mg by mouth daily. 07/19/23  Yes [provider]  guaifenesin (ROBITUSSIN) 100 MG/5ML syrup Take 300 mg by  mouth 4 (four) times daily as needed for cough.   Yes [provider]  lidocaine (XYLOCAINE) 5 % ointment Apply 1 Application topically as needed. 03/06/23  Yes Marrion Coy, MD  lisinopril (ZESTRIL) 5 MG tablet Take 5 mg by mouth daily.   Yes [provider]  loperamide (IMODIUM) 2 MG capsule Take 4 mg by mouth as needed for diarrhea or loose stools.   Yes [provider]  LORazepam (ATIVAN) 0.5 MG tablet Take 0.5 mg by mouth every 6 (six) hours as needed for anxiety. 05/28/23  Yes [provider]  magnesium hydroxide (MILK OF MAGNESIA) 400 MG/5ML suspension Take 30 mLs by mouth daily as needed for mild constipation.   Yes [provider]  magnesium oxide (MAG-OX) 400 MG tablet Take 400 mg by mouth daily.   Yes [provider]  mirtazapine (REMERON) 7.5 MG tablet Take 1 tablet (7.5 mg total) by mouth at bedtime. 10/12/22 08/24/23 Yes Chesley Noon, MD  risperiDONE (RISPERDAL) 0.25 MG tablet Take 1 tablet (0.25 mg total) by mouth at bedtime. Patient taking differently: Take 0.25 mg by mouth 2 (two) times daily. 10/12/22 08/25/23 Yes Chesley Noon, MD  rosuvastatin (CRESTOR) 20 MG tablet Take 1 tablet (20 mg total) by mouth daily. Patient taking differently: Take 20 mg by mouth at bedtime. 04/27/22  Yes Sherlene Shams, MD  timolol (TIMOPTIC) 0.5 % ophthalmic solution Place 1 drop into both eyes 2 (two) times daily. 02/24/23  Yes [provider]  ALPRAZolam Prudy Feeler) 0.5 MG tablet Take 0.5 mg by mouth daily. Patient not taking: Reported on 08/25/2023 01/12/23   [provider]  cyanocobalamin 1000 MCG tablet Take one by mouth daily Patient not taking: Reported on 08/25/2023    [provider]  HYDROcodone-acetaminophen (NORCO/VICODIN) 5-325 MG tablet Take 1 tablet by mouth every 6 (six) hours as needed for moderate pain. Patient not taking: Reported on 08/25/2023 03/06/23   Marrion Coy, MD  telmisartan (MICARDIS) 20 MG tablet  Take 1 tablet (20 mg total) by mouth daily. 10/12/22 03/06/23  Chesley Noon, MD    Scheduled Meds:  Chlorhexidine Gluconate Cloth  6 each Topical Daily   enoxaparin (LOVENOX) injection  30 mg Subcutaneous Q24H   guaiFENesin  600 mg Oral BID   insulin aspart  0-9 Units Subcutaneous TID WC   ipratropium-albuterol  3 mL Nebulization Q6H   midodrine  10 mg Oral TID WC   sodium chloride flush  3 mL Intravenous Q12H   Continuous Infusions:  sodium chloride Stopped (08/26/23 1357)   cefTRIAXone (ROCEPHIN)  IV 1 g (08/27/23 0819)   metronidazole Stopped (08/26/23 2100)   norepinephrine (LEVOPHED) Adult infusion 4 mcg/min (08/27/23 0616)   PRN Meds:.acetaminophen **OR** acetaminophen, chlorpheniramine-HYDROcodone, ondansetron **OR** ondansetron (ZOFRAN) IV, mouth rinse  Critical care provider statement:   Total critical care time: 33 minutes   Performed by: Karna Christmas MD   Critical care time was exclusive of separately billable procedures and treating other patients.   Critical care was necessary to treat or prevent imminent or life-threatening deterioration.   Critical care was time spent personally by me on the following activities: development of treatment plan with patient and/or surrogate as well as nursing, discussions with consultants, evaluation of patient's response to treatment, examination of patient, obtaining history from patient or surrogate, ordering and performing treatments and interventions, ordering and review of laboratory studies, ordering and review of radiographic studies, pulse oximetry and re-evaluation of patient's condition.    Vida Rigger, M.D.  Pulmonary & Critical Care Medicine       *This note was generated using voice recognition software/Dragon and/or AI transcription program.  Despite best efforts to proofread, errors can occur which can change the meaning. Any transcriptional errors that result from this process are unintentional and may not be  fully corrected at the time of dictation.

## 2023-08-27 NOTE — Evaluation (Signed)
Physical Therapy Evaluation Patient Details Name: Audrey Duran MRN: [id number] DOB: 03/24/1935 Today's Date: 08/27/2023  History of Present Illness  Patient is a 89 year old female with weakness, vomiting, diarrhea, acute hypoxic respiratory failure, pneumonia, possible aspiration. Hypotension requiring pressor support. History of T11 fracture, dementia COPD, CAD, CHF, HTN, CKD, diabetes mellitus.   Clinical Impression  Patient is agreeable to PT evaluation. She is confused but cooperative with assessment and able to follow single step commands consistently with increased time. She is from ALF per notes, possibly in the memory care unit per report. She is ambulatory at baseline.  Today the patient required Min A for bed mobility, for standing, and for taking side steps. Ambulation deferred due to MAP of 61 while sitting (no dizziness is reported) and Sp02 down to 86-89% on 5 L02 with activity. The patient reports she feels "fine" and enjoyed sitting up. Hopefully the patient will be able to return to familiar environment at ALF with intermittent staff assistance and physical therapy. PT will continue to follow while in the hospital to maximize independence and facilitate return to prior level of function.       If plan is discharge home, recommend the following: A little help with walking and/or transfers;A little help with bathing/dressing/bathroom;Supervision due to cognitive status;Help with stairs or ramp for entrance;Direct supervision/assist for medications management;Direct supervision/assist for financial management;Assist for transportation   Can travel by private vehicle        Equipment Recommendations Other (comment) (to be determined at next level of care)  Recommendations for Other Services       Functional Status Assessment Patient has had a recent decline in their functional status and demonstrates the ability to make significant improvements in function in a reasonable  and predictable amount of time.     Precautions / Restrictions Precautions Precautions: Fall Precaution Comments: BP, Sp02 Restrictions Weight Bearing Restrictions Per Provider Order: No      Mobility  Bed Mobility Overal bed mobility: Needs Assistance Bed Mobility: Supine to Sit, Sit to Supine     Supine to sit: Supervision, HOB elevated Sit to supine: Min assist   General bed mobility comments: intermittent assistance for LE support to return to bed due to tall hospital bed    Transfers Overall transfer level: Needs assistance Equipment used: 1 person hand held assist Transfers: Sit to/from Stand Sit to Stand: Min assist           General transfer comment: lifting assistance required for standing    Ambulation/Gait             Pre-gait activities: patient able to take 2 small side steps to the left with Min A for steadying. further walking deferred becuase of blood pressure (MAP of 61) and Sp02 decreasing to 86-89%.    Stairs            Wheelchair Mobility     Tilt Bed    Modified Rankin (Stroke Patients Only)       Balance Overall balance assessment: Needs assistance Sitting-balance support: Feet supported Sitting balance-Leahy Scale: Fair     Standing balance support: Single extremity supported Standing balance-Leahy Scale: Poor Standing balance comment: external support required                             Pertinent Vitals/Pain Pain Assessment Pain Assessment: No/denies pain    Home Living Family/patient expects to be discharged to:: Assisted living  Additional Comments: [facility] (memory care per bedside nurse)    Prior Function Prior Level of Function : Needs assist             Mobility Comments: ambulated without AD ADLs Comments: medication management assistance and presumably with meals. patient reports independent with dressing and bathing      Extremity/Trunk Assessment   Upper Extremity Assessment Upper Extremity Assessment: Generalized weakness    Lower Extremity Assessment Lower Extremity Assessment: Generalized weakness       Communication   Communication Communication: Difficulty following commands/understanding Following commands: Follows one step commands with increased time Cueing Techniques: Verbal cues  Cognition Arousal: Alert Behavior During Therapy: WFL for tasks assessed/performed Overall Cognitive Status: History of cognitive impairments - at baseline                                 General Comments: patient is oriented to self and birthdate, otherwise disoriented. she has decreased awareness of deficits (asking if she can resume driving). she is able to follow single step commands with increased time        General Comments General comments (skin integrity, edema, etc.): patient on 5 L02 during session. MAP of 105 in supine, 61 x 2 while sitting, and 63 once back to bed. No dizziness is reported with activity today    Exercises     Assessment/Plan    PT Assessment Patient needs continued PT services  PT Problem List Decreased strength;Decreased activity tolerance;Decreased balance;Decreased mobility;Decreased safety awareness;Decreased cognition       PT Treatment Interventions DME instruction;Gait training;Stair training;Functional mobility training;Therapeutic activities;Therapeutic exercise;Balance training;Neuromuscular re-education;Cognitive remediation;Patient/family education    PT Goals (Current goals can be found in the Care Plan section)  Acute Rehab PT Goals Patient Stated Goal: to go home PT Goal Formulation: With patient Time For Goal Achievement: 09/10/23 Potential to Achieve Goals: Fair    Frequency Min 1X/week     Co-evaluation               AM-PAC PT "6 Clicks" Mobility  Outcome Measure Help needed turning from your back to your side while in a  flat bed without using bedrails?: None Help needed moving from lying on your back to sitting on the side of a flat bed without using bedrails?: A Little Help needed moving to and from a bed to a chair (including a wheelchair)?: A Little Help needed standing up from a chair using your arms (e.g., wheelchair or bedside chair)?: A Little Help needed to walk in hospital room?: A Little Help needed climbing 3-5 steps with a railing? : A Lot 6 Click Score: 18    End of Session Equipment Utilized During Treatment: Oxygen Activity Tolerance: Patient tolerated treatment well;Patient limited by fatigue Patient left: in bed;with call bell/phone within reach;with bed alarm set Nurse Communication: Mobility status PT Visit Diagnosis: Muscle weakness (generalized) (M62.81);Unsteadiness on feet (R26.81)    Time: 1610-9604 PT Time Calculation (min) (ACUTE ONLY): 20 min   Charges:   PT Evaluation $PT Eval High Complexity: 1 High PT Treatments $Therapeutic Activity: 8-22 mins PT General Charges $$ ACUTE PT VISIT: 1 Visit         Donna Bernard, PT, MPT   Ina Homes 08/27/2023, 10:59 AM

## 2023-08-27 NOTE — Evaluation (Signed)
Occupational Therapy Evaluation Patient Details Name: LAINEE LEHRMAN MRN: [id number] DOB: Jul 07, 1935 Today's Date: 08/27/2023   History of Present Illness Patient is a 89 year old female with weakness, vomiting, diarrhea, acute hypoxic respiratory failure, pneumonia, possible aspiration. Hypotension requiring pressor support. History of T11 fracture, dementia COPD, CAD, CHF, HTN, CKD, diabetes mellitus.   Clinical Impression   Chart reviewed to date, pt greeted in bed, agreeable to OT evaluation. Pt is oriented to self, increased time for one step direction following but pleasant and agreeable throughout. Pt is a poor historian, but per chart lives in ALF. Pt reports she gets dressed and bathes by herself, amb with no AD. Presumably pt has assist for IADLs. Will need to confirm. Pt presents with deficits in strength, endurance, activity tolerance, balance, cognition, affecting safe and optimal ADL completion. Bed mobility completed with supervision, STS with MIN A, short amb transfers with CGA-MIN A with RW, frequent multi modal cues for technique. SET UP for feeding tasks in chair. Pt is left in bedside chair, all needs met. OT will continue to follow.       If plan is discharge home, recommend the following: A little help with walking and/or transfers;A little help with bathing/dressing/bathroom;Direct supervision/assist for medications management;Assist for transportation;Help with stairs or ramp for entrance;Direct supervision/assist for financial management;Assistance with cooking/housework;Supervision due to cognitive status    Functional Status Assessment  Patient has had a recent decline in their functional status and demonstrates the ability to make significant improvements in function in a reasonable and predictable amount of time.  Equipment Recommendations  BSC/3in1    Recommendations for Other Services       Precautions / Restrictions Precautions Precautions:  Fall Precaution Comments: watch BP, spo2 Restrictions Weight Bearing Restrictions Per Provider Order: No      Mobility Bed Mobility Overal bed mobility: Needs Assistance Bed Mobility: Supine to Sit, Sit to Supine     Supine to sit: Supervision, HOB elevated          Transfers Overall transfer level: Needs assistance Equipment used: Rolling walker (2 wheels) Transfers: Sit to/from Stand Sit to Stand: Contact guard assist, Min assist                  Balance Overall balance assessment: Needs assistance Sitting-balance support: Feet supported Sitting balance-Leahy Scale: Good     Standing balance support: Bilateral upper extremity supported, During functional activity, Reliant on assistive device for balance Standing balance-Leahy Scale: Fair                             ADL either performed or assessed with clinical judgement   ADL Overall ADL's : Needs assistance/impaired Eating/Feeding: Set up;Sitting Eating/Feeding Details (indicate cue type and reason): pt cuts food served to her, feed herself after set up from dining staff Grooming: Sitting;Set up               Lower Body Dressing: Maximal assistance;Sitting/lateral leans Lower Body Dressing Details (indicate cue type and reason): socks Toilet Transfer: Contact guard assist;Minimal assistance;Rolling walker (2 wheels) Toilet Transfer Details (indicate cue type and reason): simulated, short amb transsfer to recliner, intermittent vcs for technique Toileting- Clothing Manipulation and Hygiene: Maximal assistance;Sit to/from stand               Vision Patient Visual Report: No change from baseline       Perception         Praxis  Pertinent Vitals/Pain Pain Assessment Pain Assessment: No/denies pain     Extremity/Trunk Assessment Upper Extremity Assessment Upper Extremity Assessment: Generalized weakness   Lower Extremity Assessment Lower Extremity Assessment:  Generalized weakness   Cervical / Trunk Assessment Cervical / Trunk Assessment: Normal   Communication Communication Communication: Difficulty following commands/understanding Following commands: Follows one step commands with increased time Cueing Techniques: Verbal cues   Cognition Arousal: Alert Behavior During Therapy: WFL for tasks assessed/performed Overall Cognitive Status: Impaired/Different from baseline Area of Impairment: Orientation, Attention, Memory, Following commands, Safety/judgement, Awareness                 Orientation Level: Disoriented to, Time, Situation Current Attention Level: Focused Memory: Decreased short-term memory, Decreased recall of precautions Following Commands: Follows one step commands with increased time Safety/Judgement: Decreased awareness of deficits, Decreased awareness of safety Awareness: Intellectual         General Comments  BP monitored throughout, MAP >65 throughout; Pt on 5 L via [place] throughout with spo2 >88% throughout    Exercises Other Exercises Other Exercises: edu re: role of OT, role of rehab, discharge recommendations   Shoulder Instructions      Home Living Family/patient expects to be discharged to:: Unsure                                 Additional Comments: per chart pt lives in ALF      Prior Functioning/Environment Prior Level of Function : Needs assist;Patient poor historian/Family not available             Mobility Comments: pt reports she amb with no AD ADLs Comments: pt reports she performs dressing, feeding, grooming, bathing (standing in shower with no supervision) will need to confirm        OT Problem List: Decreased activity tolerance;Impaired balance (sitting and/or standing);Decreased knowledge of use of DME or AE;Decreased cognition;Decreased safety awareness;Decreased strength      OT Treatment/Interventions: Self-care/ADL training;DME and/or AE  instruction;Therapeutic activities;Balance training;Therapeutic exercise;Patient/family education;Cognitive remediation/compensation    OT Goals(Current goals can be found in the care plan section) Acute Rehab OT Goals Patient Stated Goal: get stronger OT Goal Formulation: With patient Time For Goal Achievement: 09/10/23 Potential to Achieve Goals: Good ADL Goals Pt Will Perform Grooming: with supervision;sitting;standing Pt Will Perform Lower Body Dressing: with supervision;sitting/lateral leans;sit to/from stand Pt Will Transfer to Toilet: with supervision;ambulating Pt Will Perform Toileting - Clothing Manipulation and hygiene: with supervision;sit to/from stand;sitting/lateral leans  OT Frequency: Min 1X/week    Co-evaluation              AM-PAC OT "6 Clicks" Daily Activity     Outcome Measure Help from another person eating meals?: None Help from another person taking care of personal grooming?: None Help from another person toileting, which includes using toliet, bedpan, or urinal?: A Little Help from another person bathing (including washing, rinsing, drying)?: A Lot Help from another person to put on and taking off regular upper body clothing?: A Little Help from another person to put on and taking off regular lower body clothing?: A Lot 6 Click Score: 18   End of Session Equipment Utilized During Treatment: Gait belt;Rolling walker (2 wheels);Oxygen Nurse Communication: Mobility status  Activity Tolerance: Patient tolerated treatment well Patient left: in chair;with call bell/phone within reach;with chair alarm set  OT Visit Diagnosis: Other abnormalities of gait and mobility (R26.89);Muscle weakness (generalized) (M62.81)  Time: 1610-9604 OT Time Calculation (min): 21 min Charges:  OT General Charges $OT Visit: 1 Visit OT Evaluation $OT Eval High Complexity: 1 High  Oleta Mouse, OTD OTR/L  08/27/23, 1:06 PM

## 2023-08-27 NOTE — Progress Notes (Signed)
PHARMACY CONSULT NOTE - FOLLOW UP  Pharmacy Consult for Electrolyte Monitoring and Replacement   Recent Labs: Potassium (mmol/L)  Date Value  08/27/2023 4.5  02/01/2013 3.6   Magnesium (mg/dL)  Date Value  16/04/9603 2.3  02/17/2012 1.5 (L)   Calcium (mg/dL)  Date Value  54/03/8118 8.6 (L)   Calcium, Total (mg/dL)  Date Value  14/78/2956 9.3   Albumin (g/dL)  Date Value  21/30/8657 3.4 (L)  09/21/2012 3.2 (L)   Phosphorus (mg/dL)  Date Value  84/69/6295 3.5   Sodium (mmol/L)  Date Value  08/27/2023 136  08/09/2014 139  02/01/2013 137     Assessment: 89 year old female, remote former smoker, with complex medical history as noted below to include dementia, coronary artery disease, CKD IIIa and cardiomyopathy who presented to [facility] ED on 24 August 2023 due to nausea, vomiting and generalized weakness. Pharmacy is asked to follow and replace electrolytes while in CCU  Goal of Therapy:  Electrolytes WNL  Plan:  ---no electrolyte replacement warranted for today ---recheck electrolytes in am  Lowella Bandy ,PharmD Clinical Pharmacist 08/27/2023 7:05 AM

## 2023-08-28 LAB — CBC
HCT: 26.7 % — ABNORMAL LOW (ref 36.0–46.0)
Hemoglobin: 8.7 g/dL — ABNORMAL LOW (ref 12.0–15.0)
MCH: 30.7 pg (ref 26.0–34.0)
MCHC: 32.6 g/dL (ref 30.0–36.0)
MCV: 94.3 fL (ref 80.0–100.0)
Platelets: 133 10*3/uL — ABNORMAL LOW (ref 150–400)
RBC: 2.83 MIL/uL — ABNORMAL LOW (ref 3.87–5.11)
RDW: 13.2 % (ref 11.5–15.5)
WBC: 13.3 10*3/uL — ABNORMAL HIGH (ref 4.0–10.5)
nRBC: 0 % (ref 0.0–0.2)

## 2023-08-28 LAB — BASIC METABOLIC PANEL
Anion gap: 11 (ref 5–15)
BUN: 55 mg/dL — ABNORMAL HIGH (ref 8–23)
CO2: 22 mmol/L (ref 22–32)
Calcium: 8.7 mg/dL — ABNORMAL LOW (ref 8.9–10.3)
Chloride: 103 mmol/L (ref 98–111)
Creatinine, Ser: 1.72 mg/dL — ABNORMAL HIGH (ref 0.44–1.00)
GFR, Estimated: 29 mL/min — ABNORMAL LOW (ref >60)
Glucose, Bld: 110 mg/dL — ABNORMAL HIGH (ref 70–99)
Potassium: 4.7 mmol/L (ref 3.5–5.1)
Sodium: 136 mmol/L (ref 135–145)

## 2023-08-28 LAB — GLUCOSE, CAPILLARY
Glucose-Capillary: 158 mg/dL — ABNORMAL HIGH (ref 70–99)
Glucose-Capillary: 115 mg/dL — ABNORMAL HIGH (ref 70–99)

## 2023-08-28 LAB — MAGNESIUM: Magnesium: 2.4 mg/dL (ref 1.7–2.4)

## 2023-08-28 LAB — PHOSPHORUS: Phosphorus: 3.7 mg/dL (ref 2.5–4.6)

## 2023-08-28 MED ORDER — METRONIDAZOLE 500 MG PO TABS
500.0000 mg | ORAL_TABLET | Freq: Two times a day (BID) | ORAL | Status: AC
  Administered 2023-08-28 – 2023-08-29 (×3): 500 mg via ORAL
  Filled 2023-08-28 (×3): qty 1

## 2023-08-28 MED ORDER — CEFUROXIME AXETIL 250 MG PO TABS
250.0000 mg | ORAL_TABLET | Freq: Two times a day (BID) | ORAL | Status: AC
  Administered 2023-08-28 – 2023-08-29 (×3): 250 mg via ORAL
  Filled 2023-08-28 (×3): qty 1

## 2023-08-28 NOTE — Progress Notes (Addendum)
NAME:  BUNA CUPPETT, MRN:  [id number], DOB:  1934-08-13, LOS: 3 ADMISSION DATE:  08/24/2023, CONSULTATION DATE: 08/25/2023 REFERRING MD: Gillis Santa, MD, CHIEF COMPLAINT: Acute respiratory failure, possible aspiration pneumonia  History of Present Illness:  Patient is an 89 year old female, remote former smoker, with complex medical history as noted below to include dementia, coronary artery disease, CKD IIIa and cardiomyopathy who presented to [facility] ED on 24 August 2023 due to nausea, vomiting and generalized weakness.  History is obtained from the records and from the patient's bedside nurse.  No family is present during the evaluation and the patient is lethargic and on noninvasive ventilation.  As stated, the patient was transported from her primary care practitioner's office yesterday.  She was noted to be hypotensive and appeared volume depleted when evaluated at her primary physician's office (Dr. Wallene Huh).  She presented there stating that she had had vomiting over the past several days, she also exhibited vomiting during the visit at the primary care physician's office.  She did have 1 episode of near fecal incontinence in the clinic.  She was evaluated by the hospitalist team in the ED the patient was noted to be hypotensive and apparently volume depleted.  She was admitted with a working diagnosis of acute gastroenteritis, hypotension, acute kidney injury superimposed on chronic kidney disease and cough.  Currently the patient cannot endorse any symptomatology due to her lethargy and need for noninvasive ventilation.  This morning the patient was noted to have persistent oxygen requirements at 4 L/min nasal cannula O2 but she developed significant tachypnea with respiratory rate of 44.  She was placed on BiPAP.  There is suspicion of aspiration, chest x-ray obtained this morning shows mild streaky basilar opacities.  Pulmonary has been consulted to assist with management.  The patient is  DNR.  The patient resides at the [facility].  I contacted the patient's daughter and updated her on the above issues.  She did not have much to add with regards to history.  Pertinent  Medical History  Dementia with prior episodes of delirium CKD IIIa Ischemic cardiomyopathy HFpEF 50% (echo 2017) ?  COPD (no FEV1 on record) Paroxysmal atrial fibrillation  Significant Hospital Events: Including procedures, antibiotic start and stop dates in addition to other pertinent events   01/28 admitted with working diagnosis of gastroenteritis, hypotension and volume depletion 01/29 developed respiratory distress, required BiPAP, high risk for aspiration, Pulmonary Medicine consulted, BNP elevated 01/30 marked improvement in mental status weaned to 4 L/min nasal cannula 08/27/23- patient on levophed.  She is alert and is on 5L/min [place].  Starting solucortef today,  I connected with daughter today and reviewed medical plan.  08/28/23- patient is clinically improved, off vasopressor support.  Her labwork shows downtrending leukocytosis with stable anemia, bmp with improved AKI.  O2 req down to 2L/min and mental status pleasant but baseline mild cognitive impairment with early stages of dementia.   Interim History / Subjective:  Patient is currently with altered mental status not very interactive.  On BiPAP.  Received Lasix IV x 1  Objective   Blood pressure (!) 142/61, pulse 67, temperature 98.7 F (37.1 C), temperature source Oral, resp. rate 18, height 5\' 3"  (1.6 m), weight 59 kg, SpO2 96%.     Intake/Output Summary (Last 24 hours) at 08/28/2023 0837 Last data filed at 08/28/2023 0615 Gross per 24 hour  Intake 568.19 ml  Output 1100 ml  Net -531.81 ml   [employer]  08/26/23 2026 08/27/23 0351 08/28/23 0500  Weight: 60.4 kg 59 kg 59 kg   SpO2: 96 % O2 Flow Rate (L/min): 2 L/min FiO2 (%): (S) 70 %   Examination: GENERAL: Age appropriate NAD HEAD: Normocephalic,  atraumatic.  EYES: Pupils equal, round, reactive to light.  No scleral icterus.  MOUTH: Edentulous, oral mucosa moist. NECK: Supple. No thyromegaly. Trachea midline. No JVD.  No adenopathy. PULMONARY: Good air entry bilaterally.  No rhonchi noted, crackles at the bases. CARDIOVASCULAR: S1 and S2.  Regular rate and rhythm.  Grade 1/6 systolic ejection murmur the left sternal border. ABDOMEN: Nondistended, diminished bowel sounds but present, soft, nontender. MUSCULOSKELETAL: No joint deformity, no clubbing, no edema.  NEUROLOGIC: No overt focal deficits grossly SKIN: Intact,warm,dry. PSYCH:pleasant   Reviewed imaging  Chest x-ray 1/29  shows cardiomegaly, bibasilar faint infiltrates versus atelectasis:   Chest x-ray today shows cardiomegaly, bilateral pleural effusions right greater than left and bibasilar atelectasis   Assessment & Plan:  Acute respiratory failure with hypoxia, resolving Ddx: Aspiration event versus decompensation of heart failure (BNP elevated) after volume repletion O2 weaned off to 2 L/min nasal cannula Titrate O2 for oxygen saturations at 90% or better Change to PO abx to complete Asp PNA tx -pt is PCN allergic -ceftin/flagyl po x 5 more days   Hypovolemic shock -HAS RESOLVED    Acute kidney injury superimposed on CKD IIIa Electrolyte derangements: Hypophosphatemia and hypomagnesemia Creatinine improved Avoid nephrotoxic agents Continue to monitor renal function and electrolytes Supplement electrolytes as needed   dementia Orient patient frequently Correct metabolic derangements Continue O2 for hypoxia  Goals of care Patient is DNR/DNI Multiple admissions over the last year to the hospital Consider palliative care consultation  Best Practice (right click and "Reselect all SmartList Selections" daily)   Diet/type: NPO DVT prophylaxis LMWH Pressure ulcer(s): pressure ulcer assessment deferred  GI prophylaxis: N/A Lines: N/A Foley:  Yes, and it  is still needed Code Status:  DNR Last date of multidisciplinary goals of care discussion [08/25/2023, via phone conversation with patient's daughter Renee]  Consider palliative care consultation.  Labs   CBC: Recent Labs  Lab 08/24/23 1210 08/25/23 0345 08/26/23 0014 08/27/23 0421 08/28/23 0356  WBC 24.2* 16.1* 13.5* 16.1* 13.3*  NEUTROABS 21.3*  --   --   --   --   HGB 11.1* 9.4* 9.7* 8.8* 8.7*  HCT 34.3* 28.9* 29.9* 27.1* 26.7*  MCV 94.8 94.8 96.1 94.4 94.3  PLT 170 125* 122* 129* 133*    Basic Metabolic Panel: Recent Labs  Lab 08/24/23 1210 08/25/23 0345 08/26/23 0014 08/27/23 0421 08/28/23 0356  NA 134* 138 138 136 136  K 5.8* 4.8 5.0 4.5 4.7  CL 99 108 107 105 103  CO2 20* 21* 18* 23 22  GLUCOSE 197* 132* 145* 112* 110*  BUN 62* 43* 38* 43* 55*  CREATININE 2.03* 1.67* 2.02* 1.79* 1.72*  CALCIUM 10.0 9.2 9.1 8.6* 8.7*  MG  --  1.5* 2.6* 2.3 2.4  PHOS  --  2.2* 4.3 3.5 3.7   GFR: Estimated Creatinine Clearance: 19.4 mL/min (A) (by C-G formula based on SCr of 1.72 mg/dL (H)). Recent Labs  Lab 08/24/23 1210 08/24/23 1517 08/25/23 0345 08/26/23 0014 08/27/23 0420 08/27/23 0421 08/28/23 0356  WBC 24.2*  --  16.1* 13.5*  --  16.1* 13.3*  LATICACIDVEN 2.1* 1.6  --   --  1.0  --   --     Liver Function Tests: Recent Labs  Lab 08/24/23 1210  AST 18  ALT 12  ALKPHOS 49  BILITOT 0.7  PROT 6.8  ALBUMIN 3.4*   Recent Labs  Lab 08/24/23 1210  LIPASE 29   BNP (last 3 results) Recent Labs    08/25/23 0345 08/26/23 0014  BNP 1,008.0* 1,990.3*   ABG    Component Value Date/Time   PHART 7.34 (L) 08/25/2023 0852   PCO2ART 41 08/25/2023 0852   PO2ART 61 (L) 08/25/2023 0852   HCO3 22.1 08/25/2023 0852   ACIDBASEDEF 3.5 (H) 08/25/2023 0852   O2SAT 92.4 08/25/2023 0852     Coagulation Profile: Recent Labs  Lab 08/24/23 1210  INR 1.1    Cardiac Enzymes: No results for input(s): "CKTOTAL", "CKMB", "CKMBINDEX", "TROPONINI" in the last 168  hours.  HbA1C: Hemoglobin A1C  Date/Time Value Ref Range Status  02/17/2012 05:04 AM 5.7 4.2 - 6.3 % Final    Comment:    The American Diabetes Association recommends that a primary goal of therapy should be <7% and that physicians should reevaluate the treatment regimen in patients with HbA1c values consistently >8%.    Hgb A1c MFr Bld  Date/Time Value Ref Range Status  08/26/2023 12:14 AM 5.6 4.8 - 5.6 % Final    Comment:    (NOTE) Pre diabetes:          5.7%-6.4%  Diabetes:              >6.4%  Glycemic control for   <7.0% adults with diabetes   10/02/2022 11:23 AM 5.5 4.6 - 6.5 % Final    Comment:    Glycemic Control Guidelines for People with Diabetes:Non Diabetic:  <6%Goal of Therapy: <7%Additional Action Suggested:  >8%     CBG: Recent Labs  Lab 08/26/23 2026 08/27/23 0747 08/27/23 1101 08/27/23 1543 08/27/23 2337  GLUCAP 108* 104* 112* 141* 109*    Review of Systems:   Unable to obtain full review of systems due to the patient's altered mental status and acuity of the situation.  Past Medical History:  She,  has a past medical history of Anemia, Atrial fibrillation (HCC), COPD (chronic obstructive pulmonary disease) (HCC), Coronary artery disease (1995), Hyperlipidemia, Ischemic cardiomyopathy (Dec 2011), Myocardial infarction [facility]), and Recurrent HSV (herpes simplex virus).   Surgical History:   Past Surgical History:  Procedure Laterality Date   ABDOMINAL HYSTERECTOMY  june 2014   BREAST EXCISIONAL BIOPSY Left 1990's   neg   CAROTID ENDARTERECTOMY  July 2007   Dew, right carotid   CATARACT EXTRACTION  2004, 2006   JOINT REPLACEMENT  July 2013   total hip , [place]   LAPAROSCOPIC APPENDECTOMY N/A 02/20/2020   Procedure: APPENDECTOMY LAPAROSCOPIC;  Surgeon: Leafy Ro, MD;  Location: [facility] ORS;  Service: General;  Laterality: N/A;     Social History:   reports that she quit smoking about 31 years ago. Her smoking use included cigarettes. She has  never used smokeless tobacco. She reports that she does not drink alcohol and does not use drugs.   Family History:  Her family history includes Breast cancer (age of onset: 31) in her sister; Cancer (age of onset: 7) in her sister; Cancer (age of onset: 94) in her mother; Heart disease in her father; Multiple sclerosis in her daughter.   Allergies Allergies  Allergen Reactions   Azithromycin     GI upset   Clarithromycin     ? Rash   Diclofenac     ? Rash   Lisinopril Other (See Comments)  hyperkalemia   Meloxicam     ? Rash   Nitrofurantoin Monohyd Macro     ? rash   Zocor [Simvastatin]    Penicillins Rash     Home Medications  Prior to Admission medications   Medication Sig Start Date End Date Taking? Authorizing Provider  acetaminophen (TYLENOL) 325 MG tablet Take by mouth every 4 (four) hours as needed.   Yes [provider]  acyclovir (ZOVIRAX) 400 MG tablet Take 400 mg by mouth daily. 02/09/23  Yes [provider]  alendronate (FOSAMAX) 70 MG tablet Take 1 tablet by mouth every 7 days on an empty stomach with a full glass of water 04/27/22  Yes Sherlene Shams, MD  alum & mag hydroxide-simeth (MAALOX/MYLANTA) 200-200-20 MG/5ML suspension Take 30 mLs by mouth every 6 (six) hours as needed for indigestion or heartburn.   Yes [provider]  aspirin 81 MG tablet Take 81 mg by mouth at bedtime.   Yes [provider]  Cholecalciferol (VITAMIN D3) 75 MCG (3000 UT) TABS Take 1 tablet by mouth daily. Take one by mouth daily 04/27/22  Yes Sherlene Shams, MD  citalopram (CELEXA) 20 MG tablet Take 1 tablet (20 mg total) by mouth daily. 01/11/23  Yes Sherlene Shams, MD  diphenhydrAMINE (BENADRYL) 25 mg capsule Take 25 mg by mouth every 6 (six) hours as needed.   Yes [provider]  furosemide (LASIX) 20 MG tablet Take 20 mg by mouth daily. 07/19/23  Yes [provider]  guaifenesin (ROBITUSSIN) 100 MG/5ML syrup Take 300 mg by  mouth 4 (four) times daily as needed for cough.   Yes [provider]  lidocaine (XYLOCAINE) 5 % ointment Apply 1 Application topically as needed. 03/06/23  Yes Marrion Coy, MD  lisinopril (ZESTRIL) 5 MG tablet Take 5 mg by mouth daily.   Yes [provider]  loperamide (IMODIUM) 2 MG capsule Take 4 mg by mouth as needed for diarrhea or loose stools.   Yes [provider]  LORazepam (ATIVAN) 0.5 MG tablet Take 0.5 mg by mouth every 6 (six) hours as needed for anxiety. 05/28/23  Yes [provider]  magnesium hydroxide (MILK OF MAGNESIA) 400 MG/5ML suspension Take 30 mLs by mouth daily as needed for mild constipation.   Yes [provider]  magnesium oxide (MAG-OX) 400 MG tablet Take 400 mg by mouth daily.   Yes [provider]  mirtazapine (REMERON) 7.5 MG tablet Take 1 tablet (7.5 mg total) by mouth at bedtime. 10/12/22 08/24/23 Yes Chesley Noon, MD  risperiDONE (RISPERDAL) 0.25 MG tablet Take 1 tablet (0.25 mg total) by mouth at bedtime. Patient taking differently: Take 0.25 mg by mouth 2 (two) times daily. 10/12/22 08/25/23 Yes Chesley Noon, MD  rosuvastatin (CRESTOR) 20 MG tablet Take 1 tablet (20 mg total) by mouth daily. Patient taking differently: Take 20 mg by mouth at bedtime. 04/27/22  Yes Sherlene Shams, MD  timolol (TIMOPTIC) 0.5 % ophthalmic solution Place 1 drop into both eyes 2 (two) times daily. 02/24/23  Yes [provider]  ALPRAZolam Prudy Feeler) 0.5 MG tablet Take 0.5 mg by mouth daily. Patient not taking: Reported on 08/25/2023 01/12/23   [provider]  cyanocobalamin 1000 MCG tablet Take one by mouth daily Patient not taking: Reported on 08/25/2023    [provider]  HYDROcodone-acetaminophen (NORCO/VICODIN) 5-325 MG tablet Take 1 tablet by mouth every 6 (six) hours as needed for moderate pain. Patient not taking: Reported on 08/25/2023 03/06/23  Marrion Coy, MD  telmisartan (MICARDIS) 20 MG tablet  Take 1 tablet (20 mg total) by mouth daily. 10/12/22 03/06/23  Chesley Noon, MD    Scheduled Meds:  Chlorhexidine Gluconate Cloth  6 each Topical Daily   enoxaparin (LOVENOX) injection  30 mg Subcutaneous Q24H   guaiFENesin  600 mg Oral BID   hydrocortisone sod succinate (SOLU-CORTEF) inj  100 mg Intravenous Q12H   insulin aspart  0-9 Units Subcutaneous TID WC   midodrine  10 mg Oral TID WC   sodium chloride flush  3 mL Intravenous Q12H   Continuous Infusions:  cefTRIAXone (ROCEPHIN)  IV Stopped (08/27/23 2051)   metronidazole Stopped (08/27/23 2215)   norepinephrine (LEVOPHED) Adult infusion Stopped (08/27/23 2159)   PRN Meds:.acetaminophen **OR** acetaminophen, chlorpheniramine-HYDROcodone, ipratropium-albuterol, ondansetron **OR** ondansetron (ZOFRAN) IV, mouth rinse      Vida Rigger, M.D.  Pulmonary & Critical Care Medicine       *This note was generated using voice recognition software/Dragon and/or AI transcription program.  Despite best efforts to proofread, errors can occur which can change the meaning. Any transcriptional errors that result from this process are unintentional and may not be fully corrected at the time of dictation.

## 2023-08-28 NOTE — Progress Notes (Signed)
Speech Language Pathology Treatment: Dysphagia  Patient Details Name: GLORIA RICARDO MRN: [id number] DOB: Jun 19, 1935 Today's Date: 08/28/2023 Time: 0835-0900 SLP Time Calculation (min) (ACUTE ONLY): 25 min  Assessment / Plan / Recommendation Clinical Impression  Pt seen for follow up dysphagia intervention following BSE completed on 1/30. Pt now advanced to regular solids diet, with RN reporting pt self feeding t/o day yesterday without issue. Pt seen this date for education and skilled observation. Pt seen with trials of thin liquids (via straw) and regular solids. No overt or subtle s/sx pharyngeal dysphagia noted. No change to vocal quality across trials. Vitals stable for duration of trials. Pt independently maintained slow rate and small bite/sip size in congruence with aspiration precautions. Based on pt independence/lack of s/sx of aspiration during session, no further SLP services indicated. Recommend continued general aspiration precautions- slow rate, small bites, elevated HOB, and alert for PO intake.    HPI HPI: Per MD Progress Note: "JAX ABDELRAHMAN is a 89 y.o. female with medical history significant of dementia, CAD s/p PCI (1995), chronic systolic CHF, hypertension, hyperlipidemia, CKD stage IIIa, hyperkalemia 2/2 lisinopril, type 2 diabetes, COPD, who presents to the ED due to nausea/vomiting/diarrhea and weakness. CXR: Mild streaky bibasilar opacities could reflect atelectasis or infiltrate. CT scan: Right lower lobe developing infiltrate or aspiration." Pt is currently on a regular solids and thin liquids diet and 3 L nasal canula      SLP Plan  All goals met      Recommendations for follow up therapy are one component of a multi-disciplinary discharge planning process, led by the attending physician.  Recommendations may be updated based on patient status, additional functional criteria and insurance authorization.    Recommendations  Diet recommendations: Regular;Thin  liquid Liquids provided via: Straw;Cup Medication Administration: Whole meds with liquid Supervision: Patient able to self feed Compensations: Minimize environmental distractions;Slow rate;Small sips/bites Postural Changes and/or Swallow Maneuvers: Seated upright 90 degrees;Upright 30-60 min after meal                  Oral care BID   None (with regards to PO intake) Dysphagia, unspecified (R13.10)     All goals met  [place] Sharnise Blough Clapp  MS [facility] SLP    [place] J Clapp  08/28/2023, 9:58 AM

## 2023-08-28 NOTE — Progress Notes (Signed)
PHARMACY CONSULT NOTE - FOLLOW UP  Pharmacy Consult for Electrolyte Monitoring and Replacement   Recent Labs: Potassium (mmol/L)  Date Value  08/28/2023 4.7  02/01/2013 3.6   Magnesium (mg/dL)  Date Value  54/03/8118 2.4  02/17/2012 1.5 (L)   Calcium (mg/dL)  Date Value  14/78/2956 8.7 (L)   Calcium, Total (mg/dL)  Date Value  21/30/8657 9.3   Albumin (g/dL)  Date Value  84/69/6295 3.4 (L)  09/21/2012 3.2 (L)   Phosphorus (mg/dL)  Date Value  28/41/3244 3.7   Sodium (mmol/L)  Date Value  08/28/2023 136  08/09/2014 139  02/01/2013 137     Assessment: 89 year old female, remote former smoker, with complex medical history as noted below to include dementia, coronary artery disease, CKD IIIa and cardiomyopathy who presented to [facility] ED on 24 August 2023 due to nausea, vomiting and generalized weakness. Pharmacy is asked to follow and replace electrolytes while in CCU  Goal of Therapy:  Electrolytes WNL  Plan:  Electrolytes stable. No replacement needed. Pharmacy to sign off. Please re-consult if needed.   Ronnald Ramp ,PharmD Clinical Pharmacist 08/28/2023 9:25 AM

## 2023-08-28 NOTE — Progress Notes (Signed)
Pt with incontinent episode and 50mL via purewick.  Bladder scan revealed .  NP, [place] notified.  In/out advised per policy.  This will be her 3rd in/out since admission.

## 2023-08-29 DIAGNOSIS — K529 Noninfective gastroenteritis and colitis, unspecified: Secondary | ICD-10-CM | POA: Diagnosis not present

## 2023-08-29 LAB — BASIC METABOLIC PANEL
Anion gap: 11 (ref 5–15)
BUN: 67 mg/dL — ABNORMAL HIGH (ref 8–23)
CO2: 21 mmol/L — ABNORMAL LOW (ref 22–32)
Calcium: 9.1 mg/dL (ref 8.9–10.3)
Chloride: 106 mmol/L (ref 98–111)
Creatinine, Ser: 1.67 mg/dL — ABNORMAL HIGH (ref 0.44–1.00)
GFR, Estimated: 30 mL/min — ABNORMAL LOW (ref >60)
Glucose, Bld: 122 mg/dL — ABNORMAL HIGH (ref 70–99)
Potassium: 3.6 mmol/L (ref 3.5–5.1)
Sodium: 138 mmol/L (ref 135–145)

## 2023-08-29 LAB — GLUCOSE, CAPILLARY
Glucose-Capillary: 121 mg/dL — ABNORMAL HIGH (ref 70–99)
Glucose-Capillary: 131 mg/dL — ABNORMAL HIGH (ref 70–99)
Glucose-Capillary: 146 mg/dL — ABNORMAL HIGH (ref 70–99)
Glucose-Capillary: 220 mg/dL — ABNORMAL HIGH (ref 70–99)

## 2023-08-29 LAB — CBC
HCT: 27.9 % — ABNORMAL LOW (ref 36.0–46.0)
Hemoglobin: 8.9 g/dL — ABNORMAL LOW (ref 12.0–15.0)
MCH: 30.1 pg (ref 26.0–34.0)
MCHC: 31.9 g/dL (ref 30.0–36.0)
MCV: 94.3 fL (ref 80.0–100.0)
Platelets: 129 10*3/uL — ABNORMAL LOW (ref 150–400)
RBC: 2.96 MIL/uL — ABNORMAL LOW (ref 3.87–5.11)
RDW: 13.1 % (ref 11.5–15.5)
WBC: 10.4 10*3/uL (ref 4.0–10.5)
nRBC: 0 % (ref 0.0–0.2)

## 2023-08-29 LAB — CULTURE, BLOOD (ROUTINE X 2): Culture: NO GROWTH

## 2023-08-29 NOTE — Progress Notes (Signed)
PROGRESS NOTE    Audrey HEIDEL  ZOX:[id number]  DOB: Jun 26, 1935  DOA: 08/24/2023 PCP: Sherlene Shams, MD Outpatient Specialists:   Hospital course:  89 year old female, remote former smoker, with dementia, CAD, PAF, CKD IIIa and HFpEF, EF 50% in 2017 who was admitted for nausea, vomiting and diarrhea thought to be secondary to gastroenteritis as well as AKI.  Patient was hydrated however subsequently developed respiratory distress requiring BiPAP.  Patient was transferred to the ICU where she was diuresed and treated with steroids and temporarily did require some peripheral Levophed.  Is transferred out today with improved respiratory status off of pressors.  01/28 admitted with working diagnosis of gastroenteritis, hypotension and volume depletion 01/29 developed respiratory distress, required BiPAP, high risk for aspiration, Pulmonary Medicine consulted, BNP elevated 01/30 marked improvement in mental status weaned to 4 L/min nasal cannula 08/27/23- patient on levophed.  She is alert and is on 5L/min [place].  Starting solucortef today,  I connected with daughter today and reviewed medical plan.  08/28/23- patient is clinically improved, off vasopressor support.  Her labwork shows downtrending leukocytosis with stable anemia, bmp with improved AKI.  O2 req down to 2L/min and mental status pleasant but baseline mild cognitive impairment with early stages of dementia.  Subjective:  Patient states she feels better.  She was seen with her daughter in attendance.  Patient denies any further shortness of breath.  Also notes that her appetite is back, no nausea vomiting or diarrhea lately.  She admits to mild confusion although is able to state that she is at [facility] because she was having difficulty breathing and having diarrhea.  Objective: Vitals:   08/29/23 0451 08/29/23 0500 08/29/23 0751 08/29/23 1130  BP: (!) 122/52  135/82 (!) 144/53  Pulse: 63  66 (!) 57  Resp: 20  17 17   Temp:  97.9 F (36.6 C)  98.1 F (36.7 C) 97.7 F (36.5 C)  TempSrc: Oral     SpO2: 90%  98% 99%  Weight:  61.7 kg    Height:        Intake/Output Summary (Last 24 hours) at 08/29/2023 1240 Last data filed at 08/29/2023 0900 Gross per 24 hour  Intake 720 ml  Output 1 ml  Net 719 ml   Filed Weights   08/27/23 0351 08/28/23 0500 08/29/23 0500  Weight: 59 kg 59 kg 61.7 kg     Exam:  General: Patient sitting up in recliner eating breakfast with attentive daughter at bedside Eyes: sclera anicteric, conjuctiva mild injection bilaterally CVS: S1-S2, regular  Respiratory: Reasonable air entry bilaterally with a few rales at bases  GI: NABS, soft, NT  LE: Warm and well-perfused Neuro: Memory loss but otherwise grossly nonfocal.    Data Reviewed:  Basic Metabolic Panel: Recent Labs  Lab 08/25/23 0345 08/26/23 0014 08/27/23 0421 08/28/23 0356 08/29/23 0525  NA 138 138 136 136 138  K 4.8 5.0 4.5 4.7 3.6  CL 108 107 105 103 106  CO2 21* 18* 23 22 21*  GLUCOSE 132* 145* 112* 110* 122*  BUN 43* 38* 43* 55* 67*  CREATININE 1.67* 2.02* 1.79* 1.72* 1.67*  CALCIUM 9.2 9.1 8.6* 8.7* 9.1  MG 1.5* 2.6* 2.3 2.4  --   PHOS 2.2* 4.3 3.5 3.7  --     CBC: Recent Labs  Lab 08/24/23 1210 08/25/23 0345 08/26/23 0014 08/27/23 0421 08/28/23 0356 08/29/23 0525  WBC 24.2* 16.1* 13.5* 16.1* 13.3* 10.4  NEUTROABS 21.3*  --   --   --   --   --  HGB 11.1* 9.4* 9.7* 8.8* 8.7* 8.9*  HCT 34.3* 28.9* 29.9* 27.1* 26.7* 27.9*  MCV 94.8 94.8 96.1 94.4 94.3 94.3  PLT 170 125* 122* 129* 133* 129*     Scheduled Meds:  cefUROXime  250 mg Oral BID WC   enoxaparin (LOVENOX) injection  30 mg Subcutaneous Q24H   guaiFENesin  600 mg Oral BID   hydrocortisone sod succinate (SOLU-CORTEF) inj  100 mg Intravenous Q12H   insulin aspart  0-9 Units Subcutaneous TID WC   metroNIDAZOLE  500 mg Oral Q12H   midodrine  10 mg Oral TID WC   sodium chloride flush  3 mL Intravenous Q12H   Continuous  Infusions:   Assessment & Plan:   Acute hypoxic respiratory failure--resolved Possible aspiration pneumonia Decompensated HFpEF, EF 50 to 55% on echocardiogram 08/25/2023 Patient doing well after BiPAP support in ICU Continue cefuroxime and metronidazole for treatment of presumptive aspiration pneumonia/gastroenteritis Discussed with ICU, can discontinue hydrocortisone which was started for possible adrenal insufficiency and hypotension  Nausea vomiting diarrhea Likely secondary to acute gastroenteritis Patient denies any further nausea or vomiting or diarrhea Leukocytosis resolved Continue cefuroxime and metronidazole as noted above  CKD 3 AA Creatinine is at baseline Avoid nephrotoxic agents for further intravascular volume depletion  Dementia Patient with mild dementia Not on any anticholinergics on home meds   DVT prophylaxis: Lovenox Code Status: DNR Family Communication: Patient's daughter was at bedside throughout     Studies: No results found.  Principal Problem:   Acute gastroenteritis Active Problems:   Hypotension   Acute kidney injury superimposed on chronic kidney disease (HCC)   Cough   Coronary artery disease   Paroxysmal atrial fibrillation (HCC)   Hyperkalemia   Essential hypertension   Dementia with agitation (HCC)   (HFpEF) heart failure with preserved ejection fraction (HCC)   Acute hypoxic respiratory failure (HCC)     Clinton Wahlberg Tublu Sharyl Panchal, [facility]  If 7PM-7AM, please contact night-coverage [email or web address]   LOS: 4 days

## 2023-08-29 NOTE — Progress Notes (Signed)
Patient resting in bed. No needs identified. Daughter at bedside.

## 2023-08-29 NOTE — Plan of Care (Signed)
  Problem: Coping: Goal: Ability to adjust to condition or change in health will improve Outcome: Progressing   Problem: Clinical Measurements: Goal: Ability to maintain clinical measurements within normal limits will improve Outcome: Progressing Goal: Will remain free from infection Outcome: Progressing   Problem: Coping: Goal: Level of anxiety will decrease Outcome: Progressing

## 2023-08-30 DIAGNOSIS — J9601 Acute respiratory failure with hypoxia: Secondary | ICD-10-CM | POA: Diagnosis not present

## 2023-08-30 DIAGNOSIS — K529 Noninfective gastroenteritis and colitis, unspecified: Secondary | ICD-10-CM | POA: Diagnosis not present

## 2023-08-30 LAB — BASIC METABOLIC PANEL
Anion gap: 9 (ref 5–15)
BUN: 66 mg/dL — ABNORMAL HIGH (ref 8–23)
CO2: 23 mmol/L (ref 22–32)
Calcium: 8.8 mg/dL — ABNORMAL LOW (ref 8.9–10.3)
Chloride: 107 mmol/L (ref 98–111)
Creatinine, Ser: 1.41 mg/dL — ABNORMAL HIGH (ref 0.44–1.00)
GFR, Estimated: 36 mL/min — ABNORMAL LOW (ref >60)
Glucose, Bld: 107 mg/dL — ABNORMAL HIGH (ref 70–99)
Potassium: 3.3 mmol/L — ABNORMAL LOW (ref 3.5–5.1)
Sodium: 139 mmol/L (ref 135–145)

## 2023-08-30 LAB — CBC
HCT: 27.3 % — ABNORMAL LOW (ref 36.0–46.0)
Hemoglobin: 8.7 g/dL — ABNORMAL LOW (ref 12.0–15.0)
MCH: 30.3 pg (ref 26.0–34.0)
MCHC: 31.9 g/dL (ref 30.0–36.0)
MCV: 95.1 fL (ref 80.0–100.0)
Platelets: 134 10*3/uL — ABNORMAL LOW (ref 150–400)
RBC: 2.87 MIL/uL — ABNORMAL LOW (ref 3.87–5.11)
RDW: 13.2 % (ref 11.5–15.5)
WBC: 11.3 10*3/uL — ABNORMAL HIGH (ref 4.0–10.5)
nRBC: 0 % (ref 0.0–0.2)

## 2023-08-30 LAB — GLUCOSE, CAPILLARY
Glucose-Capillary: 102 mg/dL — ABNORMAL HIGH (ref 70–99)
Glucose-Capillary: 125 mg/dL — ABNORMAL HIGH (ref 70–99)
Glucose-Capillary: 91 mg/dL (ref 70–99)
Glucose-Capillary: 102 mg/dL — ABNORMAL HIGH (ref 70–99)
Glucose-Capillary: 89 mg/dL (ref 70–99)

## 2023-08-30 MED ORDER — RISPERIDONE 0.5 MG PO TABS
0.2500 mg | ORAL_TABLET | Freq: Every day | ORAL | Status: DC
  Administered 2023-08-30 – 2023-09-02 (×4): 0.25 mg via ORAL
  Filled 2023-08-30 (×4): qty 1

## 2023-08-30 MED ORDER — MIRTAZAPINE 15 MG PO TABS
7.5000 mg | ORAL_TABLET | Freq: Every day | ORAL | Status: DC
  Administered 2023-08-30 – 2023-09-02 (×4): 7.5 mg via ORAL
  Filled 2023-08-30 (×4): qty 1

## 2023-08-30 MED ORDER — FUROSEMIDE 20 MG PO TABS
20.0000 mg | ORAL_TABLET | Freq: Every day | ORAL | Status: DC
  Administered 2023-08-30 – 2023-09-03 (×5): 20 mg via ORAL
  Filled 2023-08-30 (×5): qty 1

## 2023-08-30 MED ORDER — ASPIRIN 81 MG PO TBEC
81.0000 mg | DELAYED_RELEASE_TABLET | Freq: Every day | ORAL | Status: DC
  Administered 2023-08-30 – 2023-09-02 (×4): 81 mg via ORAL
  Filled 2023-08-30 (×4): qty 1

## 2023-08-30 MED ORDER — PSEUDOEPHEDRINE HCL 30 MG PO TABS
60.0000 mg | ORAL_TABLET | Freq: Four times a day (QID) | ORAL | Status: AC
Start: 2023-08-30 — End: 2023-08-30
  Administered 2023-08-30 (×2): 60 mg via ORAL
  Filled 2023-08-30 (×2): qty 2

## 2023-08-30 MED ORDER — LISINOPRIL 5 MG PO TABS
5.0000 mg | ORAL_TABLET | Freq: Every day | ORAL | Status: DC
  Administered 2023-08-30 – 2023-09-01 (×3): 5 mg via ORAL
  Filled 2023-08-30 (×4): qty 1

## 2023-08-30 MED ORDER — ACYCLOVIR 200 MG PO CAPS
400.0000 mg | ORAL_CAPSULE | Freq: Every day | ORAL | Status: DC
  Administered 2023-08-30 – 2023-09-03 (×5): 400 mg via ORAL
  Filled 2023-08-30 (×5): qty 2

## 2023-08-30 MED ORDER — POTASSIUM CHLORIDE CRYS ER 20 MEQ PO TBCR
40.0000 meq | EXTENDED_RELEASE_TABLET | Freq: Two times a day (BID) | ORAL | Status: AC
Start: 2023-08-30 — End: 2023-08-30
  Administered 2023-08-30 (×2): 40 meq via ORAL
  Filled 2023-08-30 (×2): qty 2

## 2023-08-30 MED ORDER — CITALOPRAM HYDROBROMIDE 20 MG PO TABS
20.0000 mg | ORAL_TABLET | Freq: Every day | ORAL | Status: DC
  Administered 2023-08-30 – 2023-09-03 (×5): 20 mg via ORAL
  Filled 2023-08-30 (×5): qty 1

## 2023-08-30 NOTE — Progress Notes (Signed)
Physical Therapy Treatment Patient Details Name: Audrey Duran MRN: [id number] DOB: February 14, 1935 Today's Date: 08/30/2023   History of Present Illness Patient is a 89 year old female with weakness, vomiting, diarrhea, acute hypoxic respiratory failure, pneumonia, possible aspiration. Hypotension requiring pressor support. History of T11 fracture, dementia COPD, CAD, CHF, HTN, CKD, diabetes mellitus.    PT Comments  Pt was pleasant and motivated to participate during the session and put forth good effort throughout. Pt found on 3LO2/min with SpO2 92-93% and HR 64 bpm.  Pt required extra time and effort with bed mobility tasks and transfers with cues for sequencing but required no physical assist.  Pt was able to amb 30 feet with slow cadence but with no overt LOB with cues for improved positioning with the walker and upright posture.  SpO2 checked after amb 30 feet and found to be at 83% with pt exhibiting no adverse symptoms. Pt's O2 increased to 4L for return walk back to room with SpO2 at 84%.  Pt given cues for PLB during session with SpO2 quickly returning back to low 90s at rest and returned to 3L, nsg and MD notified.  Pt will benefit from continued PT services upon discharge to safely address deficits listed in patient problem list for decreased caregiver assistance and eventual return to PLOF.      If plan is discharge home, recommend the following: A little help with walking and/or transfers;A little help with bathing/dressing/bathroom;Supervision due to cognitive status;Help with stairs or ramp for entrance;Direct supervision/assist for medications management;Direct supervision/assist for financial management;Assist for transportation   Can travel by private vehicle        Equipment Recommendations  Rolling walker (2 wheels) (Unsure of what pt owns at ALF)    Recommendations for Other Services       Precautions / Restrictions Precautions Precautions: Fall Precaution Comments:  watch BP, SpO2 Restrictions Weight Bearing Restrictions Per Provider Order: No     Mobility  Bed Mobility Overal bed mobility: Needs Assistance       Supine to sit: Supervision     General bed mobility comments: Min extra time and effort with cues for use of bed rail    Transfers Overall transfer level: Needs assistance Equipment used: Rolling walker (2 wheels) Transfers: Sit to/from Stand Sit to Stand: Contact guard assist           General transfer comment: Min verbal cues for hand placement    Ambulation/Gait Ambulation/Gait assistance: Contact guard assist Gait Distance (Feet): 60 Feet Assistive device: Rolling walker (2 wheels) Gait Pattern/deviations: Step-through pattern, Decreased step length - right, Decreased step length - left Gait velocity: decreased     General Gait Details: Min verbal cues for amb closer to the RW; slow cadence with short B step length but steady with no overt LOB   Stairs             Wheelchair Mobility     Tilt Bed    Modified Rankin (Stroke Patients Only)       Balance Overall balance assessment: Needs assistance Sitting-balance support: Feet supported Sitting balance-Leahy Scale: Good     Standing balance support: Bilateral upper extremity supported, During functional activity, Reliant on assistive device for balance Standing balance-Leahy Scale: Good                              Cognition Arousal: Alert   Overall Cognitive Status: No family/caregiver present to  determine baseline cognitive functioning                                          Exercises  Static unsupported sitting at EOB x 8 with several bouts of reaching outside BOS for core strengthening and improved activity tolerance     General Comments        Pertinent Vitals/Pain Pain Assessment Pain Assessment: No/denies pain    Home Living                          Prior Function            PT  Goals (current goals can now be found in the care plan section) Progress towards PT goals: Progressing toward goals    Frequency    Min 1X/week      PT Plan      Co-evaluation              AM-PAC PT "6 Clicks" Mobility   Outcome Measure  Help needed turning from your back to your side while in a flat bed without using bedrails?: None Help needed moving from lying on your back to sitting on the side of a flat bed without using bedrails?: A Little Help needed moving to and from a bed to a chair (including a wheelchair)?: A Little Help needed standing up from a chair using your arms (e.g., wheelchair or bedside chair)?: A Little Help needed to walk in hospital room?: A Little Help needed climbing 3-5 steps with a railing? : A Little 6 Click Score: 19    End of Session Equipment Utilized During Treatment: Oxygen;Gait belt Activity Tolerance: Patient tolerated treatment well Patient left: in chair;with call bell/phone within reach;with chair alarm set Nurse Communication: Mobility status;Other (comment) (SpO2 with activity per above) PT Visit Diagnosis: Muscle weakness (generalized) (M62.81);Unsteadiness on feet (R26.81);Difficulty in walking, not elsewhere classified (R26.2)     Time: 2440-1027 PT Time Calculation (min) (ACUTE ONLY): 26 min  Charges:    $Gait Training: 8-22 mins $Therapeutic Activity: 8-22 mins PT General Charges $$ ACUTE PT VISIT: 1 Visit                     D. Scott Fumiko Cham PT, DPT 08/30/23, 12:01 PM

## 2023-08-30 NOTE — TOC Initial Note (Signed)
Transition of Care [facility]) - Initial/Assessment Note    Patient Details  Name: Audrey Duran MRN: [id number] Date of Birth: [birth year]  [facility]) CM/SW Contact:    Allena Katz, LCSW Phone Number: 08/30/2023, 10:04 AM  Clinical Narrative:                 Pt not fully oriented. Pt admitted from the [facility] with [facility] recommendation. Per previous TOC note, pt was already active with [facility]. CSW to follow up and see if additional orders are needed.  Expected Discharge Plan: Home w Home Health Services Barriers to Discharge: Continued Medical Work up   Patient Goals and CMS Choice            Expected Discharge Plan and Services     Post Acute Care Choice: Home Health Living arrangements for the past 2 months: Assisted Living Facility                             [facility] Agency: [facility]     Representative spoke with at [facility]: sheila  Prior Living Arrangements/Services Living arrangements for the past 2 months: Assisted Living Facility Lives with:: Facility Resident Patient language and need for interpreter reviewed:: Yes Do you feel safe going back to the place where you live?: Yes      Need for Family Participation in Patient Care: Yes (Comment) Care giver support system in place?: Yes (comment) Current home services: Home PT, Home OT    Activities of Daily Living   ADL Screening (condition at time of admission) Independently performs ADLs?: No Does the patient have a NEW difficulty with bathing/dressing/toileting/self-feeding that is expected to last >3 days?: No Does the patient have a NEW difficulty with getting in/out of bed, walking, or climbing stairs that is expected to last >3 days?: No Does the patient have a NEW difficulty with communication that is expected to last >3 days?: No Is the patient deaf or have difficulty hearing?: No Does the patient have difficulty seeing, even when wearing glasses/contacts?: No Does  the patient have difficulty concentrating, remembering, or making decisions?: No  Permission Sought/Granted                  Emotional Assessment       Orientation: : Fluctuating Orientation (Suspected and/or reported Sundowners)      Admission diagnosis:  Acute gastroenteritis [K52.9] Hyperkalemia [E87.5] AKI (acute kidney injury) (HCC) [N17.9] Nausea vomiting and diarrhea [R11.2, R19.7] Hypotension, unspecified hypotension type [I95.9] Acute hypoxic respiratory failure (HCC) [J96.01] Patient Active Problem List   Diagnosis Date Noted   Acute hypoxic respiratory failure (HCC) 08/25/2023   Acute gastroenteritis 08/24/2023   Bradycardia 08/24/2023   (HFpEF) heart failure with preserved ejection fraction (HCC) 08/24/2023   Cough 08/24/2023   Compression fracture of T11 vertebra (HCC) 03/05/2023   Impaired ambulation 03/05/2023   Dementia with agitation (HCC) 10/13/2022   Altered mental status 10/07/2022   Protein-calorie malnutrition (HCC) 10/07/2022   Decreased appetite 10/07/2022   Acute kidney injury superimposed on chronic kidney disease (HCC) 04/27/2022   Essential hypertension 10/09/2020   Aortic atherosclerosis (HCC) 10/08/2020   Grief at loss of child 04/05/2020   Hypotension 03/03/2020   S/P laparoscopic appendectomy 02/27/2020   History of herpes labialis 02/05/2020   Lower extremity edema 02/01/2020   Delusional disorder (HCC) 01/30/2020   Psychosis, 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   Unintentional weight loss 04/16/2019  Episodes of formed visual hallucinations 10/12/2018   Abnormal mammogram of right breast 04/08/2018   Impaired fasting glucose 08/29/2017   Prediabetes 05/21/2016   History of vertigo 11/16/2015   Hospital discharge follow-up 04/19/2015   History of Clostridium difficile colitis 12/14/2014   Long-term use of high-risk medication 05/08/2014   Cardiomyopathy (HCC) 02/15/2014   CHF (congestive heart failure) (HCC) 02/15/2014   H/O carotid  endarterectomy 02/15/2014   S/P TAH-BSO (total abdominal hysterectomy and bilateral salpingo-oophorectomy) 10/26/2013   Vitamin D deficiency 10/12/2013   CAD (coronary artery disease), native coronary artery 11/18/2012   CKD stage 3a, GFR 45-59 ml/min (HCC) 10/19/2012   Eosinophilia 09/21/2012   Hyperkalemia 09/21/2012   Anxiety state 09/20/2012   Osteopenia of the elderly 06/20/2012   At high risk for falls 06/20/2012   Encounter for preventive health examination 04/25/2012   Atrophic vaginitis 01/17/2012   Paroxysmal atrial fibrillation (HCC)    Recurrent HSV (herpes simplex virus)    COPD (chronic obstructive pulmonary disease) (HCC)    Ischemic cardiomyopathy    Cystocele or rectocele with complete uterine prolapse 08/02/2011   Screening for breast cancer 07/31/2011   Screening for cervical cancer 07/31/2011   Screening for colon cancer 07/31/2011   Coronary artery disease    History of myocardial infarction    Hyperlipidemia LDL goal <70    PCP:  Sherlene Shams, MD Pharmacy:   [facility] - [place], [place] - [address] [place] [address] Phone: [phone] Fax: [phone]     Social Drivers of Health (SDOH) Social History: SDOH Screenings   Food Insecurity: No Food Insecurity (08/26/2023)  Housing: Low Risk  (08/26/2023)  Transportation Needs: No Transportation Needs (08/26/2023)  Utilities: Not At Risk (08/26/2023)  Depression (PHQ2-9): Low Risk  (10/02/2022)  Financial Resource Strain: Low Risk  (12/03/2020)  Social Connections: Moderately Isolated (08/26/2023)  Stress: No Stress Concern Present (12/03/2020)  Tobacco Use: Medium Risk (08/26/2023)   SDOH Interventions:     Readmission Risk Interventions     No data to display

## 2023-08-30 NOTE — Progress Notes (Signed)
 Resting in bed with eyes closed. No needs identified.

## 2023-08-30 NOTE — Progress Notes (Addendum)
PROGRESS NOTE Audrey Duran  WCB:[id number]  DOB: 1935/05/14  DOA: 08/24/2023 PCP: Sherlene Shams, MD  Hospital course:  89 year old female, remote former smoker, with dementia, CAD, PAF, CKD IIIa and HFpEF, EF 50% in 2017 who was admitted for nausea, vomiting and diarrhea thought to be secondary to gastroenteritis as well as AKI.  Patient was hydrated however subsequently developed respiratory distress requiring BiPAP.  Patient was transferred to the ICU where she was diuresed and treated with steroids and temporarily did require some peripheral Levophed.  off of pressors. Her gastroenteritis has resolved.  Now euvolemic and respiratory status stable at rest but still desaturating with exertion. Continue to wean O2 and use incentive spirometer.  She should be ready for dc with HH in the next day or so.  Subjective: Pt reports feeling well. She was up walking with PT and found to be hypoxic but states that she did not feel short of breath during that time. Had residual cough overnight which interfered with sleep. Otherwise improving.  Objective: Vitals:   08/29/23 1130 08/29/23 1610 08/29/23 1945 08/30/23 0550  BP: (!) 144/53 (!) 145/79 (!) 148/53 (!) 147/64  Pulse: (!) 57 (!) 53 60 63  Resp: 17 17 20    Temp: 97.7 F (36.5 C) 97.8 F (36.6 C)  97.8 F (36.6 C)  TempSrc:      SpO2: 99% 100% 98% 98%  Weight:      Height:        Intake/Output Summary (Last 24 hours) at 08/30/2023 6160 Last data filed at 08/30/2023 0400 Gross per 24 hour  Intake 960 ml  Output --  Net 960 ml   Exam: General: Patient sitting up in recliner eating breakfast with attentive daughter at bedside Eyes: sclera anicteric, conjuctiva mild injection bilaterally CVS: S1-S2, regular  Respiratory: CTAB. No wheezing or rales. Decreased sounds bilateral bases with low lung volumes on inhalation. GI: NABS, soft, NT  LE: Warm and well-perfused. No edema Neuro: alert and conversationally appropriate. Able  to follow commands.    Data Reviewed:  Basic Metabolic Panel: Recent Labs  Lab 08/25/23 0345 08/26/23 0014 08/27/23 0421 08/28/23 0356 08/29/23 0525 08/30/23 0507  NA 138 138 136 136 138 139  K 4.8 5.0 4.5 4.7 3.6 3.3*  CL 108 107 105 103 106 107  CO2 21* 18* 23 22 21* 23  GLUCOSE 132* 145* 112* 110* 122* 107*  BUN 43* 38* 43* 55* 67* 66*  CREATININE 1.67* 2.02* 1.79* 1.72* 1.67* 1.41*  CALCIUM 9.2 9.1 8.6* 8.7* 9.1 8.8*  MG 1.5* 2.6* 2.3 2.4  --   --   PHOS 2.2* 4.3 3.5 3.7  --   --     CBC: Recent Labs  Lab 08/24/23 1210 08/25/23 0345 08/26/23 0014 08/27/23 0421 08/28/23 0356 08/29/23 0525 08/30/23 0507  WBC 24.2*   < > 13.5* 16.1* 13.3* 10.4 11.3*  NEUTROABS 21.3*  --   --   --   --   --   --   HGB 11.1*   < > 9.7* 8.8* 8.7* 8.9* 8.7*  HCT 34.3*   < > 29.9* 27.1* 26.7* 27.9* 27.3*  MCV 94.8   < > 96.1 94.4 94.3 94.3 95.1  PLT 170   < > 122* 129* 133* 129* 134*   < > = values in this interval not displayed.   Assessment & Plan:   Acute hypoxic respiratory failure--improving. No baseline oxygen requirement.  Possible aspiration pneumonia Decompensated HFpEF, EF 50 to 55%  on echocardiogram 08/25/2023 Patient doing well after BiPAP support in ICU Completed antibiotic and steroid treatment.  S/p diuresis and appears to be euvolemic at this time.  2L at rest and increased to 4 with minimal exertion but asymptomatic.  - continue OOB as much as possible and PT/OT - incentive spirometer/fluter valve q1hr while awake.  - adding sudafed as well as mucinex for pm coughing  Nausea vomiting diarrhea- resolved Likely secondary to acute gastroenteritis  HTN- restarting home medications - lisinopril. Mild hypokalemia today replaced.   CKD 3- Creatinine is at baseline Avoid nephrotoxic agents for further intravascular volume depletion  Dementia  depression- baseline - restarting home meds  Celexa, remeron, risperdal   DVT prophylaxis: Lovenox Code Status:  DNR Family Communication: Patient's daughter was at bedside throughout  Studies: No results found.  Principal Problem:   Acute gastroenteritis Active Problems:   Hypotension   Acute kidney injury superimposed on chronic kidney disease (HCC)   Cough   Coronary artery disease   Paroxysmal atrial fibrillation (HCC)   Hyperkalemia   Essential hypertension   Dementia with agitation (HCC)   (HFpEF) heart failure with preserved ejection fraction (HCC)   Acute hypoxic respiratory failure (HCC)     Jensen Kilburg Jannette Fogo, [facility]  If 7PM-7AM, please contact night-coverage [email or web address]   LOS: 5 days

## 2023-08-30 NOTE — Progress Notes (Signed)
 Patient resting in bed with eyes closed. No needs identified.

## 2023-08-30 NOTE — Plan of Care (Signed)
  Problem: Coping: Goal: Ability to adjust to condition or change in health will improve Outcome: Progressing   Problem: Fluid Volume: Goal: Ability to maintain a balanced intake and output will improve Outcome: Progressing   Problem: Coping: Goal: Level of anxiety will decrease Outcome: Progressing   Problem: Elimination: Goal: Will not experience complications related to bowel motility Outcome: Progressing   Problem: Skin Integrity: Goal: Risk for impaired skin integrity will decrease Outcome: Progressing

## 2023-08-30 NOTE — Progress Notes (Signed)
Occupational Therapy Treatment Patient Details Name: Audrey Duran MRN: [id number] DOB: 05/24/1935 Today's Date: 08/30/2023   History of present illness Patient is a 89 year old female with weakness, vomiting, diarrhea, acute hypoxic respiratory failure, pneumonia, possible aspiration. Hypotension requiring pressor support. History of T11 fracture, dementia COPD, CAD, CHF, HTN, CKD, diabetes mellitus.   OT comments  Pt seen for OT tx. Pt napping in recliner, wakes easily, and agreeable to session. Endorses fatigue and mild SOB. VSS on 3L at rest. Pt required VC for hand placement with transfer from recliner and from [facility] with noted improvement in ease/safety of transfer when using optimal technique. Pt required CGA-SBA for ADL mobility in room with RW 5' + 5'. Pt endorsing 10/10 RPE with just 5 steps. SpO2 88-89% on 3L. Bumped to 4L for remainder of 5' to [facility]. VC for safety and RW mgt as pt was not aligned with BSC before preparing to descend. Pt set up for lateral lean pericare. MIN A for gown change. Once returned to bed, Pt left on 3L O2. Pt endorsed feeling fatigued and eager to rest. Pt educated in activity pacing and PLB to support safety and independence with ADL/mobility. Pt will benefit from additional skilled OT services.       If plan is discharge home, recommend the following:  A little help with walking and/or transfers;A little help with bathing/dressing/bathroom;Direct supervision/assist for medications management;Assist for transportation;Help with stairs or ramp for entrance;Direct supervision/assist for financial management;Assistance with cooking/housework;Supervision due to cognitive status   Equipment Recommendations  BSC/3in1    Recommendations for Other Services      Precautions / Restrictions Precautions Precautions: Fall Precaution Comments: watch BP, SpO2 Restrictions Weight Bearing Restrictions Per Provider Order: No       Mobility Bed Mobility Overal bed  mobility: Needs Assistance Bed Mobility: Sit to Supine       Sit to supine: Min assist   General bed mobility comments: MIN A for BLE mgt, VC to straighten up in bed for more midline positioning    Transfers Overall transfer level: Needs assistance Equipment used: Rolling walker (2 wheels) Transfers: Sit to/from Stand Sit to Stand: Contact guard assist           General transfer comment: VC for hand placement wiht notable improvement     Balance Overall balance assessment: Needs assistance Sitting-balance support: Feet supported Sitting balance-Leahy Scale: Good     Standing balance support: Bilateral upper extremity supported, During functional activity, Reliant on assistive device for balance Standing balance-Leahy Scale: Good                             ADL either performed or assessed with clinical judgement   ADL Overall ADL's : Needs assistance/impaired     Grooming: Sitting;Set up           Upper Body Dressing : Sitting;Minimal assistance Upper Body Dressing Details (indicate cue type and reason): gown change     Toilet Transfer: BSC/3in1;Rolling walker (2 wheels);Contact guard assist Toilet Transfer Details (indicate cue type and reason): VC for alignment in front of BSC/safety Toileting- Clothing Manipulation and Hygiene: Sitting/lateral lean;Set up;Supervision/safety       Functional mobility during ADLs: Contact guard assist;Supervision/safety;Rolling walker (2 wheels);Cueing for safety      Extremity/Trunk Assessment              Vision       Perception     Praxis  Cognition Arousal: Alert Behavior During Therapy: WFL for tasks assessed/performed Overall Cognitive Status: No family/caregiver present to determine baseline cognitive functioning                                 General Comments: Pt oriented to self, hospital, requires VC during session for PLB, safety with RW and ADL transfers         Exercises Other Exercises Other Exercises: Pt instructed in IS and [place] after noting pt unable to utilize independently. Pt required additional VC and repetition to improve technique. Anticipate she will need additional instruction to support optimal use. Other Exercises: Pt educated in activity pacing and PLB to maximize safety and minimize over exertion and SOB    Shoulder Instructions       General Comments On 3L at rest, desats to 88-89% on 3L within 5 steps and endorses 10/10 perceived rate of exertion. HR WNL. Bumped to 4L for remainder of ~5 steps to BSC, >90%, once returned to bed left on 3L    Pertinent Vitals/ Pain       Pain Assessment Pain Assessment: No/denies pain  Home Living                                          Prior Functioning/Environment              Frequency  Min 1X/week        Progress Toward Goals  OT Goals(current goals can now be found in the care plan section)  Progress towards OT goals: Progressing toward goals  Acute Rehab OT Goals Patient Stated Goal: get stronger OT Goal Formulation: With patient Time For Goal Achievement: 09/10/23 Potential to Achieve Goals: Good  Plan      Co-evaluation                 AM-PAC OT "6 Clicks" Daily Activity     Outcome Measure   Help from another person eating meals?: None Help from another person taking care of personal grooming?: None Help from another person toileting, which includes using toliet, bedpan, or urinal?: A Little Help from another person bathing (including washing, rinsing, drying)?: A Lot Help from another person to put on and taking off regular upper body clothing?: A Little Help from another person to put on and taking off regular lower body clothing?: A Lot 6 Click Score: 18    End of Session Equipment Utilized During Treatment: Rolling walker (2 wheels);Oxygen  OT Visit Diagnosis: Other abnormalities of gait and mobility (R26.89);Muscle  weakness (generalized) (M62.81)   Activity Tolerance Patient tolerated treatment well   Patient Left in bed;with call bell/phone within reach;with bed alarm set;with nursing/sitter in room   Nurse Communication Mobility status (BM)        Time: 1610-9604 OT Time Calculation (min): 22 min  Charges: OT General Charges $OT Visit: 1 Visit OT Treatments $Self Care/Home Management : 8-22 mins  Arman Filter., MPH, MS, OTR/L ascom [phone] 08/30/23, 4:44 PM

## 2023-08-31 DIAGNOSIS — K529 Noninfective gastroenteritis and colitis, unspecified: Secondary | ICD-10-CM | POA: Diagnosis not present

## 2023-08-31 DIAGNOSIS — J9601 Acute respiratory failure with hypoxia: Secondary | ICD-10-CM | POA: Diagnosis not present

## 2023-08-31 LAB — BASIC METABOLIC PANEL
Anion gap: 11 (ref 5–15)
BUN: 47 mg/dL — ABNORMAL HIGH (ref 8–23)
CO2: 20 mmol/L — ABNORMAL LOW (ref 22–32)
Calcium: 8.9 mg/dL (ref 8.9–10.3)
Chloride: 107 mmol/L (ref 98–111)
Creatinine, Ser: 1.27 mg/dL — ABNORMAL HIGH (ref 0.44–1.00)
GFR, Estimated: 41 mL/min — ABNORMAL LOW (ref >60)
Glucose, Bld: 114 mg/dL — ABNORMAL HIGH (ref 70–99)
Potassium: 4.1 mmol/L (ref 3.5–5.1)
Sodium: 138 mmol/L (ref 135–145)

## 2023-08-31 LAB — CULTURE, BLOOD (ROUTINE X 2): Culture: NO GROWTH

## 2023-08-31 LAB — GLUCOSE, CAPILLARY
Glucose-Capillary: 94 mg/dL (ref 70–99)
Glucose-Capillary: 141 mg/dL — ABNORMAL HIGH (ref 70–99)
Glucose-Capillary: 124 mg/dL — ABNORMAL HIGH (ref 70–99)

## 2023-08-31 MED ORDER — IPRATROPIUM-ALBUTEROL 0.5-2.5 (3) MG/3ML IN SOLN
3.0000 mL | Freq: Once | RESPIRATORY_TRACT | Status: AC
Start: 2023-08-31 — End: 2023-08-31
  Administered 2023-08-31: 3 mL via RESPIRATORY_TRACT
  Filled 2023-08-31: qty 3

## 2023-08-31 MED ORDER — BUDESONIDE 0.5 MG/2ML IN SUSP
0.5000 mg | Freq: Two times a day (BID) | RESPIRATORY_TRACT | Status: DC
Start: 2023-08-31 — End: 2023-09-03
  Administered 2023-08-31 – 2023-09-02 (×6): 0.5 mg via RESPIRATORY_TRACT
  Filled 2023-08-31 (×7): qty 2

## 2023-08-31 NOTE — Progress Notes (Signed)
 PROGRESS NOTE Audrey Duran  FMW:[id number]  DOB: 1934/12/16  DOA: 08/24/2023 PCP: Marylynn Verneita LITTIE, MD  Hospital course:  89 year old female, remote former smoker, with dementia, CAD, PAF, CKD IIIa and HFpEF, EF 50% in 2017 who was admitted for nausea, vomiting and diarrhea thought to be secondary to gastroenteritis as well as AKI.  Patient was hydrated however subsequently developed respiratory distress requiring BiPAP.  Patient was transferred to the ICU where she was diuresed and treated with steroids and temporarily did require some peripheral Levophed .  off of pressors. Her gastroenteritis has resolved.  Now euvolemic and respiratory status stable at rest but still desaturating with exertion. Continue to wean O2 and use incentive spirometer.  She should be ready for dc with HH in the next day or so.  Subjective: Pt just returned from ambulating in hall and going to bathroom during my exam. She has increased WOB and dyspnea which she states is improving since sitting back down. Cough overnight has improved with treatment.   Objective: Blood pressure (!) 132/55, pulse 63, temperature 98.2 F (36.8 C), resp. rate 16, height 5' 3 (1.6 m), weight 60.2 kg, SpO2 (!) 80%.  Exam: General: Patient sitting up in recliner Eyes: sclera anicteric, conjuctiva mild injection bilaterally CVS: S1-S2, regular  Respiratory: increased WOB. No wheezing or rales. Decreased sounds bilateral bases with low lung volumes on inhalation. GI: NABS, soft, NT  LE: Warm and well-perfused. No edema Neuro: alert and conversationally appropriate. Able to follow commands.   Data Reviewed:  Basic Metabolic Panel: Recent Labs  Lab 08/25/23 0345 08/26/23 0014 08/27/23 0421 08/28/23 0356 08/29/23 0525 08/30/23 0507  NA 138 138 136 136 138 139  K 4.8 5.0 4.5 4.7 3.6 3.3*  CL 108 107 105 103 106 107  CO2 21* 18* 23 22 21* 23  GLUCOSE 132* 145* 112* 110* 122* 107*  BUN 43* 38* 43* 55* 67* 66*   CREATININE 1.67* 2.02* 1.79* 1.72* 1.67* 1.41*  CALCIUM  9.2 9.1 8.6* 8.7* 9.1 8.8*  MG 1.5* 2.6* 2.3 2.4  --   --   PHOS 2.2* 4.3 3.5 3.7  --   --    CBC: Recent Labs  Lab 08/24/23 1210 08/25/23 0345 08/26/23 0014 08/27/23 0421 08/28/23 0356 08/29/23 0525 08/30/23 0507  WBC 24.2*   < > 13.5* 16.1* 13.3* 10.4 11.3*  NEUTROABS 21.3*  --   --   --   --   --   --   HGB 11.1*   < > 9.7* 8.8* 8.7* 8.9* 8.7*  HCT 34.3*   < > 29.9* 27.1* 26.7* 27.9* 27.3*  MCV 94.8   < > 96.1 94.4 94.3 94.3 95.1  PLT 170   < > 122* 129* 133* 129* 134*   < > = values in this interval not displayed.   Assessment & Plan: Acute hypoxic respiratory failure--improving. No baseline oxygen  requirement.  Possible aspiration pneumonia Decompensated HFpEF, EF 50 to 55% on echocardiogram 08/25/2023 Patient doing well after BiPAP support in ICU Completed antibiotic and steroid treatment.  S/p diuresis and appears to be euvolemic at this time.  2L at rest and increased to 4 with minimal exertion and symptomatic.  - continue OOB as much as possible and PT/OT - incentive spirometer/fluter valve q1hr while awake.  - adding sudafed as well as mucinex  for pm coughing  Nausea vomiting diarrhea- resolved Likely secondary to acute gastroenteritis  HTN- restarting home medications - lisinopril . Mild hypokalemia today replaced.   CKD 3- Creatinine  is at baseline Avoid nephrotoxic agents for further intravascular volume depletion  Dementia  depression- baseline - restarting home meds  Celexa , remeron , risperdal   DVT prophylaxis: Lovenox  Code Status: DNR Family Communication: Patient's daughter on phone  Studies: No results found.  Principal Problem:   Acute gastroenteritis Active Problems:   Hypotension   Acute kidney injury superimposed on chronic kidney disease (HCC)   Cough   Coronary artery disease   Paroxysmal atrial fibrillation (HCC)   Hyperkalemia   Essential hypertension   Dementia with  agitation (HCC)   (HFpEF) heart failure with preserved ejection fraction (HCC)   Acute hypoxic respiratory failure (HCC)   Audrey Duran Piety, [facility]  If 7PM-7AM, please contact night-coverage [email or web address]   LOS: 6 days

## 2023-08-31 NOTE — Progress Notes (Signed)
 Mobility Specialist - Progress Note  Post-mobility: HR 71, SPO2 86%   08/31/23 1357  Mobility  Activity Transferred from chair to bed  Level of Assistance Contact guard assist, steadying assist  Assistive Device Front wheel walker  Distance Ambulated (ft) 4 ft  Activity Response Tolerated well  Mobility visit 1 Mobility  Mobility Specialist Start Time (ACUTE ONLY) 1302  Mobility Specialist Stop Time (ACUTE ONLY) 1314  Mobility Specialist Time Calculation (min) (ACUTE ONLY) 12 min   Pt sitting in the recliner upon entry, utilizing 3L [place]. Pt STS to RW MinA and transferred to bed, taking a few short lateral/backward steps. Upon returned EOB, Pt noticeably SOB-- O2 85%, RN present at bedside. After one min PLB, O2 86% HR 71, RN still present at bedside. Pt left simi fowler with alarm set and needs within reach.  America Silvan Mobility Specialist 08/31/23 2:03 PM

## 2023-08-31 NOTE — Plan of Care (Signed)
  Problem: Education: Goal: Ability to describe self-care measures that may prevent or decrease complications (Diabetes Survival Skills Education) will improve Outcome: Progressing   Problem: Coping: Goal: Ability to adjust to condition or change in health will improve Outcome: Progressing   Problem: Fluid Volume: Goal: Ability to maintain a balanced intake and output will improve Outcome: Progressing   Problem: Clinical Measurements: Goal: Will remain free from infection Outcome: Progressing   Problem: Activity: Goal: Risk for activity intolerance will decrease Outcome: Progressing   Problem: Coping: Goal: Level of anxiety will decrease Outcome: Progressing   Problem: Safety: Goal: Ability to remain free from injury will improve Outcome: Progressing

## 2023-09-01 ENCOUNTER — Inpatient Hospital Stay: Payer: PPO

## 2023-09-01 DIAGNOSIS — J9601 Acute respiratory failure with hypoxia: Secondary | ICD-10-CM | POA: Diagnosis not present

## 2023-09-01 DIAGNOSIS — K529 Noninfective gastroenteritis and colitis, unspecified: Secondary | ICD-10-CM | POA: Diagnosis not present

## 2023-09-01 LAB — GLUCOSE, CAPILLARY
Glucose-Capillary: 92 mg/dL (ref 70–99)
Glucose-Capillary: 131 mg/dL — ABNORMAL HIGH (ref 70–99)
Glucose-Capillary: 116 mg/dL — ABNORMAL HIGH (ref 70–99)

## 2023-09-01 NOTE — Plan of Care (Signed)
  Problem: Education: Goal: Ability to describe self-care measures that may prevent or decrease complications (Diabetes Survival Skills Education) will improve 09/01/2023 1638 by Ariabella Brien, RN Outcome: Progressing 09/01/2023 1638 by Yukari Flax, RN Outcome: Progressing   Problem: Education: Goal: Individualized Educational Video(s) 09/01/2023 1638 by Tressia Labrum, RN Outcome: Progressing 09/01/2023 1638 by Neola Worrall, RN Outcome: Progressing   Problem: Coping: Goal: Ability to adjust to condition or change in health will improve 09/01/2023 1638 by Gwenette Wellons, RN Outcome: Progressing 09/01/2023 1638 by Tallula Grindle, RN Outcome: Progressing   Problem: Health Behavior/Discharge Planning: Goal: Ability to identify and utilize available resources and services will improve 09/01/2023 1638 by Vernessa Likes, RN Outcome: Progressing 09/01/2023 1638 by Delyla Sandeen, RN Outcome: Progressing Goal: Ability to manage health-related needs will improve 09/01/2023 1638 by Savior Himebaugh, RN Outcome: Progressing 09/01/2023 1638 by Randal Yepiz, RN Outcome: Progressing

## 2023-09-01 NOTE — Progress Notes (Signed)
 Occupational Therapy Treatment Patient Details Name: Audrey Duran MRN: [id number] DOB: 1935/05/05 Today's Date: 09/01/2023   History of present illness Patient is a 89 year old female with weakness, vomiting, diarrhea, acute hypoxic respiratory failure, pneumonia, possible aspiration. Hypotension requiring pressor support. History of T11 fracture, dementia COPD, CAD, CHF, HTN, CKD, diabetes mellitus.   OT comments  Audrey Duran was seen for OT treatment on this date, overlapping with PT for safe mobility. Upon arrival to room pt seated in chair, agreeable to tx. Pt requires SBA + RW for toilet t/f and pericare in sitting. SpO2 82% on RA, resolved to 92% on 2L [place]. Deferred standing ADLs citing fatigue. Educated on HEP including IS and flutter valve. Pt making good progress toward goals, will continue to follow POC. Discharge recommendation remains appropriate.        If plan is discharge home, recommend the following:  A little help with walking and/or transfers;A little help with bathing/dressing/bathroom;Direct supervision/assist for medications management;Assist for transportation;Help with stairs or ramp for entrance;Direct supervision/assist for financial management;Assistance with cooking/housework;Supervision due to cognitive status   Equipment Recommendations  BSC/3in1    Recommendations for Other Services      Precautions / Restrictions Precautions Precautions: Fall Restrictions Weight Bearing Restrictions Per Provider Order: No       Mobility Bed Mobility               General bed mobility comments: not tested    Transfers Overall transfer level: Needs assistance Equipment used: Rolling walker (2 wheels) Transfers: Sit to/from Stand Sit to Stand: Contact guard assist                 Balance Overall balance assessment: Needs assistance Sitting-balance support: Feet supported Sitting balance-Leahy Scale: Good     Standing balance support: Bilateral  upper extremity supported, During functional activity, Reliant on assistive device for balance Standing balance-Leahy Scale: Good                             ADL either performed or assessed with clinical judgement   ADL Overall ADL's : Needs assistance/impaired                                       General ADL Comments: SBA + RW for toilet t/f and pericare in sitting.      Cognition Arousal: Alert Behavior During Therapy: WFL for tasks assessed/performed Overall Cognitive Status: No family/caregiver present to determine baseline cognitive functioning                                                     Pertinent Vitals/ Pain       Pain Assessment Pain Assessment: No/denies pain   Frequency  Min 1X/week        Progress Toward Goals  OT Goals(current goals can now be found in the care plan section)  Progress towards OT goals: Progressing toward goals  Acute Rehab OT Goals OT Goal Formulation: With patient Time For Goal Achievement: 09/10/23 Potential to Achieve Goals: Good ADL Goals Pt Will Perform Grooming: with supervision;sitting;standing Pt Will Perform Lower Body Dressing: with supervision;sitting/lateral leans;sit to/from stand Pt Will Transfer to Toilet: with supervision;ambulating Pt  Will Perform Toileting - Clothing Manipulation and hygiene: with supervision;sit to/from stand;sitting/lateral leans  Plan      Co-evaluation                 AM-PAC OT 6 Clicks Daily Activity     Outcome Measure   Help from another person eating meals?: None Help from another person taking care of personal grooming?: None Help from another person toileting, which includes using toliet, bedpan, or urinal?: A Little Help from another person bathing (including washing, rinsing, drying)?: A Lot Help from another person to put on and taking off regular upper body clothing?: A Little Help from another person to put on  and taking off regular lower body clothing?: A Lot 6 Click Score: 18    End of Session Equipment Utilized During Treatment: Rolling walker (2 wheels);Oxygen   OT Visit Diagnosis: Other abnormalities of gait and mobility (R26.89);Muscle weakness (generalized) (M62.81)   Activity Tolerance Patient tolerated treatment well   Patient Left in chair;with call bell/phone within reach;with chair alarm set   Nurse Communication          Time: 1345-1400 OT Time Calculation (min): 15 min  Charges: OT General Charges $OT Visit: 1 Visit OT Treatments $Self Care/Home Management : 8-22 mins  Elston Slot, M.S. OTR/L  09/01/23, 2:14 PM  ascom [phone]

## 2023-09-01 NOTE — Progress Notes (Signed)
 PROGRESS NOTE Audrey Duran  FMW:[id number]  DOB: [birth year]  DOA: 08/24/2023 PCP: Marylynn Verneita LITTIE, MD  Hospital course:  89 year old female, remote former smoker, with dementia, CAD, PAF, CKD IIIa and HFpEF, EF 50% in 2017 who was admitted for nausea, vomiting and diarrhea thought to be secondary to gastroenteritis as well as AKI.  Patient was hydrated however subsequently developed respiratory distress requiring BiPAP.  Patient was transferred to the ICU where she was diuresed and treated with steroids and temporarily did require some peripheral Levophed .  off of pressors. Her gastroenteritis has resolved.  Now euvolemic and respiratory status stable at rest but still desaturating with exertion. Continue to wean O2 and use incentive spirometer.  She should be ready for dc with HH in the next day or so.  Subjective: Patient feels well today. Does not have shortness of breath or pain at rest. The oxygen  canula had been removed from her nose so she was sitting for unknown amount of time without oxygen  and didn't seem to be dyspneic.   Objective: Blood pressure (!) 132/55, pulse 63, temperature 98.2 F (36.8 C), resp. rate 16, height 5' 3 (1.6 m), weight 60.2 kg, SpO2 (!) 80%.  Exam: General: Patient sitting up in bed Eyes: sclera anicteric, conjuctiva mild injection bilaterally CVS: S1-S2, regular  Respiratory: increased WOB. No wheezing or rales. Decreased sounds bilateral bases with low lung volumes on inhalation. GI: NABS, soft, NT  LE: Warm and well-perfused. No edema Neuro: alert and conversationally appropriate. Able to follow commands.   Data Reviewed:  Basic Metabolic Panel: Recent Labs  Lab 08/26/23 0014 08/27/23 0421 08/28/23 0356 08/29/23 0525 08/30/23 0507 08/31/23 0931  NA 138 136 136 138 139 138  K 5.0 4.5 4.7 3.6 3.3* 4.1  CL 107 105 103 106 107 107  CO2 18* 23 22 21* 23 20*  GLUCOSE 145* 112* 110* 122* 107* 114*  BUN 38* 43* 55* 67* 66* 47*   CREATININE 2.02* 1.79* 1.72* 1.67* 1.41* 1.27*  CALCIUM  9.1 8.6* 8.7* 9.1 8.8* 8.9  MG 2.6* 2.3 2.4  --   --   --   PHOS 4.3 3.5 3.7  --   --   --    CBC: Recent Labs  Lab 08/26/23 0014 08/27/23 0421 08/28/23 0356 08/29/23 0525 08/30/23 0507  WBC 13.5* 16.1* 13.3* 10.4 11.3*  HGB 9.7* 8.8* 8.7* 8.9* 8.7*  HCT 29.9* 27.1* 26.7* 27.9* 27.3*  MCV 96.1 94.4 94.3 94.3 95.1  PLT 122* 129* 133* 129* 134*   Assessment & Plan: Acute hypoxic respiratory failure--improving. No baseline oxygen  requirement.  Possible aspiration pneumonia Decompensated HFpEF, EF 50 to 55% on echocardiogram 08/25/2023 Patient doing well after BiPAP support in ICU Completed antibiotic and steroid treatment.  S/p diuresis and appears to be euvolemic at this time.  2L at rest and increased to 4 with minimal exertion and symptomatic.  - continue OOB as much as possible and PT/OT - incentive spirometer/fluter valve q1hr while awake.  - adding sudafed as well as mucinex  for pm coughing  Nausea vomiting diarrhea- resolved Likely secondary to acute gastroenteritis  HTN- restarting home medications - lisinopril . Mild hypokalemia today replaced.   CKD 3- Creatinine is at baseline Avoid nephrotoxic agents for further intravascular volume depletion  Dementia  depression- baseline - restarting home meds  Celexa , remeron , risperdal   DVT prophylaxis: Lovenox  Code Status: DNR Family Communication: Patient's daughter on phone  Studies: No results found.  Principal Problem:   Acute gastroenteritis Active Problems:  Hypotension   Acute kidney injury superimposed on chronic kidney disease (HCC)   Cough   Coronary artery disease   Paroxysmal atrial fibrillation (HCC)   Hyperkalemia   Essential hypertension   Dementia with agitation (HCC)   (HFpEF) heart failure with preserved ejection fraction (HCC)   Acute hypoxic respiratory failure (HCC)   Yeshaya Vath LITTIE Piety, [facility]  If 7PM-7AM,  please contact night-coverage [email or web address]   LOS: 7 days

## 2023-09-01 NOTE — Progress Notes (Signed)
 Physical Therapy Treatment Patient Details Name: Audrey Duran MRN: [id number] DOB: 08/26/1934 Today's Date: 09/01/2023   History of Present Illness Patient is a 89 year old female with weakness, vomiting, diarrhea, acute hypoxic respiratory failure, pneumonia, possible aspiration. Hypotension requiring pressor support. History of T11 fracture, dementia COPD, CAD, CHF, HTN, CKD, diabetes mellitus.    PT Comments  Patient received with OT, RN present in room getting ready to stand up. Requires min A to stand from recliner and commode. Patient ambulated with RW 80 feet with cga. She ambulated on room air with sats dropping to mid 80%s. Patient returned to O2 via nasal canula. She is limited by fatigue and lethargy during session. Patient will continue to benefit from skilled PT to improve strength and independence.        If plan is discharge home, recommend the following: A little help with walking and/or transfers;A little help with bathing/dressing/bathroom;Supervision due to cognitive status;Help with stairs or ramp for entrance;Direct supervision/assist for medications management;Direct supervision/assist for financial management;Assist for transportation   Can travel by private vehicle      yes  Equipment Recommendations  Rolling walker (2 wheels)    Recommendations for Other Services       Precautions / Restrictions Precautions Precautions: Fall Precaution Comments: watch BP, SpO2 Restrictions Weight Bearing Restrictions Per Provider Order: No     Mobility  Bed Mobility               General bed mobility comments: not tested, patient in recliner    Transfers Overall transfer level: Needs assistance Equipment used: Rolling walker (2 wheels) Transfers: Sit to/from Stand Sit to Stand: Min assist           General transfer comment: VC for hand placement    Ambulation/Gait Ambulation/Gait assistance: Contact guard assist Gait Distance (Feet): 80  Feet Assistive device: Rolling walker (2 wheels) Gait Pattern/deviations: Step-through pattern, Decreased step length - right, Decreased step length - left Gait velocity: decreased     General Gait Details: slow cadence, fatigued with ambulation. O2 sats down to mid 80%s with ambulation on room air   Stairs             Wheelchair Mobility     Tilt Bed    Modified Rankin (Stroke Patients Only)       Balance Overall balance assessment: Needs assistance Sitting-balance support: Feet supported Sitting balance-Leahy Scale: Fair     Standing balance support: Bilateral upper extremity supported, During functional activity, Reliant on assistive device for balance Standing balance-Leahy Scale: Fair Standing balance comment: external support required                            Cognition Arousal: Alert, Lethargic Behavior During Therapy: WFL for tasks assessed/performed Overall Cognitive Status: No family/caregiver present to determine baseline cognitive functioning Area of Impairment: Orientation, Attention, Memory, Following commands, Safety/judgement, Awareness, Problem solving                 Orientation Level: Disoriented to, Situation Current Attention Level: Focused Memory: Decreased short-term memory, Decreased recall of precautions Following Commands: Follows one step commands with increased time Safety/Judgement: Decreased awareness of deficits, Decreased awareness of safety Awareness: Intellectual Problem Solving: Slow processing, Decreased initiation, Requires verbal cues, Requires tactile cues          Exercises Total Joint Exercises Ankle Circles/Pumps: AROM, Both, 10 reps Hip ABduction/ADduction: AROM, Both, 5 reps Straight Leg Raises: AROM,  Both, 5 reps    General Comments        Pertinent Vitals/Pain Pain Assessment Pain Assessment: No/denies pain    Home Living                          Prior Function             PT Goals (current goals can now be found in the care plan section) Acute Rehab PT Goals Patient Stated Goal: to go home PT Goal Formulation: With patient Time For Goal Achievement: 09/10/23 Potential to Achieve Goals: Fair Progress towards PT goals: Progressing toward goals    Frequency    Min 1X/week      PT Plan      Co-evaluation              AM-PAC PT 6 Clicks Mobility   Outcome Measure  Help needed turning from your back to your side while in a flat bed without using bedrails?: A Little Help needed moving from lying on your back to sitting on the side of a flat bed without using bedrails?: A Little Help needed moving to and from a bed to a chair (including a wheelchair)?: A Little Help needed standing up from a chair using your arms (e.g., wheelchair or bedside chair)?: A Little Help needed to walk in hospital room?: A Little Help needed climbing 3-5 steps with a railing? : A Little 6 Click Score: 18    End of Session Equipment Utilized During Treatment: Oxygen ;Gait belt Activity Tolerance: Patient limited by fatigue;Patient limited by lethargy Patient left: in chair;with call bell/phone within reach;with chair alarm set Nurse Communication: Mobility status;Other (comment) (O2 needs) PT Visit Diagnosis: Muscle weakness (generalized) (M62.81);Unsteadiness on feet (R26.81);Difficulty in walking, not elsewhere classified (R26.2)     Time: 8652-8592 PT Time Calculation (min) (ACUTE ONLY): 20 min  Charges:    $Gait Training: 8-22 mins PT General Charges $$ ACUTE PT VISIT: 1 Visit                     Lawson Isabell, PT, GCS 09/01/23,2:31 PM

## 2023-09-01 NOTE — Progress Notes (Signed)
 SaO2 on room air at rest = 90% SaO2 on room air while ambulating = 84% SaO2 on 2 liters of O2 while ambulating = 91%   Teneisha Gignac, PT, GCS 09/01/23,2:36 PM

## 2023-09-01 NOTE — Plan of Care (Signed)

## 2023-09-01 NOTE — TOC Progression Note (Signed)
 Transition of Care [facility]) - Progression Note    Patient Details  Name: Audrey Duran MRN: [id number] Date of Birth: 10-12-1934  Transition of [facility]) CM/SW Contact  Ladene Lady, LCSW Phone Number: 09/01/2023, 8:36 AM  Clinical Narrative:   Attempting to wean patient off oxygen  for discharge back to the [facility]. TOC following.    Expected Discharge Plan: Home w Home Health Services Barriers to Discharge: Continued Medical Work up  Expected Discharge Plan and Services     Post Acute Care Choice: Home Health Living arrangements for the past 2 months: Assisted Living Facility                             [facility] Agency: [facility]     Representative spoke with at [facility] Agency: sheila   Social Determinants of Health (SDOH) Interventions SDOH Screenings   Food Insecurity: No Food Insecurity (08/26/2023)  Housing: Low Risk  (08/26/2023)  Transportation Needs: No Transportation Needs (08/26/2023)  Utilities: Not At Risk (08/26/2023)  Depression (PHQ2-9): Low Risk  (10/02/2022)  Financial Resource Strain: Low Risk  (12/03/2020)  Social Connections: Moderately Isolated (08/26/2023)  Stress: No Stress Concern Present (12/03/2020)  Tobacco Use: Medium Risk (08/26/2023)    Readmission Risk Interventions     No data to display

## 2023-09-01 NOTE — Progress Notes (Addendum)
 Mobility Specialist - Progress Note   09/01/23 1429  Mobility  Activity Transferred from bed to chair;Ambulated with assistance in room  Level of Assistance Contact guard assist, steadying assist  Assistive Device Front wheel walker  Distance Ambulated (ft) 4 ft  Activity Response Tolerated well  Mobility visit 1 Mobility  Mobility Specialist Start Time (ACUTE ONLY) 1148  Mobility Specialist Stop Time (ACUTE ONLY) 1205  Mobility Specialist Time Calculation (min) (ACUTE ONLY) 17 min   Pt supine upon entry, utilizing 3L [place]. Pt agreeable to transfer to the recliner this date, requiring extra time to process and answer questions this date. Pt completed bed mob ModI, HOB lower to assist with trunk support once seated EOB. Pt STS to RW MinA, SPT to the recliner CGA-MinG. Pt left seated in the recliner with alarm set and needs within reach.  America Silvan Mobility Specialist 09/01/23 2:33 PM

## 2023-09-02 DIAGNOSIS — J9601 Acute respiratory failure with hypoxia: Secondary | ICD-10-CM | POA: Diagnosis not present

## 2023-09-02 DIAGNOSIS — K529 Noninfective gastroenteritis and colitis, unspecified: Secondary | ICD-10-CM | POA: Diagnosis not present

## 2023-09-02 LAB — GLUCOSE, CAPILLARY
Glucose-Capillary: 125 mg/dL — ABNORMAL HIGH (ref 70–99)
Glucose-Capillary: 148 mg/dL — ABNORMAL HIGH (ref 70–99)
Glucose-Capillary: 100 mg/dL — ABNORMAL HIGH (ref 70–99)
Glucose-Capillary: 105 mg/dL — ABNORMAL HIGH (ref 70–99)

## 2023-09-02 NOTE — Progress Notes (Signed)
 PROGRESS NOTE Audrey Duran  FMW:[id number]  DOB: [birth year]  DOA: 08/24/2023 PCP: Marylynn Verneita LITTIE, MD  Hospital course:  89 year old female, remote former smoker, with dementia, CAD, PAF, CKD IIIa and HFpEF, EF 50% in 2017 who was admitted for nausea, vomiting and diarrhea thought to be secondary to gastroenteritis as well as AKI.  Patient was hydrated however subsequently developed respiratory distress requiring BiPAP.  Patient was transferred to the ICU where she was diuresed and treated with steroids and temporarily did require some peripheral Levophed .  off of pressors. Her gastroenteritis has resolved.  Now euvolemic and respiratory status stable at rest but still desaturating with exertion. Continue to wean O2 and use incentive spirometer.   She is resting ORA without discomfort or desat but easily desats with minimal exertion. If she is unable to wean from O2 by tomorrow I recommend she be discharged home with oxygen  to be given as needed with exertion.   Subjective: Patient feels tired. No SOB at rest. Easily dyspneic with minimal exertion.   Objective: Blood pressure (!) 132/55, pulse 63, temperature 98.2 F (36.8 C), resp. rate 16, height 5' 3 (1.6 m), weight 60.2 kg, SpO2 (!) 80%.  Exam: General: Patient sitting up in chair, NAD Eyes: sclera anicteric, conjuctiva mild injection bilaterally CVS: S1-S2, regular  Respiratory: normal WOB. No wheezing or rales. LE: Warm and well-perfused. No edema Neuro: alert and conversationally appropriate. Able to follow commands. Asks about boys playing in the windows across from hers and not sure if she actually saw them or was delusion. She was otherwise appropriate.   Data Reviewed:  Basic Metabolic Panel: Recent Labs  Lab 08/27/23 0421 08/28/23 0356 08/29/23 0525 08/30/23 0507 08/31/23 0931  NA 136 136 138 139 138  K 4.5 4.7 3.6 3.3* 4.1  CL 105 103 106 107 107  CO2 23 22 21* 23 20*  GLUCOSE 112* 110* 122* 107* 114*   BUN 43* 55* 67* 66* 47*  CREATININE 1.79* 1.72* 1.67* 1.41* 1.27*  CALCIUM  8.6* 8.7* 9.1 8.8* 8.9  MG 2.3 2.4  --   --   --   PHOS 3.5 3.7  --   --   --    CBC: Recent Labs  Lab 08/27/23 0421 08/28/23 0356 08/29/23 0525 08/30/23 0507  WBC 16.1* 13.3* 10.4 11.3*  HGB 8.8* 8.7* 8.9* 8.7*  HCT 27.1* 26.7* 27.9* 27.3*  MCV 94.4 94.3 94.3 95.1  PLT 129* 133* 129* 134*   Assessment & Plan: Acute hypoxic respiratory failure--improving. No baseline oxygen  requirement.  Possible aspiration pneumonia Decompensated HFpEF, EF 50 to 55% on echocardiogram 08/25/2023 Patient doing well after BiPAP support in ICU Completed antibiotic and steroid treatment.  S/p diuresis and appears to be euvolemic at this time.  She is able to remain off oxygen  support at rest today. Still desats with exertion.  - continue OOB as much as possible and PT/OT - incentive spirometer/fluter valve q1hr while awake.  - improvement with sudafed as well as mucinex  for pm coughing - wean to room air as tolerated with ambulation but if not able, she should be stable to dc with O2 PRN and follow up outpatient.   Nausea vomiting diarrhea- resolved Likely secondary to acute gastroenteritis  HTN- restarted home medications and has low-normal BP so stopping her home lisinopril . (Systolics close to 100)  CKD 3- Creatinine is at baseline Avoid nephrotoxic agents for further intravascular volume depletion  Dementia  depression- baseline - restarting home meds  Celexa , remeron , risperdal   DVT prophylaxis: Lovenox  Code Status: DNR Family Communication: Patient's daughter on phone  Studies: DG Chest Port 1 View Result Date: 09/01/2023 CLINICAL DATA:  Shortness of breath EXAM: PORTABLE CHEST 1 VIEW COMPARISON:  08/26/2023 FINDINGS: Cardiomegaly. Bibasilar heterogeneous airspace opacity and probable small layering pleural effusions. No acute osseous findings. IMPRESSION: 1. Cardiomegaly. 2. Bibasilar heterogeneous  airspace opacity and probable small layering pleural effusions, consistent with edema or infection. Electronically Signed   By: Marolyn JONETTA Jaksch M.D.   On: 09/01/2023 11:38    Principal Problem:   Acute gastroenteritis Active Problems:   Hypotension   Acute kidney injury superimposed on chronic kidney disease (HCC)   Cough   Coronary artery disease   Paroxysmal atrial fibrillation (HCC)   Hyperkalemia   Essential hypertension   Dementia with agitation (HCC)   (HFpEF) heart failure with preserved ejection fraction (HCC)   Acute hypoxic respiratory failure (HCC)   Cloris Flippo LITTIE Piety, [facility]  If 7PM-7AM, please contact night-coverage [email or web address]   LOS: 8 days

## 2023-09-02 NOTE — TOC Progression Note (Signed)
 Transition of Care [facility]) - Progression Note    Patient Details  Name: Audrey Duran MRN: [id number] Date of Birth: 1934-10-13  Transition of Care [facility]) CM/SW Contact  Ladene Lady, LCSW Phone Number: 09/02/2023, 9:16 AM  Clinical Narrative:   Pt is still on oxygen , we are attempting to wean before discharge.    Expected Discharge Plan: Home w Home Health Services Barriers to Discharge: Continued Medical Work up  Expected Discharge Plan and Services     Post Acute Care Choice: Home Health Living arrangements for the past 2 months: Assisted Living Facility                             [facility] Agency: [facility]     Representative spoke with at [facility] Agency: sheila   Social Determinants of Health (SDOH) Interventions SDOH Screenings   Food Insecurity: No Food Insecurity (08/26/2023)  Housing: Low Risk  (08/26/2023)  Transportation Needs: No Transportation Needs (08/26/2023)  Utilities: Not At Risk (08/26/2023)  Depression (PHQ2-9): Low Risk  (10/02/2022)  Financial Resource Strain: Low Risk  (12/03/2020)  Social Connections: Moderately Isolated (08/26/2023)  Stress: No Stress Concern Present (12/03/2020)  Tobacco Use: Medium Risk (08/26/2023)    Readmission Risk Interventions     No data to display

## 2023-09-02 NOTE — Plan of Care (Signed)

## 2023-09-02 NOTE — Progress Notes (Signed)
 Mobility Specialist - Progress Note   09/02/23 1136  Mobility  Activity Ambulated with assistance in hallway  Level of Assistance Contact guard assist, steadying assist  Assistive Device Front wheel walker  Distance Ambulated (ft) 60 ft  Activity Response Tolerated well  Mobility visit 1 Mobility  Mobility Specialist Start Time (ACUTE ONLY) 1010  Mobility Specialist Stop Time (ACUTE ONLY) 1040  Mobility Specialist Time Calculation (min) (ACUTE ONLY) 30 min   Nurse requested Mobility Specialist to perform oxygen  saturation test with pt which includes removing pt from oxygen  both at rest and while ambulating.  Below are the results from that testing.     Patient Saturations on Room Air at Rest = spO2 92%  Patient Saturations on Room Air while Ambulating = sp02 83%. Rested and performed pursed lip breathing for 1 minute with sp02 at 81%.  Patient Saturations on 4 Liters of oxygen  while Ambulating = sp02 96%  At end of testing pt left in room on RA= 91%  Reported results to nurse.   Pt sitting in the recliner upon entry, utilizing RA -- O2 >90% HR 80. Pt STS to RW ModA, immediate desat to 83%, preforms PLB unable to rise to/above 88%. Pt placed on 2L [place], O2 92%. Pt amb 60 ft in the hallway CGA-MinG, ~10 ft into amb Pt desat to 86%-- placed on 4L [place], O2 98% HR 105. Pt denied SOB or dizziness, reporting only feeling tired after amb. Pt returned to the room, left supine with alarm set and needs within reach. RN notified.  America Silvan Mobility Specialist 09/02/23 11:54 AM

## 2023-09-03 DIAGNOSIS — K529 Noninfective gastroenteritis and colitis, unspecified: Secondary | ICD-10-CM | POA: Diagnosis not present

## 2023-09-03 LAB — CBC
HCT: 28.6 % — ABNORMAL LOW (ref 36.0–46.0)
Hemoglobin: 8.8 g/dL — ABNORMAL LOW (ref 12.0–15.0)
MCH: 29.9 pg (ref 26.0–34.0)
MCHC: 30.8 g/dL (ref 30.0–36.0)
MCV: 97.3 fL (ref 80.0–100.0)
Platelets: 163 10*3/uL (ref 150–400)
RBC: 2.94 MIL/uL — ABNORMAL LOW (ref 3.87–5.11)
RDW: 13.3 % (ref 11.5–15.5)
WBC: 10.7 10*3/uL — ABNORMAL HIGH (ref 4.0–10.5)
nRBC: 0 % (ref 0.0–0.2)

## 2023-09-03 LAB — MAGNESIUM: Magnesium: 1.5 mg/dL — ABNORMAL LOW (ref 1.7–2.4)

## 2023-09-03 LAB — BASIC METABOLIC PANEL
Anion gap: 10 (ref 5–15)
BUN: 31 mg/dL — ABNORMAL HIGH (ref 8–23)
CO2: 25 mmol/L (ref 22–32)
Calcium: 8.7 mg/dL — ABNORMAL LOW (ref 8.9–10.3)
Chloride: 104 mmol/L (ref 98–111)
Creatinine, Ser: 1.33 mg/dL — ABNORMAL HIGH (ref 0.44–1.00)
GFR, Estimated: 39 mL/min — ABNORMAL LOW (ref >60)
Glucose, Bld: 144 mg/dL — ABNORMAL HIGH (ref 70–99)
Potassium: 3.5 mmol/L (ref 3.5–5.1)
Sodium: 139 mmol/L (ref 135–145)

## 2023-09-03 LAB — GLUCOSE, CAPILLARY
Glucose-Capillary: 83 mg/dL (ref 70–99)
Glucose-Capillary: 114 mg/dL — ABNORMAL HIGH (ref 70–99)

## 2023-09-03 LAB — PHOSPHORUS: Phosphorus: 3.5 mg/dL (ref 2.5–4.6)

## 2023-09-03 NOTE — Discharge Summary (Signed)
 [facility] Discharge Summary   Patient: Audrey Duran FMW:[id number]  PCP: Marylynn Verneita LITTIE, MD  Date of admission: 08/24/2023   Date of discharge:  09/03/2023     Discharge Diagnoses:  Principal Problem:   Acute gastroenteritis Active Problems:   Hypotension   Acute kidney injury superimposed on chronic kidney disease (HCC)   Cough   Coronary artery disease   Paroxysmal atrial fibrillation (HCC)   Hyperkalemia   Essential hypertension   Dementia with agitation (HCC)   (HFpEF) heart failure with preserved ejection fraction (HCC)   Acute hypoxic respiratory failure (HCC)   Admitted From: ALF Disposition:  ALF/ILF   Recommendations for Outpatient Follow-up:  Follow-up with PCP, patient should be seen by an MD in 1 to 2 days.  Monitor BP and titrate medication accordingly. Discontinued lisinopril  and telmisartan , did not know why patient was on both Follow up LABS/TEST:  as above   Follow-up Information     Marylynn Verneita LITTIE, MD Follow up.   Specialty: Internal Medicine Why: Hospital follow up Contact information: [address] [place] [address] [phone]                Diet recommendation: Cardiac diet  Activity: The patient is advised to gradually reintroduce usual activities, as tolerated  Discharge Condition: stable  Code Status: DNR /DNI-Limited  History of present illness: As per the H and P dictated on admission Hospital Course:  89 year old female, remote former smoker, with dementia, CAD, PAF, CKD IIIa and HFpEF, EF 50% in 2017 who was admitted for nausea, vomiting and diarrhea thought to be secondary to gastroenteritis as well as AKI.  Patient was hydrated however subsequently developed respiratory distress requiring BiPAP.  Patient was transferred to the ICU where she was diuresed and treated with steroids and temporarily did require some peripheral Levophed .  off of pressors. Her gastroenteritis has resolved.  Now  euvolemic and respiratory status stable at rest but still desaturating with exertion. Continue to wean O2 and use incentive spirometer.    She is resting ORA without discomfort or desat but easily desats with minimal exertion. If she is unable to wean from O2 by tomorrow I recommend she be discharged home with oxygen  to be given as needed with exertion.  Assessment & Plan:  # Acute hypoxic respiratory failure: Still has hypoxia, requiring supplemental O2 inhalation 2 to 3 L.  Gradually wean off as per improvement. # Possible aspiration pneumonia # Decompensated HFpEF, EF 50 to 55% on echocardiogram 08/25/2023 Patient doing well after BiPAP support in ICU, Completed antibiotic and steroid treatment. S/p diuresis and appears to be euvolemic at this time.  continue OOB as much as possible and PT/OT incentive spirometer/fluter valve q1hr while awake.  improvement with sudafed as well as mucinex  for pm coughing wean to room air as tolerated with ambulation.  Portable supplemental O2 inhalation arranged by TOC.   # Nausea vomiting diarrhea- resolved Likely secondary to acute gastroenteritis # HTN-blood pressure remained soft, DC'd home meds.  Monitor BP and titrate medications accordingly. # CKD 3- Creatinine is at baseline Avoid nephrotoxic agents for further intravascular volume depletion # Dementia  depression- baseline, Resumed home meds Celexa , remeron , risperdal   Body mass index is 22.88 kg/m.  Nutrition Interventions:  Patient was seen by physical therapy, who recommended Home health, which was arranged. On the day of the discharge the patient's vitals were stable, and no other acute medical condition were reported by patient. the patient was felt safe  to be discharge at ALF/ILF with Home health.  Consultants: PCCM, pulmonologist Procedures: None  Discharge Exam: General: Appear in no distress, no Rash; Oral Mucosa Clear, moist. Cardiovascular: S1 and S2 Present, no  Murmur, Respiratory: normal respiratory effort, Bilateral Air entry present and mild Crackles, no wheezes Abdomen: Bowel Sound present, Soft and no tenderness, no hernia Extremities: no Pedal edema, no calf tenderness Neurology: AAO x 3, no focal deficits affect appropriate.  Filed Weights   09/01/23 0500 09/02/23 0136 09/03/23 0500  Weight: 60.2 kg 59.4 kg 58.6 kg   Vitals:   09/03/23 0425 09/03/23 0741  BP: (!) 106/41 (!) 107/30  Pulse: 64 (!) 55  Resp: 16 16  Temp: 99.7 F (37.6 C) 97.7 F (36.5 C)  SpO2: 92% 97%    DISCHARGE MEDICATION: Allergies as of 09/03/2023       Reactions   Azithromycin    GI upset   Clarithromycin    ? Rash   Diclofenac    ? Rash   Lisinopril  Other (See Comments)   hyperkalemia   Meloxicam    ? Rash   Nitrofurantoin Monohyd Macro    ? rash   Zocor [simvastatin]    Penicillins Rash        Medication List     STOP taking these medications    cyanocobalamin  1000 MCG tablet   HYDROcodone -acetaminophen  5-325 MG tablet Commonly known as: NORCO/VICODIN   lisinopril  5 MG tablet Commonly known as: ZESTRIL    LORazepam  0.5 MG tablet Commonly known as: ATIVAN    telmisartan  20 MG tablet Commonly known as: MICARDIS        TAKE these medications    acetaminophen  325 MG tablet Commonly known as: TYLENOL  Take by mouth every 4 (four) hours as needed.   acyclovir  400 MG tablet Commonly known as: ZOVIRAX  Take 400 mg by mouth daily.   alendronate  70 MG tablet Commonly known as: FOSAMAX  Take 1 tablet by mouth every 7 days on an empty stomach with a full glass of water   alum & mag hydroxide-simeth 200-200-20 MG/5ML suspension Commonly known as: MAALOX/MYLANTA Take 30 mLs by mouth every 6 (six) hours as needed for indigestion or heartburn.   aspirin  81 MG tablet Take 81 mg by mouth at bedtime.   citalopram  20 MG tablet Commonly known as: CELEXA  Take 1 tablet (20 mg total) by mouth daily.   furosemide  20 MG tablet Commonly  known as: LASIX  Take 20 mg by mouth daily.   guaifenesin  100 MG/5ML syrup Commonly known as: ROBITUSSIN Take 300 mg by mouth 4 (four) times daily as needed for cough.   lidocaine  5 % ointment Commonly known as: XYLOCAINE  Apply 1 Application topically as needed.   loperamide 2 MG capsule Commonly known as: IMODIUM Take 4 mg by mouth as needed for diarrhea or loose stools.   magnesium  hydroxide 400 MG/5ML suspension Commonly known as: MILK OF MAGNESIA Take 30 mLs by mouth daily as needed for mild constipation.   magnesium  oxide 400 MG tablet Commonly known as: MAG-OX Take 400 mg by mouth daily.   mirtazapine  7.5 MG tablet Commonly known as: REMERON  Take 1 tablet (7.5 mg total) by mouth at bedtime.   risperiDONE  0.25 MG tablet Commonly known as: RISPERDAL  Take 1 tablet (0.25 mg total) by mouth at bedtime. What changed: when to take this   rosuvastatin  20 MG tablet Commonly known as: Crestor  Take 1 tablet (20 mg total) by mouth daily. What changed: when to take this   timolol  0.5 %  ophthalmic solution Commonly known as: TIMOPTIC  Place 1 drop into both eyes 2 (two) times daily.   Vitamin D3 75 MCG (3000 UT) Tabs Take 1 tablet by mouth daily. Take one by mouth daily               Durable Medical Equipment  (From admission, onward)           Start     Ordered   09/03/23 1056  For home use only DME oxygen   Once       Question Answer Comment  Length of Need 6 Months   Mode or (Route) Nasal cannula   Liters per Minute 3   Oxygen  conserving device Yes   Oxygen  delivery system Gas      09/03/23 1055           Allergies  Allergen Reactions   Azithromycin     GI upset   Clarithromycin     ? Rash   Diclofenac     ? Rash   Lisinopril  Other (See Comments)    hyperkalemia   Meloxicam     ? Rash   Nitrofurantoin Monohyd Macro     ? rash   Zocor [Simvastatin]    Penicillins Rash   Discharge Instructions     Call MD for:  difficulty breathing,  headache or visual disturbances   Complete by: As directed    Call MD for:  extreme fatigue   Complete by: As directed    Call MD for:  persistant dizziness or light-headedness   Complete by: As directed    Call MD for:  persistant nausea and vomiting   Complete by: As directed    Call MD for:  severe uncontrolled pain   Complete by: As directed    Call MD for:  temperature >100.4   Complete by: As directed    Diet - low sodium heart healthy   Complete by: As directed    Discharge instructions   Complete by: As directed    Follow-up with PCP, patient should be seen by an MD in 1 to 2 days.  Monitor BP and titrate medication accordingly. Discontinued lisinopril  and telmisartan , did not know why patient was on both   Increase activity slowly   Complete by: As directed        The results of significant diagnostics from this hospitalization (including imaging, microbiology, ancillary and laboratory) are listed below for reference.    Significant Diagnostic Studies: DG Chest Port 1 View Result Date: 09/01/2023 CLINICAL DATA:  Shortness of breath EXAM: PORTABLE CHEST 1 VIEW COMPARISON:  08/26/2023 FINDINGS: Cardiomegaly. Bibasilar heterogeneous airspace opacity and probable small layering pleural effusions. No acute osseous findings. IMPRESSION: 1. Cardiomegaly. 2. Bibasilar heterogeneous airspace opacity and probable small layering pleural effusions, consistent with edema or infection. Electronically Signed   By: Marolyn JONETTA Jaksch M.D.   On: 09/01/2023 11:38   ECHOCARDIOGRAM COMPLETE Result Date: 08/26/2023    ECHOCARDIOGRAM REPORT   Patient Name:   EVANGALINE JOU Date of Exam: 08/25/2023 Medical Rec #:  [id number]         Height:       63.0 in Accession #:    [id number]        Weight:       130.0 lb Date of Birth:  September 22, 1934        BSA:          1.610 m Patient Age:    89 years  BP:           86/35 mmHg Patient Gender: F                 HR:           89 bpm. Exam Location:  [facility]  Procedure: 2D Echo, Cardiac Doppler and Color Doppler Indications:     I42.9 Cardiomyopathy  History:         Patient has no prior history of Echocardiogram examinations.                  CAD and Previous Myocardial Infarction, COPD, Arrythmias:Atrial                  Fibrillation; Risk Factors:Dyslipidemia. Ischemic                  cardiomyopathy.  Sonographer:     Carl Coma RDCS Referring Phys:  [id number] CARMEN L GONZALEZ Diagnosing Phys: Marsa Dooms MD IMPRESSIONS  1. Left ventricular ejection fraction, by estimation, is 50 to 55%. The left ventricle has low normal function. The left ventricle has no regional wall motion abnormalities. Left ventricular diastolic parameters are consistent with Grade I diastolic dysfunction (impaired relaxation).  2. Right ventricular systolic function is normal. The right ventricular size is normal.  3. The mitral valve is normal in structure. Mild mitral valve regurgitation. No evidence of mitral stenosis.  4. Tricuspid valve regurgitation is moderate.  5. The aortic valve is normal in structure. Aortic valve regurgitation is not visualized. No aortic stenosis is present.  6. The inferior vena cava is normal in size with greater than 50% respiratory variability, suggesting right atrial pressure of 3 mmHg. FINDINGS  Left Ventricle: Left ventricular ejection fraction, by estimation, is 50 to 55%. The left ventricle has low normal function. The left ventricle has no regional wall motion abnormalities. The left ventricular internal cavity size was normal in size. There is no left ventricular hypertrophy. Left ventricular diastolic parameters are consistent with Grade I diastolic dysfunction (impaired relaxation). Right Ventricle: The right ventricular size is normal. No increase in right ventricular wall thickness. Right ventricular systolic function is normal. Left Atrium: Left atrial size was normal in size. Right Atrium: Right atrial size was normal in size.  Pericardium: There is no evidence of pericardial effusion. Mitral Valve: The mitral valve is normal in structure. Mild mitral valve regurgitation. No evidence of mitral valve stenosis. Tricuspid Valve: The tricuspid valve is normal in structure. Tricuspid valve regurgitation is moderate . No evidence of tricuspid stenosis. Aortic Valve: The aortic valve is normal in structure. Aortic valve regurgitation is not visualized. No aortic stenosis is present. Pulmonic Valve: The pulmonic valve was normal in structure. Pulmonic valve regurgitation is not visualized. No evidence of pulmonic stenosis. Aorta: The aortic root is normal in size and structure. Venous: The inferior vena cava is normal in size with greater than 50% respiratory variability, suggesting right atrial pressure of 3 mmHg. IAS/Shunts: No atrial level shunt detected by color flow Doppler.  LEFT VENTRICLE PLAX 2D LVIDd:         4.10 cm   Diastology LVIDs:         3.10 cm   LV e' medial:    5.87 cm/s LV PW:         0.90 cm   LV E/e' medial:  9.6 LV IVS:        0.90 cm   LV e' lateral:   7.94 cm/s LVOT diam:  1.80 cm   LV E/e' lateral: 7.1 LV SV:         41 LV SV Index:   26 LVOT Area:     2.54 cm  RIGHT VENTRICLE             IVC RV Basal diam:  3.30 cm     IVC diam: 1.00 cm RV S prime:     13.30 cm/s TAPSE (M-mode): 2.9 cm LEFT ATRIUM             Index        RIGHT ATRIUM           Index LA diam:        3.40 cm 2.11 cm/m   RA Area:     10.70 cm LA Vol (A2C):   30.4 ml 18.88 ml/m  RA Volume:   24.10 ml  14.97 ml/m LA Vol (A4C):   20.6 ml 12.79 ml/m LA Biplane Vol: 25.3 ml 15.71 ml/m  AORTIC VALVE LVOT Vmax:   94.67 cm/s LVOT Vmean:  65.233 cm/s LVOT VTI:    0.163 m  AORTA Ao Root diam: 3.20 cm Ao Asc diam:  3.30 cm MITRAL VALVE               TRICUSPID VALVE MV Area (PHT): 3.42 cm    TR Peak grad:   32.5 mmHg MV Decel Time: 222 msec    TR Vmax:        285.00 cm/s MV E velocity: 56.10 cm/s MV A velocity: 91.45 cm/s  SHUNTS MV E/A ratio:  0.61         Systemic VTI:  0.16 m                            Systemic Diam: 1.80 cm Marsa Dooms MD Electronically signed by Marsa Dooms MD Signature Date/Time: 08/26/2023/1:41:21 PM    Final    DG Chest Port 1 View Result Date: 08/26/2023 CLINICAL DATA:  89 year old female with history of acute hypoxic respiratory failure. EXAM: PORTABLE CHEST 1 VIEW COMPARISON:  Chest x-ray 08/25/2023. FINDINGS: Lung volumes are low. Elevation of the right hemidiaphragm. Bibasilar opacities (right greater than left) which may reflect areas of atelectasis and/or consolidation, with superimposed moderate right and small left pleural effusions. No pneumothorax. No evidence of pulmonary edema. Heart size is borderline enlarged. Upper mediastinal contours are within normal limits. Atherosclerotic calcifications in the thoracic aorta. IMPRESSION: 1. Low lung volumes with bibasilar areas of atelectasis and/or consolidation (right-greater-than-left) with superimposed moderate right and small left pleural effusions. 2. Aortic atherosclerosis. Electronically Signed   By: Toribio Aye M.D.   On: 08/26/2023 06:01   DG Chest Port 1 View Result Date: 08/25/2023 CLINICAL DATA:  Shortness breath. EXAM: PORTABLE CHEST 1 VIEW COMPARISON:  Chest radiograph dated August 24, 2023. FINDINGS: The heart size is upper limits of normal. Aortic atherosclerosis. Mild streaky bibasilar opacities. No sizable pleural effusion. No pneumothorax. Osteopenia. Visualized osseous structures are unchanged. IMPRESSION: Mild streaky bibasilar opacities could reflect atelectasis or infiltrate. Electronically Signed   By: Harrietta Sherry M.D.   On: 08/25/2023 08:39   CT ABDOMEN PELVIS WO CONTRAST Result Date: 08/24/2023 CLINICAL DATA:  Sepsis.  Nausea vomiting and diarrhea. EXAM: CT ABDOMEN AND PELVIS WITHOUT CONTRAST TECHNIQUE: Multidetector CT imaging of the abdomen and pelvis was performed following the standard protocol without IV contrast. RADIATION  DOSE REDUCTION: This exam was performed according to the departmental dose-optimization program  which includes automated exposure control, adjustment of the mA and/or kV according to patient size and/or use of iterative reconstruction technique. COMPARISON:  CT abdomen pelvis dated 03/05/2023. FINDINGS: Evaluation of this exam is limited in the absence of intravenous contrast as well as due to respiratory motion. Lower chest: Right lower lobe reticular and patchy opacity consistent with developing infiltrate or aspiration. There is coronary vascular calcification. No intra-abdominal free air or free fluid. Hepatobiliary: The liver is unremarkable. No biliary dilatation. Layering stone in the gallbladder. No pericholecystic fluid or evidence of acute cholecystitis by CT. Pancreas: Unremarkable. No pancreatic ductal dilatation or surrounding inflammatory changes. Spleen: Normal in size without focal abnormality. Adrenals/Urinary Tract: The adrenal glands are unremarkable. Mild bilateral renal parenchyma atrophy. There is no hydronephrosis or nephrolithiasis on either side. Small right renal upper pole hypodense lesion not characterized on this CT but similar to prior CT most consistent with a cyst. The visualized ureters and urinary bladder appear unremarkable. Stomach/Bowel: There is sigmoid diverticulosis. There is no bowel obstruction or active inflammation. Appendectomy. Vascular/Lymphatic: Advanced aortoiliac atherosclerotic disease. The IVC is unremarkable. No portal venous gas. There is no adenopathy. Reproductive: Hysterectomy.  No suspicious adnexal masses. Other: None Musculoskeletal: Osteopenia with degenerative changes of the spine. Progression of T11 compression fracture since the prior CT with near complete loss of vertebral body height centrally. Correlation with clinical exam and point tenderness recommended. Right femoral neck screw. IMPRESSION: 1. No acute intra-abdominal or pelvic pathology. 2.  Sigmoid diverticulosis. No bowel obstruction. 3. Cholelithiasis. 4. Right lower lobe developing infiltrate or aspiration. 5. Progression of T11 compression fracture since the prior CT. Correlation with clinical exam and point tenderness recommended. 6.  Aortic Atherosclerosis (ICD10-I70.0). Electronically Signed   By: Vanetta Chou M.D.   On: 08/24/2023 14:12   DG Chest 2 View Result Date: 08/24/2023 CLINICAL DATA:  Sepsis suspected. EXAM: CHEST - 2 VIEW COMPARISON:  Chest radiograph dated 03/05/2023. FINDINGS: No focal consolidation, pleural effusion, pneumothorax. The cardiac silhouette is within normal limits. Atherosclerotic calcification of the aorta. Osteopenia with degenerative changes of the spine. Chronic appearing lower thoracic compression fracture with anterior wedging appears progressed since the prior radiograph. Correlation with clinical exam and point tenderness recommended. IMPRESSION: 1. No active cardiopulmonary disease. 2. Progression of lower thoracic compression fracture. Electronically Signed   By: Vanetta Chou M.D.   On: 08/24/2023 13:22    Microbiology: Recent Results (from the past 240 hours)  SARS Coronavirus 2 by RT PCR (hospital order, performed in [facility] hospital lab) *cepheid single result test* Anterior Nasal Swab     Status: None   Collection Time: 08/24/23  3:17 PM   Specimen: Anterior Nasal Swab  Result Value Ref Range Status   SARS Coronavirus 2 by RT PCR NEGATIVE NEGATIVE Final    Comment: (NOTE) SARS-CoV-2 target nucleic acids are NOT DETECTED.  The SARS-CoV-2 RNA is generally detectable in upper and lower respiratory specimens during the acute phase of infection. The lowest concentration of SARS-CoV-2 viral copies this assay can detect is 250 copies / mL. A negative result does not preclude SARS-CoV-2 infection and should not be used as the sole basis for treatment or other patient management decisions.  A negative result may occur  with improper specimen collection / handling, submission of specimen other than nasopharyngeal swab, presence of viral mutation(s) within the areas targeted by this assay, and inadequate number of viral copies (<250 copies / mL). A negative result must be combined with clinical observations, patient history,  and epidemiological information.  Fact Sheet for Patients:   roadlaptop.co.za  Fact Sheet for Healthcare Providers: http://kim-miller.com/  This test is not yet approved or  cleared by the [place]  FDA and has been authorized for detection and/or diagnosis of SARS-CoV-2 by FDA under an Emergency Use Authorization (EUA).  This EUA will remain in effect (meaning this test can be used) for the duration of the COVID-19 declaration under Section 564(b)(1) of the Act, 21 U.S.C. section 360bbb-3(b)(1), unless the authorization is terminated or revoked sooner.  Performed at [facility], [address] [place], [place] [address]   Respiratory (~20 pathogens) panel by PCR     Status: None   Collection Time: 08/25/23  2:15 PM  Result Value Ref Range Status   Adenovirus NOT DETECTED NOT DETECTED Final   Coronavirus 229E NOT DETECTED NOT DETECTED Final    Comment: (NOTE) The Coronavirus on the Respiratory Panel, DOES NOT test for the novel  Coronavirus (2019 nCoV)    Coronavirus HKU1 NOT DETECTED NOT DETECTED Final   Coronavirus NL63 NOT DETECTED NOT DETECTED Final   Coronavirus OC43 NOT DETECTED NOT DETECTED Final   Metapneumovirus NOT DETECTED NOT DETECTED Final   Rhinovirus / Enterovirus NOT DETECTED NOT DETECTED Final   Influenza A NOT DETECTED NOT DETECTED Final   Influenza B NOT DETECTED NOT DETECTED Final   Parainfluenza Virus 1 NOT DETECTED NOT DETECTED Final   Parainfluenza Virus 2 NOT DETECTED NOT DETECTED Final   Parainfluenza Virus 3 NOT DETECTED NOT DETECTED Final   Parainfluenza Virus 4 NOT DETECTED NOT  DETECTED Final   Respiratory Syncytial Virus NOT DETECTED NOT DETECTED Final   Bordetella pertussis NOT DETECTED NOT DETECTED Final   Bordetella Parapertussis NOT DETECTED NOT DETECTED Final   Chlamydophila pneumoniae NOT DETECTED NOT DETECTED Final   Mycoplasma pneumoniae NOT DETECTED NOT DETECTED Final    Comment: Performed at [facility], [address], [place], [place] [address]  Culture, blood (Routine x 2)     Status: None   Collection Time: 08/26/23 12:14 AM   Specimen: BLOOD  Result Value Ref Range Status   Specimen Description BLOOD LEFT HAND  Final   Special Requests   Final    BOTTLES DRAWN AEROBIC AND ANAEROBIC Blood Culture results may not be optimal due to an inadequate volume of blood received in culture bottles   Culture   Final    NO GROWTH 5 DAYS Performed at [facility], [address], [place], [place] [address]    Report Status 08/31/2023 FINAL  Final  MRSA Next Gen by PCR, Nasal     Status: None   Collection Time: 08/26/23  8:34 PM   Specimen: Nasal Mucosa; Nasal Swab  Result Value Ref Range Status   MRSA by PCR Next Gen NOT DETECTED NOT DETECTED Final    Comment: (NOTE) The GeneXpert MRSA Assay (FDA approved for NASAL specimens only), is one component of a comprehensive MRSA colonization surveillance program. It is not intended to diagnose MRSA infection nor to guide or monitor treatment for MRSA infections. Test performance is not FDA approved in patients less than 52 years old. Performed at [facility], [address] [place], [place] [address]      Labs: CBC: Recent Labs  Lab 08/28/23 [phone] 08/29/23 0525 08/30/23 0507 09/03/23 0927  WBC 13.3* 10.4 11.3* 10.7*  HGB 8.7* 8.9* 8.7* 8.8*  HCT 26.7* 27.9* 27.3* 28.6*  MCV 94.3 94.3 95.1 97.3  PLT 133*  129* 134* 163   Basic Metabolic Panel: Recent Labs  Lab 08/28/23 0356 08/29/23 0525 08/30/23 0507 08/31/23 0931 09/03/23 0927  NA 136 138 139 138 139  K 4.7 3.6  3.3* 4.1 3.5  CL 103 106 107 107 104  CO2 22 21* 23 20* 25  GLUCOSE 110* 122* 107* 114* 144*  BUN 55* 67* 66* 47* 31*  CREATININE 1.72* 1.67* 1.41* 1.27* 1.33*  CALCIUM  8.7* 9.1 8.8* 8.9 8.7*  MG 2.4  --   --   --  1.5*  PHOS 3.7  --   --   --  3.5   Liver Function Tests: No results for input(s): AST, ALT, ALKPHOS, BILITOT, PROT, ALBUMIN in the last 168 hours. No results for input(s): LIPASE, AMYLASE in the last 168 hours. No results for input(s): AMMONIA in the last 168 hours. Cardiac Enzymes: No results for input(s): CKTOTAL, CKMB, CKMBINDEX, TROPONINI in the last 168 hours. BNP (last 3 results) Recent Labs    08/25/23 0345 08/26/23 0014  BNP 1,008.0* 1,990.3*   CBG: Recent Labs  Lab 09/02/23 1205 09/02/23 1614 09/02/23 2248 09/03/23 0739 09/03/23 1121  GLUCAP 148* 100* 105* 83 114*    Time spent: 35 minutes  Signed:  Elvan Sor  [facility] 09/03/2023 1:10 PM

## 2023-09-03 NOTE — Progress Notes (Signed)
 Mobility Specialist - Progress Note   09/03/23 1534  Mobility  Activity Transferred to/from [facility];Ambulated with assistance in room  Level of Assistance Contact guard assist, steadying assist  Assistive Device Front wheel walker;BSC  Distance Ambulated (ft) 12 ft  Activity Response Tolerated well  Mobility visit 1 Mobility  Mobility Specialist Start Time (ACUTE ONLY) 1404  Mobility Specialist Stop Time (ACUTE ONLY) 1428  Mobility Specialist Time Calculation (min) (ACUTE ONLY) 24 min   Pt sitting in the recliner, utilizing [place]. Pt STS to RW MinA, transferred to the [facility] CGA-MinG to void. Pt remains seated on the [facility] while MS completed bath. Pt STS to RW MinG, stood for ~2 mins while MS dons underwear and pants. Pt returns seated on the [facility] while MS dons bra and top. Pt STS to RW and transferred to the recliner MinG. Pt left seated in the recliner with alarm set and needs within reach, RN notified.  America Silvan Mobility Specialist 09/03/23 4:00 PM

## 2023-09-03 NOTE — TOC Transition Note (Signed)
 Transition of Care [facility]) - Discharge Note   Patient Details  Name: Audrey Duran MRN: [id number] Date of Birth: 02-May-1935  Transition of Care [facility]) CM/SW Contact:  Ladene Lady, LCSW Phone Number: 09/03/2023, 2:58 PM   Clinical Narrative:   Pt discharging back to the [facility]. Dorothe with [facility] notified. Dc summary and fl2 sent. Oxygen  delivered to patient through [employer]. RN given number for report. Ann notified. Jenkins will call nurses station when she has arrived.    Final next level of care: Assisted Living Barriers to Discharge: Barriers Resolved   Patient Goals and CMS Choice   CMS Medicare.gov Compare Post Acute Care list provided to:: Patient        Discharge Placement                    Patient and family notified of of transfer: 09/03/23  Discharge Plan and Services Additional resources added to the After Visit Summary for       Post Acute Care Choice: Home Health          DME Arranged: Oxygen  DME Agency: [employer] Date DME Agency Contacted: 09/03/23   Representative spoke with at DME Agency: ann HH Arranged: PT, OT HH Agency: [facility] Date [facility] Agency Contacted: 09/03/23   Representative spoke with at [facility] Agency: sheila  Social Drivers of Health (SDOH) Interventions SDOH Screenings   Food Insecurity: No Food Insecurity (08/26/2023)  Housing: Low Risk  (08/26/2023)  Transportation Needs: No Transportation Needs (08/26/2023)  Utilities: Not At Risk (08/26/2023)  Depression (PHQ2-9): Low Risk  (10/02/2022)  Financial Resource Strain: Low Risk  (12/03/2020)  Social Connections: Moderately Isolated (08/26/2023)  Stress: No Stress Concern Present (12/03/2020)  Tobacco Use: Medium Risk (08/26/2023)     Readmission Risk Interventions     No data to display

## 2023-09-03 NOTE — NC FL2 (Signed)
   MEDICAID FL2 LEVEL OF CARE FORM     IDENTIFICATION  Patient Name: Audrey Duran Birthdate: August 23, 1934 Sex: female Admission Date (Current Location): 08/24/2023  [facility] and [place] Number:  Chiropodist and Address:  [facility], [address] [place], [place] [address]      Provider Number: [id number]  Attending Physician Name and Address:  Von Bellis, MD  Relative Name and Phone Number:       Current Level of Care: Hospital Recommended Level of Care: Skilled Nursing Facility Prior Approval Number:    Date Approved/Denied:   PASRR Number:    Discharge Plan: Domiciliary (Rest home)    Current Diagnoses: Patient Active Problem List   Diagnosis Date Noted   Acute hypoxic respiratory failure (HCC) 08/25/2023   Acute gastroenteritis 08/24/2023   Bradycardia 08/24/2023   (HFpEF) heart failure with preserved ejection fraction (HCC) 08/24/2023   Cough 08/24/2023   Compression fracture of T11 vertebra (HCC) 03/05/2023   Impaired ambulation 03/05/2023   Dementia with agitation (HCC) 10/13/2022   Altered mental status 10/07/2022   Protein-calorie malnutrition (HCC) 10/07/2022   Decreased appetite 10/07/2022   Acute kidney injury superimposed on chronic kidney disease (HCC) 04/27/2022   Essential hypertension 10/09/2020   Aortic atherosclerosis (HCC) 10/08/2020   Grief at loss of child 04/05/2020   Hypotension 03/03/2020   S/P laparoscopic appendectomy 02/27/2020   History of herpes labialis 02/05/2020   Lower extremity edema 02/01/2020   Delusional disorder (HCC) 01/30/2020   Psychosis, 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   Unintentional weight loss 04/16/2019   Episodes of formed visual hallucinations 10/12/2018   Abnormal mammogram of right breast 04/08/2018   Impaired fasting glucose 08/29/2017   Prediabetes 05/21/2016   History of vertigo 11/16/2015   Hospital discharge follow-up 04/19/2015   History of Clostridium  difficile colitis 12/14/2014   Long-term use of high-risk medication 05/08/2014   Cardiomyopathy (HCC) 02/15/2014   CHF (congestive heart failure) (HCC) 02/15/2014   H/O carotid endarterectomy 02/15/2014   S/P TAH-BSO (total abdominal hysterectomy and bilateral salpingo-oophorectomy) 10/26/2013   Vitamin D  deficiency 10/12/2013   CAD (coronary artery disease), native coronary artery 11/18/2012   CKD stage 3a, GFR 45-59 ml/min (HCC) 10/19/2012   Eosinophilia 09/21/2012   Hyperkalemia 09/21/2012   Anxiety state 09/20/2012   Osteopenia of the elderly 06/20/2012   At high risk for falls 06/20/2012   Encounter for preventive health examination 04/25/2012   Atrophic vaginitis 01/17/2012   Paroxysmal atrial fibrillation (HCC)    Recurrent HSV (herpes simplex virus)    COPD (chronic obstructive pulmonary disease) (HCC)    Ischemic cardiomyopathy    Cystocele or rectocele with complete uterine prolapse 08/02/2011   Screening for breast cancer 07/31/2011   Screening for cervical cancer 07/31/2011   Screening for colon cancer 07/31/2011   Coronary artery disease    History of myocardial infarction    Hyperlipidemia LDL goal <70     Orientation RESPIRATION BLADDER Height & Weight     Self  O2 Incontinent Weight: 129 lb 3 oz (58.6 kg) Height:  5' 3 (160 cm)  BEHAVIORAL SYMPTOMS/MOOD NEUROLOGICAL BOWEL NUTRITION STATUS      Incontinent    AMBULATORY STATUS COMMUNICATION OF NEEDS Skin   Limited Assist Verbally Normal                       Personal Care Assistance Level of Assistance  Bathing, Feeding, Dressing Bathing Assistance: Limited assistance   Dressing Assistance:  Limited assistance     Functional Limitations Info  Sight, Hearing, Speech Sight Info: Adequate Hearing Info: Adequate Speech Info: Adequate    SPECIAL CARE FACTORS FREQUENCY  PT (By licensed PT), OT (By licensed OT)     PT Frequency: 3 times a week OT Frequency: 3 times a week             Contractures Contractures Info: Present    Additional Factors Info  Code Status, Allergies Code Status Info: DNR-limited. Allergies Info: Azithromycin, Clarithromycin, Diclofenac, Lisinopril , Meloxicam, Nitrofurantoin Monohyd Macro, Zocor (Simvastatin), Penicillins           Current Medications (09/03/2023):  This is the current hospital active medication list Current Facility-Administered Medications  Medication Dose Route Frequency Provider Last Rate Last Admin   acetaminophen  (TYLENOL ) tablet 650 mg  650 mg Oral Q6H PRN Arnett Saunders, MD       Or   acetaminophen  (TYLENOL ) suppository 650 mg  650 mg Rectal Q6H PRN Arnett Saunders, MD       acyclovir  (ZOVIRAX ) 200 MG capsule 400 mg  400 mg Oral Daily Lenon Marien CROME, MD   400 mg at 09/03/23 0836   aspirin  EC tablet 81 mg  81 mg Oral QHS Lenon Marien CROME, MD   81 mg at 09/02/23 2253   chlorpheniramine-HYDROcodone  (TUSSIONEX) 10-8 MG/5ML suspension 5 mL  5 mL Oral Q12H PRN Von Bellis, MD       citalopram  (CELEXA ) tablet 20 mg  20 mg Oral Daily Lenon Marien CROME, MD   20 mg at 09/03/23 0836   enoxaparin  (LOVENOX ) injection 30 mg  30 mg Subcutaneous Q24H Basaraba, Iulia, MD   30 mg at 09/02/23 2254   furosemide  (LASIX ) tablet 20 mg  20 mg Oral Daily Lenon Marien CROME, MD   20 mg at 09/03/23 9163   guaiFENesin  (MUCINEX ) 12 hr tablet 600 mg  600 mg Oral BID Basaraba, Iulia, MD   600 mg at 09/03/23 0836   insulin  aspart (novoLOG ) injection 0-9 Units  0-9 Units Subcutaneous TID WC Von Bellis, MD   1 Units at 09/02/23 1244   ipratropium-albuterol  (DUONEB) 0.5-2.5 (3) MG/3ML nebulizer solution 3 mL  3 mL Nebulization Q6H PRN Parris Manna, MD   3 mL at 09/01/23 2058   mirtazapine  (REMERON ) tablet 7.5 mg  7.5 mg Oral QHS Lenon Marien L, MD   7.5 mg at 09/02/23 2253   ondansetron  (ZOFRAN ) tablet 4 mg  4 mg Oral Q6H PRN Arnett Saunders, MD       Or   ondansetron  (ZOFRAN ) injection 4 mg  4 mg Intravenous Q6H PRN  Basaraba, Iulia, MD       Oral care mouth rinse  15 mL Mouth Rinse PRN Aleskerov, Fuad, MD       risperiDONE  (RISPERDAL ) tablet 0.25 mg  0.25 mg Oral QHS Lenon Marien L, MD   0.25 mg at 09/02/23 2253   sodium chloride  flush (NS) 0.9 % injection 3 mL  3 mL Intravenous Q12H Arnett Saunders, MD   3 mL at 09/03/23 1118     Discharge Medications: Please see discharge summary for a list of discharge medications.    STOP taking these medications     cyanocobalamin  1000 MCG tablet    HYDROcodone -acetaminophen  5-325 MG tablet Commonly known as: NORCO/VICODIN    lisinopril  5 MG tablet Commonly known as: ZESTRIL     LORazepam  0.5 MG tablet Commonly known as: ATIVAN     telmisartan  20 MG tablet Commonly known as: MICARDIS   TAKE these medications     acetaminophen  325 MG tablet Commonly known as: TYLENOL  Take by mouth every 4 (four) hours as needed.    acyclovir  400 MG tablet Commonly known as: ZOVIRAX  Take 400 mg by mouth daily.    alendronate  70 MG tablet Commonly known as: FOSAMAX  Take 1 tablet by mouth every 7 days on an empty stomach with a full glass of water    alum & mag hydroxide-simeth 200-200-20 MG/5ML suspension Commonly known as: MAALOX/MYLANTA Take 30 mLs by mouth every 6 (six) hours as needed for indigestion or heartburn.    aspirin  81 MG tablet Take 81 mg by mouth at bedtime.    citalopram  20 MG tablet Commonly known as: CELEXA  Take 1 tablet (20 mg total) by mouth daily.    furosemide  20 MG tablet Commonly known as: LASIX  Take 20 mg by mouth daily.    guaifenesin  100 MG/5ML syrup Commonly known as: ROBITUSSIN Take 300 mg by mouth 4 (four) times daily as needed for cough.    lidocaine  5 % ointment Commonly known as: XYLOCAINE  Apply 1 Application topically as needed.    loperamide 2 MG capsule Commonly known as: IMODIUM Take 4 mg by mouth as needed for diarrhea or loose stools.    magnesium  hydroxide 400 MG/5ML suspension Commonly  known as: MILK OF MAGNESIA Take 30 mLs by mouth daily as needed for mild constipation.    magnesium  oxide 400 MG tablet Commonly known as: MAG-OX Take 400 mg by mouth daily.    mirtazapine  7.5 MG tablet Commonly known as: REMERON  Take 1 tablet (7.5 mg total) by mouth at bedtime.    risperiDONE  0.25 MG tablet Commonly known as: RISPERDAL  Take 1 tablet (0.25 mg total) by mouth at bedtime. What changed: when to take this    rosuvastatin  20 MG tablet Commonly known as: Crestor  Take 1 tablet (20 mg total) by mouth daily. What changed: when to take this    timolol  0.5 % ophthalmic solution Commonly known as: TIMOPTIC  Place 1 drop into both eyes 2 (two) times daily.    Vitamin D3 75 MCG (3000 UT) Tabs Take 1 tablet by mouth daily. Take one by mouth daily      Relevant Imaging Results:  Relevant Lab Results:   Additional Information [id number]  Ladene Lady, LCSW

## 2023-09-03 NOTE — Plan of Care (Signed)

## 2023-09-03 NOTE — Progress Notes (Signed)
 Physical Therapy Treatment Patient Details Name: Audrey Duran MRN: [id number] DOB: 07/03/1935 Today's Date: 09/03/2023   History of Present Illness Patient is an 89 year old female with weakness, vomiting, diarrhea, acute hypoxic respiratory failure, pneumonia, possible aspiration and hypotension requiring pressor support. History of T11 fracture, dementia COPD, CAD, CHF, HTN, CKD, diabetes mellitus.    PT Comments  Pt was pleasant and motivated to participate during the session and put forth good effort throughout. Pt found on 1.5LO2/min with SpO2 92% at rest.  O2 qualification as follows: SpO2 on room air at rest = 82% SpO2 on 2LO2/min while ambulating = 86% SpO2 on 3LO2/min while ambulating = 91%  Pt required min extra time and effort as well as use of the bed rail during sup to sit and then cues for hand placement during sit to stand but no physical assist with either.  Pt ambulated with slow cadence and short B step length but was generally steady without LOB.  Pt reported no adverse symptoms during the session with nursing/MD notified of O2 qualification results per above.  Pt will benefit from continued PT services upon discharge to safely address deficits listed in patient problem list for decreased caregiver assistance and eventual return to PLOF.      If plan is discharge home, recommend the following: A little help with walking and/or transfers;A little help with bathing/dressing/bathroom;Supervision due to cognitive status;Help with stairs or ramp for entrance;Direct supervision/assist for medications management;Direct supervision/assist for financial management;Assist for transportation   Can travel by private vehicle        Equipment Recommendations  Rolling walker (2 wheels)    Recommendations for Other Services       Precautions / Restrictions Precautions Precautions: Fall Precaution Comments: watch BP, SpO2 Restrictions Weight Bearing Restrictions Per Provider  Order: No     Mobility  Bed Mobility Overal bed mobility: Needs Assistance Bed Mobility: Sit to Supine     Supine to sit: Supervision     General bed mobility comments: Extra time, effort, and use of bed rail but no physical assist needed    Transfers Overall transfer level: Needs assistance Equipment used: Rolling walker (2 wheels) Transfers: Sit to/from Stand Sit to Stand: Contact guard assist           General transfer comment: min to mod verbal and tactile cues for hand placement with no physical assist needed to come to standing; fair eccentric control without UE support during stand to sit    Ambulation/Gait Ambulation/Gait assistance: Contact guard assist Gait Distance (Feet): 60 Feet x 2 Assistive device: Rolling walker (2 wheels) Gait Pattern/deviations: Step-through pattern, Decreased step length - right, Decreased step length - left Gait velocity: decreased     General Gait Details: Slow cadence with short B step length but steady without overt LOB   Stairs             Wheelchair Mobility     Tilt Bed    Modified Rankin (Stroke Patients Only)       Balance Overall balance assessment: Needs assistance Sitting-balance support: Feet supported Sitting balance-Leahy Scale: Good     Standing balance support: Bilateral upper extremity supported, During functional activity, Reliant on assistive device for balance Standing balance-Leahy Scale: Fair                              Cognition Arousal: Alert Behavior During Therapy: WFL for tasks assessed/performed Overall Cognitive  Status: No family/caregiver present to determine baseline cognitive functioning                         Following Commands: Follows one step commands with increased time                Exercises      General Comments        Pertinent Vitals/Pain Pain Assessment Pain Assessment: No/denies pain    Home Living                           Prior Function            PT Goals (current goals can now be found in the care plan section) Progress towards PT goals: Progressing toward goals    Frequency    Min 1X/week      PT Plan      Co-evaluation              AM-PAC PT 6 Clicks Mobility   Outcome Measure  Help needed turning from your back to your side while in a flat bed without using bedrails?: A Little Help needed moving from lying on your back to sitting on the side of a flat bed without using bedrails?: A Little Help needed moving to and from a bed to a chair (including a wheelchair)?: A Little Help needed standing up from a chair using your arms (e.g., wheelchair or bedside chair)?: A Little Help needed to walk in hospital room?: A Little Help needed climbing 3-5 steps with a railing? : A Little 6 Click Score: 18    End of Session Equipment Utilized During Treatment: Oxygen ;Gait belt Activity Tolerance: Patient tolerated treatment well Patient left: in chair;with call bell/phone within reach;with chair alarm set Nurse Communication: Mobility status;Other (comment) (SpO2 with mobility per above) PT Visit Diagnosis: Muscle weakness (generalized) (M62.81);Unsteadiness on feet (R26.81);Difficulty in walking, not elsewhere classified (R26.2)     Time: 9079-9055 PT Time Calculation (min) (ACUTE ONLY): 24 min  Charges:    $Gait Training: 8-22 mins $Therapeutic Activity: 8-22 mins PT General Charges $$ ACUTE PT VISIT: 1 Visit                    D. Scott Renley Banwart PT, DPT 09/03/23, 10:56 AM

## 2023-09-03 NOTE — Progress Notes (Signed)
 Report called to [facility] all questions answered  to [employer].

## 2023-09-05 DIAGNOSIS — J9601 Acute respiratory failure with hypoxia: Secondary | ICD-10-CM | POA: Diagnosis not present

## 2023-09-05 DIAGNOSIS — I5030 Unspecified diastolic (congestive) heart failure: Secondary | ICD-10-CM | POA: Diagnosis not present

## 2023-09-06 DIAGNOSIS — Z79899 Other long term (current) drug therapy: Secondary | ICD-10-CM | POA: Diagnosis not present

## 2023-09-06 DIAGNOSIS — E038 Other specified hypothyroidism: Secondary | ICD-10-CM | POA: Diagnosis not present

## 2023-09-06 DIAGNOSIS — D519 Vitamin B12 deficiency anemia, unspecified: Secondary | ICD-10-CM | POA: Diagnosis not present

## 2023-09-06 DIAGNOSIS — E782 Mixed hyperlipidemia: Secondary | ICD-10-CM | POA: Diagnosis not present

## 2023-09-06 DIAGNOSIS — R7309 Other abnormal glucose: Secondary | ICD-10-CM | POA: Diagnosis not present

## 2023-09-07 DIAGNOSIS — J9601 Acute respiratory failure with hypoxia: Secondary | ICD-10-CM | POA: Diagnosis not present

## 2023-09-07 DIAGNOSIS — I130 Hypertensive heart and chronic kidney disease with heart failure and stage 1 through stage 4 chronic kidney disease, or unspecified chronic kidney disease: Secondary | ICD-10-CM | POA: Diagnosis not present

## 2023-09-07 DIAGNOSIS — N1831 Chronic kidney disease, stage 3a: Secondary | ICD-10-CM | POA: Diagnosis not present

## 2023-09-07 DIAGNOSIS — F03911 Unspecified dementia, unspecified severity, with agitation: Secondary | ICD-10-CM | POA: Diagnosis not present

## 2023-09-07 DIAGNOSIS — F32A Depression, unspecified: Secondary | ICD-10-CM | POA: Diagnosis not present

## 2023-09-07 DIAGNOSIS — I480 Paroxysmal atrial fibrillation: Secondary | ICD-10-CM | POA: Diagnosis not present

## 2023-09-07 DIAGNOSIS — N179 Acute kidney failure, unspecified: Secondary | ICD-10-CM | POA: Diagnosis not present

## 2023-09-07 DIAGNOSIS — F0393 Unspecified dementia, unspecified severity, with mood disturbance: Secondary | ICD-10-CM | POA: Diagnosis not present

## 2023-09-07 DIAGNOSIS — D631 Anemia in chronic kidney disease: Secondary | ICD-10-CM | POA: Diagnosis not present

## 2023-09-07 DIAGNOSIS — I5032 Chronic diastolic (congestive) heart failure: Secondary | ICD-10-CM | POA: Diagnosis not present

## 2023-09-13 DIAGNOSIS — E785 Hyperlipidemia, unspecified: Secondary | ICD-10-CM | POA: Diagnosis not present

## 2023-09-13 DIAGNOSIS — F411 Generalized anxiety disorder: Secondary | ICD-10-CM | POA: Diagnosis not present

## 2023-09-13 DIAGNOSIS — D508 Other iron deficiency anemias: Secondary | ICD-10-CM | POA: Diagnosis not present

## 2023-09-13 DIAGNOSIS — I10 Essential (primary) hypertension: Secondary | ICD-10-CM | POA: Diagnosis not present

## 2023-09-13 DIAGNOSIS — F0390 Unspecified dementia without behavioral disturbance: Secondary | ICD-10-CM | POA: Diagnosis not present

## 2023-09-13 DIAGNOSIS — J449 Chronic obstructive pulmonary disease, unspecified: Secondary | ICD-10-CM | POA: Diagnosis not present

## 2023-09-14 DIAGNOSIS — E038 Other specified hypothyroidism: Secondary | ICD-10-CM | POA: Diagnosis not present

## 2023-09-14 DIAGNOSIS — I70223 Atherosclerosis of native arteries of extremities with rest pain, bilateral legs: Secondary | ICD-10-CM | POA: Diagnosis not present

## 2023-09-14 DIAGNOSIS — D519 Vitamin B12 deficiency anemia, unspecified: Secondary | ICD-10-CM | POA: Diagnosis not present

## 2023-09-14 DIAGNOSIS — E119 Type 2 diabetes mellitus without complications: Secondary | ICD-10-CM | POA: Diagnosis not present

## 2023-09-14 DIAGNOSIS — J449 Chronic obstructive pulmonary disease, unspecified: Secondary | ICD-10-CM | POA: Diagnosis not present

## 2023-09-14 DIAGNOSIS — I10 Essential (primary) hypertension: Secondary | ICD-10-CM | POA: Diagnosis not present

## 2023-09-14 DIAGNOSIS — E782 Mixed hyperlipidemia: Secondary | ICD-10-CM | POA: Diagnosis not present

## 2023-09-14 DIAGNOSIS — E559 Vitamin D deficiency, unspecified: Secondary | ICD-10-CM | POA: Diagnosis not present

## 2023-09-20 DIAGNOSIS — L89322 Pressure ulcer of left buttock, stage 2: Secondary | ICD-10-CM | POA: Diagnosis not present

## 2023-09-20 DIAGNOSIS — F419 Anxiety disorder, unspecified: Secondary | ICD-10-CM | POA: Diagnosis not present

## 2023-09-20 DIAGNOSIS — R531 Weakness: Secondary | ICD-10-CM | POA: Diagnosis not present

## 2023-09-20 DIAGNOSIS — F4321 Adjustment disorder with depressed mood: Secondary | ICD-10-CM | POA: Diagnosis not present

## 2023-09-20 DIAGNOSIS — F0390 Unspecified dementia without behavioral disturbance: Secondary | ICD-10-CM | POA: Diagnosis not present

## 2023-09-20 DIAGNOSIS — F03B2 Unspecified dementia, moderate, with psychotic disturbance: Secondary | ICD-10-CM | POA: Diagnosis not present

## 2023-09-20 DIAGNOSIS — F03B18 Unspecified dementia, moderate, with other behavioral disturbance: Secondary | ICD-10-CM | POA: Diagnosis not present

## 2023-09-22 DIAGNOSIS — I10 Essential (primary) hypertension: Secondary | ICD-10-CM | POA: Diagnosis not present

## 2023-09-27 DIAGNOSIS — I10 Essential (primary) hypertension: Secondary | ICD-10-CM | POA: Diagnosis not present

## 2023-09-27 DIAGNOSIS — D508 Other iron deficiency anemias: Secondary | ICD-10-CM | POA: Diagnosis not present

## 2023-09-27 DIAGNOSIS — R634 Abnormal weight loss: Secondary | ICD-10-CM | POA: Diagnosis not present

## 2023-09-27 DIAGNOSIS — L89322 Pressure ulcer of left buttock, stage 2: Secondary | ICD-10-CM | POA: Diagnosis not present

## 2023-09-27 DIAGNOSIS — F22 Delusional disorders: Secondary | ICD-10-CM | POA: Diagnosis not present

## 2023-09-27 DIAGNOSIS — F411 Generalized anxiety disorder: Secondary | ICD-10-CM | POA: Diagnosis not present

## 2023-09-27 DIAGNOSIS — I480 Paroxysmal atrial fibrillation: Secondary | ICD-10-CM | POA: Diagnosis not present

## 2023-09-27 DIAGNOSIS — E785 Hyperlipidemia, unspecified: Secondary | ICD-10-CM | POA: Diagnosis not present

## 2023-09-27 DIAGNOSIS — J449 Chronic obstructive pulmonary disease, unspecified: Secondary | ICD-10-CM | POA: Diagnosis not present

## 2023-09-27 DIAGNOSIS — E46 Unspecified protein-calorie malnutrition: Secondary | ICD-10-CM | POA: Diagnosis not present

## 2023-10-03 DIAGNOSIS — J9601 Acute respiratory failure with hypoxia: Secondary | ICD-10-CM | POA: Diagnosis not present

## 2023-10-03 DIAGNOSIS — I5030 Unspecified diastolic (congestive) heart failure: Secondary | ICD-10-CM | POA: Diagnosis not present

## 2023-10-12 DIAGNOSIS — E782 Mixed hyperlipidemia: Secondary | ICD-10-CM | POA: Diagnosis not present

## 2023-10-12 DIAGNOSIS — I70223 Atherosclerosis of native arteries of extremities with rest pain, bilateral legs: Secondary | ICD-10-CM | POA: Diagnosis not present

## 2023-10-12 DIAGNOSIS — E038 Other specified hypothyroidism: Secondary | ICD-10-CM | POA: Diagnosis not present

## 2023-10-12 DIAGNOSIS — E559 Vitamin D deficiency, unspecified: Secondary | ICD-10-CM | POA: Diagnosis not present

## 2023-10-12 DIAGNOSIS — J449 Chronic obstructive pulmonary disease, unspecified: Secondary | ICD-10-CM | POA: Diagnosis not present

## 2023-10-12 DIAGNOSIS — E119 Type 2 diabetes mellitus without complications: Secondary | ICD-10-CM | POA: Diagnosis not present

## 2023-10-12 DIAGNOSIS — D519 Vitamin B12 deficiency anemia, unspecified: Secondary | ICD-10-CM | POA: Diagnosis not present

## 2023-10-12 DIAGNOSIS — I10 Essential (primary) hypertension: Secondary | ICD-10-CM | POA: Diagnosis not present

## 2023-10-13 ENCOUNTER — Telehealth: Payer: Self-pay | Admitting: Internal Medicine

## 2023-10-13 NOTE — Telephone Encounter (Signed)
 Patient needs to be scheduled unless seh has found another  PCP to discuss:  1) kidney function and medications  2) January admission

## 2023-10-15 NOTE — Telephone Encounter (Signed)
 Called pt but did not get an answer Was unable to lvm  will try back another time to relay message below  Patient needs to be scheduled unless seh has found another  PCP to discuss:   1) kidney function and medications   2) January admission

## 2023-10-19 DIAGNOSIS — F03B18 Unspecified dementia, moderate, with other behavioral disturbance: Secondary | ICD-10-CM | POA: Diagnosis not present

## 2023-10-19 DIAGNOSIS — F03B2 Unspecified dementia, moderate, with psychotic disturbance: Secondary | ICD-10-CM | POA: Diagnosis not present

## 2023-10-19 DIAGNOSIS — F419 Anxiety disorder, unspecified: Secondary | ICD-10-CM | POA: Diagnosis not present

## 2023-10-19 NOTE — Telephone Encounter (Signed)
Attempted to call pt. No answer unable to leave a voicemail

## 2023-10-21 NOTE — Telephone Encounter (Signed)
Attempted to call pt. No answer unable to leave a voicemail

## 2023-10-25 DIAGNOSIS — R634 Abnormal weight loss: Secondary | ICD-10-CM | POA: Diagnosis not present

## 2023-10-25 DIAGNOSIS — E785 Hyperlipidemia, unspecified: Secondary | ICD-10-CM | POA: Diagnosis not present

## 2023-10-25 DIAGNOSIS — I10 Essential (primary) hypertension: Secondary | ICD-10-CM | POA: Diagnosis not present

## 2023-10-25 DIAGNOSIS — D508 Other iron deficiency anemias: Secondary | ICD-10-CM | POA: Diagnosis not present

## 2023-10-25 DIAGNOSIS — L89322 Pressure ulcer of left buttock, stage 2: Secondary | ICD-10-CM | POA: Diagnosis not present

## 2023-10-25 DIAGNOSIS — I480 Paroxysmal atrial fibrillation: Secondary | ICD-10-CM | POA: Diagnosis not present

## 2023-10-25 DIAGNOSIS — F411 Generalized anxiety disorder: Secondary | ICD-10-CM | POA: Diagnosis not present

## 2023-10-25 DIAGNOSIS — J449 Chronic obstructive pulmonary disease, unspecified: Secondary | ICD-10-CM | POA: Diagnosis not present

## 2023-10-25 DIAGNOSIS — E46 Unspecified protein-calorie malnutrition: Secondary | ICD-10-CM | POA: Diagnosis not present

## 2023-10-25 DIAGNOSIS — F22 Delusional disorders: Secondary | ICD-10-CM | POA: Diagnosis not present

## 2023-10-25 NOTE — Telephone Encounter (Signed)
 Attempted to call pt. No answer. Unable to leave a voicemail.

## 2023-10-27 DIAGNOSIS — F03B2 Unspecified dementia, moderate, with psychotic disturbance: Secondary | ICD-10-CM | POA: Diagnosis not present

## 2023-10-27 DIAGNOSIS — F419 Anxiety disorder, unspecified: Secondary | ICD-10-CM | POA: Diagnosis not present

## 2023-10-27 DIAGNOSIS — F03B18 Unspecified dementia, moderate, with other behavioral disturbance: Secondary | ICD-10-CM | POA: Diagnosis not present

## 2023-11-03 DIAGNOSIS — I7091 Generalized atherosclerosis: Secondary | ICD-10-CM | POA: Diagnosis not present

## 2023-11-03 DIAGNOSIS — J9601 Acute respiratory failure with hypoxia: Secondary | ICD-10-CM | POA: Diagnosis not present

## 2023-11-03 DIAGNOSIS — B351 Tinea unguium: Secondary | ICD-10-CM | POA: Diagnosis not present

## 2023-11-03 DIAGNOSIS — I5030 Unspecified diastolic (congestive) heart failure: Secondary | ICD-10-CM | POA: Diagnosis not present

## 2023-11-03 NOTE — Telephone Encounter (Signed)
 Spoke with pt's daughter she stated that pt has moved to a memory care facility and has been seeing the provider there. Daughter stated that she would love for pt to still see Dr. Darrick Huntsman once in a while to assess her medications and everything. I advised that I was not sure how that worked that she may want to talk the facility and see what they say about her seeing a provider outside of the facility. Daughter stated that she is going to visit pt tomorrow and will ask that question and get back with [place].

## 2023-11-13 DIAGNOSIS — N939 Abnormal uterine and vaginal bleeding, unspecified: Secondary | ICD-10-CM | POA: Diagnosis not present

## 2023-11-15 DIAGNOSIS — F419 Anxiety disorder, unspecified: Secondary | ICD-10-CM | POA: Diagnosis not present

## 2023-11-15 DIAGNOSIS — F03B18 Unspecified dementia, moderate, with other behavioral disturbance: Secondary | ICD-10-CM | POA: Diagnosis not present

## 2023-11-15 DIAGNOSIS — N939 Abnormal uterine and vaginal bleeding, unspecified: Secondary | ICD-10-CM | POA: Diagnosis not present

## 2023-11-15 DIAGNOSIS — F03B2 Unspecified dementia, moderate, with psychotic disturbance: Secondary | ICD-10-CM | POA: Diagnosis not present

## 2023-11-22 DIAGNOSIS — D508 Other iron deficiency anemias: Secondary | ICD-10-CM | POA: Diagnosis not present

## 2023-11-22 DIAGNOSIS — E46 Unspecified protein-calorie malnutrition: Secondary | ICD-10-CM | POA: Diagnosis not present

## 2023-11-22 DIAGNOSIS — I70223 Atherosclerosis of native arteries of extremities with rest pain, bilateral legs: Secondary | ICD-10-CM | POA: Diagnosis not present

## 2023-11-22 DIAGNOSIS — I10 Essential (primary) hypertension: Secondary | ICD-10-CM | POA: Diagnosis not present

## 2023-11-22 DIAGNOSIS — E785 Hyperlipidemia, unspecified: Secondary | ICD-10-CM | POA: Diagnosis not present

## 2023-11-22 DIAGNOSIS — J449 Chronic obstructive pulmonary disease, unspecified: Secondary | ICD-10-CM | POA: Diagnosis not present

## 2023-11-22 DIAGNOSIS — D519 Vitamin B12 deficiency anemia, unspecified: Secondary | ICD-10-CM | POA: Diagnosis not present

## 2023-11-22 DIAGNOSIS — E559 Vitamin D deficiency, unspecified: Secondary | ICD-10-CM | POA: Diagnosis not present

## 2023-11-22 DIAGNOSIS — I480 Paroxysmal atrial fibrillation: Secondary | ICD-10-CM | POA: Diagnosis not present

## 2023-11-22 DIAGNOSIS — E119 Type 2 diabetes mellitus without complications: Secondary | ICD-10-CM | POA: Diagnosis not present

## 2023-11-22 DIAGNOSIS — E038 Other specified hypothyroidism: Secondary | ICD-10-CM | POA: Diagnosis not present

## 2023-11-22 DIAGNOSIS — E782 Mixed hyperlipidemia: Secondary | ICD-10-CM | POA: Diagnosis not present

## 2023-11-22 DIAGNOSIS — F22 Delusional disorders: Secondary | ICD-10-CM | POA: Diagnosis not present

## 2023-11-22 DIAGNOSIS — F411 Generalized anxiety disorder: Secondary | ICD-10-CM | POA: Diagnosis not present

## 2023-11-29 DIAGNOSIS — R609 Edema, unspecified: Secondary | ICD-10-CM | POA: Diagnosis not present

## 2023-12-03 DIAGNOSIS — I5030 Unspecified diastolic (congestive) heart failure: Secondary | ICD-10-CM | POA: Diagnosis not present

## 2023-12-03 DIAGNOSIS — J9601 Acute respiratory failure with hypoxia: Secondary | ICD-10-CM | POA: Diagnosis not present

## 2023-12-06 ENCOUNTER — Emergency Department

## 2023-12-06 ENCOUNTER — Other Ambulatory Visit: Payer: Self-pay

## 2023-12-06 ENCOUNTER — Encounter: Payer: Self-pay | Admitting: Internal Medicine

## 2023-12-06 ENCOUNTER — Inpatient Hospital Stay
Admission: EM | Admit: 2023-12-06 | Discharge: 2023-12-14 | DRG: 682 | Attending: Internal Medicine | Admitting: Internal Medicine

## 2023-12-06 DIAGNOSIS — Z66 Do not resuscitate: Secondary | ICD-10-CM | POA: Diagnosis present

## 2023-12-06 DIAGNOSIS — I6782 Cerebral ischemia: Secondary | ICD-10-CM | POA: Diagnosis not present

## 2023-12-06 DIAGNOSIS — Z803 Family history of malignant neoplasm of breast: Secondary | ICD-10-CM

## 2023-12-06 DIAGNOSIS — I951 Orthostatic hypotension: Secondary | ICD-10-CM | POA: Diagnosis present

## 2023-12-06 DIAGNOSIS — E785 Hyperlipidemia, unspecified: Secondary | ICD-10-CM | POA: Diagnosis present

## 2023-12-06 DIAGNOSIS — D519 Vitamin B12 deficiency anemia, unspecified: Secondary | ICD-10-CM | POA: Diagnosis not present

## 2023-12-06 DIAGNOSIS — I2510 Atherosclerotic heart disease of native coronary artery without angina pectoris: Secondary | ICD-10-CM | POA: Diagnosis present

## 2023-12-06 DIAGNOSIS — F411 Generalized anxiety disorder: Secondary | ICD-10-CM | POA: Diagnosis present

## 2023-12-06 DIAGNOSIS — M4854XA Collapsed vertebra, not elsewhere classified, thoracic region, initial encounter for fracture: Secondary | ICD-10-CM | POA: Diagnosis present

## 2023-12-06 DIAGNOSIS — Z043 Encounter for examination and observation following other accident: Secondary | ICD-10-CM | POA: Diagnosis not present

## 2023-12-06 DIAGNOSIS — R296 Repeated falls: Secondary | ICD-10-CM | POA: Insufficient documentation

## 2023-12-06 DIAGNOSIS — I252 Old myocardial infarction: Secondary | ICD-10-CM

## 2023-12-06 DIAGNOSIS — N189 Chronic kidney disease, unspecified: Secondary | ICD-10-CM | POA: Diagnosis present

## 2023-12-06 DIAGNOSIS — I255 Ischemic cardiomyopathy: Secondary | ICD-10-CM | POA: Diagnosis present

## 2023-12-06 DIAGNOSIS — I480 Paroxysmal atrial fibrillation: Secondary | ICD-10-CM | POA: Diagnosis present

## 2023-12-06 DIAGNOSIS — Z7982 Long term (current) use of aspirin: Secondary | ICD-10-CM

## 2023-12-06 DIAGNOSIS — L8990 Pressure ulcer of unspecified site, unspecified stage: Secondary | ICD-10-CM | POA: Insufficient documentation

## 2023-12-06 DIAGNOSIS — E875 Hyperkalemia: Secondary | ICD-10-CM | POA: Diagnosis present

## 2023-12-06 DIAGNOSIS — I10 Essential (primary) hypertension: Secondary | ICD-10-CM | POA: Diagnosis not present

## 2023-12-06 DIAGNOSIS — E038 Other specified hypothyroidism: Secondary | ICD-10-CM | POA: Diagnosis not present

## 2023-12-06 DIAGNOSIS — E46 Unspecified protein-calorie malnutrition: Secondary | ICD-10-CM | POA: Diagnosis present

## 2023-12-06 DIAGNOSIS — N1832 Chronic kidney disease, stage 3b: Secondary | ICD-10-CM | POA: Insufficient documentation

## 2023-12-06 DIAGNOSIS — W19XXXA Unspecified fall, initial encounter: Secondary | ICD-10-CM | POA: Diagnosis not present

## 2023-12-06 DIAGNOSIS — M545 Low back pain, unspecified: Secondary | ICD-10-CM | POA: Diagnosis not present

## 2023-12-06 DIAGNOSIS — I959 Hypotension, unspecified: Secondary | ICD-10-CM | POA: Diagnosis not present

## 2023-12-06 DIAGNOSIS — G319 Degenerative disease of nervous system, unspecified: Secondary | ICD-10-CM | POA: Diagnosis not present

## 2023-12-06 DIAGNOSIS — Z1152 Encounter for screening for COVID-19: Secondary | ICD-10-CM

## 2023-12-06 DIAGNOSIS — R7309 Other abnormal glucose: Secondary | ICD-10-CM | POA: Diagnosis not present

## 2023-12-06 DIAGNOSIS — E441 Mild protein-calorie malnutrition: Secondary | ICD-10-CM | POA: Diagnosis present

## 2023-12-06 DIAGNOSIS — S0990XA Unspecified injury of head, initial encounter: Secondary | ICD-10-CM | POA: Diagnosis not present

## 2023-12-06 DIAGNOSIS — S22080A Wedge compression fracture of T11-T12 vertebra, initial encounter for closed fracture: Secondary | ICD-10-CM | POA: Diagnosis present

## 2023-12-06 DIAGNOSIS — E782 Mixed hyperlipidemia: Secondary | ICD-10-CM | POA: Diagnosis not present

## 2023-12-06 DIAGNOSIS — I5030 Unspecified diastolic (congestive) heart failure: Secondary | ICD-10-CM | POA: Diagnosis present

## 2023-12-06 DIAGNOSIS — Z820 Family history of epilepsy and other diseases of the nervous system: Secondary | ICD-10-CM

## 2023-12-06 DIAGNOSIS — S199XXA Unspecified injury of neck, initial encounter: Secondary | ICD-10-CM | POA: Diagnosis not present

## 2023-12-06 DIAGNOSIS — D631 Anemia in chronic kidney disease: Secondary | ICD-10-CM | POA: Diagnosis present

## 2023-12-06 DIAGNOSIS — Z6821 Body mass index (BMI) 21.0-21.9, adult: Secondary | ICD-10-CM

## 2023-12-06 DIAGNOSIS — R42 Dizziness and giddiness: Secondary | ICD-10-CM

## 2023-12-06 DIAGNOSIS — L89153 Pressure ulcer of sacral region, stage 3: Secondary | ICD-10-CM | POA: Diagnosis present

## 2023-12-06 DIAGNOSIS — N179 Acute kidney failure, unspecified: Secondary | ICD-10-CM | POA: Diagnosis not present

## 2023-12-06 DIAGNOSIS — F0394 Unspecified dementia, unspecified severity, with anxiety: Secondary | ICD-10-CM | POA: Diagnosis present

## 2023-12-06 DIAGNOSIS — R0902 Hypoxemia: Secondary | ICD-10-CM | POA: Diagnosis not present

## 2023-12-06 DIAGNOSIS — J449 Chronic obstructive pulmonary disease, unspecified: Secondary | ICD-10-CM | POA: Diagnosis present

## 2023-12-06 DIAGNOSIS — J9601 Acute respiratory failure with hypoxia: Secondary | ICD-10-CM | POA: Diagnosis present

## 2023-12-06 DIAGNOSIS — Z79899 Other long term (current) drug therapy: Secondary | ICD-10-CM

## 2023-12-06 DIAGNOSIS — Z87891 Personal history of nicotine dependence: Secondary | ICD-10-CM

## 2023-12-06 DIAGNOSIS — E872 Acidosis, unspecified: Secondary | ICD-10-CM | POA: Diagnosis not present

## 2023-12-06 DIAGNOSIS — R339 Retention of urine, unspecified: Secondary | ICD-10-CM | POA: Diagnosis not present

## 2023-12-06 DIAGNOSIS — Z7983 Long term (current) use of bisphosphonates: Secondary | ICD-10-CM

## 2023-12-06 DIAGNOSIS — R64 Cachexia: Secondary | ICD-10-CM | POA: Diagnosis present

## 2023-12-06 DIAGNOSIS — Z8249 Family history of ischemic heart disease and other diseases of the circulatory system: Secondary | ICD-10-CM

## 2023-12-06 DIAGNOSIS — I130 Hypertensive heart and chronic kidney disease with heart failure and stage 1 through stage 4 chronic kidney disease, or unspecified chronic kidney disease: Secondary | ICD-10-CM | POA: Diagnosis present

## 2023-12-06 LAB — COMPREHENSIVE METABOLIC PANEL WITH GFR
ALT: 13 U/L (ref 0–44)
AST: 18 U/L (ref 15–41)
Albumin: 2.9 g/dL — ABNORMAL LOW (ref 3.5–5.0)
Alkaline Phosphatase: 47 U/L (ref 38–126)
Anion gap: 4 — ABNORMAL LOW (ref 5–15)
BUN: 70 mg/dL — ABNORMAL HIGH (ref 8–23)
CO2: 25 mmol/L (ref 22–32)
Calcium: 9.3 mg/dL (ref 8.9–10.3)
Chloride: 110 mmol/L (ref 98–111)
Creatinine, Ser: 2.63 mg/dL — ABNORMAL HIGH (ref 0.44–1.00)
GFR, Estimated: 17 mL/min — ABNORMAL LOW (ref >60)
Glucose, Bld: 138 mg/dL — ABNORMAL HIGH (ref 70–99)
Potassium: 5.2 mmol/L — ABNORMAL HIGH (ref 3.5–5.1)
Sodium: 139 mmol/L (ref 135–145)
Total Bilirubin: 0.6 mg/dL (ref 0.0–1.2)
Total Protein: 6.4 g/dL — ABNORMAL LOW (ref 6.5–8.1)

## 2023-12-06 LAB — BRAIN NATRIURETIC PEPTIDE: B Natriuretic Peptide: 150.2 pg/mL — ABNORMAL HIGH (ref 0.0–100.0)

## 2023-12-06 LAB — RESP PANEL BY RT-PCR (RSV, FLU A&B, COVID)  RVPGX2
Influenza A by PCR: NEGATIVE
Influenza B by PCR: NEGATIVE
Resp Syncytial Virus by PCR: NEGATIVE
SARS Coronavirus 2 by RT PCR: NEGATIVE

## 2023-12-06 LAB — CBC WITH DIFFERENTIAL/PLATELET
Abs Immature Granulocytes: 0.14 10*3/uL — ABNORMAL HIGH (ref 0.00–0.07)
Basophils Absolute: 0.1 10*3/uL (ref 0.0–0.1)
Basophils Relative: 1 %
Eosinophils Absolute: 0.3 10*3/uL (ref 0.0–0.5)
Eosinophils Relative: 3 %
HCT: 30.5 % — ABNORMAL LOW (ref 36.0–46.0)
Hemoglobin: 9.4 g/dL — ABNORMAL LOW (ref 12.0–15.0)
Immature Granulocytes: 1 %
Lymphocytes Relative: 12 %
Lymphs Abs: 1.5 10*3/uL (ref 0.7–4.0)
MCH: 29.1 pg (ref 26.0–34.0)
MCHC: 30.8 g/dL (ref 30.0–36.0)
MCV: 94.4 fL (ref 80.0–100.0)
Monocytes Absolute: 1 10*3/uL (ref 0.1–1.0)
Monocytes Relative: 8 %
Neutro Abs: 9.6 10*3/uL — ABNORMAL HIGH (ref 1.7–7.7)
Neutrophils Relative %: 75 %
Platelets: 165 10*3/uL (ref 150–400)
RBC: 3.23 MIL/uL — ABNORMAL LOW (ref 3.87–5.11)
RDW: 13.9 % (ref 11.5–15.5)
WBC: 12.7 10*3/uL — ABNORMAL HIGH (ref 4.0–10.5)
nRBC: 0 % (ref 0.0–0.2)

## 2023-12-06 LAB — PROTIME-INR
INR: 1.2 (ref 0.8–1.2)
Prothrombin Time: 15 s (ref 11.4–15.2)

## 2023-12-06 LAB — TROPONIN I (HIGH SENSITIVITY)
Troponin I (High Sensitivity): 15 ng/L (ref <18)
Troponin I (High Sensitivity): 12 ng/L (ref <18)

## 2023-12-06 MED ORDER — ACETAMINOPHEN 325 MG PO TABS
650.0000 mg | ORAL_TABLET | Freq: Four times a day (QID) | ORAL | Status: AC | PRN
  Filled 2023-12-06: qty 2

## 2023-12-06 MED ORDER — SENNOSIDES-DOCUSATE SODIUM 8.6-50 MG PO TABS
1.0000 | ORAL_TABLET | Freq: Every evening | ORAL | Status: DC | PRN
  Administered 2023-12-09 – 2023-12-13 (×2): 1 via ORAL
  Filled 2023-12-06 (×2): qty 1

## 2023-12-06 MED ORDER — GUAIFENESIN 100 MG/5ML PO LIQD: 300.0000 mg | Freq: Four times a day (QID) | ORAL | Status: DC | PRN

## 2023-12-06 MED ORDER — IPRATROPIUM-ALBUTEROL 0.5-2.5 (3) MG/3ML IN SOLN: 3.0000 mL | Freq: Four times a day (QID) | RESPIRATORY_TRACT | Status: DC | PRN

## 2023-12-06 MED ORDER — LORAZEPAM 0.5 MG PO TABS: 0.5000 mg | ORAL_TABLET | Freq: Four times a day (QID) | ORAL | Status: DC | PRN

## 2023-12-06 MED ORDER — LACTATED RINGERS IV BOLUS: 500.0000 mL | Freq: Once | INTRAVENOUS | Status: DC

## 2023-12-06 MED ORDER — ROSUVASTATIN CALCIUM 20 MG PO TABS
20.0000 mg | ORAL_TABLET | Freq: Every day | ORAL | Status: DC
  Administered 2023-12-06 – 2023-12-13 (×8): 20 mg via ORAL
  Filled 2023-12-06 (×8): qty 1

## 2023-12-06 MED ORDER — HEPARIN SODIUM (PORCINE) 5000 UNIT/ML IJ SOLN
5000.0000 [IU] | Freq: Three times a day (TID) | INTRAMUSCULAR | Status: DC
  Administered 2023-12-06 – 2023-12-14 (×23): 5000 [IU] via SUBCUTANEOUS
  Filled 2023-12-06 (×22): qty 1

## 2023-12-06 MED ORDER — HYDRALAZINE HCL 20 MG/ML IJ SOLN: 5.0000 mg | Freq: Four times a day (QID) | INTRAMUSCULAR | Status: AC | PRN

## 2023-12-06 MED ORDER — ACETAMINOPHEN 650 MG RE SUPP: 650.0000 mg | Freq: Four times a day (QID) | RECTAL | Status: AC | PRN

## 2023-12-06 MED ORDER — IPRATROPIUM-ALBUTEROL 0.5-2.5 (3) MG/3ML IN SOLN
3.0000 mL | Freq: Four times a day (QID) | RESPIRATORY_TRACT | Status: AC
Start: 2023-12-06 — End: 2023-12-07
  Administered 2023-12-06 – 2023-12-07 (×2): 3 mL via RESPIRATORY_TRACT
  Filled 2023-12-06 (×2): qty 3

## 2023-12-06 MED ORDER — ONDANSETRON HCL 4 MG/2ML IJ SOLN: 4.0000 mg | Freq: Four times a day (QID) | INTRAMUSCULAR | Status: AC | PRN

## 2023-12-06 MED ORDER — SODIUM ZIRCONIUM CYCLOSILICATE 10 G PO PACK
10.0000 g | PACK | Freq: Once | ORAL | Status: AC
  Administered 2023-12-07: 10 g via ORAL
  Filled 2023-12-06 (×2): qty 1

## 2023-12-06 MED ORDER — ONDANSETRON HCL 4 MG PO TABS: 4.0000 mg | ORAL_TABLET | Freq: Four times a day (QID) | ORAL | Status: AC | PRN

## 2023-12-06 MED ORDER — MELATONIN 5 MG PO TABS
5.0000 mg | ORAL_TABLET | Freq: Every evening | ORAL | Status: DC | PRN
  Administered 2023-12-06 – 2023-12-12 (×2): 5 mg via ORAL
  Filled 2023-12-06 (×2): qty 1

## 2023-12-06 MED ORDER — SODIUM CHLORIDE 0.9 % IV SOLN: INTRAVENOUS | Status: AC

## 2023-12-06 NOTE — Assessment & Plan Note (Addendum)
 With possible exacerbation DuoNebs 4 times daily, 3 doses ordered on admission  Continue oxygen  supplementation to maintain SpO2 greater than 92%

## 2023-12-06 NOTE — Assessment & Plan Note (Signed)
 Scheduled antihypertensive medication will not be ordered on admission

## 2023-12-06 NOTE — Assessment & Plan Note (Addendum)
 On baseline CKD 3b Strict I's and O's Status post NS 200 mL liter bolus per EMS  Discontinue LR 500 mL liter bolus per EDP On admission I ordered sodium chloride  infusion at 100 mL/h, 10 hours ordered to complete a 1 L bag Recheck renal function on BMP tomorrow

## 2023-12-06 NOTE — H&P (Signed)
 History and Physical   Audrey Duran ZOX:[id number] DOB: 26-Apr-1935 DOA: 12/06/2023  PCP: Thersia Flax, MD  Outpatient Specialists: Dr. Eston Hence [facility] cardiology Patient coming from: [facility] via EMS  I have personally briefly reviewed patient's old medical records in [facility] EMR.  Chief Concern: Fall  HPI: Audrey Duran is an 89 year old female with history of paroxysmal atrial fibrillation on aspirin , history of hypertension, dementia, chronic kidney stage IIIb, hyperlipidemia, with history of hypertension, who presents to emergency department for chief concerns of fall at [facility] via EMS.  Vitals in the ED showed temperature of 97.8, respiration rate 30, heart rate of 61, blood pressure 121/56, SpO2 of 100% on 2 L nasal cannula.  Per ED provider, patient desatted to the 87% on room air and previously no oxygen  supplementation requirement.  Serum sodium is 139, potassium 5.2, chloride 110, bicarb 25, BUN of 70, serum creatinine of 2.63, EGFR 17, nonfasting blood glucose 139, WBC 12.7, hemoglobin 9.4, platelets of 165.  BNP is 150.2.  HS troponin is 15.  UA was ordered and pending collection at this time.  ED treatment: LR 500 mL liter bolus, Lokelma 10 g p.o. one-time dose. ---------------------------------- At bedside, patient was able to tell me her first and last name, her age, her current location.  She was not able to tell me the current calendar year.  She reports that she has been having generalized weakness with decreased appetite.  She has been having dysuria for about 1 week.  She denies blood in her urine.  She denies nausea, vomiting, chest pain, shortness of breath.  She reports that normally she does not wear oxygen  supplementation.  She reports subjective fever and chills over the last week however has not told anybody because she is afraid that they would think she is sick and sent her to the hospital.  Social history: She  lives in assisted living.  Patient is divorced.  She denies tobacco, EtOH, recreational drug use.  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  ROS: Constitutional: no weight change, + fever ENT/Mouth: no sore throat, no rhinorrhea Eyes: no eye pain, no vision changes Cardiovascular: no chest pain, + dyspnea,  no edema, no palpitations Respiratory: no cough, no sputum, no wheezing Gastrointestinal: no nausea, no vomiting, no diarrhea, no constipation Genitourinary: no urinary incontinence, no dysuria, no hematuria Musculoskeletal: no arthralgias, no myalgias Skin: no skin lesions, no pruritus, Neuro: + weakness, no loss of consciousness, no syncope Psych: no anxiety, no depression, + decrease appetite Heme/Lymph: no bruising, no bleeding  ED Course: Discussed with EDP, patient requiring hospitalization for chief concerns of acute kidney injury, and acute hypoxic respiratory failure.  Assessment/Plan  Principal Problem:   AKI (acute kidney injury) (HCC) Active Problems:   COPD (chronic obstructive pulmonary disease) (HCC)   Hypotension   Acute kidney injury superimposed on chronic kidney disease (HCC)   Acute hypoxic respiratory failure (HCC)   Anxiety state   Paroxysmal atrial fibrillation (HCC)   Hyperkalemia   CAD (coronary artery disease), native coronary artery   Essential hypertension   Protein-calorie malnutrition (HCC)   Compression fracture of T11 vertebra (HCC)   (HFpEF) heart failure with preserved ejection fraction (HCC)   CKD stage 3b, GFR 30-44 ml/min (HCC)   Falling   Assessment and Plan:  * AKI (acute kidney injury) (HCC) On baseline CKD 3b Strict I's and O's Status post NS 200 mL liter bolus per EMS  Discontinue LR  500 mL liter bolus per EDP On admission I ordered sodium chloride  infusion at 100 mL/h, 10 hours ordered to complete a 1 L bag Recheck renal function on BMP tomorrow  Acute hypoxic respiratory failure (HCC) Etiology workup in  progress Low clinical suspicion for heart failure as BNP is low compared to prior values Continue oxygen  as needed to maintain SpO2 greater than 92% Continuous pulse oximetry ordered on admission  Hypotension Scheduled antihypertensive medication will not be ordered on admission  COPD (chronic obstructive pulmonary disease) (HCC) With possible exacerbation DuoNebs 4 times daily, 3 doses ordered on admission  Continue oxygen  supplementation to maintain SpO2 greater than 92%  Anxiety state PDMP reviewed Patient last had lorazepam 0.5 mg tablet, 30 tablet for 15 days written and filled on 05/28/2023.  Tramadol 50 mg tablet, 45 tablet, for 22 days written and filled on 03/12/2023.  Falling Fall precautions  Essential hypertension Hydralazine 5 mg IV every 6 hours as needed for SBP greater 170, 5 days ordered  Hyperkalemia Status post Lokelma 10 g per EDP  Chart reviewed.   DVT prophylaxis: Heparin 5000 units subcutaneous every 8 hours Code Status: DNR/DNI, confirmed at bedside with Martell Skinner at bedside.  Of note, MOST form reviewed from 09/07/2023 with no effective date Diet: Renal Family Communication: Updated sister, Louanna Rouse at bedside with patient's permission Disposition Plan: Pending clinical course Consults called: None at this time Admission status: Telemetry medical, observation  Past Medical History:  Diagnosis Date   Anemia    Atrial fibrillation (HCC)    COPD (chronic obstructive pulmonary disease) (HCC)    Coronary artery disease 1995   s/p AMI , no history of stents,  [name]   Hyperlipidemia    Ischemic cardiomyopathy Dec 2011   ETT Sestamibi study apical scar, no ischemia, [name]   Myocardial infarction (HCC)    Recurrent HSV (herpes simplex virus)    Past Surgical History:  Procedure Laterality Date   ABDOMINAL HYSTERECTOMY  june 2014   BREAST EXCISIONAL BIOPSY Left 1990's   neg   CAROTID ENDARTERECTOMY  July 2007   Dew, right carotid    CATARACT EXTRACTION  2004, 2006   JOINT REPLACEMENT  July 2013   total hip , Miller   LAPAROSCOPIC APPENDECTOMY N/A 02/20/2020   Procedure: APPENDECTOMY LAPAROSCOPIC;  Surgeon: Alben Alma, MD;  Location: [facility] ORS;  Service: General;  Laterality: N/A;   Social History:  reports that she quit smoking about 31 years ago. Her smoking use included cigarettes. She has never used smokeless tobacco. She reports that she does not drink alcohol and does not use drugs.  Allergies  Allergen Reactions   Azithromycin     GI upset   Clarithromycin     ? Rash   Diclofenac     ? Rash   Lisinopril  Other (See Comments)    hyperkalemia   Meloxicam     ? Rash   Nitrofurantoin Monohyd Macro     ? rash   Zocor [Simvastatin]    Penicillins Rash   Family History  Problem Relation Age of Onset   Cancer Mother 46       Pancreatic    Heart disease Father    Cancer Sister 78       Breast Cancer   Breast cancer Sister 56   Multiple sclerosis Daughter    Family history: Family history reviewed and not pertinent.  Prior to Admission medications   Medication Sig Start Date End Date Taking? Authorizing Provider  acetaminophen  (  TYLENOL ) 325 MG tablet Take by mouth every 4 (four) hours as needed.    [provider]  acyclovir  (ZOVIRAX ) 400 MG tablet Take 400 mg by mouth daily. 02/09/23   [provider]  alendronate  (FOSAMAX ) 70 MG tablet Take 1 tablet by mouth every 7 days on an empty stomach with a full glass of water 04/27/22   Thersia Flax, MD  alum & mag hydroxide-simeth (MAALOX/MYLANTA) 200-200-20 MG/5ML suspension Take 30 mLs by mouth every 6 (six) hours as needed for indigestion or heartburn.    [provider]  aspirin  81 MG tablet Take 81 mg by mouth at bedtime.    [provider]  Cholecalciferol  (VITAMIN D3) 75 MCG (3000 UT) TABS Take 1 tablet by mouth daily. Take one by mouth daily 04/27/22   Thersia Flax, MD  citalopram  (CELEXA ) 20 MG tablet Take 1  tablet (20 mg total) by mouth daily. 01/11/23   Thersia Flax, MD  furosemide  (LASIX ) 20 MG tablet Take 20 mg by mouth daily. 07/19/23   [provider]  guaifenesin  (ROBITUSSIN) 100 MG/5ML syrup Take 300 mg by mouth 4 (four) times daily as needed for cough.    [provider]  lidocaine  (XYLOCAINE ) 5 % ointment Apply 1 Application topically as needed. 03/06/23   Donaciano Frizzle, MD  loperamide (IMODIUM) 2 MG capsule Take 4 mg by mouth as needed for diarrhea or loose stools.    [provider]  magnesium  hydroxide (MILK OF MAGNESIA) 400 MG/5ML suspension Take 30 mLs by mouth daily as needed for mild constipation.    [provider]  magnesium  oxide (MAG-OX) 400 MG tablet Take 400 mg by mouth daily.    [provider]  mirtazapine  (REMERON ) 7.5 MG tablet Take 1 tablet (7.5 mg total) by mouth at bedtime. 10/12/22 08/24/23  Twilla Galea, MD  risperiDONE  (RISPERDAL ) 0.25 MG tablet Take 1 tablet (0.25 mg total) by mouth at bedtime. Patient taking differently: Take 0.25 mg by mouth 2 (two) times daily. 10/12/22 08/25/23  Twilla Galea, MD  rosuvastatin  (CRESTOR ) 20 MG tablet Take 1 tablet (20 mg total) by mouth daily. Patient taking differently: Take 20 mg by mouth at bedtime. 04/27/22   Thersia Flax, MD  timolol  (TIMOPTIC ) 0.5 % ophthalmic solution Place 1 drop into both eyes 2 (two) times daily. 02/24/23   [provider]   Physical Exam: Vitals:   12/06/23 1130 12/06/23 1200 12/06/23 1245 12/06/23 1334  BP: (!) 111/40 (!) 96/34 (!) 116/58 (!) 109/41  Pulse: (!) 57 (!) 59 63 62  Resp: (!) 30 (!) 25 (!) 36 14  Temp:    97.7 F (36.5 C)  TempSrc:    Oral  SpO2: 100% 100% 99% 98%  Height:       Constitutional: appears frail, cachectic appearing Eyes: PERRL, lids and conjunctivae normal ENMT: Mucous membranes are moist. Posterior pharynx clear of any exudate or lesions. Age-appropriate dentition. Hearing appropriate Neck: normal, supple, no  masses, no thyromegaly Respiratory: clear to auscultation bilaterally, no wheezing, no crackles. Normal respiratory effort. No accessory muscle use.  Cardiovascular: Regular rate and rhythm, no murmurs / rubs / gallops. No extremity edema. 2+ pedal pulses. No carotid bruits.  Abdomen: no tenderness, no masses palpated, no hepatosplenomegaly. Bowel sounds positive.  Musculoskeletal: no clubbing / cyanosis. No joint deformity upper and lower extremities. Good ROM, no contractures, no atrophy. Normal muscle tone.  Skin: no rashes, lesions, ulcers. No induration Neurologic: Sensation intact. Strength 5/5 in all  4.  Psychiatric: Lacks judgment and insight. Alert and oriented x self, age, location only.  Patient was not able to tell me the current calendar year..  Depressed mood.  Flat affect.  EKG: independently reviewed, showing sinus rhythm with rate of 71, QTc 461  Chest x-ray on Admission: I personally reviewed and I agree with radiologist reading as below.  DG Chest 1 View Result Date: 12/06/2023 CLINICAL DATA:  Hypoxia EXAM: CHEST  1 VIEW COMPARISON:  September 01, 2023 FINDINGS: The heart size and mediastinal contours are within normal limits. Both lungs are clear. The visualized skeletal structures are unremarkable. IMPRESSION: No active disease. Electronically Signed   By: Fredrich Jefferson M.D.   On: 12/06/2023 11:39   DG Hip Unilat With Pelvis 2-3 Views Right Result Date: 12/06/2023 CLINICAL DATA:  Status post fall EXAM: DG HIP (WITH OR WITHOUT PELVIS) 2-3V RIGHT COMPARISON:  None Available. FINDINGS: There is no evidence of hip fracture or dislocation. There is no evidence of arthropathy or other focal bone abnormality. Prior ORIF right hip IMPRESSION: Negative. Electronically Signed   By: Fredrich Jefferson M.D.   On: 12/06/2023 11:39   CT Head Wo Contrast Result Date: 12/06/2023 CLINICAL DATA:  Head trauma, minor EXAM: CT HEAD WITHOUT CONTRAST CT CERVICAL SPINE WITHOUT CONTRAST TECHNIQUE:  Multidetector CT imaging of the head and cervical spine was performed following the standard protocol without intravenous contrast. Multiplanar CT image reconstructions of the cervical spine were also generated. RADIATION DOSE REDUCTION: This exam was performed according to the departmental dose-optimization program which includes automated exposure control, adjustment of the mA and/or kV according to patient size and/or use of iterative reconstruction technique. COMPARISON:  03/05/2023 FINDINGS: CT HEAD FINDINGS Brain: No evidence of acute infarction, hemorrhage, hydrocephalus, extra-axial collection or mass lesion/mass effect. Generalized atrophy. Chronic small vessel ischemia in the deep cerebral white matter. Vascular: No hyperdense vessel or unexpected calcification. Skull: Normal. Negative for fracture or focal lesion. Sinuses/Orbits: No acute finding. CT CERVICAL SPINE FINDINGS Alignment: No traumatic malalignment Skull base and vertebrae: No acute fracture. No primary bone lesion or focal pathologic process. Soft tissues and spinal canal: No prevertebral fluid or swelling. No visible canal hematoma. Disc levels:  Ordinary degenerative change for age. Upper chest: No evidence of injury IMPRESSION: No evidence of intracranial or cervical spine injury. Electronically Signed   By: Ronnette Coke M.D.   On: 12/06/2023 11:03   CT Cervical Spine Wo Contrast Result Date: 12/06/2023 CLINICAL DATA:  Head trauma, minor EXAM: CT HEAD WITHOUT CONTRAST CT CERVICAL SPINE WITHOUT CONTRAST TECHNIQUE: Multidetector CT imaging of the head and cervical spine was performed following the standard protocol without intravenous contrast. Multiplanar CT image reconstructions of the cervical spine were also generated. RADIATION DOSE REDUCTION: This exam was performed according to the departmental dose-optimization program which includes automated exposure control, adjustment of the mA and/or kV according to patient size and/or use  of iterative reconstruction technique. COMPARISON:  03/05/2023 FINDINGS: CT HEAD FINDINGS Brain: No evidence of acute infarction, hemorrhage, hydrocephalus, extra-axial collection or mass lesion/mass effect. Generalized atrophy. Chronic small vessel ischemia in the deep cerebral white matter. Vascular: No hyperdense vessel or unexpected calcification. Skull: Normal. Negative for fracture or focal lesion. Sinuses/Orbits: No acute finding. CT CERVICAL SPINE FINDINGS Alignment: No traumatic malalignment Skull base and vertebrae: No acute fracture. No primary bone lesion or focal pathologic process. Soft tissues and spinal canal: No prevertebral fluid or swelling. No visible canal hematoma. Disc levels:  Ordinary degenerative change for age.  Upper chest: No evidence of injury IMPRESSION: No evidence of intracranial or cervical spine injury. Electronically Signed   By: Ronnette Coke M.D.   On: 12/06/2023 11:03   Labs on Admission: I have personally reviewed following labs  CBC: Recent Labs  Lab 12/06/23 1027  WBC 12.7*  NEUTROABS 9.6*  HGB 9.4*  HCT 30.5*  MCV 94.4  PLT 165   Basic Metabolic Panel: Recent Labs  Lab 12/06/23 1027  NA 139  K 5.2*  CL 110  CO2 25  GLUCOSE 138*  BUN 70*  CREATININE 2.63*  CALCIUM  9.3   GFR: CrCl cannot be calculated (Unknown ideal weight.).  Liver Function Tests: Recent Labs  Lab 12/06/23 1027  AST 18  ALT 13  ALKPHOS 47  BILITOT 0.6  PROT 6.4*  ALBUMIN 2.9*   Coagulation Profile: Recent Labs  Lab 12/06/23 1027  INR 1.2   Urine analysis:    Component Value Date/Time   COLORURINE YELLOW (A) 03/05/2023 1111   APPEARANCEUR CLEAR (A) 03/05/2023 1111   APPEARANCEUR Cloudy 02/17/2012 0153   LABSPEC 1.018 03/05/2023 1111   LABSPEC 1.011 02/17/2012 0153   PHURINE 5.0 03/05/2023 1111   GLUCOSEU NEGATIVE 03/05/2023 1111   GLUCOSEU NEGATIVE 12/03/2015 0938   HGBUR NEGATIVE 03/05/2023 1111   BILIRUBINUR NEGATIVE 03/05/2023 1111   BILIRUBINUR  negative 10/11/2018 1425   BILIRUBINUR Negative 02/17/2012 0153   KETONESUR NEGATIVE 03/05/2023 1111   PROTEINUR NEGATIVE 03/05/2023 1111   UROBILINOGEN 0.2 10/11/2018 1425   UROBILINOGEN 0.2 12/03/2015 0938   NITRITE NEGATIVE 03/05/2023 1111   LEUKOCYTESUR NEGATIVE 03/05/2023 1111   LEUKOCYTESUR 3+ 02/17/2012 0153   This document was prepared using Dragon Voice Recognition software and may include unintentional dictation errors.  Dr. Reinhold Carbine [facility]  If 7PM-7AM, please contact overnight-coverage provider If 7AM-7PM, please contact day attending provider [email or web address]  12/06/2023, 4:33 PM

## 2023-12-06 NOTE — Assessment & Plan Note (Signed)
Hydralazine 5 mg IV every 6 hours as needed for SBP greater 170, 5 days ordered

## 2023-12-06 NOTE — Hospital Course (Addendum)
 Mrs. Ligaya Cormier is an 89 year old female with history of paroxysmal atrial fibrillation on aspirin , history of hypertension, dementia, chronic kidney stage IIIb, hyperlipidemia, with history of hypertension, who presents to emergency department for chief concerns of fall at [facility] via EMS. She was found to have acute renal failure, bladder scan showed residual of 962, Foley catheter anchored.  Patient was also found to have profound orthostatic hypotension.  Midodrine  was started. Patient will need nursing home placement. Since then, patient condition had improved, did well with physical therapy.  Family no longer wants nursing home, will discharge to assisted living facility.

## 2023-12-06 NOTE — Assessment & Plan Note (Signed)
 Etiology workup in progress Low clinical suspicion for heart failure as BNP is low compared to prior values Continue oxygen  as needed to maintain SpO2 greater than 92% Continuous pulse oximetry ordered on admission

## 2023-12-06 NOTE — ED Notes (Signed)
 Called to place pt on CCMD monitoring.

## 2023-12-06 NOTE — Assessment & Plan Note (Signed)
 PDMP reviewed Patient last had lorazepam 0.5 mg tablet, 30 tablet for 15 days written and filled on 05/28/2023.  Tramadol 50 mg tablet, 45 tablet, for 22 days written and filled on 03/12/2023.

## 2023-12-06 NOTE — ED Provider Notes (Signed)
 [facility] Provider Note    Event Date/Time   First MD Initiated Contact with Patient 12/06/23 1019     (approximate)   History   Fall   HPI  Audrey Duran is a 89 y.o. female with history of dementia, CHF, COPD, history of paroxysmal A-fib on aspirin , presenting with a fall.  Patient was eating breakfast at her facility, slipped out of her chair, fell backwards and hit her head.  No LOC.  She was complaining about back pain, neck pain.  She denies any new weakness or numbness, denies nausea, vomiting, diarrhea, chest pain, shortness of breath, cough, fever, burning with urination.  Per EMS she was found to be satting 80% on room air, was initially hypotensive to the 80s, she was started on some IV fluids, repeat systolic blood pressures were 161W in the ambulance.  They also noted that she had been having intermittent lightheadedness for last several days.  Independent history obtained from EMS as above.  On independent chart review, she was admitted in February of this year for hypoxia requiring supplemental oxygen , possible aspiration pneumonia as well as decompensated CHF.  Had an echo done in February that showed an EF of 50 to 55%.  Also presented as having nausea vomiting and diarrhea thought secondary to acute gastroenteritis.     Physical Exam   Triage Vital Signs: ED Triage Vitals  Encounter Vitals Group     BP      Systolic BP Percentile      Diastolic BP Percentile      Pulse      Resp      Temp      Temp src      SpO2      Weight      Height      Head Circumference      Peak Flow      Pain Score      Pain Loc      Pain Education      Exclude from Growth Chart     Most recent vital signs: Vitals:   12/06/23 1130 12/06/23 1200  BP: (!) 111/40 (!) 96/34  Pulse: (!) 57 (!) 59  Resp: (!) 30 (!) 25  Temp:    SpO2: 100% 100%     General: Awake, no distress.  CV:  Good peripheral perfusion.  Resp:  Normal effort.  No  thoracic cage tenderness Abd:  No distention.  Soft nontender Other:  No midline spinal tenderness, she does have a palpable swelling to her occiput, tenderness to the right upper parathoracic region without any overlying ecchymoses, tenderness to posterior right hip, full range of motion of all extremities are intact, she has no focal weakness or numbness.  Intact radial and DP pulses.   ED Results / Procedures / Treatments   Labs (all labs ordered are listed, but only abnormal results are displayed) Labs Reviewed  COMPREHENSIVE METABOLIC PANEL WITH GFR - Abnormal; Notable for the following components:      Result Value   Potassium 5.2 (*)    Glucose, Bld 138 (*)    BUN 70 (*)    Creatinine, Ser 2.63 (*)    Total Protein 6.4 (*)    Albumin 2.9 (*)    GFR, Estimated 17 (*)    Anion gap 4 (*)    All other components within normal limits  CBC WITH DIFFERENTIAL/PLATELET - Abnormal; Notable for the following components:   WBC 12.7 (*)  RBC 3.23 (*)    Hemoglobin 9.4 (*)    HCT 30.5 (*)    Neutro Abs 9.6 (*)    Abs Immature Granulocytes 0.14 (*)    All other components within normal limits  BRAIN NATRIURETIC PEPTIDE - Abnormal; Notable for the following components:   B Natriuretic Peptide 150.2 (*)    All other components within normal limits  RESP PANEL BY RT-PCR (RSV, FLU A&B, COVID)  [id number]  PROTIME-INR  URINALYSIS, W/ REFLEX TO CULTURE (INFECTION SUSPECTED)  TROPONIN I (HIGH SENSITIVITY)  TROPONIN I (HIGH SENSITIVITY)     EKG  EKG shows, sinus rhythm, rate 71, normal QRS, normal QTc, baseline wandering but no ischemic ST elevation, T wave flattening in 1, V2, not significantly changed compared to prior   RADIOLOGY On my independent interpretation, CT head without obvious intracranial hemorrhage   PROCEDURES:  Critical Care performed: Yes, see critical care procedure note(s)  .Critical Care  Performed by: Shane Darling, MD Authorized by: Shane Darling, MD    Critical care provider statement:    Critical care time (minutes):  40   Critical care was necessary to treat or prevent imminent or life-threatening deterioration of the following conditions:  Respiratory failure   Critical care was time spent personally by me on the following activities:  Development of treatment plan with patient or surrogate, discussions with consultants, evaluation of patient's response to treatment, examination of patient, ordering and review of laboratory studies, ordering and review of radiographic studies, ordering and performing treatments and interventions, pulse oximetry, re-evaluation of patient's condition and review of old charts    MEDICATIONS ORDERED IN ED: Medications  sodium zirconium cyclosilicate (LOKELMA) packet 10 g (has no administration in time range)  lactated ringers  bolus 500 mL (has no administration in time range)     IMPRESSION / MDM / ASSESSMENT AND PLAN / ED COURSE  I reviewed the triage vital signs and the nursing notes.                              Differential diagnosis includes, but is not limited to, for fall, considered intracranial hemorrhage, fracture, contusion, hematoma.  Will get CT head and cervical spine, x-rays of the hip as well as chest.  For the hypoxia, considered CHF but she does not appear overtly volume overloaded at this time, considered COPD but no wheezing on exam, did consider pneumonia or aspiration pneumonia.  Get labs, EKG, troponin, UA.  Patient's presentation is most consistent with acute presentation with potential threat to life or bodily function.  Independent interpretation of labs and imaging below.  Traumatic workup is negative for acute traumatic injury, UA and respiratory viral panel still pending.  Given the AKI, hypoxia, she will need to be admitted for further management.  Will give her Lokelma here and another 500 cc of IV fluids.  Consult hospitalist was agreeable with plan for admission will  evaluate the patient.  She is admitted.  The patient is on the cardiac monitor to evaluate for evidence of arrhythmia and/or significant heart rate changes.   Clinical Course as of 12/06/23 1227  Mon Dec 06, 2023  1111 CT Head Wo Contrast No evidence of intracranial or cervical spine injury.  [TT]  1111 CT Cervical Spine Wo Contrast No evidence of intracranial or cervical spine injury.  [TT]  1203 DG Hip Unilat With Pelvis 2-3 Views Right Negative [TT]  1205 DG Chest 1 View  No active disease.  [TT]  1207 Independent review of labs, mild leukocytosis, BNP is mildly elevated, troponin is negative, mild hyperkalemia, she does have an AKI, LFTs are normal. [TT]    Clinical Course User Index [TT] Drenda Gentle, Richard Champion, MD     FINAL CLINICAL IMPRESSION(S) / ED DIAGNOSES   Final diagnoses:  Fall, initial encounter  Hypoxia  Lightheaded  AKI (acute kidney injury) (HCC)  Hyperkalemia     Rx / DC Orders   ED Discharge Orders     None        Note:  This document was prepared using Dragon voice recognition software and may include unintentional dictation errors.    Shane Darling, MD 12/06/23 [phone]

## 2023-12-06 NOTE — ED Triage Notes (Addendum)
 Pt arrives from the [place] [facility] [place] via [facility] with c/o of an unwitnessed fall. Pt denies LOC and thinners. Per EMS fell backwards and has a knot to the back on the pt's head on the left side. Per the facility the pt has had dizzy spells for the last several days. Pt was hypotensive upon EMS arrival and received a 200mL bolus of NS enroute to ED. Pt pressure is WNL during triage. Pt is able to speak in full sentences and is c/o of pain to the right hip and the back of their head.

## 2023-12-06 NOTE — Assessment & Plan Note (Signed)
 Fall precautions.

## 2023-12-06 NOTE — Assessment & Plan Note (Signed)
 Status post Lokelma 10 g per EDP

## 2023-12-07 DIAGNOSIS — R64 Cachexia: Secondary | ICD-10-CM | POA: Diagnosis not present

## 2023-12-07 DIAGNOSIS — N1832 Chronic kidney disease, stage 3b: Secondary | ICD-10-CM | POA: Diagnosis not present

## 2023-12-07 DIAGNOSIS — E119 Type 2 diabetes mellitus without complications: Secondary | ICD-10-CM | POA: Diagnosis not present

## 2023-12-07 DIAGNOSIS — I959 Hypotension, unspecified: Secondary | ICD-10-CM | POA: Diagnosis not present

## 2023-12-07 DIAGNOSIS — R531 Weakness: Secondary | ICD-10-CM | POA: Diagnosis not present

## 2023-12-07 DIAGNOSIS — I255 Ischemic cardiomyopathy: Secondary | ICD-10-CM | POA: Diagnosis not present

## 2023-12-07 DIAGNOSIS — I70223 Atherosclerosis of native arteries of extremities with rest pain, bilateral legs: Secondary | ICD-10-CM | POA: Diagnosis not present

## 2023-12-07 DIAGNOSIS — R296 Repeated falls: Secondary | ICD-10-CM | POA: Diagnosis not present

## 2023-12-07 DIAGNOSIS — L89303 Pressure ulcer of unspecified buttock, stage 3: Secondary | ICD-10-CM | POA: Diagnosis not present

## 2023-12-07 DIAGNOSIS — I2510 Atherosclerotic heart disease of native coronary artery without angina pectoris: Secondary | ICD-10-CM | POA: Diagnosis not present

## 2023-12-07 DIAGNOSIS — E441 Mild protein-calorie malnutrition: Secondary | ICD-10-CM | POA: Diagnosis not present

## 2023-12-07 DIAGNOSIS — E785 Hyperlipidemia, unspecified: Secondary | ICD-10-CM | POA: Diagnosis not present

## 2023-12-07 DIAGNOSIS — I951 Orthostatic hypotension: Secondary | ICD-10-CM | POA: Diagnosis not present

## 2023-12-07 DIAGNOSIS — N179 Acute kidney failure, unspecified: Secondary | ICD-10-CM | POA: Diagnosis not present

## 2023-12-07 DIAGNOSIS — R339 Retention of urine, unspecified: Secondary | ICD-10-CM | POA: Diagnosis not present

## 2023-12-07 DIAGNOSIS — E875 Hyperkalemia: Secondary | ICD-10-CM | POA: Diagnosis not present

## 2023-12-07 DIAGNOSIS — D631 Anemia in chronic kidney disease: Secondary | ICD-10-CM | POA: Diagnosis not present

## 2023-12-07 DIAGNOSIS — I130 Hypertensive heart and chronic kidney disease with heart failure and stage 1 through stage 4 chronic kidney disease, or unspecified chronic kidney disease: Secondary | ICD-10-CM | POA: Diagnosis not present

## 2023-12-07 DIAGNOSIS — Z66 Do not resuscitate: Secondary | ICD-10-CM | POA: Diagnosis not present

## 2023-12-07 DIAGNOSIS — D519 Vitamin B12 deficiency anemia, unspecified: Secondary | ICD-10-CM | POA: Diagnosis not present

## 2023-12-07 DIAGNOSIS — E038 Other specified hypothyroidism: Secondary | ICD-10-CM | POA: Diagnosis not present

## 2023-12-07 DIAGNOSIS — E782 Mixed hyperlipidemia: Secondary | ICD-10-CM | POA: Diagnosis not present

## 2023-12-07 DIAGNOSIS — N189 Chronic kidney disease, unspecified: Secondary | ICD-10-CM | POA: Diagnosis not present

## 2023-12-07 DIAGNOSIS — L89153 Pressure ulcer of sacral region, stage 3: Secondary | ICD-10-CM | POA: Diagnosis not present

## 2023-12-07 DIAGNOSIS — K921 Melena: Secondary | ICD-10-CM | POA: Diagnosis not present

## 2023-12-07 DIAGNOSIS — F0394 Unspecified dementia, unspecified severity, with anxiety: Secondary | ICD-10-CM | POA: Diagnosis not present

## 2023-12-07 DIAGNOSIS — Z7983 Long term (current) use of bisphosphonates: Secondary | ICD-10-CM | POA: Diagnosis not present

## 2023-12-07 DIAGNOSIS — J449 Chronic obstructive pulmonary disease, unspecified: Secondary | ICD-10-CM | POA: Diagnosis not present

## 2023-12-07 DIAGNOSIS — J9601 Acute respiratory failure with hypoxia: Secondary | ICD-10-CM | POA: Diagnosis not present

## 2023-12-07 DIAGNOSIS — Z1152 Encounter for screening for COVID-19: Secondary | ICD-10-CM | POA: Diagnosis not present

## 2023-12-07 DIAGNOSIS — Z8249 Family history of ischemic heart disease and other diseases of the circulatory system: Secondary | ICD-10-CM | POA: Diagnosis not present

## 2023-12-07 DIAGNOSIS — I480 Paroxysmal atrial fibrillation: Secondary | ICD-10-CM | POA: Diagnosis not present

## 2023-12-07 DIAGNOSIS — E559 Vitamin D deficiency, unspecified: Secondary | ICD-10-CM | POA: Diagnosis not present

## 2023-12-07 DIAGNOSIS — E872 Acidosis, unspecified: Secondary | ICD-10-CM | POA: Diagnosis not present

## 2023-12-07 DIAGNOSIS — I10 Essential (primary) hypertension: Secondary | ICD-10-CM | POA: Diagnosis not present

## 2023-12-07 DIAGNOSIS — Z7982 Long term (current) use of aspirin: Secondary | ICD-10-CM | POA: Diagnosis not present

## 2023-12-07 DIAGNOSIS — F411 Generalized anxiety disorder: Secondary | ICD-10-CM | POA: Diagnosis not present

## 2023-12-07 DIAGNOSIS — M4854XA Collapsed vertebra, not elsewhere classified, thoracic region, initial encounter for fracture: Secondary | ICD-10-CM | POA: Diagnosis not present

## 2023-12-07 LAB — CBC
HCT: 29 % — ABNORMAL LOW (ref 36.0–46.0)
Hemoglobin: 9.2 g/dL — ABNORMAL LOW (ref 12.0–15.0)
MCH: 29.6 pg (ref 26.0–34.0)
MCHC: 31.7 g/dL (ref 30.0–36.0)
MCV: 93.2 fL (ref 80.0–100.0)
Platelets: 162 10*3/uL (ref 150–400)
RBC: 3.11 MIL/uL — ABNORMAL LOW (ref 3.87–5.11)
RDW: 13.9 % (ref 11.5–15.5)
WBC: 9.3 10*3/uL (ref 4.0–10.5)
nRBC: 0 % (ref 0.0–0.2)

## 2023-12-07 LAB — BASIC METABOLIC PANEL WITH GFR
Anion gap: 6 (ref 5–15)
BUN: 60 mg/dL — ABNORMAL HIGH (ref 8–23)
CO2: 21 mmol/L — ABNORMAL LOW (ref 22–32)
Calcium: 9.2 mg/dL (ref 8.9–10.3)
Chloride: 110 mmol/L (ref 98–111)
Creatinine, Ser: 2.28 mg/dL — ABNORMAL HIGH (ref 0.44–1.00)
GFR, Estimated: 20 mL/min — ABNORMAL LOW (ref >60)
Glucose, Bld: 103 mg/dL — ABNORMAL HIGH (ref 70–99)
Potassium: 5.2 mmol/L — ABNORMAL HIGH (ref 3.5–5.1)
Sodium: 137 mmol/L (ref 135–145)

## 2023-12-07 LAB — GLUCOSE, CAPILLARY
Glucose-Capillary: 135 mg/dL — ABNORMAL HIGH (ref 70–99)
Glucose-Capillary: 132 mg/dL — ABNORMAL HIGH (ref 70–99)

## 2023-12-07 MED ORDER — SODIUM ZIRCONIUM CYCLOSILICATE 10 G PO PACK
10.0000 g | PACK | Freq: Every day | ORAL | Status: DC
  Administered 2023-12-07: 10 g via ORAL
  Filled 2023-12-07 (×2): qty 1

## 2023-12-07 MED ORDER — CITALOPRAM HYDROBROMIDE 20 MG PO TABS
20.0000 mg | ORAL_TABLET | Freq: Every day | ORAL | Status: DC
  Administered 2023-12-08 – 2023-12-14 (×7): 20 mg via ORAL
  Filled 2023-12-07 (×7): qty 1

## 2023-12-07 MED ORDER — SODIUM CHLORIDE 0.9 % IV SOLN: INTRAVENOUS | Status: AC

## 2023-12-07 MED ORDER — TIMOLOL MALEATE 0.5 % OP SOLN
1.0000 [drp] | Freq: Two times a day (BID) | OPHTHALMIC | Status: DC
  Administered 2023-12-07 – 2023-12-13 (×13): 1 [drp] via OPHTHALMIC
  Filled 2023-12-07: qty 5

## 2023-12-07 NOTE — Progress Notes (Signed)
 PT Cancellation Note  Patient Details Name: Audrey Duran MRN: [id number] DOB: Jan 29, 1935   Cancelled Treatment:    Reason Eval/Treat Not Completed: Other (comment): Orders Received. Chart Reviewed. Patient received supine in bed asleep, awakes to therapist voice. Patient requesting therapist to come back at later date/time, endorsing fatigue. Therapist will re-attempt evaluation at later date/time.    Kianna Billet M Fairly, PT, DPT 12/07/23 2:44 PM

## 2023-12-07 NOTE — Plan of Care (Signed)
  Problem: Education: Goal: Knowledge of General Education information will improve Description: Including pain rating scale, medication(s)/side effects and non-pharmacologic comfort measures Outcome: Not Progressing   Problem: Health Behavior/Discharge Planning: Goal: Ability to manage health-related needs will improve Outcome: Not Progressing   Problem: Clinical Measurements: Goal: Ability to maintain clinical measurements within normal limits will improve Outcome: Progressing Goal: Will remain free from infection Outcome: Progressing Goal: Diagnostic test results will improve Outcome: Progressing Goal: Respiratory complications will improve Outcome: Progressing Goal: Cardiovascular complication will be avoided Outcome: Progressing   Problem: Activity: Goal: Risk for activity intolerance will decrease Outcome: Progressing   Problem: Nutrition: Goal: Adequate nutrition will be maintained Outcome: Progressing   Problem: Coping: Goal: Level of anxiety will decrease Outcome: Progressing   Problem: Elimination: Goal: Will not experience complications related to bowel motility Outcome: Progressing Goal: Will not experience complications related to urinary retention Outcome: Progressing   Problem: Pain Managment: Goal: General experience of comfort will improve and/or be controlled Outcome: Progressing   Problem: Safety: Goal: Ability to remain free from injury will improve Outcome: Progressing   Problem: Skin Integrity: Goal: Risk for impaired skin integrity will decrease Outcome: Progressing

## 2023-12-07 NOTE — Progress Notes (Signed)
  PROGRESS NOTE    LAKASHIA FOSCO  WUX:[id number] DOB: Jan 12, 1935 DOA: 12/06/2023 PCP: Thersia Flax, MD  121A/121A-AA  LOS: 0 days   Brief hospital course:   Assessment & Plan: Jamilah Hajj is an 89 year old female with history of paroxysmal atrial fibrillation on aspirin , history of hypertension, dementia, chronic kidney stage IIIb, hyperlipidemia, with history of hypertension, who presents to emergency department for chief concerns of fall at [facility] via EMS.    * AKI (acute kidney injury) (HCC) On CKD 3b --cont MIVF  Acute hypoxic respiratory failure (HCC) --unclear etiology.  CXR no active disease. Low clinical suspicion for heart failure as BNP is low compared to prior values --Continue supplemental O2 to keep sats >=92%, wean as tolerated  Hypotension --hold BP meds for now  COPD (chronic obstructive pulmonary disease) (HCC) --does not appear to be in exacerbation.    Falling --PT/OT  Hyperkalemia Status post Lokelma 10 g per EDP --cont Lokelma 10 g daily for 2 more doses  Dementia --delirium precaution    DVT prophylaxis: Heparin SQ Code Status: DNR  Family Communication: family updated at bedside today Level of care: Med-Surg Dispo:   The patient is from: memory care unit Anticipated d/c is to: to be determined Anticipated d/c date is: 1-2 days   Subjective and Interval History:  Pt reported hurting all over.  No dyspnea.   Objective: Vitals:   12/07/23 1144 12/07/23 1530 12/07/23 2020 12/07/23 2020  BP: (!) 123/57 (!) 111/42 (!) 137/42 (!) 137/42  Pulse: 68 66 71 71  Resp: 16 16 14 14   Temp: 98 F (36.7 C) 97.7 F (36.5 C) 98.2 F (36.8 C) 98.2 F (36.8 C)  TempSrc: Oral  Oral Oral  SpO2: 100% 100% 100% 100%  Height:        Intake/Output Summary (Last 24 hours) at 12/07/2023 2139 Last data filed at 12/07/2023 1528 Gross per 24 hour  Intake 575.9 ml  Output --  Net 575.9 ml   There were no vitals filed for this  visit.  Examination:   Constitutional: NAD, alert, oriented to person and place HEENT: conjunctivae and lids normal, EOMI CV: No cyanosis.   RESP: normal respiratory effort Neuro: II - XII grossly intact.     Data Reviewed: I have personally reviewed labs and imaging studies  Time spent: 50 minutes  Garrison Kanner, MD [facility] If 7PM-7AM, please contact night-coverage 12/07/2023, 9:39 PM

## 2023-12-08 DIAGNOSIS — N189 Chronic kidney disease, unspecified: Secondary | ICD-10-CM

## 2023-12-08 DIAGNOSIS — I480 Paroxysmal atrial fibrillation: Secondary | ICD-10-CM | POA: Diagnosis not present

## 2023-12-08 DIAGNOSIS — L8990 Pressure ulcer of unspecified site, unspecified stage: Secondary | ICD-10-CM | POA: Insufficient documentation

## 2023-12-08 DIAGNOSIS — I951 Orthostatic hypotension: Secondary | ICD-10-CM

## 2023-12-08 DIAGNOSIS — N179 Acute kidney failure, unspecified: Secondary | ICD-10-CM | POA: Diagnosis not present

## 2023-12-08 LAB — CBC
HCT: 29.7 % — ABNORMAL LOW (ref 36.0–46.0)
Hemoglobin: 9.4 g/dL — ABNORMAL LOW (ref 12.0–15.0)
MCH: 29.6 pg (ref 26.0–34.0)
MCHC: 31.6 g/dL (ref 30.0–36.0)
MCV: 93.4 fL (ref 80.0–100.0)
Platelets: 158 10*3/uL (ref 150–400)
RBC: 3.18 MIL/uL — ABNORMAL LOW (ref 3.87–5.11)
RDW: 13.6 % (ref 11.5–15.5)
WBC: 9.7 10*3/uL (ref 4.0–10.5)
nRBC: 0 % (ref 0.0–0.2)

## 2023-12-08 LAB — BASIC METABOLIC PANEL WITH GFR
Anion gap: 3 — ABNORMAL LOW (ref 5–15)
BUN: 48 mg/dL — ABNORMAL HIGH (ref 8–23)
CO2: 23 mmol/L (ref 22–32)
Calcium: 9.3 mg/dL (ref 8.9–10.3)
Chloride: 109 mmol/L (ref 98–111)
Creatinine, Ser: 1.75 mg/dL — ABNORMAL HIGH (ref 0.44–1.00)
GFR, Estimated: 28 mL/min — ABNORMAL LOW (ref >60)
Glucose, Bld: 114 mg/dL — ABNORMAL HIGH (ref 70–99)
Potassium: 4.5 mmol/L (ref 3.5–5.1)
Sodium: 135 mmol/L (ref 135–145)

## 2023-12-08 LAB — MAGNESIUM: Magnesium: 1.9 mg/dL (ref 1.7–2.4)

## 2023-12-08 MED ORDER — MIDODRINE HCL 5 MG PO TABS
5.0000 mg | ORAL_TABLET | Freq: Three times a day (TID) | ORAL | Status: DC
  Administered 2023-12-08: 5 mg via ORAL
  Filled 2023-12-08: qty 1

## 2023-12-08 NOTE — Evaluation (Signed)
 Physical Therapy Evaluation Patient Details Name: Audrey Duran MRN: [id number] DOB: 1935/03/07 Today's Date: 12/08/2023  History of Present Illness  presented to ER secondary to fall in home environment; admitted for management of AKI  Clinical Impression  Patient seated in recliner upon arrival to room; supportive daughter present at bedside.  Patient alert and oriented to self only; follows simple commands, but does require frequent redirection to situation, task at hand.  Denies acute pain.  Bilat UE/LE generally weak and deconditioned; no focal weakness appreciated.  Able to complete sit/stand, standing balance with RW, min/mod assist.  Does require UE support to complete; manual assist from therapist to correct posterior bias with static standing.  Endorses progressive increase in dizziness/lightheadedness with transition to standing (unable to tolerate greater than 10-15 seconds), requiring return to seated position for recovery.  Additional mobility/gait efforts deferred due to symptomatic orthostasis; RN/MD informed and aware. Would benefit from skilled PT to address above deficits and promote optimal return to PLOF.; recommend post-acute PT follow up as indicated by interdisciplinary care team.     Orthostatic VS for the past 24 hrs (Last 3 readings):  BP- Sitting Pulse- Sitting BP- Standing at 0 minutes Pulse- Standing at 0 minutes  12/08/23 1615 (!) 82/41 63 (!) 74/34 76           If plan is discharge home, recommend the following: A lot of help with walking and/or transfers;A lot of help with bathing/dressing/bathroom   Can travel by private vehicle   No    Equipment Recommendations Rolling walker (2 wheels)  Recommendations for Other Services       Functional Status Assessment Patient has had a recent decline in their functional status and demonstrates the ability to make significant improvements in function in a reasonable and predictable amount of time.      Precautions / Restrictions Precautions Precautions: Fall Restrictions Weight Bearing Restrictions Per Provider Order: No      Mobility  Bed Mobility               General bed mobility comments: seated in a recliner beginning/end of treatment session    Transfers Overall transfer level: Needs assistance Equipment used: Rolling walker (2 wheels) Transfers: Sit to/from Stand Sit to Stand: Mod assist, Min assist           General transfer comment: cuing for hand placement; assist for lift off, standing balance (posterior weight shift)    Ambulation/Gait               General Gait Details: unsafe/unable due to symptomatic orthostasis  Stairs            Wheelchair Mobility     Tilt Bed    Modified Rankin (Stroke Patients Only)       Balance Overall balance assessment: Needs assistance Sitting-balance support: No upper extremity supported, Feet supported Sitting balance-Leahy Scale: Fair     Standing balance support: Bilateral upper extremity supported Standing balance-Leahy Scale: Poor                               Pertinent Vitals/Pain Pain Assessment Pain Assessment: No/denies pain    Home Living Family/patient expects to be discharged to:: Assisted living                   Additional Comments: [place] [address] (x1 year)    Prior Function Prior Level of Function :  Needs assist;Patient poor historian/Family not available             Mobility Comments: Per daughter, ambulatory without assist device for basic mobilization; does endorse general decline over last six months.  Typically ambulates to/from dining hall (~100') for all meals; assist from staff as needed for ADLs.  Does endorse at least 2 falls in previous year       Extremity/Trunk Assessment   Upper Extremity Assessment Upper Extremity Assessment: Generalized weakness    Lower Extremity Assessment Lower Extremity  Assessment: Generalized weakness (grossly 4-/5 throughout; no focal weakness appreciated)       Communication        Cognition Arousal: Alert Behavior During Therapy: Flat affect   PT - Cognitive impairments: History of cognitive impairments                       PT - Cognition Comments: Oriented to self only; follows simple commands, frequent redirection to task         Cueing       General Comments      Exercises     Assessment/Plan    PT Assessment Patient needs continued PT services  PT Problem List Decreased activity tolerance;Decreased balance;Decreased mobility;Decreased cognition;Decreased knowledge of use of DME;Decreased knowledge of precautions;Decreased safety awareness       PT Treatment Interventions DME instruction;Gait training;Stair training;Functional mobility training;Therapeutic activities;Therapeutic exercise;Patient/family education;Balance training    PT Goals (Current goals can be found in the Care Plan section)  Acute Rehab PT Goals Patient Stated Goal: to return home PT Goal Formulation: With patient Time For Goal Achievement: 12/22/23 Potential to Achieve Goals: Good    Frequency Min 2X/week     Co-evaluation               AM-PAC PT "6 Clicks" Mobility  Outcome Measure Help needed turning from your back to your side while in a flat bed without using bedrails?: A Little Help needed moving from lying on your back to sitting on the side of a flat bed without using bedrails?: A Little Help needed moving to and from a bed to a chair (including a wheelchair)?: A Lot Help needed standing up from a chair using your arms (e.g., wheelchair or bedside chair)?: A Lot Help needed to walk in hospital room?: Total Help needed climbing 3-5 steps with a railing? : Total 6 Click Score: 12    End of Session Equipment Utilized During Treatment: Gait belt Activity Tolerance: Patient tolerated treatment well Patient left: in chair;with  call bell/phone within reach;with chair alarm set;with family/visitor present Nurse Communication: Mobility status PT Visit Diagnosis: Muscle weakness (generalized) (M62.81);Difficulty in walking, not elsewhere classified (R26.2)    Time: 6578-4696 PT Time Calculation (min) (ACUTE ONLY): 24 min   Charges:   PT Evaluation $PT Eval Moderate Complexity: 1 Mod PT Treatments $Therapeutic Activity: 8-22 mins PT General Charges $$ ACUTE PT VISIT: 1 Visit         Jontay Maston H. Bevin Bucks, PT, DPT, NCS 12/08/23, 4:23 PM [phone]

## 2023-12-08 NOTE — Plan of Care (Signed)

## 2023-12-08 NOTE — Progress Notes (Signed)
 Progress Note   Patient: Audrey Duran EAV:[id number] DOB: 20-Aug-1934 DOA: 12/06/2023     1 DOS: the patient was seen and examined on 12/08/2023   Brief hospital course: Mrs. Khaleyah Qasem is an 89 year old female with history of paroxysmal atrial fibrillation on aspirin , history of hypertension, dementia, chronic kidney stage IIIb, hyperlipidemia, with history of hypertension, who presents to emergency department for chief concerns of fall at [facility] via EMS. She was found to have acute renal failure, bladder scan showed residual of 962, Foley catheter anchored.  Patient was also found to have profound orthostatic hypotension.   Principal Problem:   AKI (acute kidney injury) (HCC) Active Problems:   COPD (chronic obstructive pulmonary disease) (HCC)   Orthostatic hypotension   Acute kidney injury superimposed on chronic kidney disease (HCC)   Acute hypoxic respiratory failure (HCC)   Anxiety state   Paroxysmal atrial fibrillation (HCC)   Hyperkalemia   CAD (coronary artery disease), native coronary artery   Essential hypertension   Protein-calorie malnutrition (HCC)   Compression fracture of T11 vertebra (HCC)   (HFpEF) heart failure with preserved ejection fraction (HCC)   CKD stage 3b, GFR 30-44 ml/min (HCC)   Falling   Pressure injury of skin   Assessment and Plan: Fall at home, likely syncope and collapse. Orthostatic hypotension. Patient had a fall at home, she has a profound orthostatic hypotension, she probably had a syncope collapse at home. I will start midodrine  for now.  Continue to follow blood pressure. Check a cortisol level tomorrow.  Acute kidney injury on chronic kidney stage IIIb. Severe urinary retention. Hyperkalemia secondary to urinary tension. Patient received the fluids, renal functions better.  However, patient had a significant urinary retention with residual 962 mL.  Foley catheter anchored.  Check a UA and urine culture to make sure  patient does not have acute urinary infection causing urinary retention. Potassium normalized.  Hypoxemic resp failure ruled out. COPD. COPD stable, reviewed chart, patient has no documented hypoxemia.  Patient also has no short of breath.  Essential hypertension. All medicines are on hold due to low blood pressure.  Dementia. Continue to follow.  Pressure ulcer POA. Pressure Injury 12/08/23 Sacrum Medial Stage 3 -  Full thickness tissue loss. Subcutaneous fat may be visible but bone, tendon or muscle are NOT exposed. WOC assessment; changed Staging; reviewed 2 nurse skin assessment (POA) (Active)  12/08/23 (Reported from previous shift, present on admission, present prior to arrival of my shift.) 0745  Location: Sacrum  Location Orientation: Medial  Staging: Stage 3 -  Full thickness tissue loss. Subcutaneous fat may be visible but bone, tendon or muscle are NOT exposed.  Wound Description (Comments): WOC assessment; changed Staging; reviewed 2 nurse skin assessment (POA)  Present on Admission: Yes        Subjective:  Patient is confused, no agitation.  Physical Exam: Vitals:   12/07/23 2020 12/08/23 0006 12/08/23 0435 12/08/23 0755  BP: (!) 137/42 (!) 145/75 (!) 136/50 (!) 119/40  Pulse: 71 70 78 64  Resp: 14 16 16 18   Temp: 98.2 F (36.8 C) 98.6 F (37 C) 98.9 F (37.2 C) (!) 97.5 F (36.4 C)  TempSrc: Oral Oral Oral   SpO2: 100% 100% 99% 91%  Height:       General exam: Appears calm and comfortable  Respiratory system: Clear to auscultation. Respiratory effort normal. Cardiovascular system: S1 & S2 heard, RRR. No JVD, murmurs, rubs, gallops or clicks. No pedal edema. Gastrointestinal system: Abdomen  is nondistended, soft and nontender. No organomegaly or masses felt. Normal bowel sounds heard. Central nervous system: Alert and oriented x1. No focal neurological deficits. Extremities: Symmetric 5 x 5 power. Skin: No rashes, lesions or ulcers Psychiatry: Flat  affect   Data Reviewed:  Lab results reviewed.  Family Communication: Daughter updated at bedside.  Disposition: Status is: Inpatient Remains inpatient appropriate because: Severity of disease,     Time spent: 55 minutes  Author: Donaciano Frizzle, MD 12/08/2023 2:56 PM  For on call review [email or web address].

## 2023-12-08 NOTE — Consult Note (Signed)
 WOC Nurse Consult Note: Reason for Consult: Stage 2 Pressure Injury Wound type: Stage 3 Pressure Injury; full thickness Pressure Injury POA: Yes; noted in nursing documentation during 2 nurse skin assessment  Measurement: see nursing flow sheets Wound bed: clean, pink, moist Drainage (amount, consistency, odor) see nursing flow sheets Periwound: intact  Dressing procedure/placement/frequency: Cleanse sacral wound with saline, apply single layer of xeroform and top with foam. Change every other day and PRN soilage.   Turn and reposition patient based on hospital policy   Re consult if needed, will not follow at this time. Thanks  Lanny Lipkin [employer], RN,CWOCN, CNS, CWON-AP [phone])

## 2023-12-09 DIAGNOSIS — N189 Chronic kidney disease, unspecified: Secondary | ICD-10-CM | POA: Diagnosis not present

## 2023-12-09 DIAGNOSIS — I951 Orthostatic hypotension: Secondary | ICD-10-CM | POA: Diagnosis not present

## 2023-12-09 DIAGNOSIS — R296 Repeated falls: Secondary | ICD-10-CM | POA: Diagnosis not present

## 2023-12-09 DIAGNOSIS — N179 Acute kidney failure, unspecified: Secondary | ICD-10-CM | POA: Diagnosis not present

## 2023-12-09 LAB — URINALYSIS, COMPLETE (UACMP) WITH MICROSCOPIC
Bilirubin Urine: NEGATIVE
Glucose, UA: NEGATIVE mg/dL
Hgb urine dipstick: NEGATIVE
Ketones, ur: NEGATIVE mg/dL
Leukocytes,Ua: NEGATIVE
Nitrite: NEGATIVE
Protein, ur: NEGATIVE mg/dL
Specific Gravity, Urine: 1.014 (ref 1.005–1.030)
pH: 6 (ref 5.0–8.0)

## 2023-12-09 LAB — BASIC METABOLIC PANEL WITH GFR
Anion gap: 9 (ref 5–15)
BUN: 41 mg/dL — ABNORMAL HIGH (ref 8–23)
CO2: 21 mmol/L — ABNORMAL LOW (ref 22–32)
Calcium: 9.4 mg/dL (ref 8.9–10.3)
Chloride: 106 mmol/L (ref 98–111)
Creatinine, Ser: 1.6 mg/dL — ABNORMAL HIGH (ref 0.44–1.00)
GFR, Estimated: 31 mL/min — ABNORMAL LOW (ref >60)
Glucose, Bld: 101 mg/dL — ABNORMAL HIGH (ref 70–99)
Potassium: 4.5 mmol/L (ref 3.5–5.1)
Sodium: 136 mmol/L (ref 135–145)

## 2023-12-09 LAB — URINE CULTURE: Culture: NO GROWTH

## 2023-12-09 LAB — CBC
HCT: 28.9 % — ABNORMAL LOW (ref 36.0–46.0)
Hemoglobin: 9.1 g/dL — ABNORMAL LOW (ref 12.0–15.0)
MCH: 29.3 pg (ref 26.0–34.0)
MCHC: 31.5 g/dL (ref 30.0–36.0)
MCV: 92.9 fL (ref 80.0–100.0)
Platelets: 158 10*3/uL (ref 150–400)
RBC: 3.11 MIL/uL — ABNORMAL LOW (ref 3.87–5.11)
RDW: 13.7 % (ref 11.5–15.5)
WBC: 9.1 10*3/uL (ref 4.0–10.5)
nRBC: 0 % (ref 0.0–0.2)

## 2023-12-09 LAB — CORTISOL: Cortisol, Plasma: 11.7 ug/dL

## 2023-12-09 LAB — MAGNESIUM: Magnesium: 1.8 mg/dL (ref 1.7–2.4)

## 2023-12-09 MED ORDER — SODIUM BICARBONATE 650 MG PO TABS
650.0000 mg | ORAL_TABLET | Freq: Two times a day (BID) | ORAL | Status: DC
  Administered 2023-12-09 – 2023-12-13 (×10): 650 mg via ORAL
  Filled 2023-12-09 (×10): qty 1

## 2023-12-09 MED ORDER — MIDODRINE HCL 5 MG PO TABS
2.5000 mg | ORAL_TABLET | Freq: Three times a day (TID) | ORAL | Status: DC
  Administered 2023-12-09 – 2023-12-11 (×7): 2.5 mg via ORAL
  Filled 2023-12-09 (×7): qty 1

## 2023-12-09 MED ORDER — CHLORHEXIDINE GLUCONATE CLOTH 2 % EX PADS
6.0000 | MEDICATED_PAD | Freq: Every day | CUTANEOUS | Status: DC
  Administered 2023-12-09 – 2023-12-14 (×6): 6 via TOPICAL

## 2023-12-09 NOTE — Plan of Care (Signed)

## 2023-12-09 NOTE — Progress Notes (Signed)
 Patient's daughter, Royale Alfred, provided update via phone call. Informed Ms. Spreen patient ate her sausage for breakfast, half the biscuit, and drank a full cup of coffee. Informed Ms. Hilliker patient prescribed midodrine  for orthostatic hypotension. Patient refused to get out of bed for breakfast and requested to take a nap after she finished breakfast.

## 2023-12-09 NOTE — Plan of Care (Signed)

## 2023-12-09 NOTE — Progress Notes (Addendum)
 Progress Note   Patient: Audrey Duran WUJ:[id number] DOB: 1934-08-31 DOA: 12/06/2023     2 DOS: the patient was seen and examined on 12/09/2023   Brief hospital course: Mrs. Irma Byerly is an 89 year old female with history of paroxysmal atrial fibrillation on aspirin , history of hypertension, dementia, chronic kidney stage IIIb, hyperlipidemia, with history of hypertension, who presents to emergency department for chief concerns of fall at [facility] via EMS. She was found to have acute renal failure, bladder scan showed residual of 962, Foley catheter anchored.  Patient was also found to have profound orthostatic hypotension.  Midodrine  was started. Patient will need nursing home placement.   Principal Problem:   AKI (acute kidney injury) (HCC) Active Problems:   COPD (chronic obstructive pulmonary disease) (HCC)   Orthostatic hypotension   Acute kidney injury superimposed on chronic kidney disease (HCC)   Acute hypoxic respiratory failure (HCC)   Anxiety state   Paroxysmal atrial fibrillation (HCC)   Hyperkalemia   CAD (coronary artery disease), native coronary artery   Essential hypertension   Protein-calorie malnutrition (HCC)   Compression fracture of T11 vertebra (HCC)   (HFpEF) heart failure with preserved ejection fraction (HCC)   CKD stage 3b, GFR 30-44 ml/min (HCC)   Falling   Pressure injury of skin   Assessment and Plan: Fall at home, likely syncope and collapse. Orthostatic hypotension. Patient had a fall at home, she has a profound orthostatic hypotension, she probably had a syncope collapse at home. Midodrine  was started on 5/14, continue to follow orthostatic vital signs.  Cortisol level 11.7, no significant adrenal insufficiency. Patient has been evaluated by PT/OT, recommending nursing home placement.   Acute kidney injury on chronic kidney stage IIIb. Severe urinary retention. Hyperkalemia secondary to urinary tension. Patient received the  fluids, renal functions better.  However, patient had a significant urinary retention with residual 962 mL.  Foley catheter anchored.  UA does not support a urinary tract infection diagnosis. Patient will be followed with urology in 2 weeks after discharge.  Patient now has mild metabolic acidosis, will start sodium bicarb.   Hypoxemic resp failure ruled out. COPD. COPD stable, reviewed chart, patient has no documented hypoxemia.  Patient also has no short of breath.   Essential hypertension. All medicines are on hold due to low blood pressure.   Dementia. Continue to follow.   Pressure ulcer POA. Pressure Injury 12/08/23 Sacrum Medial Stage 3 -  Full thickness tissue loss. Subcutaneous fat may be visible but bone, tendon or muscle are NOT exposed. WOC assessment; changed Staging; reviewed 2 nurse skin assessment (POA) (Active)  12/08/23 (Reported from previous shift, present on admission, present prior to arrival of my shift.) 0745  Location: Sacrum  Location Orientation: Medial  Staging: Stage 3 -  Full thickness tissue loss. Subcutaneous fat may be visible but bone, tendon or muscle are NOT exposed.  Wound Description (Comments): WOC assessment; changed Staging; reviewed 2 nurse skin assessment (POA)  Present on Admission: Yes               Subjective:  Patient is confused as at baseline.  No significant short of breath.  Physical Exam: Vitals:   12/08/23 1604 12/08/23 1944 12/09/23 0533 12/09/23 0833  BP: (!) 106/40 (!) 118/43 (!) 134/47 (!) 119/51  Pulse: 66 63 62 63  Resp: 18   16  Temp: 98.1 F (36.7 C) 98.4 F (36.9 C) 97.7 F (36.5 C) 97.6 F (36.4 C)  TempSrc:   Oral  Oral  SpO2: 98% 92% 100% 97%  Height:       General exam: Appears calm and comfortable  Respiratory system: Decreased breath sounds. Respiratory effort normal. Cardiovascular system: S1 & S2 heard, RRR. No JVD, murmurs, rubs, gallops or clicks. No pedal edema. Gastrointestinal system: Abdomen  is nondistended, soft and nontender. No organomegaly or masses felt. Normal bowel sounds heard. Central nervous system: Alert and oriented x2. No focal neurological deficits. Extremities: Symmetric 5 x 5 power. Skin: No rashes, lesions or ulcers Psychiatry:Mood & affect appropriate.    Data Reviewed:  Lab results reviewed.  Family Communication: daughter updated over the phone  Disposition: Status is: Inpatient Remains inpatient appropriate because: Severity of disease, unsafe discharge.     Time spent: 35 minutes  Author: Donaciano Frizzle, MD 12/09/2023 11:15 AM  For on call review [email or web address].

## 2023-12-10 DIAGNOSIS — I10 Essential (primary) hypertension: Secondary | ICD-10-CM | POA: Diagnosis not present

## 2023-12-10 DIAGNOSIS — J9601 Acute respiratory failure with hypoxia: Secondary | ICD-10-CM

## 2023-12-10 DIAGNOSIS — I951 Orthostatic hypotension: Secondary | ICD-10-CM | POA: Diagnosis not present

## 2023-12-10 NOTE — NC FL2 (Signed)
 [place]  MEDICAID [id number] LEVEL OF CARE FORM     IDENTIFICATION  Patient Name: Audrey Duran Birthdate: 1934/12/23 Sex: female Admission Date (Current Location): 12/06/2023  [facility] and [place] Number:  Chiropodist and Address:  [facility], [address] [place], [place] [address]      Provider Number: [id number]  Attending Physician Name and Address:  Donaciano Frizzle, MD  Relative Name and Phone Number:  Trenity Kurian, daughter, phone: [phone]    Current Level of Care: Hospital Recommended Level of Care: Skilled Nursing Facility Prior Approval Number:    Date Approved/Denied: 02/17/12 PASRR Number: [id number]  Discharge Plan: SNF    Current Diagnoses: Patient Active Problem List   Diagnosis Date Noted   Pressure injury of skin 12/08/2023   AKI (acute kidney injury) (HCC) 12/06/2023   CKD stage 3b, GFR 30-44 ml/min (HCC) 12/06/2023   Falling 12/06/2023   Acute hypoxic respiratory failure (HCC) 08/25/2023   Acute gastroenteritis 08/24/2023   Bradycardia 08/24/2023   (HFpEF) heart failure with preserved ejection fraction (HCC) 08/24/2023   Cough 08/24/2023   Compression fracture of T11 vertebra (HCC) 03/05/2023   Impaired ambulation 03/05/2023   Dementia with agitation (HCC) 10/13/2022   Altered mental status 10/07/2022   Protein-calorie malnutrition (HCC) 10/07/2022   Decreased appetite 10/07/2022   Acute kidney injury superimposed on chronic kidney disease (HCC) 04/27/2022   Essential hypertension 10/09/2020   Aortic atherosclerosis (HCC) 10/08/2020   Grief at loss of child 04/05/2020   Orthostatic hypotension 03/03/2020   S/P laparoscopic appendectomy 02/27/2020   History of herpes labialis 02/05/2020   Lower extremity edema 02/01/2020   Delusional disorder (HCC) 01/30/2020   Psychosis, 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   Unintentional weight loss 04/16/2019   Episodes of formed visual hallucinations 10/12/2018   Abnormal  mammogram of right breast 04/08/2018   Impaired fasting glucose 08/29/2017   Prediabetes 05/21/2016   History of vertigo 11/16/2015   Hospital discharge follow-up 04/19/2015   History of Clostridium difficile colitis 12/14/2014   Long-term use of high-risk medication 05/08/2014   Cardiomyopathy (HCC) 02/15/2014   CHF (congestive heart failure) (HCC) 02/15/2014   H/O carotid endarterectomy 02/15/2014   S/P TAH-BSO (total abdominal hysterectomy and bilateral salpingo-oophorectomy) 10/26/2013   Vitamin D  deficiency 10/12/2013   CAD (coronary artery disease), native coronary artery 11/18/2012   Eosinophilia 09/21/2012   Hyperkalemia 09/21/2012   Anxiety state 09/20/2012   Osteopenia of the elderly 06/20/2012   At high risk for falls 06/20/2012   Encounter for preventive health examination 04/25/2012   Atrophic vaginitis 01/17/2012   Paroxysmal atrial fibrillation (HCC)    Recurrent HSV (herpes simplex virus)    COPD (chronic obstructive pulmonary disease) (HCC)    Ischemic cardiomyopathy    Cystocele or rectocele with complete uterine prolapse 08/02/2011   Screening for breast cancer 07/31/2011   Screening for cervical cancer 07/31/2011   Screening for colon cancer 07/31/2011   Coronary artery disease    History of myocardial infarction    Hyperlipidemia LDL goal <70     Orientation RESPIRATION BLADDER Height & Weight     Place, Self  Normal Indwelling catheter Weight:   Height:  5\' 3"  (160 cm)  BEHAVIORAL SYMPTOMS/MOOD NEUROLOGICAL BOWEL NUTRITION STATUS      Incontinent Diet (Please see discharge summary)  AMBULATORY STATUS COMMUNICATION OF NEEDS Skin   Limited Assist Verbally  (Dry, Flaky, non-tenting)  Personal Care Assistance Level of Assistance  Bathing, Feeding, Dressing, Total care Bathing Assistance: Limited assistance Feeding assistance: Limited assistance Dressing Assistance: Limited assistance Total Care Assistance: Limited assistance    Functional Limitations Info             SPECIAL CARE FACTORS FREQUENCY  PT (By licensed PT), OT (By licensed OT)     PT Frequency: 5 x per week OT Frequency: 5 x per week            Contractures      Additional Factors Info  Code Status, Allergies Code Status Info: DNR Allergies Info: Azithromycin  No severity or reactions specified Comments  Clarithromycin  No severity or reactions specified Comments  Diclofenac  No severity or reactions specified Comments  Lisinopril   No severity specified - Other (See Comments) Comments  Meloxicam  No severity or reactions specified Comments  Nitrofurantoin Monohyd Macro  No severity or reactions specified Comments  Zocor (simvastatin)  No severity or reactions specified  Penicillins  Low - Rash           Current Medications (12/10/2023):  This is the current hospital active medication list Current Facility-Administered Medications  Medication Dose Route Frequency Provider Last Rate Last Admin   acetaminophen  (TYLENOL ) tablet 650 mg  650 mg Oral Q6H PRN Cox, Amy N, DO       Or   acetaminophen  (TYLENOL ) suppository 650 mg  650 mg Rectal Q6H PRN Cox, Amy N, DO       Chlorhexidine  Gluconate Cloth 2 % PADS 6 each  6 each Topical Daily Donaciano Frizzle, MD   6 each at 12/10/23 1059   citalopram  (CELEXA ) tablet 20 mg  20 mg Oral Daily Garrison Kanner, MD   20 mg at 12/10/23 1610   guaiFENesin  (ROBITUSSIN) 100 MG/5ML liquid 300 mg  300 mg Oral QID PRN Cox, Amy N, DO       heparin injection 5,000 Units  5,000 Units Subcutaneous Q8H Cox, Amy N, DO   5,000 Units at 12/10/23 1416   hydrALAZINE (APRESOLINE) injection 5 mg  5 mg Intravenous Q6H PRN Cox, Amy N, DO       LORazepam (ATIVAN) tablet 0.5 mg  0.5 mg Oral Q6H PRN Cox, Amy N, DO       melatonin tablet 5 mg  5 mg Oral QHS PRN Cox, Amy N, DO   5 mg at 12/06/23 2239   midodrine  (PROAMATINE ) tablet 2.5 mg  2.5 mg Oral TID WC Zhang, Dekui, MD   2.5 mg at 12/10/23 1211   ondansetron  (ZOFRAN ) tablet 4 mg  4  mg Oral Q6H PRN Cox, Amy N, DO       Or   ondansetron  (ZOFRAN ) injection 4 mg  4 mg Intravenous Q6H PRN Cox, Amy N, DO       rosuvastatin  (CRESTOR ) tablet 20 mg  20 mg Oral QHS Cox, Amy N, DO   20 mg at 12/09/23 2216   senna-docusate (Senokot-S) tablet 1 tablet  1 tablet Oral QHS PRN Cox, Amy N, DO   1 tablet at 12/09/23 2216   sodium bicarbonate  tablet 650 mg  650 mg Oral BID Zhang, Dekui, MD   650 mg at 12/10/23 9604   timolol  (TIMOPTIC ) 0.5 % ophthalmic solution 1 drop  1 drop Both Eyes BID Garrison Kanner, MD   1 drop at 12/10/23 1000     Discharge Medications: Please see discharge summary for a list of discharge medications.  Relevant Imaging Results:  Relevant Lab Results:   Additional Information SSN:[id number]  Crayton Docker, RN

## 2023-12-10 NOTE — Plan of Care (Signed)
  Problem: Education: Goal: Knowledge of General Education information will improve Description: Including pain rating scale, medication(s)/side effects and non-pharmacologic comfort measures Outcome: Progressing   Problem: Health Behavior/Discharge Planning: Goal: Ability to manage health-related needs will improve Outcome: Progressing   Problem: Clinical Measurements: Goal: Ability to maintain clinical measurements within normal limits will improve Outcome: Progressing Goal: Will remain free from infection Outcome: Progressing Goal: Diagnostic test results will improve Outcome: Progressing Goal: Respiratory complications will improve Outcome: Progressing Goal: Cardiovascular complication will be avoided Outcome: Progressing   Problem: Activity: Goal: Risk for activity intolerance will decrease Outcome: Progressing   Problem: Coping: Goal: Level of anxiety will decrease Outcome: Progressing   Problem: Nutrition: Goal: Adequate nutrition will be maintained Outcome: Progressing   Problem: Elimination: Goal: Will not experience complications related to bowel motility Outcome: Progressing Goal: Will not experience complications related to urinary retention Outcome: Progressing   

## 2023-12-10 NOTE — Progress Notes (Signed)
 Progress Note   Patient: Audrey Duran ZOX:[id number] DOB: 1935-04-20 DOA: 12/06/2023     3 DOS: the patient was seen and examined on 12/10/2023   Brief hospital course: Mrs. Audrey Duran is an 89 year old female with history of paroxysmal atrial fibrillation on aspirin , history of hypertension, dementia, chronic kidney stage IIIb, hyperlipidemia, with history of hypertension, who presents to emergency department for chief concerns of fall at [facility] via EMS. She was found to have acute renal failure, bladder scan showed residual of 962, Foley catheter anchored.  Patient was also found to have profound orthostatic hypotension.  Midodrine  was started. Patient will need nursing home placement.   Principal Problem:   AKI (acute kidney injury) (HCC) Active Problems:   COPD (chronic obstructive pulmonary disease) (HCC)   Orthostatic hypotension   Acute kidney injury superimposed on chronic kidney disease (HCC)   Acute hypoxic respiratory failure (HCC)   Anxiety state   Paroxysmal atrial fibrillation (HCC)   Hyperkalemia   CAD (coronary artery disease), native coronary artery   Essential hypertension   Protein-calorie malnutrition (HCC)   Compression fracture of T11 vertebra (HCC)   (HFpEF) heart failure with preserved ejection fraction (HCC)   CKD stage 3b, GFR 30-44 ml/min (HCC)   Falling   Pressure injury of skin   Assessment and Plan: Fall at home, likely syncope and collapse. Orthostatic hypotension. Patient had a fall at home, she has a profound orthostatic hypotension, she probably had a syncope collapse at home. Midodrine  was started on 5/14, continue to follow orthostatic vital signs.  Cortisol level 11.7, no significant adrenal insufficiency. Blood pressure much better after lower dose midodrine .  Continue to follow.   Acute kidney injury on chronic kidney stage IIIb. Severe urinary retention. Hyperkalemia secondary to urinary tension. Patient received the  fluids, renal functions better.  However, patient had a significant urinary retention with residual 962 mL.  Foley catheter anchored.  UA does not support a urinary tract infection diagnosis. Patient will be followed with urology in 2 weeks after discharge.   Recheck BMP tomorrow.   Hypoxemic resp failure ruled out. COPD. COPD stable, reviewed chart, patient has no documented hypoxemia.  Patient also has no short of breath.   Essential hypertension. All medicines are on hold due to low blood pressure.   Dementia. Continue to follow.   Pressure ulcer POA. Pressure Injury 12/08/23 Sacrum Medial Stage 3 -  Full thickness tissue loss. Subcutaneous fat may be visible but bone, tendon or muscle are NOT exposed. WOC assessment; changed Staging; reviewed 2 nurse skin assessment (POA) (Active)  12/08/23 (Reported from previous shift, present on admission, present prior to arrival of my shift.) 0745  Location: Sacrum  Location Orientation: Medial  Staging: Stage 3 -  Full thickness tissue loss. Subcutaneous fat may be visible but bone, tendon or muscle are NOT exposed.  Wound Description (Comments): WOC assessment; changed Staging; reviewed 2 nurse skin assessment (POA)  Present on Admission: Yes        Subjective:  Doing much better, good appetite.  No shortness of breath.  No nausea vomiting.  Physical Exam: Vitals:   12/09/23 1514 12/09/23 2019 12/10/23 0441 12/10/23 0800  BP: (!) 108/45 (!) 119/43 (!) 101/59 (!) 108/41  Pulse: 67 64 68 61  Resp: 18 18 18 14   Temp: 98.6 F (37 C) 98.4 F (36.9 C) 98.1 F (36.7 C) 98.2 F (36.8 C)  TempSrc:  Oral    SpO2: 94% 92% 91% 90%  Height:  General exam: Appears calm and comfortable  Respiratory system: Clear to auscultation. Respiratory effort normal. Cardiovascular system: S1 & S2 heard, RRR. No JVD, murmurs, rubs, gallops or clicks. No pedal edema. Gastrointestinal system: Abdomen is nondistended, soft and nontender. No  organomegaly or masses felt. Normal bowel sounds heard. Central nervous system: Alert and oriented x2. No focal neurological deficits. Extremities: Symmetric 5 x 5 power. Skin: No rashes, lesions or ulcers Psychiatry: Mood & affect appropriate.    Data Reviewed:  There are no new results to review at this time.  Family Communication: Daughter updated at bedside.  Disposition: Status is: Inpatient Remains inpatient appropriate because: Unsafe discharge pending nursing placement.     Time spent: 35 minutes  Author: Donaciano Frizzle, MD 12/10/2023 1:25 PM  For on call review [email or web address].

## 2023-12-10 NOTE — Progress Notes (Addendum)
 Physical Therapy Treatment Patient Details Name: Audrey Duran MRN: [id number] DOB: [birth year] Today's Date: 12/10/2023   History of Present Illness presented to ER secondary to fall in home environment; admitted for management of AKI    PT Comments  Pt in bed needing max encouragement to participate.  Orthostatic vitals taken and stable today with no dizziness noted.  In flow sheets.  She is able to stand and transfer to recliner with RW and min a x 1.  Pt resistant to mobility and sitting in chair but with encouragement agrees.  Pt remained up for breakfast with nursing in room.  Pt self limiting and could likely have done more if she was willing.   If plan is discharge home, recommend the following: A little help with walking and/or transfers;A little help with bathing/dressing/bathroom;Assistance with cooking/housework;Assist for transportation;Help with stairs or ramp for entrance   Can travel by private vehicle        Equipment Recommendations  Rolling walker (2 wheels)    Recommendations for Other Services       Precautions / Restrictions Precautions Precautions: Fall Restrictions Weight Bearing Restrictions Per Provider Order: No     Mobility  Bed Mobility Overal bed mobility: Needs Assistance Bed Mobility: Supine to Sit     Supine to sit: Min assist     General bed mobility comments: max verbal cues and encouragement Patient Response: Cooperative  Transfers Overall transfer level: Needs assistance Equipment used: Rolling walker (2 wheels) Transfers: Sit to/from Stand Sit to Stand: Min assist                Ambulation/Gait Ambulation/Gait assistance: Min Chemical engineer (Feet): 3 Feet Assistive device: Rolling walker (2 wheels) Gait Pattern/deviations: Step-to pattern Gait velocity: dec     General Gait Details: safely steps to chair but self limits   Stairs             Wheelchair Mobility     Tilt Bed Tilt Bed Patient  Response: Cooperative  Modified Rankin (Stroke Patients Only)       Balance Overall balance assessment: Needs assistance Sitting-balance support: No upper extremity supported, Feet supported Sitting balance-Leahy Scale: Fair     Standing balance support: Bilateral upper extremity supported Standing balance-Leahy Scale: Fair                              Hotel manager: No apparent difficulties  Cognition Arousal: Alert Behavior During Therapy: Flat affect   PT - Cognitive impairments: History of cognitive impairments                                Cueing Cueing Techniques: Verbal cues  Exercises      General Comments        Pertinent Vitals/Pain Pain Assessment Pain Assessment: No/denies pain    Home Living                          Prior Function            PT Goals (current goals can now be found in the care plan section) Progress towards PT goals: Progressing toward goals    Frequency    Min 2X/week      PT Plan      Co-evaluation  AM-PAC PT "6 Clicks" Mobility   Outcome Measure  Help needed turning from your back to your side while in a flat bed without using bedrails?: A Little Help needed moving from lying on your back to sitting on the side of a flat bed without using bedrails?: A Little Help needed moving to and from a bed to a chair (including a wheelchair)?: A Little Help needed standing up from a chair using your arms (e.g., wheelchair or bedside chair)?: A Little Help needed to walk in hospital room?: A Little Help needed climbing 3-5 steps with a railing? : A Lot 6 Click Score: 17    End of Session Equipment Utilized During Treatment: Gait belt Activity Tolerance: Patient tolerated treatment well Patient left: in chair;with call bell/phone within reach;with chair alarm set;with nursing/sitter in room Nurse Communication: Mobility status PT Visit  Diagnosis: Muscle weakness (generalized) (M62.81);Difficulty in walking, not elsewhere classified (R26.2)     Time: 0347-4259 PT Time Calculation (min) (ACUTE ONLY): 18 min  Charges:    $Therapeutic Activity: 8-22 mins PT General Charges $$ ACUTE PT VISIT: 1 Visit                    Charlanne Cong, PTA 12/10/23, 10:46 AM

## 2023-12-10 NOTE — Plan of Care (Signed)

## 2023-12-10 NOTE — Care Management Important Message (Signed)
 Important Message  Patient Details  Name: Audrey Duran MRN: [id number] Date of Birth: 10-06-1934   Important Message Given:  Yes - Medicare IM     Anise Kerns 12/10/2023, 3:29 PM

## 2023-12-10 NOTE — TOC Initial Note (Addendum)
 Transition of Care [facility]) - Initial/Assessment Note    Patient Details  Name: Audrey Duran MRN: [id number] Date of Birth: 07/24/1935  Transition of Care [facility]) CM/SW Contact:    Crayton Docker, RN 12/10/2023, 3:30 PM  Clinical Narrative:                  CM to patient's room regarding TOC screening assessment. Patient[ is sleeping and patient's daughter Audrey Duran is patient's bedside. CM introduced case management role and discharge care planning process. Patient's daughter verbalized understanding and agreement with screening interview. Per patient's daughter, patient has lived at [facility] [place] [place] [facility] for 1 year and uses no DME. CM and patient's daughter discussed SNF recommendations and SNF preferences. Per patient's daughter, Audrey Duran, no SNF preferences. CM will complete SNF work up.  FL2 completed, CM alert to Dr. Jeane Miguel regarding signature. CM review in [place] MUST for PASSR. PASSR  is [id number], approved 02/17/2012. CM faxed SNF referral to [facility] [facility], [facility], [facility], and [facility]. CM will continue to follow for discharge care planning needs.  Expected Discharge Plan: Skilled Nursing Facility Barriers to Discharge: Continued Medical Work up   Patient Goals and CMS Choice    SNF recommended.   Expected Discharge Plan and Services    SNF   Prior Living Arrangements/Services   Lives with:: Other (Comment) ([facility] [place] [facility] [place])          Need for Family Participation in Patient Care: Yes (Comment) Care giver support system in place?: Yes (comment)      Activities of Daily Living  Unable to assess    Permission Sought/Granted    Unable to assess Emotional Assessment Appearance:: Appears stated age Attitude/Demeanor/Rapport: Other (comment) (patient sleeping, unable to assess) Affect (typically observed):  (patient sleeping, unable to assess)         Admission diagnosis:  Hyperkalemia [E87.5] Lightheaded [R42] Hypoxia [R09.02] AKI (acute kidney injury) (HCC) [N17.9] Fall, initial encounter [W19.XXXA] Patient Active Problem List   Diagnosis Date Noted   Pressure injury of skin 12/08/2023   AKI (acute kidney injury) (HCC) 12/06/2023   CKD stage 3b, GFR 30-44 ml/min (HCC) 12/06/2023   Falling 12/06/2023   Acute hypoxic respiratory failure (HCC) 08/25/2023   Acute gastroenteritis 08/24/2023   Bradycardia 08/24/2023   (HFpEF) heart failure with preserved ejection fraction (HCC) 08/24/2023   Cough 08/24/2023   Compression fracture of T11 vertebra (HCC) 03/05/2023   Impaired ambulation 03/05/2023   Dementia with agitation (HCC) 10/13/2022   Altered mental status 10/07/2022   Protein-calorie malnutrition (HCC) 10/07/2022   Decreased appetite 10/07/2022   Acute kidney injury superimposed on chronic kidney disease (HCC) 04/27/2022   Essential hypertension 10/09/2020   Aortic atherosclerosis (HCC) 10/08/2020   Grief at loss of child 04/05/2020   Orthostatic hypotension 03/03/2020   S/P laparoscopic appendectomy 02/27/2020   History of herpes labialis 02/05/2020   Lower extremity edema 02/01/2020   Delusional disorder (HCC) 01/30/2020   Psychosis, 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   Unintentional weight loss 04/16/2019   Episodes of formed visual hallucinations 10/12/2018   Abnormal mammogram of right breast 04/08/2018   Impaired fasting glucose 08/29/2017   Prediabetes 05/21/2016   History of vertigo 11/16/2015   Hospital discharge follow-up 04/19/2015   History of Clostridium difficile colitis 12/14/2014   Long-term use of high-risk medication 05/08/2014   Cardiomyopathy (HCC) 02/15/2014   CHF (congestive heart failure) (HCC) 02/15/2014   H/O  carotid endarterectomy 02/15/2014   S/P TAH-BSO (total abdominal hysterectomy and bilateral salpingo-oophorectomy) 10/26/2013   Vitamin D  deficiency 10/12/2013   CAD (coronary artery  disease), native coronary artery 11/18/2012   Eosinophilia 09/21/2012   Hyperkalemia 09/21/2012   Anxiety state 09/20/2012   Osteopenia of the elderly 06/20/2012   At high risk for falls 06/20/2012   Encounter for preventive health examination 04/25/2012   Atrophic vaginitis 01/17/2012   Paroxysmal atrial fibrillation (HCC)    Recurrent HSV (herpes simplex virus)    COPD (chronic obstructive pulmonary disease) (HCC)    Ischemic cardiomyopathy    Cystocele or rectocele with complete uterine prolapse 08/02/2011   Screening for breast cancer 07/31/2011   Screening for cervical cancer 07/31/2011   Screening for colon cancer 07/31/2011   Coronary artery disease    History of myocardial infarction    Hyperlipidemia LDL goal <70    PCP:  Thersia Flax, MD Pharmacy:   [facility] - [place], [place] - [address] [place] [address] Phone: [phone] Fax: [phone]     Social Drivers of Health (SDOH) Social History: SDOH Screenings   Food Insecurity: No Food Insecurity (12/06/2023)  Housing: Low Risk  (12/06/2023)  Transportation Needs: No Transportation Needs (12/06/2023)  Utilities: Not At Risk (12/06/2023)  Depression (PHQ2-9): Low Risk  (10/02/2022)  Financial Resource Strain: Low Risk  (12/03/2020)  Social Connections: Moderately Isolated (12/06/2023)  Stress: No Stress Concern Present (12/03/2020)  Tobacco Use: Medium Risk (12/06/2023)   SDOH Interventions:     Readmission Risk Interventions     No data to display

## 2023-12-11 DIAGNOSIS — N189 Chronic kidney disease, unspecified: Secondary | ICD-10-CM | POA: Diagnosis not present

## 2023-12-11 DIAGNOSIS — I951 Orthostatic hypotension: Secondary | ICD-10-CM | POA: Diagnosis not present

## 2023-12-11 DIAGNOSIS — I480 Paroxysmal atrial fibrillation: Secondary | ICD-10-CM | POA: Diagnosis not present

## 2023-12-11 DIAGNOSIS — N179 Acute kidney failure, unspecified: Secondary | ICD-10-CM | POA: Diagnosis not present

## 2023-12-11 LAB — BASIC METABOLIC PANEL WITH GFR
Anion gap: 6 (ref 5–15)
BUN: 46 mg/dL — ABNORMAL HIGH (ref 8–23)
CO2: 20 mmol/L — ABNORMAL LOW (ref 22–32)
Calcium: 8.3 mg/dL — ABNORMAL LOW (ref 8.9–10.3)
Chloride: 109 mmol/L (ref 98–111)
Creatinine, Ser: 1.52 mg/dL — ABNORMAL HIGH (ref 0.44–1.00)
GFR, Estimated: 33 mL/min — ABNORMAL LOW (ref >60)
Glucose, Bld: 102 mg/dL — ABNORMAL HIGH (ref 70–99)
Potassium: 3.7 mmol/L (ref 3.5–5.1)
Sodium: 135 mmol/L (ref 135–145)

## 2023-12-11 LAB — CBC
HCT: 26.8 % — ABNORMAL LOW (ref 36.0–46.0)
Hemoglobin: 8.5 g/dL — ABNORMAL LOW (ref 12.0–15.0)
MCH: 29 pg (ref 26.0–34.0)
MCHC: 31.7 g/dL (ref 30.0–36.0)
MCV: 91.5 fL (ref 80.0–100.0)
Platelets: 158 10*3/uL (ref 150–400)
RBC: 2.93 MIL/uL — ABNORMAL LOW (ref 3.87–5.11)
RDW: 13.7 % (ref 11.5–15.5)
WBC: 9.5 10*3/uL (ref 4.0–10.5)
nRBC: 0 % (ref 0.0–0.2)

## 2023-12-11 LAB — MAGNESIUM: Magnesium: 1.7 mg/dL (ref 1.7–2.4)

## 2023-12-11 MED ORDER — MIDODRINE HCL 5 MG PO TABS
2.5000 mg | ORAL_TABLET | Freq: Two times a day (BID) | ORAL | Status: DC
  Administered 2023-12-11 – 2023-12-14 (×6): 2.5 mg via ORAL
  Filled 2023-12-11 (×6): qty 1

## 2023-12-11 NOTE — Progress Notes (Signed)
 Progress Note   Patient: [name]:[id number] DOB: Nov 22, 1934 DOA: 12/06/2023     4 DOS: the patient was seen and examined on 12/11/2023   Brief hospital course: Mrs. Audrey Duran is an 89 year old female with history of paroxysmal atrial fibrillation on aspirin , history of hypertension, dementia, chronic kidney stage IIIb, hyperlipidemia, with history of hypertension, who presents to emergency department for chief concerns of fall at [facility] via EMS. She was found to have acute renal failure, bladder scan showed residual of 962, Foley catheter anchored.  Patient was also found to have profound orthostatic hypotension.  Midodrine  was started. Patient will need nursing home placement.   Principal Problem:   AKI (acute kidney injury) (HCC) Active Problems:   COPD (chronic obstructive pulmonary disease) (HCC)   Orthostatic hypotension   Acute kidney injury superimposed on chronic kidney disease (HCC)   Acute hypoxic respiratory failure (HCC)   Anxiety state   Paroxysmal atrial fibrillation (HCC)   Hyperkalemia   CAD (coronary artery disease), native coronary artery   Essential hypertension   Protein-calorie malnutrition (HCC)   Compression fracture of T11 vertebra (HCC)   (HFpEF) heart failure with preserved ejection fraction (HCC)   CKD stage 3b, GFR 30-44 ml/min (HCC)   Falling   Pressure injury of skin   Assessment and Plan: Fall at home, likely syncope and collapse. Orthostatic hypotension. Patient had a fall at home, she has a profound orthostatic hypotension, she probably had a syncope collapse at home. Midodrine  was started on 5/14, continue to follow orthostatic vital signs.  Cortisol level 11.7, no significant adrenal insufficiency. Orthostatic vital signs improved after midodrine .  Continue midodrine , decrease dose to twice a day.   Acute kidney injury on chronic kidney stage IIIb. Severe urinary retention. Hyperkalemia secondary to urinary  tension. Minimal metabolic acidosis. Patient received the fluids, renal functions better.  However, patient had a significant urinary retention with residual 962 mL.  Foley catheter anchored.  UA does not support a urinary tract infection diagnosis. Patient will be followed with urology in 2 weeks after discharge.   Renal function continued to improve, has mild metabolic acidosis.  Continue sodium bicarb.   Hypoxemic resp failure ruled out. COPD. COPD stable, reviewed chart, patient has no documented hypoxemia.  Patient also has no short of breath.   Essential hypertension. All medicines are on hold due to low blood pressure.   Dementia. Continue to follow.   Pressure ulcer POA. Pressure Injury 12/08/23 Sacrum Medial Stage 3 -  Full thickness tissue loss. Subcutaneous fat may be visible but bone, tendon or muscle are NOT exposed. WOC assessment; changed Staging; reviewed 2 nurse skin assessment (POA) (Active)  12/08/23 (Reported from previous shift, present on admission, present prior to arrival of my shift.) 0745  Location: Sacrum  Location Orientation: Medial  Staging: Stage 3 -  Full thickness tissue loss. Subcutaneous fat may be visible but bone, tendon or muscle are NOT exposed.  Wound Description (Comments): WOC assessment; changed Staging; reviewed 2 nurse skin assessment (POA)  Present on Admission: Yes           Subjective:  Doing well, good appetite without nausea vomiting. No shortness of breath.  Physical Exam: Vitals:   12/10/23 0800 12/10/23 1614 12/11/23 0510 12/11/23 0813  BP: (!) 108/41 (!) 99/34 113/61 (!) 109/41  Pulse: 61 (!) 55 (!) 55 63  Resp: 14 15 16    Temp: 98.2 F (36.8 C) 98 F (36.7 C) 98.1 F (36.7 C) 98.1  F (36.7 C)  TempSrc:      SpO2: 90% 91% 90% 91%  Height:       General exam: Appears calm and comfortable  Respiratory system: Clear to auscultation. Respiratory effort normal. Cardiovascular system: S1 & S2 heard, RRR. No JVD,  murmurs, rubs, gallops or clicks. No pedal edema. Gastrointestinal system: Abdomen is nondistended, soft and nontender. No organomegaly or masses felt. Normal bowel sounds heard. Central nervous system: Alert and oriented x2. No focal neurological deficits. Extremities: Symmetric 5 x 5 power. Skin: No rashes, lesions or ulcers Psychiatry: Judgement and insight appear normal. Mood & affect appropriate.    Data Reviewed:  Lab results reviewed.  Family Communication: None  Disposition: Status is: Inpatient Remains inpatient appropriate because: Unsafe discharge, pending nursing home placement.     Time spent: 35 minutes  Author: Donaciano Frizzle, MD 12/11/2023 12:02 PM  For on call review [email or web address].

## 2023-12-12 DIAGNOSIS — I10 Essential (primary) hypertension: Secondary | ICD-10-CM | POA: Diagnosis not present

## 2023-12-12 DIAGNOSIS — N189 Chronic kidney disease, unspecified: Secondary | ICD-10-CM | POA: Diagnosis not present

## 2023-12-12 DIAGNOSIS — I951 Orthostatic hypotension: Secondary | ICD-10-CM | POA: Diagnosis not present

## 2023-12-12 DIAGNOSIS — N179 Acute kidney failure, unspecified: Secondary | ICD-10-CM | POA: Diagnosis not present

## 2023-12-12 LAB — GLUCOSE, CAPILLARY
Glucose-Capillary: 82 mg/dL (ref 70–99)
Glucose-Capillary: 132 mg/dL — ABNORMAL HIGH (ref 70–99)
Glucose-Capillary: 134 mg/dL — ABNORMAL HIGH (ref 70–99)

## 2023-12-12 NOTE — Plan of Care (Signed)

## 2023-12-12 NOTE — Progress Notes (Signed)
 Progress Note   Patient: Audrey Duran VHQ:[id number] DOB: [birth year] DOA: 12/06/2023     5 DOS: the patient was seen and examined on 12/12/2023   Brief hospital course: Mrs. Audrey Duran is an 89 year old female with history of paroxysmal atrial fibrillation on aspirin , history of hypertension, dementia, chronic kidney stage IIIb, hyperlipidemia, with history of hypertension, who presents to emergency department for chief concerns of fall at [facility] via EMS. She was found to have acute renal failure, bladder scan showed residual of 962, Foley catheter anchored.  Patient was also found to have profound orthostatic hypotension.  Midodrine  was started. Patient will need nursing home placement.   Principal Problem:   AKI (acute kidney injury) (HCC) Active Problems:   COPD (chronic obstructive pulmonary disease) (HCC)   Orthostatic hypotension   Acute kidney injury superimposed on chronic kidney disease (HCC)   Acute hypoxic respiratory failure (HCC)   Anxiety state   Paroxysmal atrial fibrillation (HCC)   Hyperkalemia   CAD (coronary artery disease), native coronary artery   Essential hypertension   Protein-calorie malnutrition (HCC)   Compression fracture of T11 vertebra (HCC)   (HFpEF) heart failure with preserved ejection fraction (HCC)   CKD stage 3b, GFR 30-44 ml/min (HCC)   Falling   Pressure injury of skin   Assessment and Plan: Fall at home, likely syncope and collapse. Orthostatic hypotension. Patient had a fall at home, she has a profound orthostatic hypotension, she probably had a syncope collapse at home. Midodrine  was started on 5/14, continue to follow orthostatic vital signs.  Cortisol level 11.7, no significant adrenal insufficiency. Orthostatic vital signs improved after midodrine .  Continue midodrine , decrease dose to twice a day. Condition has improved.   Acute kidney injury on chronic kidney stage IIIb. Severe urinary retention. Hyperkalemia  secondary to urinary tension. Minimal metabolic acidosis. Patient received the fluids, renal functions better.  However, patient had a significant urinary retention with residual 962 mL.  Foley catheter anchored.  UA does not support a urinary tract infection diagnosis. Patient will be followed with urology in 2 weeks after discharge.   Renal function continued to improve, has mild metabolic acidosis.  Continued sodium bicarb.  Recheck BMP tomorrow.  Anemia. Check iron B12 level tomorrow.   Hypoxemic resp failure ruled out. COPD. COPD stable, reviewed chart, patient has no documented hypoxemia.  Patient also has no short of breath.   Essential hypertension. All medicines are on hold due to low blood pressure.   Dementia. Continue to follow.   Pressure ulcer POA. Pressure Injury 12/08/23 Sacrum Medial Stage 3 -  Full thickness tissue loss. Subcutaneous fat may be visible but bone, tendon or muscle are NOT exposed. WOC assessment; changed Staging; reviewed 2 nurse skin assessment (POA) (Active)  12/08/23 (Reported from previous shift, present on admission, present prior to arrival of my shift.) 0745  Location: Sacrum  Location Orientation: Medial  Staging: Stage 3 -  Full thickness tissue loss. Subcutaneous fat may be visible but bone, tendon or muscle are NOT exposed.  Wound Description (Comments): WOC assessment; changed Staging; reviewed 2 nurse skin assessment (POA)  Present on Admission: Yes         Subjective:  Patient doing well today, good appetite without nausea vomiting.  Had a bowel movement.  Physical Exam: Vitals:   12/11/23 1726 12/11/23 2131 12/12/23 0501 12/12/23 0744  BP: (!) 123/45 (!) 125/56 (!) 116/50 (!) 109/42  Pulse: 61 (!) 56 (!) 59 63  Resp: 16  Temp: 98.7 F (37.1 C) 97.8 F (36.6 C) 98.1 F (36.7 C) (!) 97.4 F (36.3 C)  TempSrc: Oral     SpO2: 93% 94% 90% 90%  Height:       General exam: Appears calm and comfortable  Respiratory system:  Clear to auscultation. Respiratory effort normal. Cardiovascular system: S1 & S2 heard, RRR. No JVD, murmurs, rubs, gallops or clicks. No pedal edema. Gastrointestinal system: Abdomen is nondistended, soft and nontender. No organomegaly or masses felt. Normal bowel sounds heard. Central nervous system: Alert and oriented. No focal neurological deficits. Extremities: Symmetric 5 x 5 power. Skin: No rashes, lesions or ulcers Psychiatry: Judgement and insight appear normal. Mood & affect appropriate.    Data Reviewed:  There are no new results to review at this time.  Family Communication: None  Disposition: Status is: Inpatient Remains inpatient appropriate because: Unsafe discharge, pending nursing home placement.     Time spent: 35 minutes  Author: Donaciano Frizzle, MD 12/12/2023 1:52 PM  For on call review [email or web address].

## 2023-12-12 NOTE — Plan of Care (Signed)

## 2023-12-13 ENCOUNTER — Other Ambulatory Visit: Payer: Self-pay

## 2023-12-13 DIAGNOSIS — N179 Acute kidney failure, unspecified: Secondary | ICD-10-CM | POA: Diagnosis not present

## 2023-12-13 DIAGNOSIS — I70223 Atherosclerosis of native arteries of extremities with rest pain, bilateral legs: Secondary | ICD-10-CM | POA: Diagnosis not present

## 2023-12-13 DIAGNOSIS — J449 Chronic obstructive pulmonary disease, unspecified: Secondary | ICD-10-CM | POA: Diagnosis not present

## 2023-12-13 DIAGNOSIS — I10 Essential (primary) hypertension: Secondary | ICD-10-CM | POA: Diagnosis not present

## 2023-12-13 DIAGNOSIS — E119 Type 2 diabetes mellitus without complications: Secondary | ICD-10-CM | POA: Diagnosis not present

## 2023-12-13 DIAGNOSIS — E038 Other specified hypothyroidism: Secondary | ICD-10-CM | POA: Diagnosis not present

## 2023-12-13 DIAGNOSIS — I951 Orthostatic hypotension: Secondary | ICD-10-CM | POA: Diagnosis not present

## 2023-12-13 DIAGNOSIS — E782 Mixed hyperlipidemia: Secondary | ICD-10-CM | POA: Diagnosis not present

## 2023-12-13 DIAGNOSIS — E559 Vitamin D deficiency, unspecified: Secondary | ICD-10-CM | POA: Diagnosis not present

## 2023-12-13 DIAGNOSIS — N189 Chronic kidney disease, unspecified: Secondary | ICD-10-CM | POA: Diagnosis not present

## 2023-12-13 DIAGNOSIS — D519 Vitamin B12 deficiency anemia, unspecified: Secondary | ICD-10-CM | POA: Diagnosis not present

## 2023-12-13 LAB — BASIC METABOLIC PANEL WITH GFR
Anion gap: 8 (ref 5–15)
BUN: 34 mg/dL — ABNORMAL HIGH (ref 8–23)
CO2: 23 mmol/L (ref 22–32)
Calcium: 8.8 mg/dL — ABNORMAL LOW (ref 8.9–10.3)
Chloride: 105 mmol/L (ref 98–111)
Creatinine, Ser: 1.47 mg/dL — ABNORMAL HIGH (ref 0.44–1.00)
GFR, Estimated: 35 mL/min — ABNORMAL LOW (ref >60)
Glucose, Bld: 138 mg/dL — ABNORMAL HIGH (ref 70–99)
Potassium: 3.7 mmol/L (ref 3.5–5.1)
Sodium: 136 mmol/L (ref 135–145)

## 2023-12-13 LAB — CBC
HCT: 28.5 % — ABNORMAL LOW (ref 36.0–46.0)
Hemoglobin: 9 g/dL — ABNORMAL LOW (ref 12.0–15.0)
MCH: 29.4 pg (ref 26.0–34.0)
MCHC: 31.6 g/dL (ref 30.0–36.0)
MCV: 93.1 fL (ref 80.0–100.0)
Platelets: 176 10*3/uL (ref 150–400)
RBC: 3.06 MIL/uL — ABNORMAL LOW (ref 3.87–5.11)
RDW: 13.8 % (ref 11.5–15.5)
WBC: 12.6 10*3/uL — ABNORMAL HIGH (ref 4.0–10.5)
nRBC: 0 % (ref 0.0–0.2)

## 2023-12-13 LAB — FERRITIN: Ferritin: 932 ng/mL — ABNORMAL HIGH (ref 11–307)

## 2023-12-13 LAB — IRON AND TIBC
Iron: 38 ug/dL (ref 28–170)
Saturation Ratios: 18 % (ref 10.4–31.8)
TIBC: 207 ug/dL — ABNORMAL LOW (ref 250–450)
UIBC: 169 ug/dL

## 2023-12-13 LAB — VITAMIN B12: Vitamin B-12: 410 pg/mL (ref 180–914)

## 2023-12-13 MED ORDER — MIDODRINE HCL 2.5 MG PO TABS
2.5000 mg | ORAL_TABLET | Freq: Two times a day (BID) | ORAL | 0 refills | Status: DC
  Filled 2023-12-13: qty 60, 30d supply, fill #0

## 2023-12-13 MED ORDER — VITAMIN C 500 MG PO TABS
500.0000 mg | ORAL_TABLET | Freq: Two times a day (BID) | ORAL | Status: DC
  Administered 2023-12-13 – 2023-12-14 (×3): 500 mg via ORAL
  Filled 2023-12-13 (×3): qty 1

## 2023-12-13 MED ORDER — ENSURE ENLIVE PO LIQD
237.0000 mL | Freq: Two times a day (BID) | ORAL | Status: DC
  Administered 2023-12-13 – 2023-12-14 (×2): 237 mL via ORAL

## 2023-12-13 MED ORDER — ADULT MULTIVITAMIN W/MINERALS CH
1.0000 | ORAL_TABLET | Freq: Every day | ORAL | Status: DC
  Administered 2023-12-13 – 2023-12-14 (×2): 1 via ORAL
  Filled 2023-12-13 (×2): qty 1

## 2023-12-13 MED ORDER — ZINC SULFATE 220 (50 ZN) MG PO CAPS
220.0000 mg | ORAL_CAPSULE | Freq: Every day | ORAL | Status: DC
  Administered 2023-12-13 – 2023-12-14 (×2): 220 mg via ORAL
  Filled 2023-12-13 (×2): qty 1

## 2023-12-13 NOTE — Plan of Care (Signed)
   Problem: Education: Goal: Knowledge of General Education information will improve Description Including pain rating scale, medication(s)/side effects and non-pharmacologic comfort measures Outcome: Progressing   Problem: Clinical Measurements: Goal: Ability to maintain clinical measurements within normal limits will improve Outcome: Progressing   Problem: Activity: Goal: Risk for activity intolerance will decrease Outcome: Progressing

## 2023-12-13 NOTE — Plan of Care (Signed)
  Problem: Health Behavior/Discharge Planning: Goal: Ability to manage health-related needs will improve Outcome: Progressing   Problem: Clinical Measurements: Goal: Respiratory complications will improve Outcome: Progressing   Problem: Coping: Goal: Level of anxiety will decrease Outcome: Progressing   Problem: Safety: Goal: Ability to remain free from injury will improve Outcome: Progressing

## 2023-12-13 NOTE — Progress Notes (Signed)
 [facility] Liaison Note  12/13/2023  MATISHA TERMINE 23-Aug-1934 [id number]  Location: RN Hospital Liaison screened the patient remotely at [facility].  Insurance: [employer]   LAFREDA CASEBEER is a 89 y.o. female who is a Primary Care Patient of Madelon Scheuermann, Ray Caffey, MD [facility]. The patient was screened for  readmission hospitalization with noted extreme risk score for unplanned readmission risk with 2 IP in 6 months.  The patient was assessed for potential Care Management service needs for post hospital transition for care coordination. Review of patient's electronic medical record reveals patient was admitted for Periprosthetic supracondylar. Pt recommended  for memory care ([facility] memory care. Pt will have [facility] PT/OT for [facility] with a discharge today.  This facility will continue to address pt's ongoing needs.  No VBCI services extended.    VBCI Care Management/Population Health does not replace or interfere with any arrangements made by the Inpatient Transition of Care team.   For questions contact:   Lilla Reichert, RN, BSN Hospital Liaison [place]   [facility], Population Health Office Hours MTWF  8:00 am-6:00 pm Direct Dial: [phone] mobile [email or web address]

## 2023-12-13 NOTE — TOC Progression Note (Addendum)
 Transition of Care [facility]) - Progression Note    Patient Details  Name: Audrey Duran MRN: [id number] Date of Birth: 25-Oct-1934  Transition [facility] Care [facility]) CM/SW Contact  Crayton Docker, RN 12/13/2023, 10:56 AM  Clinical Narrative:     Alert received from Dr. Jeane Miguel, patient can discharge back to [facility] [place] [facility] [place] Care Unit with home health PT/OT. CM call to patient's daughter, [name], phone: [phone] regarding patient returning to [facility] [place] with home health. Per patient's daughter, verbalized understanding and agreement. CM call to [facility] [place], phone: [phone]. CM spoke to [facility] regarding patient return and home health agency recommendations. Per Beauford Bounds, facility collaborates with [facility] and [facility]. CM call to Louanna Rouse [facility], phone: [phone]. Per Louanna Rouse, agency has accepted patient and will begin to follow patient for home health services.   CM call to [place], [employer] regarding authorization to return to [facility] [place] [facility] [place] and EMS. No answer, CM left message regarding authorization request.   CM follow up in [place] MUST regarding PASSR, PASSR is [id number], approved on 02/17/2012.  CM returned call to Tammy, [employer] per voicemail message. Per voicemail message, confirmed receipt of request for authorization for facility placement and EMS transport. Per voicemail message, will call back when a decision has been made.  CM follow up call to [place], [employer], phone: [phone] regarding auth pending for facility placement and EMS transport. No answer, CM left message for return call. CM call to patient's daughter, [name], phone: [phone] regarding pending auth status for facility placement and EMS transport. Patient's daughter verbalized understanding and agreement.   Expected Discharge Plan: Skilled Nursing Facility Barriers to Discharge: No Barriers  Identified  Expected Discharge Plan and Services    Return to ALF, Memory Care Unit     Expected Discharge Date: 12/13/23               DME Arranged: Otho Blitz hemi DME Agency: [facility] Date DME Agency Contacted: 12/13/23 Time DME Agency Contacted: 1050 Representative spoke with at DME Agency: Sam Creighton HH Arranged: PT, OT [facility] Agency: [facility] Date [facility] Agency Contacted: 12/13/23 Time HH Agency Contacted: 1051 Representative spoke with at [facility] Agency: Louanna Rouse   Social Determinants of Health (SDOH) Interventions SDOH Screenings   Food Insecurity: No Food Insecurity (12/06/2023)  Housing: Low Risk  (12/06/2023)  Transportation Needs: No Transportation Needs (12/06/2023)  Utilities: Not At Risk (12/06/2023)  Depression (PHQ2-9): Low Risk  (10/02/2022)  Financial Resource Strain: Low Risk  (12/03/2020)  Social Connections: Moderately Isolated (12/06/2023)  Stress: No Stress Concern Present (12/03/2020)  Tobacco Use: Medium Risk (12/06/2023)    Readmission Risk Interventions     No data to display

## 2023-12-13 NOTE — Progress Notes (Signed)
 Physical Therapy Treatment Patient Details Name: Audrey Duran MRN: [id number] DOB: 08/29/1934 Today's Date: 12/13/2023   History of Present Illness presented to ER secondary to fall in home environment; admitted for management of AKI    PT Comments  Pt received supine in bed agreeable to PT. Pt denies dizziness throughout mobility. Deferred orthostatics this date as last PT session pt with negative readings. Pt still needing physical assist and multimodal cuing for bed mobility and especially with safe STS and stand to sit transfer due to poor safety awareness and body mechanics. Pt is reliant on blocking L foot and mod multimodal cuing for STS to RW but is able to progress gait to 20' using RW and CGA. Kyphotic with close to shuffling gait but pt endorsing this is her baseline gait however per evaluation, daughter told PT pt does not use RW for baseline mobility. Pt attempts to sit in recliner far from being close enough to safely sit and without reaching backwards for arm rest needing max multi modal cuing and education to prevent fall to floor with good carryover post education and cuing. Pt progressing in mobility and without dizziness however appears to still be not at baseline. Continue to rec D/c recs based off of her weakness and safety awareness deficits. Pt left with all needs in reach in recliner.    If plan is discharge home, recommend the following: A little help with walking and/or transfers;A little help with bathing/dressing/bathroom;Assistance with cooking/housework;Assist for transportation;Help with stairs or ramp for entrance   Can travel by private vehicle     No  Equipment Recommendations  Rolling walker (2 wheels)    Recommendations for Other Services       Precautions / Restrictions Precautions Precautions: Fall Restrictions Weight Bearing Restrictions Per Provider Order: No     Mobility  Bed Mobility Overal bed mobility: Needs Assistance Bed Mobility:  Supine to Sit     Supine to sit: Min assist     General bed mobility comments: multimodal cuing. Heavy UE reliance on bed rails and minA on torso to attain sitting. Cumbersome for patient. Patient Response: Cooperative, Flat affect  Transfers Overall transfer level: Needs assistance Equipment used: Rolling walker (2 wheels) Transfers: Sit to/from Stand Sit to Stand: Min assist           General transfer comment: cuing for hand placement. Reliant on blocking L feet to prevent anterior sliding leading to posterior lean    Ambulation/Gait Ambulation/Gait assistance: Contact guard assist Gait Distance (Feet): 20 Feet Assistive device: Rolling walker (2 wheels) Gait Pattern/deviations: Step-to pattern, Narrow base of support, Trunk flexed, Shuffle       General Gait Details: able to progress ambulation around bed to recliner in room. Very slow, close to shuffling gait but good use of RW. Reliant on safe stand to sit to prevent falls.   Stairs             Wheelchair Mobility     Tilt Bed Tilt Bed Patient Response: Cooperative, Flat affect  Modified Rankin (Stroke Patients Only)       Balance Overall balance assessment: Needs assistance Sitting-balance support: Feet supported, Bilateral upper extremity supported Sitting balance-Leahy Scale: Fair     Standing balance support: Bilateral upper extremity supported Standing balance-Leahy Scale: Fair Standing balance comment: Initial posterior bias but this improves once upright  Communication Communication Communication: No apparent difficulties  Cognition Arousal: Alert Behavior During Therapy: Flat affect   PT - Cognitive impairments: History of cognitive impairments                       PT - Cognition Comments: good ability to follow commands today. More participatory. Following commands: Intact      Cueing Cueing Techniques: Verbal cues  Exercises       General Comments        Pertinent Vitals/Pain Pain Assessment Pain Assessment: No/denies pain    Home Living                          Prior Function            PT Goals (current goals can now be found in the care plan section) Acute Rehab PT Goals Patient Stated Goal: to return home Time For Goal Achievement: 12/22/23 Potential to Achieve Goals: Good Progress towards PT goals: Progressing toward goals    Frequency    Min 2X/week      PT Plan      Co-evaluation              AM-PAC PT "6 Clicks" Mobility   Outcome Measure  Help needed turning from your back to your side while in a flat bed without using bedrails?: A Little Help needed moving from lying on your back to sitting on the side of a flat bed without using bedrails?: A Lot Help needed moving to and from a bed to a chair (including a wheelchair)?: A Little Help needed standing up from a chair using your arms (e.g., wheelchair or bedside chair)?: A Little Help needed to walk in hospital room?: A Little Help needed climbing 3-5 steps with a railing? : A Little 6 Click Score: 17    End of Session Equipment Utilized During Treatment: Gait belt Activity Tolerance: Patient tolerated treatment well Patient left: in chair;with call bell/phone within reach;with chair alarm set Nurse Communication: Mobility status PT Visit Diagnosis: Muscle weakness (generalized) (M62.81);Difficulty in walking, not elsewhere classified (R26.2)     Time: 1324-4010 PT Time Calculation (min) (ACUTE ONLY): 14 min  Charges:    $Therapeutic Activity: 8-22 mins PT General Charges $$ ACUTE PT VISIT: 1 Visit                     Marc Senior. Fairly IV, PT, DPT Physical Therapist- [place]  [facility]  12/13/2023, 10:19 AM

## 2023-12-13 NOTE — Discharge Summary (Signed)
 Physician Discharge Summary   Patient: Audrey Duran MRN: [id number] DOB: Mar 12, 1935  Admit date:     12/06/2023  Discharge date: 12/13/23  Discharge Physician: Donaciano Frizzle   PCP: Thersia Flax, MD   Recommendations at discharge:   Follow-up with PCP in 1 week. Follow-up with urology in 2 weeks.   Discharge Diagnoses: Principal Problem:   AKI (acute kidney injury) (HCC) Active Problems:   COPD (chronic obstructive pulmonary disease) (HCC)   Orthostatic hypotension   Acute kidney injury superimposed on chronic kidney disease (HCC)   Acute hypoxic respiratory failure (HCC)   Anxiety state   Paroxysmal atrial fibrillation (HCC)   Hyperkalemia   CAD (coronary artery disease), native coronary artery   Essential hypertension   Protein-calorie malnutrition (HCC)   Compression fracture of T11 vertebra (HCC)   (HFpEF) heart failure with preserved ejection fraction (HCC)   CKD stage 3b, GFR 30-44 ml/min (HCC)   Falling   Pressure injury of skin  Resolved Problems:   * No resolved hospital problems. [facility] Course: Audrey Duran is an 89 year old female with history of paroxysmal atrial fibrillation on aspirin , history of hypertension, dementia, chronic kidney stage IIIb, hyperlipidemia, with history of hypertension, who presents to emergency department for chief concerns of fall at [facility] via EMS. She was found to have acute renal failure, bladder scan showed residual of 962, Foley catheter anchored.  Patient was also found to have profound orthostatic hypotension.  Midodrine  was started. Patient will need nursing home placement. Since then, patient condition had improved, did well with physical therapy.  Family no longer wants nursing home, will discharge to assisted living facility.  Assessment and Plan: Fall at home, likely syncope and collapse. Orthostatic hypotension. Patient had a fall at home, she has a profound orthostatic hypotension, she probably  had a syncope collapse at home. Midodrine  was started on 5/14, continue to follow orthostatic vital signs.  Cortisol level 11.7, no significant adrenal insufficiency. Orthostatic vital signs improved after midodrine .  Continue midodrine , decrease dose to twice a day. Condition has improved.   Acute kidney injury on chronic kidney stage IIIb. Severe urinary retention. Hyperkalemia secondary to urinary tension. Minimal metabolic acidosis. Patient received the fluids, renal functions better.  However, patient had a significant urinary retention with residual 962 mL.  Foley catheter anchored.  UA does not support a urinary tract infection diagnosis. Patient will be followed with urology in 2 weeks after discharge.   Renal function continued to improve, has mild metabolic acidosis, which had improved. Follow-up with urology in 2 weeks.   Anemia. Check iron B12 level tomorrow.   Hypoxemic resp failure ruled out. COPD. COPD stable, reviewed chart, patient has no documented hypoxemia.  Patient also has no short of breath.   Essential hypertension. All medicines are on hold due to low blood pressure.   Dementia. Continue to follow.   Pressure ulcer POA.  Wound care order placed, follow-up with PCP as outpatient. Pressure Injury 12/08/23 Sacrum Medial Stage 3 -  Full thickness tissue loss. Subcutaneous fat may be visible but bone, tendon or muscle are NOT exposed. WOC assessment; changed Staging; reviewed 2 nurse skin assessment (POA) (Active)  12/08/23 (Reported from previous shift, present on admission, present prior to arrival of my shift.) 0745  Location: Sacrum  Location Orientation: Medial  Staging: Stage 3 -  Full thickness tissue loss. Subcutaneous fat may be visible but bone, tendon or muscle are NOT exposed.  Wound Description (Comments): WOC  assessment; changed Staging; reviewed 2 nurse skin assessment (POA)  Present on Admission: Yes          Consultants: None Procedures  performed: None  Disposition: ALF Diet recommendation:  Discharge Diet Orders (From admission, onward)     Start     Ordered   12/13/23 0000  Diet - low sodium heart healthy        12/13/23 1029            DISCHARGE MEDICATION: Allergies as of 12/13/2023       Reactions   Azithromycin    GI upset   Clarithromycin    ? Rash   Diclofenac    ? Rash   Lisinopril  Other (See Comments)   hyperkalemia   Meloxicam    ? Rash   Nitrofurantoin Monohyd Macro    ? rash   Zocor [simvastatin]    Penicillins Rash        Medication List     STOP taking these medications    acyclovir  400 MG tablet Commonly known as: ZOVIRAX    furosemide  20 MG tablet Commonly known as: LASIX    lisinopril  5 MG tablet Commonly known as: ZESTRIL        TAKE these medications    acetaminophen  325 MG tablet Commonly known as: TYLENOL  Take by mouth every 4 (four) hours as needed.   alendronate  70 MG tablet Commonly known as: FOSAMAX  Take 1 tablet by mouth every 7 days on an empty stomach with a full glass of water   alum & mag hydroxide-simeth 200-200-20 MG/5ML suspension Commonly known as: MAALOX/MYLANTA Take 30 mLs by mouth every 6 (six) hours as needed for indigestion or heartburn.   aspirin  81 MG tablet Take 81 mg by mouth at bedtime.   citalopram  20 MG tablet Commonly known as: CELEXA  Take 1 tablet (20 mg total) by mouth daily.   guaifenesin  100 MG/5ML syrup Commonly known as: ROBITUSSIN Take 300 mg by mouth 4 (four) times daily as needed for cough.   Iron 325 (65 Fe) MG Tabs Take 1 tablet by mouth daily.   lidocaine  5 % ointment Commonly known as: XYLOCAINE  Apply 1 Application topically as needed.   loperamide 2 MG capsule Commonly known as: IMODIUM Take 4 mg by mouth as needed for diarrhea or loose stools.   LORazepam  0.5 MG tablet Commonly known as: ATIVAN  Take 0.5 mg by mouth every 6 (six) hours as needed for anxiety (NOT TO EXCEED TWO DOSES IN 24 HOURS).    magnesium  hydroxide 400 MG/5ML suspension Commonly known as: MILK OF MAGNESIA Take 30 mLs by mouth daily as needed for mild constipation.   magnesium  oxide 400 MG tablet Commonly known as: MAG-OX Take 400 mg by mouth daily.   midodrine  2.5 MG tablet Commonly known as: PROAMATINE  Take 1 tablet (2.5 mg total) by mouth 2 (two) times daily with a meal.   mirtazapine  7.5 MG tablet Commonly known as: REMERON  Take 1 tablet (7.5 mg total) by mouth at bedtime.   risperiDONE  0.25 MG tablet Commonly known as: RISPERDAL  Take 1 tablet (0.25 mg total) by mouth at bedtime. What changed: when to take this   rosuvastatin  20 MG tablet Commonly known as: Crestor  Take 1 tablet (20 mg total) by mouth daily. What changed: when to take this   senna 8.6 MG Tabs tablet Commonly known as: SENOKOT Take 2 tablets by mouth daily.   timolol  0.5 % ophthalmic solution Commonly known as: TIMOPTIC  Place 1 drop into both eyes 2 (two)  times daily.   Vitamin D3 75 MCG (3000 UT) Tabs Take 1 tablet by mouth daily. Take one by mouth daily               Discharge Care Instructions  (From admission, onward)           Start     Ordered   12/13/23 0000  Discharge wound care:       Comments: : Cleanse sacral wound with saline, apply single layer of xeroform and top with foam. Change every other day and PRN soilage.  12/08/23 1419   12/13/23 1029            Follow-up Information     Thersia Flax, MD Follow up in 1 week(s).   Specialty: Internal Medicine Why: Hospital follow up Contact information: [address] [place] [address] [phone]         Geraline Knapp, MD Follow up in 2 week(s).   Specialty: Urology Contact information: [address] [place] [address] [phone]                Discharge Exam: Audrey Duran Weights   12/13/23 0235  Weight: 54.6 kg   General exam: Appears calm and comfortable  Respiratory system:  Clear to auscultation. Respiratory effort normal. Cardiovascular system: S1 & S2 heard, RRR. No JVD, murmurs, rubs, gallops or clicks. No pedal edema. Gastrointestinal system: Abdomen is nondistended, soft and nontender. No organomegaly or masses felt. Normal bowel sounds heard. Central nervous system: Alert and oriented. No focal neurological deficits. Extremities: Symmetric 5 x 5 power. Skin: No rashes, lesions or ulcers Psychiatry: Judgement and insight appear normal. Mood & affect appropriate.    Condition at discharge: good  The results of significant diagnostics from this hospitalization (including imaging, microbiology, ancillary and laboratory) are listed below for reference.   Imaging Studies: DG Chest 1 View Result Date: 12/06/2023 CLINICAL DATA:  Hypoxia EXAM: CHEST  1 VIEW COMPARISON:  September 01, 2023 FINDINGS: The heart size and mediastinal contours are within normal limits. Both lungs are clear. The visualized skeletal structures are unremarkable. IMPRESSION: No active disease. Electronically Signed   By: Fredrich Jefferson M.D.   On: 12/06/2023 11:39   DG Hip Unilat With Pelvis 2-3 Views Right Result Date: 12/06/2023 CLINICAL DATA:  Status post fall EXAM: DG HIP (WITH OR WITHOUT PELVIS) 2-3V RIGHT COMPARISON:  None Available. FINDINGS: There is no evidence of hip fracture or dislocation. There is no evidence of arthropathy or other focal bone abnormality. Prior ORIF right hip IMPRESSION: Negative. Electronically Signed   By: Fredrich Jefferson M.D.   On: 12/06/2023 11:39   CT Head Wo Contrast Result Date: 12/06/2023 CLINICAL DATA:  Head trauma, minor EXAM: CT HEAD WITHOUT CONTRAST CT CERVICAL SPINE WITHOUT CONTRAST TECHNIQUE: Multidetector CT imaging of the head and cervical spine was performed following the standard protocol without intravenous contrast. Multiplanar CT image reconstructions of the cervical spine were also generated. RADIATION DOSE REDUCTION: This exam was performed  according to the departmental dose-optimization program which includes automated exposure control, adjustment of the mA and/or kV according to patient size and/or use of iterative reconstruction technique. COMPARISON:  03/05/2023 FINDINGS: CT HEAD FINDINGS Brain: No evidence of acute infarction, hemorrhage, hydrocephalus, extra-axial collection or mass lesion/mass effect. Generalized atrophy. Chronic small vessel ischemia in the deep cerebral white matter. Vascular: No hyperdense vessel or unexpected calcification. Skull: Normal. Negative for fracture or focal lesion. Sinuses/Orbits: No acute finding. CT  CERVICAL SPINE FINDINGS Alignment: No traumatic malalignment Skull base and vertebrae: No acute fracture. No primary bone lesion or focal pathologic process. Soft tissues and spinal canal: No prevertebral fluid or swelling. No visible canal hematoma. Disc levels:  Ordinary degenerative change for age. Upper chest: No evidence of injury IMPRESSION: No evidence of intracranial or cervical spine injury. Electronically Signed   By: Ronnette Coke M.D.   On: 12/06/2023 11:03   CT Cervical Spine Wo Contrast Result Date: 12/06/2023 CLINICAL DATA:  Head trauma, minor EXAM: CT HEAD WITHOUT CONTRAST CT CERVICAL SPINE WITHOUT CONTRAST TECHNIQUE: Multidetector CT imaging of the head and cervical spine was performed following the standard protocol without intravenous contrast. Multiplanar CT image reconstructions of the cervical spine were also generated. RADIATION DOSE REDUCTION: This exam was performed according to the departmental dose-optimization program which includes automated exposure control, adjustment of the mA and/or kV according to patient size and/or use of iterative reconstruction technique. COMPARISON:  03/05/2023 FINDINGS: CT HEAD FINDINGS Brain: No evidence of acute infarction, hemorrhage, hydrocephalus, extra-axial collection or mass lesion/mass effect. Generalized atrophy. Chronic small vessel ischemia in  the deep cerebral white matter. Vascular: No hyperdense vessel or unexpected calcification. Skull: Normal. Negative for fracture or focal lesion. Sinuses/Orbits: No acute finding. CT CERVICAL SPINE FINDINGS Alignment: No traumatic malalignment Skull base and vertebrae: No acute fracture. No primary bone lesion or focal pathologic process. Soft tissues and spinal canal: No prevertebral fluid or swelling. No visible canal hematoma. Disc levels:  Ordinary degenerative change for age. Upper chest: No evidence of injury IMPRESSION: No evidence of intracranial or cervical spine injury. Electronically Signed   By: Ronnette Coke M.D.   On: 12/06/2023 11:03    Microbiology: Results for orders placed or performed during the hospital encounter of 12/06/23  Resp panel by RT-PCR (RSV, Flu A&B, Covid) Anterior Nasal Swab     Status: None   Collection Time: 12/06/23 10:27 AM   Specimen: Anterior Nasal Swab  Result Value Ref Range Status   SARS Coronavirus 2 by RT PCR NEGATIVE NEGATIVE Final    Comment: (NOTE) SARS-CoV-2 target nucleic acids are NOT DETECTED.  The SARS-CoV-2 RNA is generally detectable in upper respiratory specimens during the acute phase of infection. The lowest concentration of SARS-CoV-2 viral copies this assay can detect is 138 copies/mL. A negative result does not preclude SARS-Cov-2 infection and should not be used as the sole basis for treatment or other patient management decisions. A negative result may occur with  improper specimen collection/handling, submission of specimen other than nasopharyngeal swab, presence of viral mutation(s) within the areas targeted by this assay, and inadequate number of viral copies(<138 copies/mL). A negative result must be combined with clinical observations, patient history, and epidemiological information. The expected result is Negative.  Fact Sheet for Patients:  BloggerCourse.com  Fact Sheet for Healthcare  Providers:  SeriousBroker.it  This test is no t yet approved or cleared by the [place]  FDA and  has been authorized for detection and/or diagnosis of SARS-CoV-2 by FDA under an Emergency Use Authorization (EUA). This EUA will remain  in effect (meaning this test can be used) for the duration of the COVID-19 declaration under Section 564(b)(1) of the Act, 21 U.S.C.section 360bbb-3(b)(1), unless the authorization is terminated  or revoked sooner.       Influenza A by PCR NEGATIVE NEGATIVE Final   Influenza B by PCR NEGATIVE NEGATIVE Final    Comment: (NOTE) The Xpert Xpress SARS-CoV-2/FLU/RSV plus assay is intended as an  aid in the diagnosis of influenza from Nasopharyngeal swab specimens and should not be used as a sole basis for treatment. Nasal washings and aspirates are unacceptable for Xpert Xpress SARS-CoV-2/FLU/RSV testing.  Fact Sheet for Patients: BloggerCourse.com  Fact Sheet for Healthcare Providers: SeriousBroker.it  This test is not yet approved or cleared by the [place]  FDA and has been authorized for detection and/or diagnosis of SARS-CoV-2 by FDA under an Emergency Use Authorization (EUA). This EUA will remain in effect (meaning this test can be used) for the duration of the COVID-19 declaration under Section 564(b)(1) of the Act, 21 U.S.C. section 360bbb-3(b)(1), unless the authorization is terminated or revoked.     Resp Syncytial Virus by PCR NEGATIVE NEGATIVE Final    Comment: (NOTE) Fact Sheet for Patients: BloggerCourse.com  Fact Sheet for Healthcare Providers: SeriousBroker.it  This test is not yet approved or cleared by the [place]  FDA and has been authorized for detection and/or diagnosis of SARS-CoV-2 by FDA under an Emergency Use Authorization (EUA). This EUA will remain in effect (meaning this test can be  used) for the duration of the COVID-19 declaration under Section 564(b)(1) of the Act, 21 U.S.C. section 360bbb-3(b)(1), unless the authorization is terminated or revoked.  Performed at [facility], [address], [place], [place] [address]   Urine Culture (for pregnant, neutropenic or urologic patients or patients with an indwelling urinary catheter)     Status: None   Collection Time: 12/08/23  4:50 PM   Specimen: Urine, Catheterized  Result Value Ref Range Status   Specimen Description   Final    URINE, CATHETERIZED Performed at [facility], [address], [place], [place] [address]    Special Requests   Final    NONE Performed at [facility], [address] [place], [place] [address]    Culture   Final    NO GROWTH Performed at [facility], [address], [place], [place] [address]    Report Status 12/09/2023 FINAL  Final    Labs: CBC: Recent Labs  Lab 12/07/23 0353 12/08/23 0509 12/09/23 0615 12/11/23 0502 12/13/23 0355  WBC 9.3 9.7 9.1 9.5 12.6*  HGB 9.2* 9.4* 9.1* 8.5* 9.0*  HCT 29.0* 29.7* 28.9* 26.8* 28.5*  MCV 93.2 93.4 92.9 91.5 93.1  PLT 162 158 158 158 176   Basic Metabolic Panel: Recent Labs  Lab 12/07/23 0353 12/08/23 0509 12/09/23 0615 12/11/23 0502 12/13/23 0355  NA 137 135 136 135 136  K 5.2* 4.5 4.5 3.7 3.7  CL 110 109 106 109 105  CO2 21* 23 21* 20* 23  GLUCOSE 103* 114* 101* 102* 138*  BUN 60* 48* 41* 46* 34*  CREATININE 2.28* 1.75* 1.60* 1.52* 1.47*  CALCIUM  9.2 9.3 9.4 8.3* 8.8*  MG  --  1.9 1.8 1.7  --    Liver Function Tests: No results for input(s): "AST", "ALT", "ALKPHOS", "BILITOT", "PROT", "ALBUMIN" in the last 168 hours. CBG: Recent Labs  Lab 12/07/23 1615 12/07/23 2022 12/12/23 0742 12/12/23 1144 12/12/23 2132  GLUCAP 135* 132* 82 132* 134*    Discharge time spent: greater than 30 minutes.  Signed: Donaciano Frizzle, MD [facility] 12/13/2023

## 2023-12-14 DIAGNOSIS — L89303 Pressure ulcer of unspecified buttock, stage 3: Secondary | ICD-10-CM | POA: Diagnosis not present

## 2023-12-14 DIAGNOSIS — I959 Hypotension, unspecified: Secondary | ICD-10-CM | POA: Diagnosis not present

## 2023-12-14 DIAGNOSIS — N179 Acute kidney failure, unspecified: Secondary | ICD-10-CM | POA: Diagnosis not present

## 2023-12-14 DIAGNOSIS — I480 Paroxysmal atrial fibrillation: Secondary | ICD-10-CM | POA: Diagnosis not present

## 2023-12-14 DIAGNOSIS — N189 Chronic kidney disease, unspecified: Secondary | ICD-10-CM | POA: Diagnosis not present

## 2023-12-14 DIAGNOSIS — I10 Essential (primary) hypertension: Secondary | ICD-10-CM | POA: Diagnosis not present

## 2023-12-14 DIAGNOSIS — K921 Melena: Secondary | ICD-10-CM | POA: Diagnosis not present

## 2023-12-14 NOTE — Plan of Care (Signed)
  Problem: Health Behavior/Discharge Planning: Goal: Ability to manage health-related needs will improve Outcome: Progressing   Problem: Clinical Measurements: Goal: Respiratory complications will improve Outcome: Progressing   Problem: Nutrition: Goal: Adequate nutrition will be maintained Outcome: Progressing   Problem: Elimination: Goal: Will not experience complications related to bowel motility Outcome: Progressing

## 2023-12-14 NOTE — TOC Transition Note (Addendum)
 Transition of Care [facility]) - Discharge Note   Patient Details  Name: Audrey Duran MRN: [id number] Date of Birth: Jan 07, 1935  Transition of Care [facility]) CM/SW Contact:  Crayton Docker, RN 12/14/2023, 12:01 PM   Clinical Narrative:      [facility] still pending for EMS transport per [facility] [employer]. Per Debria Fang, EMS [facility] is under medical review. Per Debria Fang, no auth needed for return to memory care unit at ALF. Patient to discharge and return to [facility] via facility transport at 1230. CM follow up call placed to [facility], [employer], phone: [phone], regarding facility transport at 1230 and patient return.  CM follow up call placed to patient's daughter, [name] regarding facility transport and return to [facility] [address] Per patient's daughter verbalized understanding and agreement.   CM follow up call placed to [employer] [employer], phone: [phone] regarding cancelling transportation auth.   Alert from Dr. Jeane Miguel regarding Midodrine . Per Dr. Jeane Miguel, updated discharge summary. CM refaxed discharge summary to [facility] [address]. CM follow up call placed to [facility] [place]. CM spoke to Dustin regarding updated discharge summary refaxed. Per Ilean Mall verbalized understanding and agreement.   Final next level of care: Home w Home Health Services Barriers to Discharge: No Barriers Identified   Patient Goals and CMS Choice    Return to prior facility/[facility]   Discharge Placement    Return to prior facility/[facility]          Discharge Plan and Services Additional resources added to the After Visit Summary for      ALF/Memory Care Unit DME: Deveron Fly            DME Arranged: Otho Blitz hemi DME Agency: [employer] Date DME Agency Contacted: 12/13/23 Time DME Agency Contacted: 1050 Representative spoke with at DME Agency: Sam Creighton HH Arranged: RN, PT, OT [facility] Agency: [facility] Date  [facility] Agency Contacted: 12/13/23 Time HH Agency Contacted: 1051 Representative spoke with at [facility] Agency: Louanna Rouse  Social Drivers of Health (SDOH) Interventions SDOH Screenings   Food Insecurity: No Food Insecurity (12/06/2023)  Housing: Low Risk  (12/06/2023)  Transportation Needs: No Transportation Needs (12/06/2023)  Utilities: Not At Risk (12/06/2023)  Depression (PHQ2-9): Low Risk  (10/02/2022)  Financial Resource Strain: Low Risk  (12/03/2020)  Social Connections: Moderately Isolated (12/06/2023)  Stress: No Stress Concern Present (12/03/2020)  Tobacco Use: Medium Risk (12/06/2023)     Readmission Risk Interventions     No data to display

## 2023-12-14 NOTE — Discharge Summary (Addendum)
 Physician Discharge Summary   Patient: Audrey Duran MRN: [id number] DOB: 09-Nov-1934  Admit date:     12/06/2023  Discharge date: 12/14/23  Discharge Physician: Donaciano Frizzle   PCP: Thersia Flax, MD   Recommendations at discharge:    Follow-up with PCP in 1 week. Follow-up with urology in 2 weeks. May gradually wean down midodrine  if blood pressure continues to improve.  Discharge Diagnoses: Principal Problem:   AKI (acute kidney injury) (HCC) Active Problems:   COPD (chronic obstructive pulmonary disease) (HCC)   Orthostatic hypotension   Acute kidney injury superimposed on chronic kidney disease (HCC)   Acute hypoxic respiratory failure (HCC)   Anxiety state   Paroxysmal atrial fibrillation (HCC)   Hyperkalemia   CAD (coronary artery disease), native coronary artery   Essential hypertension   Protein-calorie malnutrition (HCC)   Compression fracture of T11 vertebra (HCC)   (HFpEF) heart failure with preserved ejection fraction (HCC)   CKD stage 3b, GFR 30-44 ml/min (HCC)   Falling   Pressure injury of skin  Resolved Problems:   * No resolved hospital problems. [facility] Course: Audrey Duran is an 89 year old female with history of paroxysmal atrial fibrillation on aspirin , history of hypertension, dementia, chronic kidney stage IIIb, hyperlipidemia, with history of hypertension, who presents to emergency department for chief concerns of fall at [facility] via EMS. She was found to have acute renal failure, bladder scan showed residual of 962, Foley catheter anchored.  Patient was also found to have profound orthostatic hypotension.  Midodrine  was started. Patient will need nursing home placement. Since then, patient condition had improved, did well with physical therapy.  Family no longer wants nursing home, will discharge to assisted living facility. Patient was unable to discharge yesterday due to lacking insurance approval.  Assisted living is willing  to take patient today. Assessment and Plan: Fall at home, likely syncope and collapse. Orthostatic hypotension. Patient had a fall at home, she has a profound orthostatic hypotension, she probably had a syncope collapse at home. Midodrine  was started on 5/14, continue to follow orthostatic vital signs.  Cortisol level 11.7, no significant adrenal insufficiency. Orthostatic vital signs improved after midodrine .  Continue midodrine , decrease dose to twice a day.  May gradually wean down the dose if blood pressure continue to improve. Condition has improved.   Acute kidney injury on chronic kidney stage IIIb. Severe urinary retention. Hyperkalemia secondary to urinary tension. Minimal metabolic acidosis. Patient received the fluids, renal functions better.  However, patient had a significant urinary retention with residual 962 mL.  Foley catheter anchored.  UA does not support a urinary tract infection diagnosis. Patient will be followed with urology in 2 weeks after discharge.   Renal function continued to improve, has mild metabolic acidosis, which had improved. Follow-up with urology in 2 weeks.   Anemia of chronic disease. B12 and iron levels are normal.  Hemoglobin stable.    Hypoxemic resp failure ruled out. COPD. COPD stable, reviewed chart, patient has no documented hypoxemia.  Patient also has no short of breath.   Essential hypertension. All medicines are on hold due to low blood pressure.   Dementia. Continue to follow.   Pressure ulcer POA.  Wound care order placed, follow-up with PCP as outpatient. Pressure Injury 12/08/23 Sacrum Medial Stage 3 -  Full thickness tissue loss. Subcutaneous fat may be visible but bone, tendon or muscle are NOT exposed. WOC assessment; changed Staging; reviewed 2 nurse skin assessment (POA) (Active)  12/08/23 (Reported from previous shift, present on admission, present prior to arrival of my shift.) 0745  Location: Sacrum  Location  Orientation: Medial  Staging: Stage 3 -  Full thickness tissue loss. Subcutaneous fat may be visible but bone, tendon or muscle are NOT exposed.  Wound Description (Comments): WOC assessment; changed Staging; reviewed 2 nurse skin assessment (POA)  Present on Admission: Yes            Consultants: None Procedures performed: None  Disposition: Assisted living Diet recommendation:  Discharge Diet Orders (From admission, onward)     Start     Ordered   12/13/23 0000  Diet - low sodium heart healthy        12/13/23 1029           Cardiac diet DISCHARGE MEDICATION: Allergies as of 12/14/2023       Reactions   Azithromycin    GI upset   Clarithromycin    ? Rash   Diclofenac    ? Rash   Lisinopril  Other (See Comments)   hyperkalemia   Meloxicam    ? Rash   Nitrofurantoin Monohyd Macro    ? rash   Zocor [simvastatin]    Penicillins Rash        Medication List     STOP taking these medications    acyclovir  400 MG tablet Commonly known as: ZOVIRAX    furosemide  20 MG tablet Commonly known as: LASIX    lisinopril  5 MG tablet Commonly known as: ZESTRIL        TAKE these medications    acetaminophen  325 MG tablet Commonly known as: TYLENOL  Take by mouth every 4 (four) hours as needed.   alendronate  70 MG tablet Commonly known as: FOSAMAX  Take 1 tablet by mouth every 7 days on an empty stomach with a full glass of water   alum & mag hydroxide-simeth 200-200-20 MG/5ML suspension Commonly known as: MAALOX/MYLANTA Take 30 mLs by mouth every 6 (six) hours as needed for indigestion or heartburn.   aspirin  81 MG tablet Take 81 mg by mouth at bedtime.   citalopram  20 MG tablet Commonly known as: CELEXA  Take 1 tablet (20 mg total) by mouth daily.   guaifenesin  100 MG/5ML syrup Commonly known as: ROBITUSSIN Take 300 mg by mouth 4 (four) times daily as needed for cough.   Iron 325 (65 Fe) MG Tabs Take 1 tablet by mouth daily.   lidocaine  5 %  ointment Commonly known as: XYLOCAINE  Apply 1 Application topically as needed.   loperamide 2 MG capsule Commonly known as: IMODIUM Take 4 mg by mouth as needed for diarrhea or loose stools.   LORazepam  0.5 MG tablet Commonly known as: ATIVAN  Take 0.5 mg by mouth every 6 (six) hours as needed for anxiety (NOT TO EXCEED TWO DOSES IN 24 HOURS).   magnesium  hydroxide 400 MG/5ML suspension Commonly known as: MILK OF MAGNESIA Take 30 mLs by mouth daily as needed for mild constipation.   magnesium  oxide 400 MG tablet Commonly known as: MAG-OX Take 400 mg by mouth daily.   midodrine  2.5 MG tablet Commonly known as: PROAMATINE  Take 1 tablet (2.5 mg total) by mouth 2 (two) times daily with a meal.   mirtazapine  7.5 MG tablet Commonly known as: REMERON  Take 1 tablet (7.5 mg total) by mouth at bedtime.   risperiDONE  0.25 MG tablet Commonly known as: RISPERDAL  Take 1 tablet (0.25 mg total) by mouth at bedtime. What changed: when to take this   rosuvastatin  20 MG tablet  Commonly known as: Crestor  Take 1 tablet (20 mg total) by mouth daily. What changed: when to take this   senna 8.6 MG Tabs tablet Commonly known as: SENOKOT Take 2 tablets by mouth daily.   timolol  0.5 % ophthalmic solution Commonly known as: TIMOPTIC  Place 1 drop into both eyes 2 (two) times daily.   Vitamin D3 75 MCG (3000 UT) Tabs Take 1 tablet by mouth daily. Take one by mouth daily               Durable Medical Equipment  (From admission, onward)           Start     Ordered   12/13/23 1242  For home use only DME Walker rolling  Once       Question Answer Comment  Walker: With 5 Inch Wheels   Patient needs a walker to treat with the following condition Generalized weakness      12/13/23 1241              Discharge Care Instructions  (From admission, onward)           Start     Ordered   12/13/23 0000  Discharge wound care:       Comments: : Cleanse sacral wound with  saline, apply single layer of xeroform and top with foam. Change every other day and PRN soilage.  12/08/23 1419   12/13/23 1029            Contact information for follow-up providers     Thersia Flax, MD Follow up in 1 week(s).   Specialty: Internal Medicine Why: Hospital follow up Contact information: [address] [place] [address] [phone]         Geraline Knapp, MD Follow up in 2 week(s).   Specialty: Urology Contact information: [address] [place] [address] [phone]         [facility] Follow up.   Why: 05/19---Per Louanna Rouse, following for home health PT/OT services Contact information: [address] [place] [address] [phone]         [facility] Follow up.   Why: 05/19---Rolling walker order via [employer]. Contact information: [address] [address] [phone]              Contact information for after-discharge care     Destination     [facility] .   Service: Assisted Living Contact information: [address] [place]  [address] [phone]                    Discharge Exam: Cleavon Curls Weights   12/13/23 0235  Weight: 54.6 kg   General exam: Appears calm and comfortable  Respiratory system: Clear to auscultation. Respiratory effort normal. Cardiovascular system: S1 & S2 heard, RRR. No JVD, murmurs, rubs, gallops or clicks. No pedal edema. Gastrointestinal system: Abdomen is nondistended, soft and nontender. No organomegaly or masses felt. Normal bowel sounds heard. Central nervous system: Alert and oriented. No focal neurological deficits. Extremities: Symmetric 5 x 5 power. Skin: No rashes, lesions or ulcers Psychiatry: Judgement and insight appear normal. Mood & affect appropriate.    Condition at discharge: good  The results of significant diagnostics  from this hospitalization (including imaging, microbiology, ancillary and laboratory) are listed below for reference.   Imaging Studies: DG Chest 1 View Result Date: 12/06/2023 CLINICAL  DATA:  Hypoxia EXAM: CHEST  1 VIEW COMPARISON:  September 01, 2023 FINDINGS: The heart size and mediastinal contours are within normal limits. Both lungs are clear. The visualized skeletal structures are unremarkable. IMPRESSION: No active disease. Electronically Signed   By: Fredrich Jefferson M.D.   On: 12/06/2023 11:39   DG Hip Unilat With Pelvis 2-3 Views Right Result Date: 12/06/2023 CLINICAL DATA:  Status post fall EXAM: DG HIP (WITH OR WITHOUT PELVIS) 2-3V RIGHT COMPARISON:  None Available. FINDINGS: There is no evidence of hip fracture or dislocation. There is no evidence of arthropathy or other focal bone abnormality. Prior ORIF right hip IMPRESSION: Negative. Electronically Signed   By: Fredrich [facility] M.D.   On: 12/06/2023 11:39   CT Head Wo Contrast Result Date: 12/06/2023 CLINICAL DATA:  Head trauma, minor EXAM: CT HEAD WITHOUT CONTRAST CT CERVICAL SPINE WITHOUT CONTRAST TECHNIQUE: Multidetector CT imaging of the head and cervical spine was performed following the standard protocol without intravenous contrast. Multiplanar CT image reconstructions of the cervical spine were also generated. RADIATION DOSE REDUCTION: This exam was performed according to the departmental dose-optimization program which includes automated exposure control, adjustment of the mA and/or kV according to patient size and/or use of iterative reconstruction technique. COMPARISON:  03/05/2023 FINDINGS: CT HEAD FINDINGS Brain: No evidence of acute infarction, hemorrhage, hydrocephalus, extra-axial collection or mass lesion/mass effect. Generalized atrophy. Chronic small vessel ischemia in the deep cerebral white matter. Vascular: No hyperdense vessel or unexpected calcification. Skull: Normal. Negative for fracture or focal lesion. Sinuses/Orbits:  No acute finding. CT CERVICAL SPINE FINDINGS Alignment: No traumatic malalignment Skull base and vertebrae: No acute fracture. No primary bone lesion or focal pathologic process. Soft tissues and spinal canal: No prevertebral fluid or swelling. No visible canal hematoma. Disc levels:  Ordinary degenerative change for age. Upper chest: No evidence of injury IMPRESSION: No evidence of intracranial or cervical spine injury. Electronically Signed   By: Ronnette Coke M.D.   On: 12/06/2023 11:03   CT Cervical Spine Wo Contrast Result Date: 12/06/2023 CLINICAL DATA:  Head trauma, minor EXAM: CT HEAD WITHOUT CONTRAST CT CERVICAL SPINE WITHOUT CONTRAST TECHNIQUE: Multidetector CT imaging of the head and cervical spine was performed following the standard protocol without intravenous contrast. Multiplanar CT image reconstructions of the cervical spine were also generated. RADIATION DOSE REDUCTION: This exam was performed according to the departmental dose-optimization program which includes automated exposure control, adjustment of the mA and/or kV according to patient size and/or use of iterative reconstruction technique. COMPARISON:  03/05/2023 FINDINGS: CT HEAD FINDINGS Brain: No evidence of acute infarction, hemorrhage, hydrocephalus, extra-axial collection or mass lesion/mass effect. Generalized atrophy. Chronic small vessel ischemia in the deep cerebral white matter. Vascular: No hyperdense vessel or unexpected calcification. Skull: Normal. Negative for fracture or focal lesion. Sinuses/Orbits: No acute finding. CT CERVICAL SPINE FINDINGS Alignment: No traumatic malalignment Skull base and vertebrae: No acute fracture. No primary bone lesion or focal pathologic process. Soft tissues and spinal canal: No prevertebral fluid or swelling. No visible canal hematoma. Disc levels:  Ordinary degenerative change for age. Upper chest: No evidence of injury IMPRESSION: No evidence of intracranial or cervical spine injury.  Electronically Signed   By: Ronnette Coke M.D.   On: 12/06/2023 11:03    Microbiology: Results for orders placed or performed during the hospital encounter of 12/06/23  Resp panel by RT-PCR (RSV, Flu A&B, Covid) Anterior Nasal Swab     Status: None   Collection Time: 12/06/23 10:27 AM  Specimen: Anterior Nasal Swab  Result Value Ref Range Status   SARS Coronavirus 2 by RT PCR NEGATIVE NEGATIVE Final    Comment: (NOTE) SARS-CoV-2 target nucleic acids are NOT DETECTED.  The SARS-CoV-2 RNA is generally detectable in upper respiratory specimens during the acute phase of infection. The lowest concentration of SARS-CoV-2 viral copies this assay can detect is 138 copies/mL. A negative result does not preclude SARS-Cov-2 infection and should not be used as the sole basis for treatment or other patient management decisions. A negative result may occur with  improper specimen collection/handling, submission of specimen other than nasopharyngeal swab, presence of viral mutation(s) within the areas targeted by this assay, and inadequate number of viral copies(<138 copies/mL). A negative result must be combined with clinical observations, patient history, and epidemiological information. The expected result is Negative.  Fact Sheet for Patients:  BloggerCourse.com  Fact Sheet for Healthcare Providers:  SeriousBroker.it  This test is no t yet approved or cleared by the [place]  FDA and  has been authorized for detection and/or diagnosis of SARS-CoV-2 by FDA under an Emergency Use Authorization (EUA). This EUA will remain  in effect (meaning this test can be used) for the duration of the COVID-19 declaration under Section 564(b)(1) of the Act, 21 U.S.C.section 360bbb-3(b)(1), unless the authorization is terminated  or revoked sooner.       Influenza A by PCR NEGATIVE NEGATIVE Final   Influenza B by PCR NEGATIVE NEGATIVE Final     Comment: (NOTE) The Xpert Xpress SARS-CoV-2/FLU/RSV plus assay is intended as an aid in the diagnosis of influenza from Nasopharyngeal swab specimens and should not be used as a sole basis for treatment. Nasal washings and aspirates are unacceptable for Xpert Xpress SARS-CoV-2/FLU/RSV testing.  Fact Sheet for Patients: BloggerCourse.com  Fact Sheet for Healthcare Providers: SeriousBroker.it  This test is not yet approved or cleared by the [place]  FDA and has been authorized for detection and/or diagnosis of SARS-CoV-2 by FDA under an Emergency Use Authorization (EUA). This EUA will remain in effect (meaning this test can be used) for the duration of the COVID-19 declaration under Section 564(b)(1) of the Act, 21 U.S.C. section 360bbb-3(b)(1), unless the authorization is terminated or revoked.     Resp Syncytial Virus by PCR NEGATIVE NEGATIVE Final    Comment: (NOTE) Fact Sheet for Patients: BloggerCourse.com  Fact Sheet for Healthcare Providers: SeriousBroker.it  This test is not yet approved or cleared by the [place]  FDA and has been authorized for detection and/or diagnosis of SARS-CoV-2 by FDA under an Emergency Use Authorization (EUA). This EUA will remain in effect (meaning this test can be used) for the duration of the COVID-19 declaration under Section 564(b)(1) of the Act, 21 U.S.C. section 360bbb-3(b)(1), unless the authorization is terminated or revoked.  Performed at [facility], [address], [place], [place] [address]   Urine Culture (for pregnant, neutropenic or urologic patients or patients with an indwelling urinary catheter)     Status: None   Collection Time: 12/08/23  4:50 PM   Specimen: Urine, Catheterized  Result Value Ref Range Status   Specimen Description   Final    URINE, CATHETERIZED Performed at [facility], [address], [place], [place] [address]    Special Requests   Final    NONE Performed at [facility], [address], [place], [place] [address]    Culture   Final    NO GROWTH Performed at [facility], [address] [place], [place] [address]    Report Status 12/09/2023 FINAL  Final    Labs: CBC: Recent Labs  Lab 12/08/23 0509 12/09/23 0615 12/11/23 0502 12/13/23 0355  WBC 9.7 9.1 9.5 12.6*  HGB 9.4* 9.1* 8.5* 9.0*  HCT 29.7* 28.9* 26.8* 28.5*  MCV 93.4 92.9 91.5 93.1  PLT 158 158 158 176   Basic Metabolic Panel: Recent Labs  Lab 12/08/23 0509 12/09/23 0615 12/11/23 0502 12/13/23 0355  NA 135 136 135 136  K 4.5 4.5 3.7 3.7  CL 109 106 109 105  CO2 23 21* 20* 23  GLUCOSE 114* 101* 102* 138*  BUN 48* 41* 46* 34*  CREATININE 1.75* 1.60* 1.52* 1.47*  CALCIUM  9.3 9.4 8.3* 8.8*  MG 1.9 1.8 1.7  --    Liver Function Tests: No results for input(s): "AST", "ALT", "ALKPHOS", "BILITOT", "PROT", "ALBUMIN" in the last 168 hours. CBG: Recent Labs  Lab 12/07/23 1615 12/07/23 2022 12/12/23 0742 12/12/23 1144 12/12/23 2132  GLUCAP 135* 132* 82 132* 134*    Discharge time spent: greater than 30 minutes.  Signed: Donaciano Frizzle, MD [facility] 12/14/2023

## 2023-12-14 NOTE — Progress Notes (Addendum)
 I called report to Nurse Jolly Needle who was reported that pt will be returning today with a foley catheter that is ordered to be sent with patient and to follow up with urology.

## 2023-12-16 DIAGNOSIS — N1832 Chronic kidney disease, stage 3b: Secondary | ICD-10-CM | POA: Diagnosis not present

## 2023-12-16 DIAGNOSIS — N179 Acute kidney failure, unspecified: Secondary | ICD-10-CM | POA: Diagnosis not present

## 2023-12-16 DIAGNOSIS — J449 Chronic obstructive pulmonary disease, unspecified: Secondary | ICD-10-CM | POA: Diagnosis not present

## 2023-12-16 DIAGNOSIS — L89152 Pressure ulcer of sacral region, stage 2: Secondary | ICD-10-CM | POA: Diagnosis not present

## 2023-12-16 DIAGNOSIS — D631 Anemia in chronic kidney disease: Secondary | ICD-10-CM | POA: Diagnosis not present

## 2023-12-16 DIAGNOSIS — I130 Hypertensive heart and chronic kidney disease with heart failure and stage 1 through stage 4 chronic kidney disease, or unspecified chronic kidney disease: Secondary | ICD-10-CM | POA: Diagnosis not present

## 2023-12-16 DIAGNOSIS — F03911 Unspecified dementia, unspecified severity, with agitation: Secondary | ICD-10-CM | POA: Diagnosis not present

## 2023-12-16 DIAGNOSIS — I5032 Chronic diastolic (congestive) heart failure: Secondary | ICD-10-CM | POA: Diagnosis not present

## 2023-12-16 DIAGNOSIS — J9601 Acute respiratory failure with hypoxia: Secondary | ICD-10-CM | POA: Diagnosis not present

## 2023-12-16 DIAGNOSIS — F0393 Unspecified dementia, unspecified severity, with mood disturbance: Secondary | ICD-10-CM | POA: Diagnosis not present

## 2023-12-17 ENCOUNTER — Emergency Department
Admission: EM | Admit: 2023-12-17 | Discharge: 2023-12-17 | Disposition: A | Discharge status: Skilled Nursing Facility | Attending: Emergency Medicine | Admitting: Emergency Medicine

## 2023-12-17 ENCOUNTER — Emergency Department

## 2023-12-17 ENCOUNTER — Other Ambulatory Visit: Payer: Self-pay

## 2023-12-17 DIAGNOSIS — N390 Urinary tract infection, site not specified: Secondary | ICD-10-CM | POA: Insufficient documentation

## 2023-12-17 DIAGNOSIS — R918 Other nonspecific abnormal finding of lung field: Secondary | ICD-10-CM | POA: Diagnosis not present

## 2023-12-17 DIAGNOSIS — R319 Hematuria, unspecified: Secondary | ICD-10-CM | POA: Insufficient documentation

## 2023-12-17 DIAGNOSIS — R0602 Shortness of breath: Secondary | ICD-10-CM | POA: Diagnosis not present

## 2023-12-17 DIAGNOSIS — L89303 Pressure ulcer of unspecified buttock, stage 3: Secondary | ICD-10-CM | POA: Diagnosis not present

## 2023-12-17 DIAGNOSIS — N179 Acute kidney failure, unspecified: Secondary | ICD-10-CM | POA: Diagnosis not present

## 2023-12-17 DIAGNOSIS — I959 Hypotension, unspecified: Secondary | ICD-10-CM | POA: Diagnosis not present

## 2023-12-17 DIAGNOSIS — R531 Weakness: Secondary | ICD-10-CM | POA: Insufficient documentation

## 2023-12-17 DIAGNOSIS — F0390 Unspecified dementia without behavioral disturbance: Secondary | ICD-10-CM | POA: Insufficient documentation

## 2023-12-17 DIAGNOSIS — I10 Essential (primary) hypertension: Secondary | ICD-10-CM | POA: Diagnosis not present

## 2023-12-17 DIAGNOSIS — R0689 Other abnormalities of breathing: Secondary | ICD-10-CM | POA: Diagnosis not present

## 2023-12-17 LAB — URINALYSIS, W/ REFLEX TO CULTURE (INFECTION SUSPECTED)
Bilirubin Urine: NEGATIVE
Glucose, UA: NEGATIVE mg/dL
Ketones, ur: NEGATIVE mg/dL
Nitrite: NEGATIVE
Protein, ur: NEGATIVE mg/dL
RBC / HPF: >50 RBC/hpf (ref 0–5)
Specific Gravity, Urine: 1.015 (ref 1.005–1.030)
pH: 5 (ref 5.0–8.0)

## 2023-12-17 LAB — CBC WITH DIFFERENTIAL/PLATELET
Abs Immature Granulocytes: 0.06 10*3/uL (ref 0.00–0.07)
Basophils Absolute: 0 10*3/uL (ref 0.0–0.1)
Basophils Relative: 0 %
Eosinophils Absolute: 0.6 10*3/uL — ABNORMAL HIGH (ref 0.0–0.5)
Eosinophils Relative: 6 %
HCT: 30.7 % — ABNORMAL LOW (ref 36.0–46.0)
Hemoglobin: 9.7 g/dL — ABNORMAL LOW (ref 12.0–15.0)
Immature Granulocytes: 1 %
Lymphocytes Relative: 19 %
Lymphs Abs: 2 10*3/uL (ref 0.7–4.0)
MCH: 29.7 pg (ref 26.0–34.0)
MCHC: 31.6 g/dL (ref 30.0–36.0)
MCV: 93.9 fL (ref 80.0–100.0)
Monocytes Absolute: 0.8 10*3/uL (ref 0.1–1.0)
Monocytes Relative: 8 %
Neutro Abs: 6.9 10*3/uL (ref 1.7–7.7)
Neutrophils Relative %: 66 %
Platelets: 215 10*3/uL (ref 150–400)
RBC: 3.27 MIL/uL — ABNORMAL LOW (ref 3.87–5.11)
RDW: 13.8 % (ref 11.5–15.5)
WBC: 10.4 10*3/uL (ref 4.0–10.5)
nRBC: 0 % (ref 0.0–0.2)

## 2023-12-17 LAB — COMPREHENSIVE METABOLIC PANEL WITH GFR
ALT: 12 U/L (ref 0–44)
AST: 16 U/L (ref 15–41)
Albumin: 2.5 g/dL — ABNORMAL LOW (ref 3.5–5.0)
Alkaline Phosphatase: 63 U/L (ref 38–126)
Anion gap: 7 (ref 5–15)
BUN: 35 mg/dL — ABNORMAL HIGH (ref 8–23)
CO2: 26 mmol/L (ref 22–32)
Calcium: 9.5 mg/dL (ref 8.9–10.3)
Chloride: 105 mmol/L (ref 98–111)
Creatinine, Ser: 1.33 mg/dL — ABNORMAL HIGH (ref 0.44–1.00)
GFR, Estimated: 39 mL/min — ABNORMAL LOW (ref >60)
Glucose, Bld: 119 mg/dL — ABNORMAL HIGH (ref 70–99)
Potassium: 4.1 mmol/L (ref 3.5–5.1)
Sodium: 138 mmol/L (ref 135–145)
Total Bilirubin: 0.6 mg/dL (ref 0.0–1.2)
Total Protein: 6.1 g/dL — ABNORMAL LOW (ref 6.5–8.1)

## 2023-12-17 LAB — RESP PANEL BY RT-PCR (RSV, FLU A&B, COVID)  RVPGX2
Influenza A by PCR: NEGATIVE
Influenza B by PCR: NEGATIVE
Resp Syncytial Virus by PCR: NEGATIVE
SARS Coronavirus 2 by RT PCR: NEGATIVE

## 2023-12-17 LAB — PROTIME-INR
INR: 1.2 (ref 0.8–1.2)
Prothrombin Time: 14.9 s (ref 11.4–15.2)

## 2023-12-17 LAB — LACTIC ACID, PLASMA: Lactic Acid, Venous: 0.9 mmol/L (ref 0.5–1.9)

## 2023-12-17 MED ORDER — CEFUROXIME AXETIL 250 MG PO TABS: 250.0000 mg | ORAL_TABLET | Freq: Two times a day (BID) | ORAL | 0 refills | Status: DC

## 2023-12-17 MED ORDER — SODIUM CHLORIDE 0.9 % IV SOLN
2.0000 g | Freq: Once | INTRAVENOUS | Status: AC
  Administered 2023-12-17: 2 g via INTRAVENOUS
  Filled 2023-12-17: qty 20

## 2023-12-17 MED ORDER — LACTATED RINGERS IV BOLUS (SEPSIS)
1000.0000 mL | Freq: Once | INTRAVENOUS | Status: AC
  Administered 2023-12-17: 1000 mL via INTRAVENOUS

## 2023-12-17 MED ORDER — LACTATED RINGERS IV SOLN: INTRAVENOUS | Status: DC

## 2023-12-17 NOTE — ED Notes (Signed)
 Pt's daughter updated.

## 2023-12-17 NOTE — ED Notes (Signed)
 Patient gave verbal consent to update daughter

## 2023-12-17 NOTE — ED Notes (Signed)
 This RN attempted to call and update pt's daughter, but got no answer.

## 2023-12-17 NOTE — ED Notes (Signed)
 [facility]  CALLED  FOR  TRANSPORT   [facility]

## 2023-12-17 NOTE — Consult Note (Signed)
 CODE SEPSIS - PHARMACY COMMUNICATION  **[facility] Antibiotics should be administered within 1 hour of Sepsis diagnosis**  Time Code Sepsis Called/Page Received: 0807  Antibiotics Ordered: ceftriaxone   Time of 1st antibiotic administration: 0832  Additional action taken by pharmacy: N/A  Ramonita Burow ,PharmD Clinical Pharmacist  12/17/2023  8:10 AM

## 2023-12-17 NOTE — ED Provider Notes (Signed)
 [facility] Provider Note   Event Date/Time   First MD Initiated Contact with Patient 12/17/23 [phone]     (approximate) History  Shortness of Breath and Weakness  HPI Audrey Duran is a 89 y.o. female with a past medical history of dementia who presents via EMS from a memory care unit after a fall and generalized weakness.  Patient is reportedly at her mental baseline.  Patient was seen a week prior by the same EMS crew for similar symptoms and was hospitalized until 3 days prior to arrival.  Patient also has a new Foley catheter in and is unclear as to why it was placed.  Patient has no complaints at this time ROS: Patient currently denies any vision changes, tinnitus, difficulty speaking, facial droop, sore throat, chest pain, shortness of breath, abdominal pain, nausea/vomiting/diarrhea, dysuria, or weakness/numbness/paresthesias in any extremity   Physical Exam  Triage Vital Signs: ED Triage Vitals  Encounter Vitals Group     BP      Systolic BP Percentile      Diastolic BP Percentile      Pulse      Resp      Temp      Temp src      SpO2      Weight      Height      Head Circumference      Peak Flow      Pain Score      Pain Loc      Pain Education      Exclude from Growth Chart    Most recent vital signs: Vitals:   12/17/23 0930 12/17/23 1000  BP: (!) 148/58 (!) 145/64  Pulse: 76 72  Resp: (!) 26 (!) 22  Temp:    SpO2: 96% 96%   General: Awake, oriented x4. CV:  Good peripheral perfusion.  Resp:  Normal effort.  Abd:  No distention.  Other:  Elderly overweight Caucasian female resting comfortably in no acute distress.  Foley catheter in place draining cloudy urine ED Results / Procedures / Treatments  Labs (all labs ordered are listed, but only abnormal results are displayed) Labs Reviewed  COMPREHENSIVE METABOLIC PANEL WITH GFR - Abnormal; Notable for the following components:      Result Value   Glucose, Bld 119 (*)    BUN 35  (*)    Creatinine, Ser 1.33 (*)    Total Protein 6.1 (*)    Albumin 2.5 (*)    GFR, Estimated 39 (*)    All other components within normal limits  CBC WITH DIFFERENTIAL/PLATELET - Abnormal; Notable for the following components:   RBC 3.27 (*)    Hemoglobin 9.7 (*)    HCT 30.7 (*)    Eosinophils Absolute 0.6 (*)    All other components within normal limits  URINALYSIS, W/ REFLEX TO CULTURE (INFECTION SUSPECTED) - Abnormal; Notable for the following components:   Color, Urine YELLOW (*)    APPearance HAZY (*)    Hgb urine dipstick MODERATE (*)    Leukocytes,Ua SMALL (*)    Bacteria, UA MANY (*)    All other components within normal limits  RESP PANEL BY RT-PCR (RSV, FLU A&B, COVID)  [id number]  CULTURE, BLOOD (ROUTINE X 2)  CULTURE, BLOOD (ROUTINE X 2)  URINE CULTURE  LACTIC ACID, PLASMA  PROTIME-INR   EKG ED ECG REPORT I, Charleen Conn, the attending physician, personally viewed and interpreted this ECG. Date: 12/17/2023 EKG Time:  1610 Rate: 79 Rhythm: normal sinus rhythm QRS Axis: normal Intervals: normal ST/T Wave abnormalities: normal Narrative Interpretation: no evidence of acute ischemia RADIOLOGY ED MD interpretation: One-view portable chest x-ray interpreted by me shows no evidence of acute abnormalities including no pneumonia, pneumothorax, or widened mediastinum.  There is incidentally found mild prominence of interstitial markings with slight upper lung zone predominance and unchanged from previous studies -Agree with radiology assessment Official radiology report(s): DG Chest Port 1 View Result Date: 12/17/2023 CLINICAL DATA:  Questionable sepsis - evaluate for abnormality. Weakness. Shortness of breath on exertion. EXAM: PORTABLE CHEST 1 VIEW COMPARISON:  12/06/2023. FINDINGS: Mild prominence of interstitial markings with slight upper lung zone predominance is essentially unchanged since prior studies. This is nonspecific and may be due to underlying pulmonary edema,  atypical pneumonia or interstitial lung disease. Bilateral lung fields are otherwise clear. No dense consolidation or lung collapse. Bilateral costophrenic angles are clear. Normal cardio-mediastinal silhouette. No acute osseous abnormalities. The soft tissues are within normal limits. IMPRESSION: Mild prominence of interstitial markings with slight upper lung zone predominance is essentially unchanged since prior studies. This is nonspecific and may be due to underlying pulmonary edema, atypical pneumonia or interstitial lung disease. Electronically Signed   By: Beula Brunswick M.D.   On: 12/17/2023 08:34   PROCEDURES: Critical Care performed: No Procedures MEDICATIONS ORDERED IN ED: Medications  lactated ringers  infusion (has no administration in time range)  lactated ringers  bolus 1,000 mL (1,000 mLs Intravenous New Bag/Given 12/17/23 0834)  cefTRIAXone  (ROCEPHIN ) 2 g in sodium chloride  0.9 % 100 mL IVPB (0 g Intravenous Stopped 12/17/23 0902)   IMPRESSION / MDM / ASSESSMENT AND PLAN / ED COURSE  I reviewed the triage vital signs and the nursing notes.                             The patient is on the cardiac monitor to evaluate for evidence of arrhythmia and/or significant heart rate changes. Patient's presentation is most consistent with acute presentation with potential threat to life or bodily function. Not Pregnant. Unlikely TOA, Ovarian Torsion, PID, gonorrhea/chlamydia. Low suspicion for Infected Urolithiasis, AAA, Cholecystitis, Pancreatitis, SBO, Appendicitis, or other acute abdomen.  Rx: Ceftin  250 mg BID for 5 days Disposition: Discharge home. SRP discussed. Advise follow up with primary care provider within 24-72 hours.   FINAL CLINICAL IMPRESSION(S) / ED DIAGNOSES   Final diagnoses:  Generalized weakness  Urinary tract infection with hematuria, site unspecified   Rx / DC Orders   ED Discharge Orders          Ordered    cefUROXime  (CEFTIN ) 250 MG tablet  2 times daily  with meals        12/17/23 1120           Note:  This document was prepared using Dragon voice recognition software and may include unintentional dictation errors.   Charleen Conn, MD 12/17/23 [phone]

## 2023-12-17 NOTE — ED Triage Notes (Signed)
 Pt to ED via [facility] from the [facility] for c/o weakness, SOB on exertion, and decreased appetite. Pt here on the 12th for fall and states back pain from fall. Pt has recently placed catheter in place.  Pt has hx dementia, answering questions appropriately at this time.

## 2023-12-17 NOTE — Sepsis Progress Note (Signed)
 eLink is following this Code Sepsis.

## 2023-12-20 LAB — URINE CULTURE: Culture: 30000 — AB

## 2023-12-21 DIAGNOSIS — L89303 Pressure ulcer of unspecified buttock, stage 3: Secondary | ICD-10-CM | POA: Diagnosis not present

## 2023-12-21 NOTE — Progress Notes (Signed)
 ED Antimicrobial Stewardship Positive Culture Follow Up   Audrey Duran is an 89 y.o. female who presented to [facility] on 12/17/2023 with a chief complaint of  Chief Complaint  Patient presents with   Shortness of Breath   Weakness    Recent Results (from the past 720 hours)  Resp panel by RT-PCR (RSV, Flu A&B, Covid) Anterior Nasal Swab     Status: None   Collection Time: 12/06/23 10:27 AM   Specimen: Anterior Nasal Swab  Result Value Ref Range Status   SARS Coronavirus 2 by RT PCR NEGATIVE NEGATIVE Final    Comment: (NOTE) SARS-CoV-2 target nucleic acids are NOT DETECTED.  The SARS-CoV-2 RNA is generally detectable in upper respiratory specimens during the acute phase of infection. The lowest concentration of SARS-CoV-2 viral copies this assay can detect is 138 copies/mL. A negative result does not preclude SARS-Cov-2 infection and should not be used as the sole basis for treatment or other patient management decisions. A negative result may occur with  improper specimen collection/handling, submission of specimen other than nasopharyngeal swab, presence of viral mutation(s) within the areas targeted by this assay, and inadequate number of viral copies(<138 copies/mL). A negative result must be combined with clinical observations, patient history, and epidemiological information. The expected result is Negative.  Fact Sheet for Patients:  BloggerCourse.com  Fact Sheet for Healthcare Providers:  SeriousBroker.it  This test is no t yet approved or cleared by the [place]  FDA and  has been authorized for detection and/or diagnosis of SARS-CoV-2 by FDA under an Emergency Use Authorization (EUA). This EUA will remain  in effect (meaning this test can be used) for the duration of the COVID-19 declaration under Section 564(b)(1) of the Act, 21 U.S.C.section 360bbb-3(b)(1), unless the authorization is terminated  or  revoked sooner.       Influenza A by PCR NEGATIVE NEGATIVE Final   Influenza B by PCR NEGATIVE NEGATIVE Final    Comment: (NOTE) The Xpert Xpress SARS-CoV-2/FLU/RSV plus assay is intended as an aid in the diagnosis of influenza from Nasopharyngeal swab specimens and should not be used as a sole basis for treatment. Nasal washings and aspirates are unacceptable for Xpert Xpress SARS-CoV-2/FLU/RSV testing.  Fact Sheet for Patients: BloggerCourse.com  Fact Sheet for Healthcare Providers: SeriousBroker.it  This test is not yet approved or cleared by the [place]  FDA and has been authorized for detection and/or diagnosis of SARS-CoV-2 by FDA under an Emergency Use Authorization (EUA). This EUA will remain in effect (meaning this test can be used) for the duration of the COVID-19 declaration under Section 564(b)(1) of the Act, 21 U.S.C. section 360bbb-3(b)(1), unless the authorization is terminated or revoked.     Resp Syncytial Virus by PCR NEGATIVE NEGATIVE Final    Comment: (NOTE) Fact Sheet for Patients: BloggerCourse.com  Fact Sheet for Healthcare Providers: SeriousBroker.it  This test is not yet approved or cleared by the [place]  FDA and has been authorized for detection and/or diagnosis of SARS-CoV-2 by FDA under an Emergency Use Authorization (EUA). This EUA will remain in effect (meaning this test can be used) for the duration of the COVID-19 declaration under Section 564(b)(1) of the Act, 21 U.S.C. section 360bbb-3(b)(1), unless the authorization is terminated or revoked.  Performed at [facility], [address], [place], [place] [address]   Urine Culture (for pregnant, neutropenic or urologic patients or patients with an indwelling urinary catheter)     Status: None   Collection Time: 12/08/23  4:50 PM   Specimen: Urine, Catheterized  Result  Value Ref Range Status   Specimen Description   Final    URINE, CATHETERIZED Performed at [facility], [address] [place], [place] [address]    Special Requests   Final    NONE Performed at [facility], [address], [place], [place] [address]    Culture   Final    NO GROWTH Performed at [facility], [address] [place], [place] [address]    Report Status 12/09/2023 FINAL  Final  Resp panel by RT-PCR (RSV, Flu A&B, Covid) Anterior Nasal Swab     Status: None   Collection Time: 12/17/23  8:17 AM   Specimen: Anterior Nasal Swab  Result Value Ref Range Status   SARS Coronavirus 2 by RT PCR NEGATIVE NEGATIVE Final    Comment: (NOTE) SARS-CoV-2 target nucleic acids are NOT DETECTED.  The SARS-CoV-2 RNA is generally detectable in upper respiratory specimens during the acute phase of infection. The lowest concentration of SARS-CoV-2 viral copies this assay can detect is 138 copies/mL. A negative result does not preclude SARS-Cov-2 infection and should not be used as the sole basis for treatment or other patient management decisions. A negative result may occur with  improper specimen collection/handling, submission of specimen other than nasopharyngeal swab, presence of viral mutation(s) within the areas targeted by this assay, and inadequate number of viral copies(<138 copies/mL). A negative result must be combined with clinical observations, patient history, and epidemiological information. The expected result is Negative.  Fact Sheet for Patients:  BloggerCourse.com  Fact Sheet for Healthcare Providers:  SeriousBroker.it  This test is no t yet approved or cleared by the [place]  FDA and  has been authorized for detection and/or diagnosis of SARS-CoV-2 by FDA under an Emergency Use Authorization (EUA). This EUA will remain  in effect (meaning this test can be used) for the duration of  the COVID-19 declaration under Section 564(b)(1) of the Act, 21 U.S.C.section 360bbb-3(b)(1), unless the authorization is terminated  or revoked sooner.       Influenza A by PCR NEGATIVE NEGATIVE Final   Influenza B by PCR NEGATIVE NEGATIVE Final    Comment: (NOTE) The Xpert Xpress SARS-CoV-2/FLU/RSV plus assay is intended as an aid in the diagnosis of influenza from Nasopharyngeal swab specimens and should not be used as a sole basis for treatment. Nasal washings and aspirates are unacceptable for Xpert Xpress SARS-CoV-2/FLU/RSV testing.  Fact Sheet for Patients: BloggerCourse.com  Fact Sheet for Healthcare Providers: SeriousBroker.it  This test is not yet approved or cleared by the [place]  FDA and has been authorized for detection and/or diagnosis of SARS-CoV-2 by FDA under an Emergency Use Authorization (EUA). This EUA will remain in effect (meaning this test can be used) for the duration of the COVID-19 declaration under Section 564(b)(1) of the Act, 21 U.S.C. section 360bbb-3(b)(1), unless the authorization is terminated or revoked.     Resp Syncytial Virus by PCR NEGATIVE NEGATIVE Final    Comment: (NOTE) Fact Sheet for Patients: BloggerCourse.com  Fact Sheet for Healthcare Providers: SeriousBroker.it  This test is not yet approved or cleared by the [place]  FDA and has been authorized for detection and/or diagnosis of SARS-CoV-2 by FDA under an Emergency Use Authorization (EUA). This EUA will remain in effect (meaning this test can be used) for the duration of the COVID-19 declaration under Section 564(b)(1) of the Act, 21 U.S.C. section 360bbb-3(b)(1), unless the authorization is terminated or  revoked.  Performed at [facility], [address] [place], [place] [address]   Blood Culture (routine x 2)     Status: None (Preliminary result)    Collection Time: 12/17/23  8:17 AM   Specimen: BLOOD  Result Value Ref Range Status   Specimen Description BLOOD BLOOD LEFT ARM  Final   Special Requests   Final    BOTTLES DRAWN AEROBIC AND ANAEROBIC Blood Culture adequate volume   Culture   Final    NO GROWTH 4 DAYS Performed at [facility], [address] [place], [place] [address]    Report Status PENDING  Incomplete  Urine Culture     Status: Abnormal   Collection Time: 12/17/23  8:56 AM   Specimen: Urine, Random  Result Value Ref Range Status   Specimen Description   Final    URINE, RANDOM Performed at [facility], [address] [place], [place] [address]    Special Requests   Final    NONE Reflexed from [id number] Performed at [facility], [address] [place], [place] [address]    Culture (A)  Final    30,000 COLONIES/mL ENTEROCOCCUS FAECALIS 60,000 COLONIES/mL AEROCOCCUS SPECIES Standardized susceptibility testing for this organism is not available. Performed at [facility], [address], [place], [place] [address]    Report Status 12/20/2023 FINAL  Final   Organism ID, Bacteria ENTEROCOCCUS FAECALIS (A)  Final      Susceptibility   Enterococcus faecalis - MIC*    AMPICILLIN <=2 SENSITIVE Sensitive     NITROFURANTOIN <=16 SENSITIVE Sensitive     VANCOMYCIN  1 SENSITIVE Sensitive     * 30,000 COLONIES/mL ENTEROCOCCUS FAECALIS   Patient discharged on cephalosporin, which will not cover the enterococcus faecalis in the urine culture. Patient was transferred to assisted living facility on discharge - Pharmacy called and left voicemail with the on-site physician at facility in regards to switching antibiotics. Will follow-up with facility in the morning.  Spoke with Dustin at [facility] on 12/22/23 - will fax culture results to nursing office on-site for provider to review and adjust antibiotics accordingly.  New antibiotic recommendation: amoxicillin  250 mg every 12 hours  for 5 days  ED Provider: Claria Crofts, MD  Ananias Balls 12/21/2023, 2:02 PM Clinical Pharmacist Monday - Friday phone -  [phone] Saturday - Sunday phone - [phone]

## 2023-12-22 LAB — CULTURE, BLOOD (ROUTINE X 2)
Culture: NO GROWTH
Special Requests: ADEQUATE

## 2023-12-23 ENCOUNTER — Telehealth: Payer: Self-pay

## 2023-12-23 DIAGNOSIS — J449 Chronic obstructive pulmonary disease, unspecified: Secondary | ICD-10-CM | POA: Diagnosis not present

## 2023-12-23 NOTE — Telephone Encounter (Signed)
 Copied from CRM [id number]. Topic: Clinical - Home Health Verbal Orders >> Dec 23, 2023  4:27 PM Crispin Dolphin wrote: Caller/Agency: [facility]  Callback Number: [phone] Service Requested: Skilled Nursing - Wound Care  Frequency: 2 times per week  Any new concerns about the patient? Yes - new wound broken skin left buttox

## 2023-12-24 DIAGNOSIS — N390 Urinary tract infection, site not specified: Secondary | ICD-10-CM | POA: Diagnosis not present

## 2023-12-24 DIAGNOSIS — A498 Other bacterial infections of unspecified site: Secondary | ICD-10-CM | POA: Diagnosis not present

## 2023-12-24 NOTE — Telephone Encounter (Signed)
 Spoke with Kathaleen Pale to let her know that we have not seen this pt in a year and when I spoke to her daughter in March she stated that the pt had been moved to a memory care facility, seeing their provider. I let Kathaleen Pale know that when I spoke with the daughter she was going to reach back out to us  to let us  know if pt was going to continue following with Dr. Tullo so we could get her scheduled if so, daughter has not called back. I let Kathaleen Pale know that we would not be able to give the orders that they would need to come from the physician there at the facility.

## 2023-12-25 ENCOUNTER — Emergency Department
Admission: EM | Admit: 2023-12-25 | Discharge: 2023-12-25 | Disposition: A | Discharge status: Home / Self Care | Attending: Emergency Medicine | Admitting: Emergency Medicine

## 2023-12-25 ENCOUNTER — Other Ambulatory Visit: Payer: Self-pay

## 2023-12-25 DIAGNOSIS — Y732 Prosthetic and other implants, materials and accessory gastroenterology and urology devices associated with adverse incidents: Secondary | ICD-10-CM | POA: Diagnosis not present

## 2023-12-25 DIAGNOSIS — T83098A Other mechanical complication of other indwelling urethral catheter, initial encounter: Secondary | ICD-10-CM | POA: Insufficient documentation

## 2023-12-25 DIAGNOSIS — T839XXA Unspecified complication of genitourinary prosthetic device, implant and graft, initial encounter: Secondary | ICD-10-CM

## 2023-12-25 DIAGNOSIS — T83091A Other mechanical complication of indwelling urethral catheter, initial encounter: Secondary | ICD-10-CM | POA: Diagnosis not present

## 2023-12-25 DIAGNOSIS — R339 Retention of urine, unspecified: Secondary | ICD-10-CM | POA: Insufficient documentation

## 2023-12-25 DIAGNOSIS — Z7401 Bed confinement status: Secondary | ICD-10-CM | POA: Diagnosis not present

## 2023-12-25 DIAGNOSIS — R319 Hematuria, unspecified: Secondary | ICD-10-CM | POA: Diagnosis not present

## 2023-12-25 DIAGNOSIS — I959 Hypotension, unspecified: Secondary | ICD-10-CM | POA: Diagnosis not present

## 2023-12-25 LAB — URINALYSIS, ROUTINE W REFLEX MICROSCOPIC
Bilirubin Urine: NEGATIVE
Glucose, UA: NEGATIVE mg/dL
Ketones, ur: NEGATIVE mg/dL
Nitrite: NEGATIVE
Protein, ur: 30 mg/dL — AB
Specific Gravity, Urine: 1.012 (ref 1.005–1.030)
WBC, UA: >50 WBC/hpf (ref 0–5)
pH: 5 (ref 5.0–8.0)

## 2023-12-25 MED ORDER — CEFUROXIME AXETIL 250 MG PO TABS: 250.0000 mg | ORAL_TABLET | Freq: Two times a day (BID) | ORAL | 0 refills | Status: DC

## 2023-12-25 NOTE — ED Notes (Addendum)
 This RN spoke to daughter for Pt update Audrey Duran, Panagopoulos

## 2023-12-25 NOTE — ED Triage Notes (Signed)
 Pt BIB [facility] from [facility] for blood in her Foley bag per the facility. Pt denies any pain/SOB. Facility attempted to drain and flush foley but was unsuccessful.

## 2023-12-25 NOTE — ED Notes (Signed)
 This RN called report to [employer] and spoke to a RN [phone]

## 2023-12-25 NOTE — ED Provider Notes (Signed)
 [facility] Provider Note   Event Date/Time   First MD Initiated Contact with Patient 12/25/23 1630     (approximate) History  Foley problems  HPI Audrey Duran is a 89 y.o. female with a past medical history of chronic indwelling Foley catheter secondary to urinary retention who presents via EMS from the [facility] for blood in her Foley bag and inability to drain or flush the Foley.  Patient denies any pain, shortness of breath, or sensation that she needs to urinate. ROS: Patient currently denies any vision changes, tinnitus, difficulty speaking, facial droop, sore throat, chest pain, shortness of breath, abdominal pain, nausea/vomiting/diarrhea, dysuria, or weakness/numbness/paresthesias in any extremity   Physical Exam  Triage Vital Signs: ED Triage Vitals  Encounter Vitals Group     BP 12/25/23 1631 (!) 123/56     Systolic BP Percentile --      Diastolic BP Percentile --      Pulse Rate 12/25/23 1631 66     Resp 12/25/23 1631 20     Temp 12/25/23 1631 98.2 F (36.8 C)     Temp Source 12/25/23 1631 Oral     SpO2 12/25/23 1631 99 %     Weight 12/25/23 1632 115 lb (52.2 kg)     Height 12/25/23 1632 5\' 3"  (1.6 m)     Head Circumference --      Peak Flow --      Pain Score 12/25/23 1632 0     Pain Loc --      Pain Education --      Exclude from Growth Chart --    Most recent vital signs: Vitals:   12/25/23 1631 12/25/23 1700  BP: (!) 123/56 (!) 133/56  Pulse: 66 71  Resp: 20   Temp: 98.2 F (36.8 C)   SpO2: 99% 100%   General: Awake, oriented x4. CV:  Good peripheral perfusion. Resp:  Normal effort. Abd:  No distention. Other:  Elderly well-developed, well-nourished Caucasian female with Foley catheter in place resting comfortably in no acute distress.  No blood appreciated in Foley tubing or bag.  Unable to flush ED Results / Procedures / Treatments  Labs (all labs ordered are listed, but only abnormal results are  displayed) Labs Reviewed  URINALYSIS, ROUTINE W REFLEX MICROSCOPIC   PROCEDURES: Critical Care performed: No Procedures MEDICATIONS ORDERED IN ED: Medications - No data to display IMPRESSION / MDM / ASSESSMENT AND PLAN / ED COURSE  I reviewed the triage vital signs and the nursing notes.                             The patient is on the cardiac monitor to evaluate for evidence of arrhythmia and/or significant heart rate changes. Patient's presentation is most consistent with acute presentation with potential threat to life or bodily function. 89 year old female presents for Foley catheter malfunction Well appearing/nonseptic. Tolerating PO.  ED Workup: UA ED Interventions: Catheter placement  Patient exam and history not consistent with cauda equina, infectious etiology, constipation based retention/intraabdominal mass/AAA, trauma, nephro/urolithiasis, drug reaction, cancer. UA shows mild hematuria and bacteria.  This is a chronic indwelling Foley catheter however will cover empirically for urinary tract infection given the hematuria.  Patient encouraged to follow-up with her primary care physician for resolution of this urinary tract infection Disposition: Discharge with catheter placed to leg bag and urology follow up within 1 week. Strict return precautions and catheter  care discussed.   FINAL CLINICAL IMPRESSION(S) / ED DIAGNOSES   Final diagnoses:  Foley catheter problem, initial encounter [facility])   Rx / DC Orders   ED Discharge Orders     None      Note:  This document was prepared using Dragon voice recognition software and may include unintentional dictation errors.   Esmae Donathan K, MD 12/25/23 [phone]

## 2023-12-27 DIAGNOSIS — I10 Essential (primary) hypertension: Secondary | ICD-10-CM | POA: Diagnosis not present

## 2023-12-27 DIAGNOSIS — K921 Melena: Secondary | ICD-10-CM | POA: Diagnosis not present

## 2023-12-27 DIAGNOSIS — J449 Chronic obstructive pulmonary disease, unspecified: Secondary | ICD-10-CM | POA: Diagnosis not present

## 2023-12-27 DIAGNOSIS — R296 Repeated falls: Secondary | ICD-10-CM | POA: Diagnosis not present

## 2023-12-27 DIAGNOSIS — N179 Acute kidney failure, unspecified: Secondary | ICD-10-CM | POA: Diagnosis not present

## 2023-12-27 DIAGNOSIS — I959 Hypotension, unspecified: Secondary | ICD-10-CM | POA: Diagnosis not present

## 2023-12-27 DIAGNOSIS — L89303 Pressure ulcer of unspecified buttock, stage 3: Secondary | ICD-10-CM | POA: Diagnosis not present

## 2023-12-27 DIAGNOSIS — E46 Unspecified protein-calorie malnutrition: Secondary | ICD-10-CM | POA: Diagnosis not present

## 2023-12-27 DIAGNOSIS — R609 Edema, unspecified: Secondary | ICD-10-CM | POA: Diagnosis not present

## 2023-12-27 DIAGNOSIS — F411 Generalized anxiety disorder: Secondary | ICD-10-CM | POA: Diagnosis not present

## 2023-12-27 DIAGNOSIS — N390 Urinary tract infection, site not specified: Secondary | ICD-10-CM | POA: Diagnosis not present

## 2023-12-27 DIAGNOSIS — E785 Hyperlipidemia, unspecified: Secondary | ICD-10-CM | POA: Diagnosis not present

## 2024-01-03 ENCOUNTER — Emergency Department

## 2024-01-03 ENCOUNTER — Other Ambulatory Visit: Payer: Self-pay

## 2024-01-03 ENCOUNTER — Emergency Department
Admission: EM | Admit: 2024-01-03 | Discharge: 2024-01-03 | Disposition: A | Discharge status: Home / Self Care | Attending: Emergency Medicine | Admitting: Emergency Medicine

## 2024-01-03 ENCOUNTER — Telehealth: Payer: Self-pay

## 2024-01-03 DIAGNOSIS — I480 Paroxysmal atrial fibrillation: Secondary | ICD-10-CM | POA: Diagnosis not present

## 2024-01-03 DIAGNOSIS — R918 Other nonspecific abnormal finding of lung field: Secondary | ICD-10-CM | POA: Diagnosis not present

## 2024-01-03 DIAGNOSIS — R531 Weakness: Secondary | ICD-10-CM | POA: Diagnosis not present

## 2024-01-03 DIAGNOSIS — F411 Generalized anxiety disorder: Secondary | ICD-10-CM | POA: Diagnosis not present

## 2024-01-03 DIAGNOSIS — I517 Cardiomegaly: Secondary | ICD-10-CM | POA: Diagnosis not present

## 2024-01-03 DIAGNOSIS — M549 Dorsalgia, unspecified: Secondary | ICD-10-CM | POA: Diagnosis not present

## 2024-01-03 DIAGNOSIS — I2510 Atherosclerotic heart disease of native coronary artery without angina pectoris: Secondary | ICD-10-CM | POA: Insufficient documentation

## 2024-01-03 DIAGNOSIS — I700 Atherosclerosis of aorta: Secondary | ICD-10-CM | POA: Diagnosis not present

## 2024-01-03 DIAGNOSIS — S0990XA Unspecified injury of head, initial encounter: Secondary | ICD-10-CM | POA: Diagnosis not present

## 2024-01-03 DIAGNOSIS — M5136 Other intervertebral disc degeneration, lumbar region with discogenic back pain only: Secondary | ICD-10-CM | POA: Diagnosis not present

## 2024-01-03 DIAGNOSIS — S32010A Wedge compression fracture of first lumbar vertebra, initial encounter for closed fracture: Secondary | ICD-10-CM | POA: Insufficient documentation

## 2024-01-03 DIAGNOSIS — W19XXXA Unspecified fall, initial encounter: Secondary | ICD-10-CM | POA: Diagnosis not present

## 2024-01-03 DIAGNOSIS — J449 Chronic obstructive pulmonary disease, unspecified: Secondary | ICD-10-CM | POA: Insufficient documentation

## 2024-01-03 DIAGNOSIS — I10 Essential (primary) hypertension: Secondary | ICD-10-CM | POA: Diagnosis not present

## 2024-01-03 DIAGNOSIS — R0602 Shortness of breath: Secondary | ICD-10-CM | POA: Diagnosis not present

## 2024-01-03 DIAGNOSIS — W07XXXA Fall from chair, initial encounter: Secondary | ICD-10-CM | POA: Diagnosis not present

## 2024-01-03 DIAGNOSIS — E785 Hyperlipidemia, unspecified: Secondary | ICD-10-CM | POA: Diagnosis not present

## 2024-01-03 DIAGNOSIS — Z043 Encounter for examination and observation following other accident: Secondary | ICD-10-CM | POA: Diagnosis not present

## 2024-01-03 DIAGNOSIS — F0390 Unspecified dementia without behavioral disturbance: Secondary | ICD-10-CM | POA: Diagnosis not present

## 2024-01-03 DIAGNOSIS — M419 Scoliosis, unspecified: Secondary | ICD-10-CM | POA: Diagnosis not present

## 2024-01-03 DIAGNOSIS — S199XXA Unspecified injury of neck, initial encounter: Secondary | ICD-10-CM | POA: Diagnosis not present

## 2024-01-03 DIAGNOSIS — Z743 Need for continuous supervision: Secondary | ICD-10-CM | POA: Diagnosis not present

## 2024-01-03 DIAGNOSIS — I959 Hypotension, unspecified: Secondary | ICD-10-CM | POA: Diagnosis not present

## 2024-01-03 DIAGNOSIS — J9601 Acute respiratory failure with hypoxia: Secondary | ICD-10-CM | POA: Diagnosis not present

## 2024-01-03 DIAGNOSIS — R0989 Other specified symptoms and signs involving the circulatory and respiratory systems: Secondary | ICD-10-CM | POA: Diagnosis not present

## 2024-01-03 DIAGNOSIS — M19012 Primary osteoarthritis, left shoulder: Secondary | ICD-10-CM | POA: Diagnosis not present

## 2024-01-03 DIAGNOSIS — L89303 Pressure ulcer of unspecified buttock, stage 3: Secondary | ICD-10-CM | POA: Diagnosis not present

## 2024-01-03 DIAGNOSIS — M4856XA Collapsed vertebra, not elsewhere classified, lumbar region, initial encounter for fracture: Secondary | ICD-10-CM | POA: Diagnosis not present

## 2024-01-03 DIAGNOSIS — K921 Melena: Secondary | ICD-10-CM | POA: Diagnosis not present

## 2024-01-03 DIAGNOSIS — R0902 Hypoxemia: Secondary | ICD-10-CM | POA: Diagnosis not present

## 2024-01-03 DIAGNOSIS — I5030 Unspecified diastolic (congestive) heart failure: Secondary | ICD-10-CM | POA: Diagnosis not present

## 2024-01-03 DIAGNOSIS — D508 Other iron deficiency anemias: Secondary | ICD-10-CM | POA: Diagnosis not present

## 2024-01-03 DIAGNOSIS — R102 Pelvic and perineal pain: Secondary | ICD-10-CM | POA: Diagnosis not present

## 2024-01-03 MED ORDER — DOXYCYCLINE HYCLATE 100 MG PO TABS
100.0000 mg | ORAL_TABLET | Freq: Once | ORAL | Status: AC
  Administered 2024-01-03: 100 mg via ORAL
  Filled 2024-01-03: qty 1

## 2024-01-03 MED ORDER — DOXYCYCLINE HYCLATE 100 MG PO CAPS: 100.0000 mg | ORAL_CAPSULE | Freq: Two times a day (BID) | ORAL | 0 refills | Status: AC

## 2024-01-03 MED ORDER — ACETAMINOPHEN 325 MG PO TABS
650.0000 mg | ORAL_TABLET | Freq: Once | ORAL | Status: AC
  Administered 2024-01-03: 650 mg via ORAL
  Filled 2024-01-03: qty 2

## 2024-01-03 NOTE — Discharge Instructions (Addendum)
 Your CT of your cervical spine and of your head did not show any acute abnormalities.  The chest x-ray shows evidence of pneumonia so we will treat this with oral antibiotics.  Please take the doxycycline  twice a day, once in the morning once at night for 5 days.  Please wear the TSLO brace for comfort.  You will need to follow-up with neurosurgery.  Their information is attached.  Please call the office to schedule.  You can take 650 mg of Tylenol  every 6 hours as needed for pain. You can use ice, heat, muscle creams and other topical pain relievers as well.

## 2024-01-03 NOTE — ED Provider Notes (Signed)
 [facility] Provider Note    Event Date/Time   First MD Initiated Contact with Patient 01/03/24 1525     (approximate)   History   Fall   HPI  Audrey Duran is a 89 y.o. female with PMH of MI, afib, CAD, COPD, dementia, CKD, HFpEF among other things listed in the chart, presents for evaluation after a fall. Patient is a resident of the [facility] who had a mechanical fall.  Patient had just finished physical therapy when she fell getting out of her chair.  Patient is unable to provide any history.  Reports pain to her lower back.      Physical Exam   Triage Vital Signs: ED Triage Vitals  Encounter Vitals Group     BP 01/03/24 1343 114/63     Systolic BP Percentile --      Diastolic BP Percentile --      Pulse Rate 01/03/24 1343 87     Resp 01/03/24 1343 20     Temp 01/03/24 1343 97.8 F (36.6 C)     Temp Source 01/03/24 1343 Oral     SpO2 01/03/24 1343 93 %     Weight 01/03/24 1344 120 lb (54.4 kg)     Height 01/03/24 1344 5\' 3"  (1.6 m)     Head Circumference --      Peak Flow --      Pain Score 01/03/24 1341 0     Pain Loc --      Pain Education --      Exclude from Growth Chart --     Most recent vital signs: Vitals:   01/03/24 1343  BP: 114/63  Pulse: 87  Resp: 20  Temp: 97.8 F (36.6 C)  SpO2: 93%   General: Awake, no distress.  CV:  Good peripheral perfusion. RRR. Resp:  Normal effort. CTAB. Abd:  No distention.  Other:  No focal neuro deficits. Sensation maintained in bilateral distal lower extremities.  Strength is equal in legs bilaterally.  Dorsalis pedis pulse 2+ and regular.   ED Results / Procedures / Treatments   Labs (all labs ordered are listed, but only abnormal results are displayed) Labs Reviewed - No data to display  RADIOLOGY  CT head, cervical spine, lumbar spine x-ray and pelvic x-ray obtained.  Interpreted the images as well as reviewed the radiologist report.  Pelvic x-ray is negative  for fracture but does show osteoporosis and osteopenia.  Lumbar x-ray shows L1 compression fracture with approximately 60% loss of height centrally.  Chronic T11 compression fracture and mild to the level degenerative disc disease and facet hypertrophy.  CT head and CT cervical spine negative for any acute abnormalities.  There is interlobular septal thickening and tree-in-bud nodularity.  Will correlate with a chest x-ray.  Also shows diffusely decreased bone density and aortic atherosclerosis.  Chest x-ray shows low lung volumes with mild diffuse interstitial opacity which may be due to an atypical infection less likely edema.  There is also cardiomegaly.  PROCEDURES:  Critical Care performed: No  Procedures   MEDICATIONS ORDERED IN ED: Medications  doxycycline  (VIBRA -TABS) tablet 100 mg (has no administration in time range)  acetaminophen  (TYLENOL ) tablet 650 mg (650 mg Oral Given 01/03/24 1646)     IMPRESSION / MDM / ASSESSMENT AND PLAN / ED COURSE  I reviewed the triage vital signs and the nursing notes.  89 year old female presents for evaluation of lower back pain after a fall.  Vital signs are stable patient NAD on exam.  Differential diagnosis includes, but is not limited to, Compression fracture, contusion, intracranial bleed, closed head injury, muscle strain.  Patient's presentation is most consistent with acute complicated illness / injury requiring diagnostic workup.  Pelvic x-ray is negative.  CT head and CT cervical spine negative for any acute abnormalities aside from interlobular septal thickening and tree-in-bud nodularity.  Chest x-ray obtained to correlate which showed low lung volumes and mild diffuse interstitial opacities which may be due to an atypical infection.  Will cover patient for community-acquired pneumonia and get her started on oral antibiotics.  Physical exam is reassuring but patient does have point tenderness over the lumbar  spine.  Lumbar x-ray shows L1 compression fracture and a chronic T11 fracture.  Explained to patient and patient's family member that this is treated conservatively with a TSLO brace.  Patient still has a TSLO brace from the previous T11 fracture and patient's family member states she will not wear it.  Will provide them with follow-up for neurosurgery.  Patient was discharged in stable condition.     FINAL CLINICAL IMPRESSION(S) / ED DIAGNOSES   Final diagnoses:  Closed compression fracture of body of L1 vertebra (HCC)     Rx / DC Orders   ED Discharge Orders          Ordered    doxycycline  (VIBRAMYCIN ) 100 MG capsule  2 times daily        01/03/24 1914             Note:  This document was prepared using Dragon voice recognition software and may include unintentional dictation errors.   Phyliss Breen, PA-C 01/03/24 1917    Kandee Orion, MD 01/03/24 2059

## 2024-01-03 NOTE — ED Notes (Signed)
 Given food in the ED

## 2024-01-03 NOTE — ED Notes (Signed)
 First Nurse Note: Pt to ED via [facility] from [facility] [place] for unwitnessed fall and lower back pain. Pt denies LOC or blood thinners. Pt has hx/o dementia.

## 2024-01-03 NOTE — Telephone Encounter (Signed)
 Spoke with Tommy to let him know that patient has not been seen by Dr. Tullo in over a year and a half. Consuelo Denmark stated that he apologizes as soon as he called and left the message with us  he was told by his nurse that pt is currently under the care of the physician at the facility.

## 2024-01-03 NOTE — Progress Notes (Signed)
 Orthopedic Tech Progress Note Patient Details:  Audrey Duran 13-Sep-1934 [id number]  Patient ID: Audrey Duran, female   DOB: 10/18/1934, 89 y.o.   MRN: [id number] Nurse called for STAT TLSO brace but cancelled order because the patient already has a brace and fitted for one but doesn't want to wear it.  Udell Gamble 01/03/2024, 8:37 PM

## 2024-01-03 NOTE — ED Triage Notes (Signed)
 Pt to ED from [facility] [place] via [facility] for mechanical fall this morning while getting out of chair, right after doing PT. Pt has yellow DNR form with her. Back was hurting but getting better. Pt unsure if LOC or hit head. Pt remembers falling. Pt is alert and oriented. Pt unsure if thinners. Complains of lower back pain but only if someone touches it.

## 2024-01-03 NOTE — ED Notes (Signed)
 Patient already has TLSO brace that she is noncompliant with wearing. Facility and daughter aware

## 2024-01-03 NOTE — Telephone Encounter (Signed)
 Copied from CRM [id number]. Topic: General - Other >> Jan 03, 2024 10:27 AM Sophia H wrote: Reason for CRM: Tommy - physical therapist from [facility] Calling in to make Dr. Madelon Scheuermann aware of the patients weight still being down, currently it is at 112.8.  Any questions please contact Consuelo Denmark [phone]

## 2024-01-03 NOTE — ED Notes (Signed)
 See triage note  Presents via EMS from [facility]  States she fell getting out of chair  States she is having some pain to her back

## 2024-01-04 ENCOUNTER — Emergency Department
Admission: EM | Admit: 2024-01-04 | Discharge: 2024-01-05 | Disposition: A | Discharge status: Home / Self Care | Attending: Emergency Medicine | Admitting: Emergency Medicine

## 2024-01-04 ENCOUNTER — Other Ambulatory Visit: Payer: Self-pay

## 2024-01-04 ENCOUNTER — Encounter: Payer: Self-pay | Admitting: Emergency Medicine

## 2024-01-04 DIAGNOSIS — R339 Retention of urine, unspecified: Secondary | ICD-10-CM | POA: Diagnosis not present

## 2024-01-04 DIAGNOSIS — F0390 Unspecified dementia without behavioral disturbance: Secondary | ICD-10-CM | POA: Diagnosis not present

## 2024-01-04 DIAGNOSIS — R0689 Other abnormalities of breathing: Secondary | ICD-10-CM | POA: Diagnosis not present

## 2024-01-04 DIAGNOSIS — I10 Essential (primary) hypertension: Secondary | ICD-10-CM | POA: Diagnosis not present

## 2024-01-04 LAB — URINALYSIS, W/ REFLEX TO CULTURE (INFECTION SUSPECTED)
Bilirubin Urine: NEGATIVE
Glucose, UA: NEGATIVE mg/dL
Ketones, ur: NEGATIVE mg/dL
Nitrite: NEGATIVE
Protein, ur: NEGATIVE mg/dL
Specific Gravity, Urine: 1.015 (ref 1.005–1.030)
WBC, UA: >50 WBC/hpf (ref 0–5)
pH: 5 (ref 5.0–8.0)

## 2024-01-04 MED ORDER — CEPHALEXIN 500 MG PO CAPS
500.0000 mg | ORAL_CAPSULE | Freq: Once | ORAL | Status: AC
  Administered 2024-01-04: 500 mg via ORAL
  Filled 2024-01-04: qty 1

## 2024-01-04 MED ORDER — CEPHALEXIN 500 MG PO CAPS: 500.0000 mg | ORAL_CAPSULE | Freq: Two times a day (BID) | ORAL | 0 refills | Status: AC

## 2024-01-04 NOTE — ED Notes (Signed)
 16 FR FOLEY CATH INSERTED WITH DIFF.  UA TO LAB.  PT TOLERATED WELL.  APPROX 500CC URINE IN BAG.  MD AWARE.

## 2024-01-04 NOTE — ED Triage Notes (Signed)
 Pt in via [facility] from [facility] [place] where it was reported that she has not urinated all day.  After speaking w/ the RN who took care of her on yesterdays ER visit, he clarifies that she did not have a urinary catheter in place but the daughter did mention that she had one removed recently.  Patient is unable to tell me why she is here, patient currently A/O to self only.  Hx of dementia at baseline.

## 2024-01-04 NOTE — ED Provider Triage Note (Signed)
 Emergency Medicine Provider Triage Evaluation Note  Audrey Duran , a 89 y.o. female  was evaluated in triage.  Pt complains of urinary retention. Patient was seen by me in the ED yesterday for a fall and also had a UTI. Patient has not had any antibiotics.  Patient has dementia and cannot provide any history. See in her chart that patient was documented to have a foley on 5/31, there was no catheter removal documented. Patient did not have a catheter in place when she was seen yesterday and was able to provide a urine sample.  Review of Systems  Positive:  Negative:   Physical Exam  BP 134/71 (BP Location: Left Arm)   Pulse 93   Temp 98.2 F (36.8 C) (Oral)   Resp 16   SpO2 95%  Gen:   Awake, no distress   Resp:  Normal effort  MSK:   Moves extremities without difficulty  Other:    Medical Decision Making  Medically screening exam initiated at 7:19 PM.  Appropriate orders placed.  Audrey Duran was informed that the remainder of the evaluation will be completed by another provider, this initial triage assessment does not replace that evaluation, and the importance of remaining in the ED until their evaluation is complete.     Phyliss Breen, PA-C 01/04/24 1923

## 2024-01-04 NOTE — Discharge Instructions (Addendum)
 We placed a Foley catheter as you are once again retaining urine.  I have included a written prescription for antibiotics in your papers.  Please arrange follow-up with urology for further evaluation of your urinary retention.  Return to the ER for new or worsening symptoms.

## 2024-01-04 NOTE — ED Provider Notes (Signed)
 [facility] Provider Note    Event Date/Time   First MD Initiated Contact with Patient 01/04/24 2120     (approximate)   History   Urinary Retention   HPI  Audrey Duran is a 89 year old female presenting to the emergency department for evaluation of urinary retention.  Patient with history of dementia, unable to provide history.  She denies any pain.  Collateral history obtained from patient's facility.  They report that the patient has not voided since yesterday around 3 PM.  They report that she recently has had a prolonged Foley catheter for at least a month.  They are unsure exactly when this was removed.  Patient otherwise has been at her baseline.     Physical Exam   Triage Vital Signs: ED Triage Vitals [01/04/24 1912]  Encounter Vitals Group     BP 134/71     Systolic BP Percentile      Diastolic BP Percentile      Pulse Rate 93     Resp 16     Temp 98.2 F (36.8 C)     Temp Source Oral     SpO2 95 %     Weight      Height      Head Circumference      Peak Flow      Pain Score      Pain Loc      Pain Education      Exclude from Growth Chart     Most recent vital signs: Vitals:   01/04/24 1912  BP: 134/71  Pulse: 93  Resp: 16  Temp: 98.2 F (36.8 C)  SpO2: 95%     General: Awake, interactive  CV:  Regular rate, good peripheral perfusion.  Resp:  Unlabored respirations.  Abd:  Nondistended, soft, no appreciable tenderness to palpation Neuro:  Symmetric facial movement, fluid speech   ED Results / Procedures / Treatments   Labs (all labs ordered are listed, but only abnormal results are displayed) Labs Reviewed  URINALYSIS, W/ REFLEX TO CULTURE (INFECTION SUSPECTED) - Abnormal; Notable for the following components:      Result Value   Color, Urine YELLOW (*)    APPearance HAZY (*)    Hgb urine dipstick SMALL (*)    Leukocytes,Ua LARGE (*)    Bacteria, UA MANY (*)    All other components within normal limits   URINE CULTURE     EKG EKG independently reviewed and interpreted by myself demonstrates:    RADIOLOGY Imaging independently reviewed and interpreted by myself demonstrates:   Formal Radiology Read:  No results found.  PROCEDURES:  Critical Care performed: No  Procedures   MEDICATIONS ORDERED IN ED: Medications  cephALEXin (KEFLEX) capsule 500 mg (has no administration in time range)     IMPRESSION / MDM / ASSESSMENT AND PLAN / ED COURSE  I reviewed the triage vital signs and the nursing notes.  Differential diagnosis includes, but is not limited to, recurrent urinary retention, lower suspicion acute intra-abdominal process given reassuring abdominal exam, UTI  Patient's presentation is most consistent with acute complicated illness / injury requiring diagnostic workup.  89 year old female presenting to the emergency department with inability to void.  Bladder scan here around 300 cc.  Given history of recurrent urinary retention, we will go ahead and place Foley catheter.  Will send urinalysis, plan for antibiotics if evidence of infection.  2235 Foley catheter placed with return of about 500 cc of urine.  2246 Urinalysis is concerning for infection.  Urine culture sent.  Will DC on empiric antibiotics with Keflex.  Information was given for follow-up with urology given her recurrent urinary retention.  Strict return precautions provided.  Patient discharged in stable condition.     FINAL CLINICAL IMPRESSION(S) / ED DIAGNOSES   Final diagnoses:  Urinary retention     Rx / DC Orders   ED Discharge Orders          Ordered    cephALEXin (KEFLEX) 500 MG capsule  2 times daily        01/04/24 2304             Note:  This document was prepared using Dragon voice recognition software and may include unintentional dictation errors.   Claria Crofts, MD 01/04/24 [phone]

## 2024-01-04 NOTE — ED Triage Notes (Signed)
 First Nurse Note: Patient to ED via [facility] from the [facility] [place]. Dc'd from here yesterday after having foley catheter removed. Has not urinated since. Has not had antibiotic as prescribed. Hx dementia.   174/81 82 HR 25 rr 93% RA

## 2024-01-05 DIAGNOSIS — Z7401 Bed confinement status: Secondary | ICD-10-CM | POA: Diagnosis not present

## 2024-01-05 DIAGNOSIS — R404 Transient alteration of awareness: Secondary | ICD-10-CM | POA: Diagnosis not present

## 2024-01-05 NOTE — ED Notes (Addendum)
 [facility] here for transport to the [facility]    no one answered phone at the [facility] for report

## 2024-01-06 ENCOUNTER — Ambulatory Visit: Admitting: Urology

## 2024-01-06 ENCOUNTER — Ambulatory Visit: Admitting: Physician Assistant

## 2024-01-06 LAB — URINE CULTURE: Culture: >=100000 — AB

## 2024-01-07 DIAGNOSIS — D519 Vitamin B12 deficiency anemia, unspecified: Secondary | ICD-10-CM | POA: Diagnosis not present

## 2024-01-07 DIAGNOSIS — J449 Chronic obstructive pulmonary disease, unspecified: Secondary | ICD-10-CM | POA: Diagnosis not present

## 2024-01-07 DIAGNOSIS — E038 Other specified hypothyroidism: Secondary | ICD-10-CM | POA: Diagnosis not present

## 2024-01-07 DIAGNOSIS — E782 Mixed hyperlipidemia: Secondary | ICD-10-CM | POA: Diagnosis not present

## 2024-01-07 DIAGNOSIS — E559 Vitamin D deficiency, unspecified: Secondary | ICD-10-CM | POA: Diagnosis not present

## 2024-01-07 DIAGNOSIS — I10 Essential (primary) hypertension: Secondary | ICD-10-CM | POA: Diagnosis not present

## 2024-01-07 DIAGNOSIS — E119 Type 2 diabetes mellitus without complications: Secondary | ICD-10-CM | POA: Diagnosis not present

## 2024-01-07 DIAGNOSIS — I70223 Atherosclerosis of native arteries of extremities with rest pain, bilateral legs: Secondary | ICD-10-CM | POA: Diagnosis not present

## 2024-01-10 DIAGNOSIS — N390 Urinary tract infection, site not specified: Secondary | ICD-10-CM | POA: Diagnosis not present

## 2024-01-10 DIAGNOSIS — R296 Repeated falls: Secondary | ICD-10-CM | POA: Diagnosis not present

## 2024-01-10 DIAGNOSIS — R339 Retention of urine, unspecified: Secondary | ICD-10-CM | POA: Diagnosis not present

## 2024-01-10 DIAGNOSIS — S32010A Wedge compression fracture of first lumbar vertebra, initial encounter for closed fracture: Secondary | ICD-10-CM | POA: Diagnosis not present

## 2024-01-10 DIAGNOSIS — J189 Pneumonia, unspecified organism: Secondary | ICD-10-CM | POA: Diagnosis not present

## 2024-01-18 ENCOUNTER — Telehealth: Payer: Self-pay

## 2024-01-18 NOTE — Telephone Encounter (Signed)
 Spoke with pt's daughter to follow up on whether pt was still wanting to be under Dr. Lula care. Daughter stated that she is unable to bring pt to the doctor anymore so she stated pt is now under hospice care.

## 2024-01-21 ENCOUNTER — Other Ambulatory Visit: Payer: Self-pay

## 2024-01-21 DIAGNOSIS — S32010A Wedge compression fracture of first lumbar vertebra, initial encounter for closed fracture: Secondary | ICD-10-CM

## 2024-01-21 DIAGNOSIS — S22080A Wedge compression fracture of T11-T12 vertebra, initial encounter for closed fracture: Secondary | ICD-10-CM

## 2024-01-21 NOTE — Progress Notes (Deleted)
 Referring Physician:  Marylynn Verneita CROME, MD [address] [place],  [place] [address]  Primary Physician:  No primary care provider on file.  History of Present Illness: 01/21/2024 Ms. Audrey Audrey Duran is here today with a chief complaint of ***  She presented to the ER on 01/03/24 after having a fall and a has a lumbar fracture  Wearing brace?  Past Surgery: ***no spinal surgeries  Audrey Audrey Duran has ***no symptoms of cervical myelopathy.  The symptoms are causing a significant impact on the patient's life.   Review of Systems:  A 10 point review of systems is negative, except for the pertinent positives and negatives detailed in the HPI.  Past Medical History: Past Medical History:  Diagnosis Date   Anemia    Atrial fibrillation (HCC)    COPD (chronic obstructive pulmonary disease) (HCC)    Coronary artery disease 1995   s/p AMI , no history of stents,  Audrey Audrey Duran   Hyperlipidemia    Ischemic cardiomyopathy Dec 2011   ETT Sestamibi study apical scar, no ischemia, Audrey Audrey Duran   Myocardial infarction (HCC)    Recurrent HSV (herpes simplex virus)     Past Surgical History: Past Surgical History:  Procedure Laterality Date   ABDOMINAL HYSTERECTOMY  june 2014   BREAST EXCISIONAL BIOPSY Left 1990's   neg   Audrey Audrey Duran  July 2007   Audrey Audrey Duran, Audrey Audrey Duran   CATARACT EXTRACTION  2004, 2006   JOINT REPLACEMENT  July 2013   total hip , Audrey Audrey Duran   LAPAROSCOPIC APPENDECTOMY N/A 02/20/2020   Procedure: APPENDECTOMY LAPAROSCOPIC;  Surgeon: Audrey Laneta FALCON, MD;  Location: [facility] ORS;  Service: General;  Laterality: N/A;    Allergies: Allergies as of 01/24/2024 - Reviewed 01/04/2024  Allergen Reaction Noted   Azithromycin  07/31/2011   Clarithromycin  07/31/2011   Diclofenac  07/31/2011   Lisinopril  Other (See Comments) 10/28/2013   Meloxicam  07/31/2011   Nitrofurantoin monohyd macro  07/31/2011   Zocor [simvastatin]  07/31/2011   Penicillins Rash  04/13/2015    Medications: Outpatient Encounter Medications as of 01/24/2024  Medication Sig   acetaminophen  (TYLENOL ) 325 MG tablet Take by mouth every 4 (four) hours as needed.   alendronate  (FOSAMAX ) 70 MG tablet Take 1 tablet by mouth every 7 days on an empty stomach with a full glass of water   alum & mag hydroxide-simeth (MAALOX/MYLANTA) 200-200-20 MG/5ML suspension Take 30 mLs by mouth every 6 (six) hours as needed for indigestion or heartburn.   aspirin  81 MG tablet Take 81 mg by mouth at bedtime.   cefUROXime  (CEFTIN ) 250 MG tablet Take 1 tablet (250 mg total) by mouth 2 (two) times daily with a meal.   Cholecalciferol  (VITAMIN D3) 75 MCG (3000 UT) TABS Take 1 tablet by mouth daily. Take one by mouth daily   citalopram  (CELEXA ) 20 MG tablet Take 1 tablet (20 mg total) by mouth daily.   Ferrous Sulfate  (IRON) 325 (65 Fe) MG TABS Take 1 tablet by mouth daily.   guaifenesin  (ROBITUSSIN) 100 MG/5ML syrup Take 300 mg by mouth 4 (four) times daily as needed for cough.   lidocaine  (XYLOCAINE ) 5 % ointment Apply 1 Application topically as needed.   loperamide (IMODIUM) 2 MG capsule Take 4 mg by mouth as needed for diarrhea or loose stools.   LORazepam  (ATIVAN ) 0.5 MG tablet Take 0.5 mg by mouth every 6 (six) hours as needed for anxiety (NOT TO EXCEED TWO DOSES IN 24 HOURS).   magnesium  hydroxide (  MILK OF MAGNESIA) 400 MG/5ML suspension Take 30 mLs by mouth daily as needed for mild constipation.   magnesium  oxide (MAG-OX) 400 MG tablet Take 400 mg by mouth daily.   midodrine  (PROAMATINE ) 2.5 MG tablet Take 1 tablet (2.5 mg total) by mouth 2 (two) times daily with a meal.   mirtazapine  (REMERON ) 7.5 MG tablet Take 1 tablet (7.5 mg total) by mouth at bedtime.   risperiDONE  (RISPERDAL ) 0.25 MG tablet Take 1 tablet (0.25 mg total) by mouth at bedtime. (Patient taking differently: Take 0.25 mg by mouth 2 (two) times daily.)   rosuvastatin  (CRESTOR ) 20 MG tablet Take 1 tablet (20 mg total) by mouth  daily. (Patient taking differently: Take 20 mg by mouth at bedtime.)   senna (SENOKOT) 8.6 MG TABS tablet Take 2 tablets by mouth daily.   timolol  (TIMOPTIC ) 0.5 % ophthalmic solution Place 1 drop into both eyes 2 (two) times daily.   No facility-administered encounter medications on file as of 01/24/2024.    Social History: Social History   Tobacco Use   Smoking status: Former    Current packs/day: 0.00    Types: Cigarettes    Quit date: 01/13/1992    Years since quitting: 32.0   Smokeless tobacco: Never  Substance Use Topics   Alcohol use: No    Alcohol/week: 0.0 standard drinks of alcohol   Drug use: No    Family Medical History: Family History  Problem Relation Age of Onset   Cancer Mother 48       Pancreatic    Heart disease Father    Cancer Sister 57       Breast Cancer   Breast cancer Sister 61   Multiple sclerosis Daughter     Physical Examination: @VITALWITHPAIN @  General: Patient is well developed, well nourished, calm, collected, and in no apparent distress. Attention to examination is appropriate.  Psychiatric: Patient is non-anxious.  Head:  Pupils equal, round, and reactive to light.  ENT:  Oral mucosa appears well hydrated.  Neck:   Supple.  ***Full range of motion.  Respiratory: Patient is breathing without any difficulty.  Extremities: No edema.  Vascular: Palpable dorsal pedal pulses.  Skin:   On exposed skin, there are no abnormal skin lesions.  NEUROLOGICAL:     Awake, alert, oriented to person, place, and time.  Speech is clear and fluent. Fund of knowledge is appropriate.   Cranial Nerves: Pupils equal round and reactive to light.  Facial tone is symmetric.  Facial sensation is symmetric.  ROM of spine: ***full.  Palpation of spine: ***non tender.    Strength: Side Biceps Triceps Deltoid Interossei Grip Wrist Ext. Wrist Flex.  R 5 5 5 5 5 5 5   L 5 5 5 5 5 5 5    Side Iliopsoas Quads Hamstring PF DF EHL  R 5 5 5 5 5 5   L 5 5 5 5  5 5    Reflexes are ***2+ and symmetric at the biceps, triceps, brachioradialis, patella and achilles.   Hoffman's is absent.  Clonus is not present.  Toes are down-going.  Bilateral upper and lower extremity sensation is intact to light touch.    Gait is normal.   No difficulty with tandem gait.   No evidence of dysmetria noted.  Medical Decision Making  Imaging: ***  I have personally reviewed the images and agree with the above interpretation.  Assessment and Plan: Ms. Labrecque is a pleasant 89 y.o. female with ***    Thank you  for involving me in the care of this patient.   I spent a total of *** minutes in both face-to-face and non-face-to-face activities for this visit on the date of this encounter.   Lyle Decamp, PA-C Dept. of Neurosurgery

## 2024-01-23 DIAGNOSIS — K921 Melena: Secondary | ICD-10-CM | POA: Diagnosis not present

## 2024-01-24 ENCOUNTER — Ambulatory Visit: Admitting: Physician Assistant

## 2024-01-24 DIAGNOSIS — K649 Unspecified hemorrhoids: Secondary | ICD-10-CM | POA: Diagnosis not present

## 2024-01-24 DIAGNOSIS — K921 Melena: Secondary | ICD-10-CM | POA: Diagnosis not present

## 2024-02-07 DIAGNOSIS — R339 Retention of urine, unspecified: Secondary | ICD-10-CM | POA: Diagnosis not present

## 2024-02-07 DIAGNOSIS — E46 Unspecified protein-calorie malnutrition: Secondary | ICD-10-CM | POA: Diagnosis not present

## 2024-02-07 DIAGNOSIS — M19012 Primary osteoarthritis, left shoulder: Secondary | ICD-10-CM | POA: Diagnosis not present

## 2024-02-07 DIAGNOSIS — J449 Chronic obstructive pulmonary disease, unspecified: Secondary | ICD-10-CM | POA: Diagnosis not present

## 2024-02-07 DIAGNOSIS — S32010A Wedge compression fracture of first lumbar vertebra, initial encounter for closed fracture: Secondary | ICD-10-CM | POA: Diagnosis not present

## 2024-02-07 DIAGNOSIS — E785 Hyperlipidemia, unspecified: Secondary | ICD-10-CM | POA: Diagnosis not present

## 2024-02-07 DIAGNOSIS — R54 Age-related physical debility: Secondary | ICD-10-CM | POA: Diagnosis not present

## 2024-02-07 DIAGNOSIS — M549 Dorsalgia, unspecified: Secondary | ICD-10-CM | POA: Diagnosis not present

## 2024-02-07 DIAGNOSIS — R627 Adult failure to thrive: Secondary | ICD-10-CM | POA: Diagnosis not present

## 2024-02-07 DIAGNOSIS — I480 Paroxysmal atrial fibrillation: Secondary | ICD-10-CM | POA: Diagnosis not present

## 2024-02-07 DIAGNOSIS — L89303 Pressure ulcer of unspecified buttock, stage 3: Secondary | ICD-10-CM | POA: Diagnosis not present

## 2024-02-07 DIAGNOSIS — I959 Hypotension, unspecified: Secondary | ICD-10-CM | POA: Diagnosis not present

## 2024-02-25 DEATH — deceased

## 2024-04-13 ENCOUNTER — Other Ambulatory Visit (HOSPITAL_COMMUNITY): Payer: Self-pay

## 2024-04-27 ENCOUNTER — Other Ambulatory Visit: Payer: Self-pay

## 2024-06-14 NOTE — Telephone Encounter (Signed)
 open in error
